# Patient Record
Sex: Male | Born: 1950 | Race: White | Hispanic: No | Marital: Married | Smoking: Former smoker
Health system: Southern US, Community
[De-identification: ages and names within clinical notes are randomized; demographics above are authoritative.]

## PROBLEM LIST (undated history)

## (undated) DIAGNOSIS — Z87898 Personal history of other specified conditions: Secondary | ICD-10-CM

## (undated) DIAGNOSIS — G629 Polyneuropathy, unspecified: Secondary | ICD-10-CM

## (undated) DIAGNOSIS — N183 Chronic kidney disease, stage 3 unspecified: Secondary | ICD-10-CM

## (undated) DIAGNOSIS — I10 Essential (primary) hypertension: Secondary | ICD-10-CM

## (undated) DIAGNOSIS — M1990 Unspecified osteoarthritis, unspecified site: Secondary | ICD-10-CM

## (undated) DIAGNOSIS — D649 Anemia, unspecified: Secondary | ICD-10-CM

## (undated) DIAGNOSIS — M549 Dorsalgia, unspecified: Secondary | ICD-10-CM

## (undated) DIAGNOSIS — I2510 Atherosclerotic heart disease of native coronary artery without angina pectoris: Secondary | ICD-10-CM

## (undated) DIAGNOSIS — Z972 Presence of dental prosthetic device (complete) (partial): Secondary | ICD-10-CM

## (undated) DIAGNOSIS — I739 Peripheral vascular disease, unspecified: Secondary | ICD-10-CM

## (undated) DIAGNOSIS — E785 Hyperlipidemia, unspecified: Secondary | ICD-10-CM

## (undated) DIAGNOSIS — I5189 Other ill-defined heart diseases: Secondary | ICD-10-CM

## (undated) HISTORY — PX: SHOULDER SURGERY: SHX246

## (undated) HISTORY — DX: Chronic kidney disease, stage 3 unspecified: N18.30

## (undated) HISTORY — DX: Personal history of other specified conditions: Z87.898

## (undated) HISTORY — PX: PROSTATE SURGERY: SHX751

## (undated) HISTORY — DX: Hyperlipidemia, unspecified: E78.5

## (undated) HISTORY — PX: VASECTOMY: SHX75

## (undated) HISTORY — DX: Atherosclerotic heart disease of native coronary artery without angina pectoris: I25.10

## (undated) HISTORY — DX: Peripheral vascular disease, unspecified: I73.9

## (undated) HISTORY — PX: BASAL CELL CARCINOMA EXCISION: SHX1214

## (undated) HISTORY — DX: Other ill-defined heart diseases: I51.89

---

## 2010-06-16 ENCOUNTER — Ambulatory Visit: Payer: Self-pay | Admitting: Family Medicine

## 2011-12-22 ENCOUNTER — Ambulatory Visit: Payer: Self-pay | Admitting: Family Medicine

## 2012-01-04 ENCOUNTER — Other Ambulatory Visit: Payer: Self-pay | Admitting: Neurosurgery

## 2012-01-05 ENCOUNTER — Encounter (HOSPITAL_COMMUNITY): Payer: Self-pay | Admitting: Respiratory Therapy

## 2012-01-07 ENCOUNTER — Ambulatory Visit (HOSPITAL_COMMUNITY)
Admission: RE | Admit: 2012-01-07 | Discharge: 2012-01-07 | Disposition: A | Discharge status: Home / Self Care | Payer: BC Managed Care – PPO | Source: Ambulatory Visit | Attending: Neurosurgery | Admitting: Neurosurgery

## 2012-01-07 ENCOUNTER — Other Ambulatory Visit: Payer: Self-pay

## 2012-01-07 ENCOUNTER — Encounter (HOSPITAL_COMMUNITY): Payer: Self-pay

## 2012-01-07 ENCOUNTER — Encounter (HOSPITAL_COMMUNITY)
Admission: RE | Admit: 2012-01-07 | Discharge: 2012-01-07 | Disposition: A | Discharge status: Home / Self Care | Payer: BC Managed Care – PPO | Source: Ambulatory Visit | Attending: Neurosurgery | Admitting: Neurosurgery

## 2012-01-07 DIAGNOSIS — J449 Chronic obstructive pulmonary disease, unspecified: Secondary | ICD-10-CM | POA: Insufficient documentation

## 2012-01-07 DIAGNOSIS — Z01818 Encounter for other preprocedural examination: Secondary | ICD-10-CM | POA: Insufficient documentation

## 2012-01-07 DIAGNOSIS — J4489 Other specified chronic obstructive pulmonary disease: Secondary | ICD-10-CM | POA: Insufficient documentation

## 2012-01-07 DIAGNOSIS — Z01812 Encounter for preprocedural laboratory examination: Secondary | ICD-10-CM | POA: Insufficient documentation

## 2012-01-07 DIAGNOSIS — Z01810 Encounter for preprocedural cardiovascular examination: Secondary | ICD-10-CM | POA: Insufficient documentation

## 2012-01-07 HISTORY — DX: Essential (primary) hypertension: I10

## 2012-01-07 HISTORY — DX: Dorsalgia, unspecified: M54.9

## 2012-01-07 HISTORY — DX: Polyneuropathy, unspecified: G62.9

## 2012-01-07 LAB — CBC
HCT: 40.8 % (ref 39.0–52.0)
Hemoglobin: 14.4 g/dL (ref 13.0–17.0)
MCH: 31.3 pg (ref 26.0–34.0)
MCHC: 35.3 g/dL (ref 30.0–36.0)
MCV: 88.7 fL (ref 78.0–100.0)
Platelets: 178 10*3/uL (ref 150–400)
RBC: 4.6 MIL/uL (ref 4.22–5.81)
RDW: 12.9 % (ref 11.5–15.5)
WBC: 9.1 10*3/uL (ref 4.0–10.5)

## 2012-01-07 LAB — SURGICAL PCR SCREEN
MRSA, PCR: NEGATIVE
Staphylococcus aureus: NEGATIVE

## 2012-01-07 LAB — BASIC METABOLIC PANEL
BUN: 26 mg/dL — ABNORMAL HIGH (ref 6–23)
CO2: 23 mEq/L (ref 19–32)
Calcium: 9.6 mg/dL (ref 8.4–10.5)
Chloride: 100 mEq/L (ref 96–112)
Creatinine, Ser: 0.96 mg/dL (ref 0.50–1.35)
GFR calc Af Amer: >90 mL/min (ref >90)
GFR calc non Af Amer: 88 mL/min — ABNORMAL LOW (ref >90)
Glucose, Bld: 171 mg/dL — ABNORMAL HIGH (ref 70–99)
Potassium: 4.8 mEq/L (ref 3.5–5.1)
Sodium: 134 mEq/L — ABNORMAL LOW (ref 135–145)

## 2012-01-07 NOTE — Pre-Procedure Instructions (Signed)
[place]  01/07/2012   Your procedure is scheduled on:  March 4  Report to [place] at 0900 AM.  Call this number if you have problems the morning of surgery: [phone]   Remember:   Do not eat food:After Midnight.  May have clear liquids: up to 4 Hours before arrival.  Clear liquids include soda, tea, black coffee, apple or grape juice, broth.  Take these medicines the morning of surgery with A SIP OF WATER: none   Do not wear jewelry, make-up or nail polish.  Do not wear lotions, powders, or perfumes. You may wear deodorant.  Do not shave 48 hours prior to surgery.  Do not bring valuables to the hospital.  Contacts, dentures or bridgework may not be worn into surgery.  Leave suitcase in the car. After surgery it may be brought to your room.  For patients admitted to the hospital, checkout time is 11:00 AM the day of discharge.   Patients discharged the day of surgery will not be allowed to drive home.  Name and phone number of your driver: spouse  Special Instructions: CHG Shower Use Special Wash: 1/2 bottle night before surgery and 1/2 bottle morning of surgery.   Please read over the following fact sheets that you were given: MRSA Information and Surgical Site Infection Prevention

## 2012-01-09 MED ORDER — CEFAZOLIN SODIUM 1-5 GM-% IV SOLN: 1.0000 g | INTRAVENOUS | Status: DC

## 2012-01-10 ENCOUNTER — Encounter (HOSPITAL_COMMUNITY): Payer: Self-pay | Admitting: *Deleted

## 2012-01-10 ENCOUNTER — Encounter (HOSPITAL_COMMUNITY)
Admission: RE | Disposition: A | Discharge status: Home / Self Care | Payer: Self-pay | Source: Ambulatory Visit | Attending: Neurosurgery

## 2012-01-10 ENCOUNTER — Ambulatory Visit (HOSPITAL_COMMUNITY)
Admission: RE | Admit: 2012-01-10 | Discharge: 2012-01-11 | Disposition: A | Discharge status: Home / Self Care | Payer: BC Managed Care – PPO | Source: Ambulatory Visit | Attending: Neurosurgery | Admitting: Neurosurgery

## 2012-01-10 ENCOUNTER — Ambulatory Visit (HOSPITAL_COMMUNITY): Payer: BC Managed Care – PPO | Admitting: *Deleted

## 2012-01-10 ENCOUNTER — Ambulatory Visit (HOSPITAL_COMMUNITY): Payer: BC Managed Care – PPO

## 2012-01-10 DIAGNOSIS — Z01818 Encounter for other preprocedural examination: Secondary | ICD-10-CM | POA: Insufficient documentation

## 2012-01-10 DIAGNOSIS — Z79899 Other long term (current) drug therapy: Secondary | ICD-10-CM | POA: Insufficient documentation

## 2012-01-10 DIAGNOSIS — M47817 Spondylosis without myelopathy or radiculopathy, lumbosacral region: Secondary | ICD-10-CM | POA: Insufficient documentation

## 2012-01-10 DIAGNOSIS — E119 Type 2 diabetes mellitus without complications: Secondary | ICD-10-CM | POA: Insufficient documentation

## 2012-01-10 DIAGNOSIS — G5780 Other specified mononeuropathies of unspecified lower limb: Secondary | ICD-10-CM | POA: Insufficient documentation

## 2012-01-10 DIAGNOSIS — M5137 Other intervertebral disc degeneration, lumbosacral region: Secondary | ICD-10-CM | POA: Insufficient documentation

## 2012-01-10 DIAGNOSIS — Z01812 Encounter for preprocedural laboratory examination: Secondary | ICD-10-CM | POA: Insufficient documentation

## 2012-01-10 DIAGNOSIS — I10 Essential (primary) hypertension: Secondary | ICD-10-CM | POA: Insufficient documentation

## 2012-01-10 DIAGNOSIS — F17200 Nicotine dependence, unspecified, uncomplicated: Secondary | ICD-10-CM | POA: Insufficient documentation

## 2012-01-10 DIAGNOSIS — M51379 Other intervertebral disc degeneration, lumbosacral region without mention of lumbar back pain or lower extremity pain: Secondary | ICD-10-CM | POA: Insufficient documentation

## 2012-01-10 DIAGNOSIS — R339 Retention of urine, unspecified: Secondary | ICD-10-CM | POA: Insufficient documentation

## 2012-01-10 DIAGNOSIS — Z01810 Encounter for preprocedural cardiovascular examination: Secondary | ICD-10-CM | POA: Insufficient documentation

## 2012-01-10 HISTORY — PX: LUMBAR LAMINECTOMY/DECOMPRESSION MICRODISCECTOMY: SHX5026

## 2012-01-10 LAB — GLUCOSE, CAPILLARY
Glucose-Capillary: 198 mg/dL — ABNORMAL HIGH (ref 70–99)
Glucose-Capillary: 141 mg/dL — ABNORMAL HIGH (ref 70–99)

## 2012-01-10 SURGERY — LUMBAR LAMINECTOMY/DECOMPRESSION MICRODISCECTOMY 2 LEVELS
Anesthesia: General | Site: Spine Lumbar | Laterality: Bilateral | Wound class: Clean

## 2012-01-10 MED ORDER — SODIUM CHLORIDE 0.9 % IV SOLN
INTRAVENOUS | Status: AC
  Filled 2012-01-10: qty 500

## 2012-01-10 MED ORDER — INSULIN ASPART 100 UNIT/ML ~~LOC~~ SOLN
0.0000 [IU] | Freq: Every day | SUBCUTANEOUS | Status: DC
  Administered 2012-01-10: 2 [IU] via SUBCUTANEOUS

## 2012-01-10 MED ORDER — ONDANSETRON HCL 4 MG/2ML IJ SOLN: 4.0000 mg | Freq: Once | INTRAMUSCULAR | Status: DC | PRN

## 2012-01-10 MED ORDER — LIDOCAINE-EPINEPHRINE 1 %-1:100000 IJ SOLN
INTRAMUSCULAR | Status: DC | PRN
  Administered 2012-01-10: 10 mL

## 2012-01-10 MED ORDER — MENTHOL 3 MG MT LOZG: 1.0000 | LOZENGE | OROMUCOSAL | Status: DC | PRN

## 2012-01-10 MED ORDER — THROMBIN 5000 UNITS EX KIT
PACK | CUTANEOUS | Status: DC | PRN
  Administered 2012-01-10 (×2): 5000 [IU] via TOPICAL

## 2012-01-10 MED ORDER — HYDROCODONE-ACETAMINOPHEN 5-325 MG PO TABS: 1.0000 | ORAL_TABLET | ORAL | Status: DC | PRN

## 2012-01-10 MED ORDER — GLYCOPYRROLATE 0.2 MG/ML IJ SOLN
INTRAMUSCULAR | Status: DC | PRN
  Administered 2012-01-10: .7 mg via INTRAVENOUS

## 2012-01-10 MED ORDER — PHENOL 1.4 % MT LIQD: 1.0000 | OROMUCOSAL | Status: DC | PRN

## 2012-01-10 MED ORDER — HYDROMORPHONE HCL PF 1 MG/ML IJ SOLN
INTRAMUSCULAR | Status: AC
  Filled 2012-01-10: qty 1

## 2012-01-10 MED ORDER — SODIUM CHLORIDE 0.9 % IJ SOLN: 3.0000 mL | INTRAMUSCULAR | Status: DC | PRN

## 2012-01-10 MED ORDER — LINAGLIPTIN 5 MG PO TABS
5.0000 mg | ORAL_TABLET | Freq: Every day | ORAL | Status: DC
  Administered 2012-01-10 – 2012-01-11 (×2): 5 mg via ORAL
  Filled 2012-01-10 (×2): qty 1

## 2012-01-10 MED ORDER — ONDANSETRON HCL 4 MG/2ML IJ SOLN
INTRAMUSCULAR | Status: DC | PRN
  Administered 2012-01-10: 4 mg via INTRAVENOUS

## 2012-01-10 MED ORDER — ALBUTEROL SULFATE (2.5 MG/3ML) 0.083% IN NEBU
INHALATION_SOLUTION | RESPIRATORY_TRACT | Status: DC | PRN
  Administered 2012-01-10: 2.5 mg via RESPIRATORY_TRACT
  Administered 2012-01-10: 1 mg via RESPIRATORY_TRACT

## 2012-01-10 MED ORDER — POTASSIUM CHLORIDE IN NACL 20-0.9 MEQ/L-% IV SOLN
INTRAVENOUS | Status: DC
  Filled 2012-01-10 (×4): qty 1000

## 2012-01-10 MED ORDER — INSULIN ASPART 100 UNIT/ML ~~LOC~~ SOLN
0.0000 [IU] | Freq: Three times a day (TID) | SUBCUTANEOUS | Status: DC
  Administered 2012-01-10: 3 [IU] via SUBCUTANEOUS
  Administered 2012-01-11: 8 [IU] via SUBCUTANEOUS
  Administered 2012-01-11: 3 [IU] via SUBCUTANEOUS
  Filled 2012-01-10: qty 3

## 2012-01-10 MED ORDER — HYDROXYZINE HCL 25 MG PO TABS: 50.0000 mg | ORAL_TABLET | ORAL | Status: DC | PRN

## 2012-01-10 MED ORDER — ALUM & MAG HYDROXIDE-SIMETH 200-200-20 MG/5ML PO SUSP: 30.0000 mL | Freq: Four times a day (QID) | ORAL | Status: DC | PRN

## 2012-01-10 MED ORDER — ROCURONIUM BROMIDE 100 MG/10ML IV SOLN
INTRAVENOUS | Status: DC | PRN
  Administered 2012-01-10: 50 mg via INTRAVENOUS

## 2012-01-10 MED ORDER — ZOLPIDEM TARTRATE 5 MG PO TABS: 10.0000 mg | ORAL_TABLET | Freq: Every evening | ORAL | Status: DC | PRN

## 2012-01-10 MED ORDER — LACTATED RINGERS IV SOLN
INTRAVENOUS | Status: DC | PRN
  Administered 2012-01-10 (×3): via INTRAVENOUS

## 2012-01-10 MED ORDER — MAGNESIUM HYDROXIDE 400 MG/5ML PO SUSP: 30.0000 mL | Freq: Every day | ORAL | Status: DC | PRN

## 2012-01-10 MED ORDER — MIDAZOLAM HCL 5 MG/5ML IJ SOLN
INTRAMUSCULAR | Status: DC | PRN
  Administered 2012-01-10: 2 mg via INTRAVENOUS

## 2012-01-10 MED ORDER — CYCLOBENZAPRINE HCL 10 MG PO TABS
10.0000 mg | ORAL_TABLET | Freq: Three times a day (TID) | ORAL | Status: DC | PRN
  Administered 2012-01-11: 10 mg via ORAL
  Filled 2012-01-10: qty 1

## 2012-01-10 MED ORDER — VECURONIUM BROMIDE 10 MG IV SOLR
INTRAVENOUS | Status: DC | PRN
  Administered 2012-01-10 (×2): 2 mg via INTRAVENOUS
  Administered 2012-01-10: 5 mg via INTRAVENOUS

## 2012-01-10 MED ORDER — BACITRACIN 50000 UNITS IM SOLR
INTRAMUSCULAR | Status: AC
  Filled 2012-01-10: qty 1

## 2012-01-10 MED ORDER — METFORMIN HCL 500 MG PO TABS
1000.0000 mg | ORAL_TABLET | Freq: Two times a day (BID) | ORAL | Status: DC
  Administered 2012-01-10 – 2012-01-11 (×3): 1000 mg via ORAL
  Filled 2012-01-10 (×5): qty 2

## 2012-01-10 MED ORDER — HYDROMORPHONE HCL PF 1 MG/ML IJ SOLN
0.2500 mg | INTRAMUSCULAR | Status: DC | PRN
  Administered 2012-01-10 (×2): 0.5 mg via INTRAVENOUS

## 2012-01-10 MED ORDER — ACETAMINOPHEN 10 MG/ML IV SOLN
INTRAVENOUS | Status: AC
  Administered 2012-01-10: 1000 mg via INTRAVENOUS
  Filled 2012-01-10: qty 100

## 2012-01-10 MED ORDER — ACETAMINOPHEN 650 MG RE SUPP: 650.0000 mg | RECTAL | Status: DC | PRN

## 2012-01-10 MED ORDER — MORPHINE SULFATE 2 MG/ML IJ SOLN: 0.0500 mg/kg | INTRAMUSCULAR | Status: DC | PRN

## 2012-01-10 MED ORDER — CEFAZOLIN SODIUM-DEXTROSE 2-3 GM-% IV SOLR
INTRAVENOUS | Status: AC
  Filled 2012-01-10: qty 50

## 2012-01-10 MED ORDER — OXYCODONE-ACETAMINOPHEN 5-325 MG PO TABS
1.0000 | ORAL_TABLET | ORAL | Status: DC | PRN
  Administered 2012-01-10 – 2012-01-11 (×3): 2 via ORAL
  Filled 2012-01-10 (×3): qty 2

## 2012-01-10 MED ORDER — HYDROXYZINE HCL 50 MG/ML IM SOLN
50.0000 mg | INTRAMUSCULAR | Status: DC | PRN
  Administered 2012-01-10: 50 mg via INTRAMUSCULAR
  Filled 2012-01-10: qty 1

## 2012-01-10 MED ORDER — KETOROLAC TROMETHAMINE 30 MG/ML IJ SOLN
30.0000 mg | Freq: Four times a day (QID) | INTRAMUSCULAR | Status: DC
  Administered 2012-01-10 – 2012-01-11 (×3): 30 mg via INTRAVENOUS
  Filled 2012-01-10 (×6): qty 1

## 2012-01-10 MED ORDER — KETOROLAC TROMETHAMINE 30 MG/ML IJ SOLN
INTRAMUSCULAR | Status: AC
  Administered 2012-01-10: 30 mg
  Filled 2012-01-10: qty 1

## 2012-01-10 MED ORDER — ENALAPRIL MALEATE 20 MG PO TABS
20.0000 mg | ORAL_TABLET | Freq: Two times a day (BID) | ORAL | Status: DC
  Administered 2012-01-11: 20 mg via ORAL
  Filled 2012-01-10 (×4): qty 1

## 2012-01-10 MED ORDER — FENTANYL CITRATE 0.05 MG/ML IJ SOLN
INTRAMUSCULAR | Status: DC | PRN
  Administered 2012-01-10 (×4): 50 ug via INTRAVENOUS
  Administered 2012-01-10: 150 ug via INTRAVENOUS
  Administered 2012-01-10: 50 ug via INTRAVENOUS

## 2012-01-10 MED ORDER — HEMOSTATIC AGENTS (NO CHARGE) OPTIME
TOPICAL | Status: DC | PRN
  Administered 2012-01-10: 1 via TOPICAL

## 2012-01-10 MED ORDER — KETOROLAC TROMETHAMINE 30 MG/ML IJ SOLN
30.0000 mg | Freq: Once | INTRAMUSCULAR | Status: AC
  Administered 2012-01-10: 30 mg via INTRAVENOUS

## 2012-01-10 MED ORDER — PROPOFOL 10 MG/ML IV EMUL
INTRAVENOUS | Status: DC | PRN
  Administered 2012-01-10: 120 mg via INTRAVENOUS

## 2012-01-10 MED ORDER — CEFAZOLIN SODIUM-DEXTROSE 2-3 GM-% IV SOLR
2.0000 g | INTRAVENOUS | Status: AC
  Administered 2012-01-10: 2 g via INTRAVENOUS

## 2012-01-10 MED ORDER — HYDROCHLOROTHIAZIDE 25 MG PO TABS
25.0000 mg | ORAL_TABLET | Freq: Every day | ORAL | Status: DC
  Administered 2012-01-10 – 2012-01-11 (×2): 25 mg via ORAL
  Filled 2012-01-10 (×2): qty 1

## 2012-01-10 MED ORDER — NEOSTIGMINE METHYLSULFATE 1 MG/ML IJ SOLN
INTRAMUSCULAR | Status: DC | PRN
  Administered 2012-01-10: 4 mg via INTRAVENOUS

## 2012-01-10 MED ORDER — SODIUM CHLORIDE 0.9 % IJ SOLN
3.0000 mL | Freq: Two times a day (BID) | INTRAMUSCULAR | Status: DC
  Administered 2012-01-10: 3 mL via INTRAVENOUS

## 2012-01-10 MED ORDER — GLIMEPIRIDE 4 MG PO TABS
4.0000 mg | ORAL_TABLET | Freq: Every day | ORAL | Status: DC
  Administered 2012-01-11: 4 mg via ORAL
  Filled 2012-01-10 (×2): qty 1

## 2012-01-10 MED ORDER — MORPHINE SULFATE 4 MG/ML IJ SOLN: 4.0000 mg | INTRAMUSCULAR | Status: DC | PRN

## 2012-01-10 MED ORDER — ACETAMINOPHEN 325 MG PO TABS: 650.0000 mg | ORAL_TABLET | ORAL | Status: DC | PRN

## 2012-01-10 MED ORDER — MEPERIDINE HCL 25 MG/ML IJ SOLN: 6.2500 mg | INTRAMUSCULAR | Status: DC | PRN

## 2012-01-10 MED ORDER — BUPIVACAINE HCL (PF) 0.5 % IJ SOLN
INTRAMUSCULAR | Status: DC | PRN
  Administered 2012-01-10: 10 mL

## 2012-01-10 MED ORDER — BACITRACIN 50000 UNITS IM SOLR
INTRAMUSCULAR | Status: DC | PRN
  Administered 2012-01-10: 13:00:00

## 2012-01-10 MED ORDER — 0.9 % SODIUM CHLORIDE (POUR BTL) OPTIME
TOPICAL | Status: DC | PRN
  Administered 2012-01-10: 1000 mL

## 2012-01-10 MED ORDER — BISACODYL 10 MG RE SUPP: 10.0000 mg | Freq: Every day | RECTAL | Status: DC | PRN

## 2012-01-10 SURGICAL SUPPLY — 62 items
BAG DECANTER FOR FLEXI CONT (MISCELLANEOUS) ×2 IMPLANT
BENZOIN TINCTURE PRP APPL 2/3 (GAUZE/BANDAGES/DRESSINGS) IMPLANT
BLADE SURG ROTATE 9660 (MISCELLANEOUS) IMPLANT
BRUSH SCRUB EZ PLAIN DRY (MISCELLANEOUS) ×2 IMPLANT
BUR ACORN 6.0 ACORN (BURR) IMPLANT
BUR ACRON 5.0MM COATED (BURR) ×2 IMPLANT
BUR MATCHSTICK NEURO 3.0 LAGG (BURR) ×2 IMPLANT
CANISTER SUCTION 2500CC (MISCELLANEOUS) ×2 IMPLANT
CLOTH BEACON ORANGE TIMEOUT ST (SAFETY) ×2 IMPLANT
CONT SPEC 4OZ CLIKSEAL STRL BL (MISCELLANEOUS) IMPLANT
DERMABOND ADVANCED (GAUZE/BANDAGES/DRESSINGS) ×2
DERMABOND ADVANCED .7 DNX12 (GAUZE/BANDAGES/DRESSINGS) ×2 IMPLANT
DRAPE LAPAROTOMY 100X72X124 (DRAPES) ×2 IMPLANT
DRAPE MICROSCOPE LEICA (MISCELLANEOUS) ×2 IMPLANT
DRAPE POUCH INSTRU U-SHP 10X18 (DRAPES) ×2 IMPLANT
DRSG EMULSION OIL 3X3 NADH (GAUZE/BANDAGES/DRESSINGS) IMPLANT
ELECT REM PT RETURN 9FT ADLT (ELECTROSURGICAL) ×2
ELECTRODE REM PT RTRN 9FT ADLT (ELECTROSURGICAL) ×1 IMPLANT
GAUZE SPONGE 4X4 12PLY STRL LF (GAUZE/BANDAGES/DRESSINGS) ×2 IMPLANT
GAUZE SPONGE 4X4 16PLY XRAY LF (GAUZE/BANDAGES/DRESSINGS) IMPLANT
GLOVE BIO SURGEON STRL SZ8.5 (GLOVE) ×2 IMPLANT
GLOVE BIOGEL PI IND STRL 7.5 (GLOVE) ×2 IMPLANT
GLOVE BIOGEL PI IND STRL 8 (GLOVE) ×1 IMPLANT
GLOVE BIOGEL PI INDICATOR 7.5 (GLOVE) ×2
GLOVE BIOGEL PI INDICATOR 8 (GLOVE) ×1
GLOVE ECLIPSE 7.5 STRL STRAW (GLOVE) ×6 IMPLANT
GLOVE EXAM NITRILE LRG STRL (GLOVE) IMPLANT
GLOVE EXAM NITRILE MD LF STRL (GLOVE) ×2 IMPLANT
GLOVE EXAM NITRILE XL STR (GLOVE) IMPLANT
GLOVE EXAM NITRILE XS STR PU (GLOVE) IMPLANT
GLOVE SS BIOGEL STRL SZ 7 (GLOVE) ×2 IMPLANT
GLOVE SS BIOGEL STRL SZ 8 (GLOVE) ×1 IMPLANT
GLOVE SUPERSENSE BIOGEL SZ 7 (GLOVE) ×2
GLOVE SUPERSENSE BIOGEL SZ 8 (GLOVE) ×1
GOWN BRE IMP SLV AUR LG STRL (GOWN DISPOSABLE) ×4 IMPLANT
GOWN BRE IMP SLV AUR XL STRL (GOWN DISPOSABLE) ×4 IMPLANT
GOWN STRL REIN 2XL LVL4 (GOWN DISPOSABLE) IMPLANT
KIT BASIN OR (CUSTOM PROCEDURE TRAY) ×2 IMPLANT
KIT ROOM TURNOVER OR (KITS) ×2 IMPLANT
NEEDLE HYPO 18GX1.5 BLUNT FILL (NEEDLE) IMPLANT
NEEDLE SPNL 18GX3.5 QUINCKE PK (NEEDLE) ×4 IMPLANT
NEEDLE SPNL 22GX3.5 QUINCKE BK (NEEDLE) ×2 IMPLANT
NS IRRIG 1000ML POUR BTL (IV SOLUTION) ×2 IMPLANT
PACK LAMINECTOMY NEURO (CUSTOM PROCEDURE TRAY) ×2 IMPLANT
PAD ARMBOARD 7.5X6 YLW CONV (MISCELLANEOUS) ×6 IMPLANT
PATTIES SURGICAL .5 X1 (DISPOSABLE) ×2 IMPLANT
RUBBERBAND STERILE (MISCELLANEOUS) ×4 IMPLANT
SPONGE GAUZE 4X4 12PLY (GAUZE/BANDAGES/DRESSINGS) IMPLANT
SPONGE LAP 4X18 X RAY DECT (DISPOSABLE) IMPLANT
SPONGE SURGIFOAM ABS GEL SZ50 (HEMOSTASIS) ×2 IMPLANT
STRIP CLOSURE SKIN 1/2X4 (GAUZE/BANDAGES/DRESSINGS) IMPLANT
SUT PROLENE 6 0 BV (SUTURE) IMPLANT
SUT VIC AB 1 CT1 18XBRD ANBCTR (SUTURE) ×2 IMPLANT
SUT VIC AB 1 CT1 8-18 (SUTURE) ×2
SUT VIC AB 2-0 CP2 18 (SUTURE) ×4 IMPLANT
SUT VIC AB 3-0 SH 8-18 (SUTURE) ×2 IMPLANT
SYR 20CC LL (SYRINGE) ×2 IMPLANT
SYR 5ML LL (SYRINGE) IMPLANT
TAPE HYPAFIX 4 X10 (GAUZE/BANDAGES/DRESSINGS) ×2 IMPLANT
TOWEL OR 17X24 6PK STRL BLUE (TOWEL DISPOSABLE) ×2 IMPLANT
TOWEL OR 17X26 10 PK STRL BLUE (TOWEL DISPOSABLE) ×2 IMPLANT
WATER STERILE IRR 1000ML POUR (IV SOLUTION) ×2 IMPLANT

## 2012-01-10 NOTE — H&P (Signed)
Subjective: Patient is a 61 y.o. male who is admitted for treatment of multilevel multifactorial lumbar stenosis with resultant back pain and bilateral lower extremity pain, left worse than right. He was treated with epidural steroid injections without relief. X-rays show 12 sets of ribs, 6 lumbar type vertebra numbered L1-S1, there is marked disc space narrowing at L2-3, otherwise mild degenerative disc disease and spondylosis. MRI shows degeneration at the L2-3 level but only mild canal stenosis, the most significant changes are seen at the L4-5 and L5-S1 levels where there is multifactorial multilevel lumbar stenosis secondary to degenerative disc disease and spondylosis with facet and ligamentum hypertrophy and circumferential disc bulging. Overall the stenosis is severe at L5-S1 and marked at L4-5.   Past Medical History  Diagnosis Date  . Diabetes mellitus   . Hypertension   . Neuropathy     feet  . Back pain     leg pain    Past Surgical History  Procedure Date  . Shoulder surgery   . Basal cell carcinoma excision     Prescriptions prior to admission  Medication Sig Dispense Refill  . enalapril (VASOTEC) 20 MG tablet Take 20 mg by mouth 2 (two) times daily.      Marland Kitchen glimepiride (AMARYL) 4 MG tablet Take 4 mg by mouth daily before breakfast.      . hydrochlorothiazide (HYDRODIURIL) 25 MG tablet Take 25 mg by mouth daily.      . metFORMIN (GLUCOPHAGE) 1000 MG tablet Take 1,000 mg by mouth 2 (two) times daily with a meal.      . sitaGLIPtin (JANUVIA) 100 MG tablet Take 100 mg by mouth daily.       Allergies  Allergen Reactions  . Codeine Itching    And hyper also    History  Substance Use Topics  . Smoking status: Current Everyday Smoker -- 2.0 packs/day for 45 years  . Smokeless tobacco: Not on file  . Alcohol Use: No    History reviewed. No pertinent family history.   Review of Systems A comprehensive review of systems was negative.  Objective: Vital signs in last 24  hours: Temp:  [97.9 F (36.6 C)] 97.9 F (36.6 C) (03/04 0910) Pulse Rate:  [76] 76  (03/04 0910) Resp:  [18] 18  (03/04 0910) BP: (168)/(71) 168/71 mmHg (03/04 0910) SpO2:  [97 %] 97 % (03/04 0910)  EXAM: Patient is a well-developed well-nourished white male in no acute distress. Lungs are clear to auscultation , the patient has symmetrical respiratory excursion. Heart has a regular rate and rhythm normal S1 and S2 no murmur.   Abdomen is soft nontender nondistended bowel sounds are present. Extremity examination shows no clubbing cyanosis or edema. Musculoskeletal examination shows tenderness to palpation in the mid lumbar region. Flexion is limited at about 60. He is able to extend to about 10. Straight leg raising is negative bilaterally.  Motor examination shows 5 over 5 strength in the lower extremities including the iliopsoas quadriceps dorsiflexor extensor hallicus  longus and plantar flexor bilaterally. Sensation is intact to pinprick in the distal lower extremities. Reflexes are symmetrical bilaterally. No pathologic reflexes are present. Patient has a normal gait and stance.  Data Review:CBC    Component Value Date/Time   WBC 9.1 01/07/2012 1338   RBC 4.60 01/07/2012 1338   HGB 14.4 01/07/2012 1338   HCT 40.8 01/07/2012 1338   PLT 178 01/07/2012 1338   MCV 88.7 01/07/2012 1338   MCH 31.3 01/07/2012 1338  MCHC 35.3 01/07/2012 1338   RDW 12.9 01/07/2012 1338                          BMET    Component Value Date/Time   NA 134* 01/07/2012 1338   K 4.8 01/07/2012 1338   CL 100 01/07/2012 1338   CO2 23 01/07/2012 1338   GLUCOSE 171* 01/07/2012 1338   BUN 26* 01/07/2012 1338   CREATININE 0.96 01/07/2012 1338   CALCIUM 9.6 01/07/2012 1338   GFRNONAA 88* 01/07/2012 1338   GFRAA >90 01/07/2012 1338     Assessment/Plan: Patient is admitted now for a lumbar decompression via laminectomy from L4-S1. I've discussed with the patient the nature of his condition, the nature the surgical procedure, the typical  length of surgery, hospital stay, and overall recuperation. We discussed limitations postoperatively. I discussed risks of surgery including risks of infection, bleeding, possibly need for transfusion, the risk of nerve root dysfunction with pain, weakness, numbness, or paresthesias, or risk of dural tear and CSF leakage and possible need for further surgery, and the risk of anesthetic complications including myocardial infarction, stroke, pneumonia, and death. Understanding all this the patient does wish to proceed with surgery and is admitted for such.    Hosie Spangle, MD 01/10/2012 11:52 AM

## 2012-01-10 NOTE — Progress Notes (Signed)
Filed Vitals:   01/10/12 1551 01/10/12 1600 01/10/12 1625 01/10/12 1656  BP: 142/44  155/60 157/61  Pulse: 64 65 60 62  Temp:  97.9 F (36.6 C) 97.6 F (36.4 C) 98.5 F (36.9 C)  TempSrc:   Oral Oral  Resp: 10 11 18 20   Weight:      SpO2: 96% 96% 96% 97%    Patient resting comfortably in bed. Has been out of bed to go to the bathroom, but has not been able to void. He does feel that he needs to void, and we'll check a bladder scan to see what his bladder volume is. I've encouraged the patient and laid in the halls. Nursing staff has had to reinforce his dressing and we'll probably plan on changing it in the morning.  Plan: We'll plan on progressing as feasible.  Hosie Spangle, MD 01/10/2012, 6:48 PM

## 2012-01-10 NOTE — Transfer of Care (Signed)
Immediate Anesthesia Transfer of Care Note  Patient: Nicholas Nolan  Procedure(s) Performed: Procedure(s) (LRB): LUMBAR LAMINECTOMY/DECOMPRESSION MICRODISCECTOMY 2 LEVELS (Bilateral)  Patient Location: PACU  Anesthesia Type: General  Level of Consciousness: awake and alert   Airway & Oxygen Therapy: Patient Spontanous Breathing and Patient connected to nasal cannula oxygen  Post-op Assessment: Report given to PACU RN  Post vital signs: Reviewed and stable  Complications: No apparent anesthesia complications

## 2012-01-10 NOTE — Anesthesia Preprocedure Evaluation (Signed)
Anesthesia Evaluation  Patient identified by MRN, date of birth, ID band Patient awake    Reviewed: Allergy & Precautions, H&P , NPO status , Patient's Chart, lab work & pertinent test results  Airway       Dental   Pulmonary          Cardiovascular hypertension, Pt. on medications     Neuro/Psych    GI/Hepatic   Endo/Other  Diabetes mellitus-, Well Controlled, Type 2, Oral Hypoglycemic Agents  Renal/GU      Musculoskeletal   Abdominal   Peds  Hematology   Anesthesia Other Findings   Reproductive/Obstetrics                           Anesthesia Physical Anesthesia Plan  ASA: II  Anesthesia Plan: General   Post-op Pain Management:    Induction: Intravenous  Airway Management Planned: Oral ETT  Additional Equipment:   Intra-op Plan:   Post-operative Plan: Extubation in OR  Informed Consent: I have reviewed the patients History and Physical, chart, labs and discussed the procedure including the risks, benefits and alternatives for the proposed anesthesia with the patient or authorized representative who has indicated his/her understanding and acceptance.     Plan Discussed with: CRNA and Surgeon  Anesthesia Plan Comments:         Anesthesia Quick Evaluation

## 2012-01-10 NOTE — Anesthesia Postprocedure Evaluation (Signed)
Anesthesia Post Note  Patient: Nicholas Nolan  Procedure(s) Performed: Procedure(s) (LRB): LUMBAR LAMINECTOMY/DECOMPRESSION MICRODISCECTOMY 2 LEVELS (Bilateral)  Anesthesia type: general  Patient location: PACU  Post pain: Pain level controlled  Post assessment: Patient's Cardiovascular Status Stable  Last Vitals:  Filed Vitals:   01/10/12 1625  BP: 155/60  Pulse: 60  Temp: 36.4 C  Resp: 18    Post vital signs: Reviewed and stable  Level of consciousness: sedated  Complications: No apparent anesthesia complications

## 2012-01-10 NOTE — Op Note (Signed)
01/10/2012  3:03 PM  PATIENT:  Nicholas Nolan  61 y.o. male  PRE-OPERATIVE DIAGNOSIS:  lumbar stenosis lumbar spondylosis lumbar degenerative disc disease lumbar radiculopathy  POST-OPERATIVE DIAGNOSIS:  Lumbar Stenosis,Lumbar Spondylosis,lumbar degenerative disc disease,lumbar radiculopathy  PROCEDURE:  Procedure(s): LUMBAR LAMINECTOMY/DECOMPRESSION MICRODISCECTOMY 2 LEVELS: L4-S1 decompressive lumbar laminectomy with decompression of the exiting L4, L5, and S1 nerve roots bilaterally with microdissection microsurgical technique  SURGEON:  Surgeon(s): Hosie Spangle, MD Ophelia Charter, MD  ASSISTANTS: Ophelia Charter, M.D.  ANESTHESIA:   general  EBL:  Total I/O In: 2000 [I.V.:2000] Out: 50 [Blood:50]  BLOOD ADMINISTERED:none  COUNT: Correct per nursing staff  DICTATION: Patient was brought to the operating room placed under general endotracheal anesthesia. Patient was turned to a prone position the lumbar region was prepped with Betadine-soaked solution and draped in a sterile fashion. The midline was infiltrated with local anesthetic with epinephrine. A localizing x-ray was taken and then a midline incision was made carried down thru the subcutaneous tissue, bipolar cautery and electrocautery were used to maintain hemostasis. Dissection was carried down to the lumbar fascia which was incised bilaterally and the paraspinal muscles were dissected from the spinous process and lamina in a subperiosteal fashion. Another localizing x-ray was taken and the L4, L5, and S1 levels were identified. Laminectomy was begun with double-action rongeurs a high-speed drill and Kerrison punches. The thickened ligamentum flavum was carefully removed. Dissection was carried laterally to decompress the lateral stenosis taking care to leave the facet complexes intact. Once the decompression was completed hemostasis was established with the use of bipolar cautery and Gelfoam with thrombin.  Paraspinal muscles deep fascia and Scarpa's fascia were closed in separate layers with interrupted 1 undyed Vicryl sutures. The subcutaneous and subcuticular were closed with interrupted inverted 2-0 undyed Vicryl sutures. Skin edges were approximated with Dermabond.   PLAN OF CARE: Admit for overnight observation  PATIENT DISPOSITION:  PACU - hemodynamically stable.   Delay start of Pharmacological VTE agent (>24hrs) due to surgical blood loss or risk of bleeding:  yes

## 2012-01-10 NOTE — Preoperative (Signed)
Beta Blockers   Reason not to administer Beta Blockers:Not Applicable 

## 2012-01-11 LAB — GLUCOSE, CAPILLARY
Glucose-Capillary: 166 mg/dL — ABNORMAL HIGH (ref 70–99)
Glucose-Capillary: 211 mg/dL — ABNORMAL HIGH (ref 70–99)
Glucose-Capillary: 288 mg/dL — ABNORMAL HIGH (ref 70–99)

## 2012-01-11 MED ORDER — TAMSULOSIN HCL 0.4 MG PO CAPS
0.8000 mg | ORAL_CAPSULE | ORAL | Status: AC
  Administered 2012-01-11: 0.8 mg via ORAL
  Filled 2012-01-11: qty 2

## 2012-01-11 MED ORDER — TAMSULOSIN HCL 0.4 MG PO CAPS
0.4000 mg | ORAL_CAPSULE | Freq: Every day | ORAL | Status: DC
  Filled 2012-01-11: qty 1

## 2012-01-11 MED ORDER — CYCLOBENZAPRINE HCL 10 MG PO TABS: 5.0000 mg | ORAL_TABLET | Freq: Three times a day (TID) | ORAL | Status: AC | PRN

## 2012-01-11 MED ORDER — OXYCODONE-ACETAMINOPHEN 5-325 MG PO TABS: 1.0000 | ORAL_TABLET | ORAL | Status: AC | PRN

## 2012-01-11 NOTE — Progress Notes (Signed)
Filed Vitals:   01/10/12 1955 01/10/12 2200 01/11/12 0022 01/11/12 0415  BP: 146/67 111/52 105/52 127/52  Pulse: 66 65 71 74  Temp: 98 F (36.7 C)  98.5 F (36.9 C) 98.7 F (37.1 C)  TempSrc:      Resp: 18  16 18   Weight:      SpO2: 95%  98% 92%    Patient fairly comfortable, but with moderate stiffness. He has been up and living in the halls. Good relief of neurogenic claudication. Dressing dry and intact. We'll have nursing staff change dressing daily. Patient has had difficulty with urinary retention. This is the problem previously for him, and he seen a urologist in [place] in the past. He had been treated in the past with Flomax, but more recently he was treated with finasteride.  Plan: We will start him on Flomax but planning only using it for a few days because he does have side effects from it. He is encouraged to ambulate. The nursing staff will continue to monitor his voiding function.  Hosie Spangle, MD 01/11/2012, 7:35 AM

## 2012-01-11 NOTE — Progress Notes (Signed)
Pt. Voiding minimal amount of urine. Pt. Stated he has no pressure or pain in lower abdominal area. PVR of 130 ml noted. Moving all extremities well and vitals stable and documented. Pt. Ambulating,po intake adequate with adequate po pain control. Rn will continue to monitor Patient's progress.

## 2012-01-11 NOTE — Discharge Summary (Signed)
Physician Discharge Summary  Patient ID: Nicholas Nolan MRN: [id number] DOB/AGE: 1951/08/19 61 y.o.  Admit date: 01/10/2012 Discharge date: 01/11/2012  Admission Diagnoses: Lumbar stenosis   Discharge Diagnoses: Lumbar stenosis  Discharged Condition: good  Hospital Course: Patient was admitted underwent a L4-S1 decompressive lumbar laminectomy. Postoperatively he has done well, but had some difficulty with urinary retention. That has improved and resolved and PVRs at this time are within the normal range. He does have an established relationship with a urologist in [place] and we'll follow up with him if needed. He has been up and ambulate actively in the halls, and he is to continue to ambulate actively when he is home. Wound is clean and dry. He's been given instructions along with his wife for activities and wound care following discharge. He is to return for followup with me in 3 weeks.  Discharge Exam: Blood pressure 113/72, pulse 72, temperature 97.9 F (36.6 C), temperature source Oral, resp. rate 20, weight 105.5 kg (232 lb 9.4 oz), SpO2 94.00%.  Disposition: Home   Medication List  As of 01/11/2012  5:43 PM   TAKE these medications         cyclobenzaprine 10 MG tablet   Commonly known as: FLEXERIL   Take 0.5-1 tablets (5-10 mg total) by mouth 3 (three) times daily as needed for muscle spasms.      enalapril 20 MG tablet   Commonly known as: VASOTEC   Take 20 mg by mouth 2 (two) times daily.      glimepiride 4 MG tablet   Commonly known as: AMARYL   Take 4 mg by mouth daily before breakfast.      hydrochlorothiazide 25 MG tablet   Commonly known as: HYDRODIURIL   Take 25 mg by mouth daily.      metFORMIN 1000 MG tablet   Commonly known as: GLUCOPHAGE   Take 1,000 mg by mouth 2 (two) times daily with a meal.      oxyCODONE-acetaminophen 5-325 MG per tablet   Commonly known as: PERCOCET   Take 1-2 tablets by mouth every 4 (four) hours as needed for pain.     sitaGLIPtin 100 MG tablet   Commonly known as: JANUVIA   Take 100 mg by mouth daily.             Signed: Hosie Spangle, MD 01/11/2012, 5:43 PM

## 2012-01-11 NOTE — Progress Notes (Signed)
In and Out catheter inserted without difficulty.  Return of 429ml clear yellow urine noted.  Patient tolerated procedure well -Rn will continue to monitor

## 2012-01-12 ENCOUNTER — Encounter (HOSPITAL_COMMUNITY): Payer: Self-pay | Admitting: Neurosurgery

## 2012-01-12 LAB — GLUCOSE, CAPILLARY
Glucose-Capillary: 162 mg/dL — ABNORMAL HIGH (ref 70–99)
Glucose-Capillary: 266 mg/dL — ABNORMAL HIGH (ref 70–99)

## 2012-07-27 ENCOUNTER — Emergency Department: Payer: Self-pay | Admitting: Emergency Medicine

## 2012-10-03 ENCOUNTER — Ambulatory Visit: Payer: Self-pay | Admitting: Family Medicine

## 2012-11-08 DIAGNOSIS — C3411 Malignant neoplasm of upper lobe, right bronchus or lung: Secondary | ICD-10-CM

## 2012-11-08 HISTORY — DX: Malignant neoplasm of upper lobe, right bronchus or lung: C34.11

## 2012-11-22 HISTORY — PX: LUNG SURGERY: SHX703

## 2014-08-29 ENCOUNTER — Emergency Department: Payer: Self-pay | Admitting: Emergency Medicine

## 2014-12-09 DIAGNOSIS — E119 Type 2 diabetes mellitus without complications: Secondary | ICD-10-CM | POA: Insufficient documentation

## 2014-12-09 DIAGNOSIS — C3490 Malignant neoplasm of unspecified part of unspecified bronchus or lung: Secondary | ICD-10-CM | POA: Insufficient documentation

## 2014-12-09 DIAGNOSIS — E785 Hyperlipidemia, unspecified: Secondary | ICD-10-CM | POA: Insufficient documentation

## 2014-12-09 DIAGNOSIS — E1122 Type 2 diabetes mellitus with diabetic chronic kidney disease: Secondary | ICD-10-CM | POA: Insufficient documentation

## 2014-12-09 DIAGNOSIS — Z85118 Personal history of other malignant neoplasm of bronchus and lung: Secondary | ICD-10-CM | POA: Insufficient documentation

## 2014-12-09 DIAGNOSIS — E1169 Type 2 diabetes mellitus with other specified complication: Secondary | ICD-10-CM | POA: Insufficient documentation

## 2014-12-27 ENCOUNTER — Ambulatory Visit: Payer: Self-pay | Admitting: Obstetrics and Gynecology

## 2015-04-21 ENCOUNTER — Telehealth: Payer: Self-pay

## 2015-04-21 DIAGNOSIS — E785 Hyperlipidemia, unspecified: Secondary | ICD-10-CM | POA: Insufficient documentation

## 2015-04-21 MED ORDER — ATORVASTATIN CALCIUM 10 MG PO TABS: 10.0000 mg | ORAL_TABLET | Freq: Every day | ORAL | Status: DC

## 2015-04-21 NOTE — Telephone Encounter (Signed)
Patient advised of lab results and your advise to start statin medication. Patient is requesting to have medication faxed into [facility] in [place].

## 2015-04-22 ENCOUNTER — Encounter: Payer: Self-pay | Admitting: Family Medicine

## 2015-04-22 NOTE — Telephone Encounter (Signed)
Informed pt that medication was sent into pharmacy, as well to call in 6 weeks for lab slip. Renaldo Fiddler, CMA

## 2015-04-24 ENCOUNTER — Other Ambulatory Visit: Payer: Self-pay | Admitting: Family Medicine

## 2015-04-24 DIAGNOSIS — E1159 Type 2 diabetes mellitus with other circulatory complications: Secondary | ICD-10-CM | POA: Insufficient documentation

## 2015-04-24 DIAGNOSIS — I10 Essential (primary) hypertension: Secondary | ICD-10-CM | POA: Insufficient documentation

## 2015-04-24 DIAGNOSIS — I152 Hypertension secondary to endocrine disorders: Secondary | ICD-10-CM | POA: Insufficient documentation

## 2015-04-24 DIAGNOSIS — E119 Type 2 diabetes mellitus without complications: Secondary | ICD-10-CM | POA: Insufficient documentation

## 2015-06-17 ENCOUNTER — Other Ambulatory Visit: Payer: Self-pay | Admitting: Family Medicine

## 2015-06-17 DIAGNOSIS — E119 Type 2 diabetes mellitus without complications: Secondary | ICD-10-CM

## 2015-06-17 DIAGNOSIS — I10 Essential (primary) hypertension: Secondary | ICD-10-CM

## 2015-06-17 MED ORDER — AMLODIPINE BESYLATE 2.5 MG PO TABS: 2.5000 mg | ORAL_TABLET | Freq: Every day | ORAL | Status: DC

## 2015-06-17 MED ORDER — METFORMIN HCL 1000 MG PO TABS: 1000.0000 mg | ORAL_TABLET | Freq: Two times a day (BID) | ORAL | Status: DC

## 2015-06-17 MED ORDER — SITAGLIPTIN PHOSPHATE 100 MG PO TABS: 100.0000 mg | ORAL_TABLET | Freq: Every day | ORAL | Status: DC

## 2015-06-17 NOTE — Telephone Encounter (Signed)
Pt contacted office for refill request on the following medications: enalapril (VASOTEC) 20 MG tablet,metFORMIN (GLUCOPHAGE) 1000 MG tablet, sitaGLIPtin (JANUVIA) 100 MG tablet , & Amlodipine 2.5 mg to [facility] in [place]. Thanks TNP

## 2015-06-17 NOTE — Telephone Encounter (Signed)
Last ov was on 03/31/2015

## 2015-11-07 ENCOUNTER — Other Ambulatory Visit: Payer: Self-pay | Admitting: Family Medicine

## 2015-11-07 DIAGNOSIS — E119 Type 2 diabetes mellitus without complications: Secondary | ICD-10-CM

## 2015-11-07 NOTE — Telephone Encounter (Signed)
Pt needs OV. LOV was 03/31/2015; A1C at that time was 8.5%. Renaldo Fiddler, CMA

## 2015-12-18 ENCOUNTER — Other Ambulatory Visit: Payer: Self-pay | Admitting: Family Medicine

## 2015-12-18 DIAGNOSIS — I10 Essential (primary) hypertension: Secondary | ICD-10-CM

## 2015-12-18 NOTE — Telephone Encounter (Signed)
Last OV 03/2015  Thanks,   -Vuk Skillern  

## 2016-01-17 ENCOUNTER — Other Ambulatory Visit: Payer: Self-pay | Admitting: Family Medicine

## 2016-01-17 DIAGNOSIS — E119 Type 2 diabetes mellitus without complications: Secondary | ICD-10-CM

## 2016-01-19 DIAGNOSIS — E119 Type 2 diabetes mellitus without complications: Secondary | ICD-10-CM | POA: Insufficient documentation

## 2016-01-19 NOTE — Telephone Encounter (Signed)
03/31/15 last ov

## 2016-03-22 ENCOUNTER — Other Ambulatory Visit: Payer: Self-pay | Admitting: Family Medicine

## 2016-03-22 DIAGNOSIS — I10 Essential (primary) hypertension: Secondary | ICD-10-CM

## 2016-03-22 DIAGNOSIS — E119 Type 2 diabetes mellitus without complications: Secondary | ICD-10-CM

## 2016-04-03 ENCOUNTER — Other Ambulatory Visit: Payer: Self-pay | Admitting: Family Medicine

## 2016-04-03 DIAGNOSIS — E119 Type 2 diabetes mellitus without complications: Secondary | ICD-10-CM

## 2016-04-15 ENCOUNTER — Other Ambulatory Visit: Payer: Self-pay | Admitting: Family Medicine

## 2016-04-26 ENCOUNTER — Other Ambulatory Visit: Payer: Self-pay | Admitting: Family Medicine

## 2016-05-02 ENCOUNTER — Other Ambulatory Visit: Payer: Self-pay | Admitting: Family Medicine

## 2016-05-05 ENCOUNTER — Other Ambulatory Visit: Payer: Self-pay | Admitting: Family Medicine

## 2016-05-07 DIAGNOSIS — N529 Male erectile dysfunction, unspecified: Secondary | ICD-10-CM | POA: Insufficient documentation

## 2016-05-07 DIAGNOSIS — R972 Elevated prostate specific antigen [PSA]: Secondary | ICD-10-CM | POA: Insufficient documentation

## 2016-05-07 DIAGNOSIS — M545 Low back pain, unspecified: Secondary | ICD-10-CM | POA: Insufficient documentation

## 2016-05-07 DIAGNOSIS — E0590 Thyrotoxicosis, unspecified without thyrotoxic crisis or storm: Secondary | ICD-10-CM | POA: Insufficient documentation

## 2016-05-07 DIAGNOSIS — R918 Other nonspecific abnormal finding of lung field: Secondary | ICD-10-CM | POA: Insufficient documentation

## 2016-05-07 DIAGNOSIS — F17200 Nicotine dependence, unspecified, uncomplicated: Secondary | ICD-10-CM | POA: Insufficient documentation

## 2016-05-07 DIAGNOSIS — C4491 Basal cell carcinoma of skin, unspecified: Secondary | ICD-10-CM | POA: Insufficient documentation

## 2016-05-07 DIAGNOSIS — J302 Other seasonal allergic rhinitis: Secondary | ICD-10-CM | POA: Insufficient documentation

## 2016-05-07 DIAGNOSIS — N401 Enlarged prostate with lower urinary tract symptoms: Secondary | ICD-10-CM | POA: Insufficient documentation

## 2016-05-10 ENCOUNTER — Encounter: Payer: Self-pay | Admitting: Physician Assistant

## 2016-05-10 ENCOUNTER — Ambulatory Visit (INDEPENDENT_AMBULATORY_CARE_PROVIDER_SITE_OTHER): Payer: Medicare Other | Admitting: Physician Assistant

## 2016-05-10 VITALS — BP 150/60 | HR 75 | Temp 98.3°F | Resp 16 | Wt 215.0 lb

## 2016-05-10 DIAGNOSIS — E785 Hyperlipidemia, unspecified: Secondary | ICD-10-CM | POA: Diagnosis not present

## 2016-05-10 DIAGNOSIS — F17200 Nicotine dependence, unspecified, uncomplicated: Secondary | ICD-10-CM | POA: Diagnosis not present

## 2016-05-10 DIAGNOSIS — E0590 Thyrotoxicosis, unspecified without thyrotoxic crisis or storm: Secondary | ICD-10-CM

## 2016-05-10 DIAGNOSIS — I10 Essential (primary) hypertension: Secondary | ICD-10-CM

## 2016-05-10 DIAGNOSIS — N401 Enlarged prostate with lower urinary tract symptoms: Secondary | ICD-10-CM | POA: Diagnosis not present

## 2016-05-10 DIAGNOSIS — C4491 Basal cell carcinoma of skin, unspecified: Secondary | ICD-10-CM | POA: Diagnosis not present

## 2016-05-10 DIAGNOSIS — Z1283 Encounter for screening for malignant neoplasm of skin: Secondary | ICD-10-CM

## 2016-05-10 DIAGNOSIS — E119 Type 2 diabetes mellitus without complications: Secondary | ICD-10-CM | POA: Diagnosis not present

## 2016-05-10 MED ORDER — GLIMEPIRIDE 4 MG PO TABS: 4.0000 mg | ORAL_TABLET | Freq: Every day | ORAL | Status: DC

## 2016-05-10 MED ORDER — METFORMIN HCL 1000 MG PO TABS: 1000.0000 mg | ORAL_TABLET | Freq: Two times a day (BID) | ORAL | Status: DC

## 2016-05-10 MED ORDER — HYDROCHLOROTHIAZIDE 25 MG PO TABS: 25.0000 mg | ORAL_TABLET | Freq: Every day | ORAL | Status: DC

## 2016-05-10 NOTE — Patient Instructions (Signed)

## 2016-05-10 NOTE — Progress Notes (Signed)
Patient: Nicholas Nolan Male    DOB: 07/31/1951   65 y.o.   MRN: [id number] Visit Date: 05/10/2016  Today's Provider: Mar Daring, PA-C   No chief complaint on file.  Subjective:    HPI  Diabetes Mellitus Type II, Follow-up:   No results found for: HGBA1C  Last seen for diabetes 12 months ago.  Management since then includes none.Patient was referred to Endocrine. Current symptoms include none and have been stable. Home blood sugar records: fasting range: 95's-120's  Episodes of hypoglycemia? no   Current Insulin Regimen: Trulicity 1.5 mg under the skin every 7 days. Most Recent Eye Exam: not UTD Weight trend: fluctuating a bit Current diet: in general, a "healthy" diet   Current exercise: walking and works in the garden. Patient was seen 04/16/16 at Endocrine-Dr. Solum A1C 6.2. He reports that he goes back to see Dr. Gabriel Carina September.  Pertinent Labs:     Component Value Date/Time   CREATININE 0.96 01/07/2012 1338    Wt Readings from Last 3 Encounters:  05/10/16 215 lb (97.523 kg)  03/31/15 220 lb (99.791 kg)  01/10/12 232 lb 9.4 oz (105.5 kg)   He needs refill on Amaryl 4 MG ------------------------------------------------------------------------    Allergies  Allergen Reactions  . Codeine Itching    And hyper also  . Hydromorphone Itching  . Morphine And Related Itching   Current Meds  Medication Sig  . amLODipine (NORVASC) 2.5 MG tablet Take 1 tablet (2.5 mg total) by mouth daily.  Marland Kitchen atorvastatin (LIPITOR) 10 MG tablet Take 1 tablet (10 mg total) by mouth daily.  . Dulaglutide (TRULICITY) 1.5 LP/3.7TK SOPN Inject 1.5 mg into the skin.  Marland Kitchen enalapril (VASOTEC) 20 MG tablet 1 (ONE) TABLET, ORAL, TWO TIMES DAILY  . glimepiride (AMARYL) 4 MG tablet Take 1 tablet (4 mg total) by mouth daily. Pt needs to schedule an office visit before anymore refills.  . hydrochlorothiazide (HYDRODIURIL) 25 MG tablet Take 1 tablet (25 mg total) by mouth daily.  Pt needs OV before anymore refills  . metFORMIN (GLUCOPHAGE) 1000 MG tablet Take 1 tablet (1,000 mg total) by mouth 2 (two) times daily with a meal. Pt needs OV before anymore refills    Review of Systems  Constitutional: Negative.   Respiratory: Negative.   Cardiovascular: Negative.   Gastrointestinal: Negative.   Genitourinary: Negative.   Musculoskeletal: Negative.   Neurological: Negative.     Social History  Substance Use Topics  . Smoking status: Former Smoker -- 2.00 packs/day for 45 years  . Smokeless tobacco: Not on file  . Alcohol Use: No   Objective:   BP 150/60 mmHg  Pulse 75  Temp(Src) 98.3 F (36.8 C) (Oral)  Resp 16  Wt 215 lb (97.523 kg)  Physical Exam  Constitutional: He appears well-developed and well-nourished. No distress.  HENT:  Head: Normocephalic and atraumatic.  Neck: Normal range of motion. Neck supple. No JVD present. No tracheal deviation present. No thyromegaly present.  Cardiovascular: Normal rate, regular rhythm and normal heart sounds.  Exam reveals no gallop and no friction rub.   No murmur heard. Pulmonary/Chest: Effort normal and breath sounds normal. No respiratory distress. He has no wheezes. He has no rales.  Abdominal: Soft. Bowel sounds are normal. He exhibits no distension. There is no tenderness.  Lymphadenopathy:    He has no cervical adenopathy.  Skin: He is not diaphoretic.  Vitals reviewed.     Assessment & Plan:  1. Controlled type 2 diabetes mellitus without complication, without long-term current use of insulin (HCC) Stable and improving with trulicity. He is followed by Dr. Gabriel Carina. Metformin was refilled as below. Dr. Gabriel Carina prescribes the Trulicity. He sees her back in September 2017. Will check labs as he has not had labs in a long time. Will f/u pending lab results. If labs stable he does not need to be seen for 1 year. He is to call if he has any worsening symptoms, acute issue, questions or concerns. - metFORMIN  (GLUCOPHAGE) 1000 MG tablet; Take 1 tablet (1,000 mg total) by mouth 2 (two) times daily with a meal.  Dispense: 60 tablet; Refill: 6 - glimepiride (AMARYL) 4 MG tablet; Take 1 tablet (4 mg total) by mouth daily.  Dispense: 30 tablet; Refill: 6 - Comprehensive Metabolic Panel (CMET)  2. Essential hypertension Stable. Diagnosis pulled for medication refill. Continue current medical treatment plan. - hydrochlorothiazide (HYDRODIURIL) 25 MG tablet; Take 1 tablet (25 mg total) by mouth daily.  Dispense: 30 tablet; Refill: 6 - CBC with Differential - Comprehensive Metabolic Panel (CMET)  3. Compulsive tobacco user syndrome Still smoking and using chewing tobacco. No wish to quit.  4. HLD (hyperlipidemia) Will check labs as below and f/u pending results. Patient discontinued atorvastatin on his own. - Lipid Profile  5. Benign prostatic hypertrophy with lower urinary tract symptoms (LUTS) H/O this. Not symptomatic. Will recheck labs and f/u pending results. - PSA  6. Hyperthyroidism H/O this. Will check labs and f/u pending results. - TSH  7. Basal cell carcinoma of skin H/O this and has a few spots he is concerned about that he would like to have evaluated. Referral placed as below.  - Ambulatory referral to Dermatology  8. Skin exam for malignant neoplasm See above.  - Ambulatory referral to Dermatology       Mar Daring, PA-C  [facility]

## 2016-05-14 ENCOUNTER — Telehealth: Payer: Self-pay | Admitting: Physician Assistant

## 2016-05-14 DIAGNOSIS — R972 Elevated prostate specific antigen [PSA]: Secondary | ICD-10-CM

## 2016-05-14 DIAGNOSIS — N401 Enlarged prostate with lower urinary tract symptoms: Secondary | ICD-10-CM

## 2016-05-14 LAB — LIPID PANEL
CHOLESTEROL TOTAL: 204 mg/dL — AB (ref 100–199)
Chol/HDL Ratio: 6.6 ratio units — ABNORMAL HIGH (ref 0.0–5.0)
HDL: 31 mg/dL — AB (ref >39)
LDL Calculated: 97 mg/dL (ref 0–99)
Triglycerides: 382 mg/dL — ABNORMAL HIGH (ref 0–149)
VLDL CHOLESTEROL CAL: 76 mg/dL — AB (ref 5–40)

## 2016-05-14 LAB — CBC WITH DIFFERENTIAL/PLATELET
BASOS ABS: 0.1 10*3/uL (ref 0.0–0.2)
Basos: 1 %
EOS (ABSOLUTE): 0.2 10*3/uL (ref 0.0–0.4)
Eos: 4 %
HEMOGLOBIN: 13 g/dL (ref 12.6–17.7)
Hematocrit: 38.5 % (ref 37.5–51.0)
Immature Grans (Abs): 0 10*3/uL (ref 0.0–0.1)
Immature Granulocytes: 0 %
LYMPHS ABS: 2.2 10*3/uL (ref 0.7–3.1)
Lymphs: 44 %
MCH: 29.9 pg (ref 26.6–33.0)
MCHC: 33.8 g/dL (ref 31.5–35.7)
MCV: 89 fL (ref 79–97)
MONOCYTES: 7 %
Monocytes Absolute: 0.3 10*3/uL (ref 0.1–0.9)
Neutrophils Absolute: 2.2 10*3/uL (ref 1.4–7.0)
Neutrophils: 44 %
PLATELETS: 212 10*3/uL (ref 150–379)
RBC: 4.35 x10E6/uL (ref 4.14–5.80)
RDW: 14.1 % (ref 12.3–15.4)
WBC: 4.9 10*3/uL (ref 3.4–10.8)

## 2016-05-14 LAB — COMPREHENSIVE METABOLIC PANEL
ALK PHOS: 93 IU/L (ref 39–117)
ALT: 6 IU/L (ref 0–44)
AST: 12 IU/L (ref 0–40)
Albumin/Globulin Ratio: 1.5 (ref 1.2–2.2)
Albumin: 4 g/dL (ref 3.6–4.8)
BUN/Creatinine Ratio: 19 (ref 10–24)
BUN: 24 mg/dL (ref 8–27)
Bilirubin Total: 0.3 mg/dL (ref 0.0–1.2)
CHLORIDE: 100 mmol/L (ref 96–106)
CO2: 21 mmol/L (ref 18–29)
CREATININE: 1.24 mg/dL (ref 0.76–1.27)
Calcium: 9.2 mg/dL (ref 8.6–10.2)
GFR calc Af Amer: 70 mL/min/{1.73_m2} (ref >59)
GFR calc non Af Amer: 61 mL/min/{1.73_m2} (ref >59)
GLUCOSE: 166 mg/dL — AB (ref 65–99)
Globulin, Total: 2.6 g/dL (ref 1.5–4.5)
Potassium: 4.6 mmol/L (ref 3.5–5.2)
Sodium: 138 mmol/L (ref 134–144)
Total Protein: 6.6 g/dL (ref 6.0–8.5)

## 2016-05-14 LAB — PSA: PROSTATE SPECIFIC AG, SERUM: 8.3 ng/mL — AB (ref 0.0–4.0)

## 2016-05-14 LAB — TSH: TSH: 0.678 u[IU]/mL (ref 0.450–4.500)

## 2016-05-14 NOTE — Telephone Encounter (Signed)
Called patient to give lab results and left VM for him to call me back to discuss.

## 2016-05-17 NOTE — Telephone Encounter (Signed)
Results given to patient over the phone. Will refer back to Dr. Bernardo Heater for slightly elevated PSA and BPH. Patient agrees.

## 2016-06-29 ENCOUNTER — Other Ambulatory Visit: Payer: Self-pay | Admitting: Family Medicine

## 2016-06-29 DIAGNOSIS — E119 Type 2 diabetes mellitus without complications: Secondary | ICD-10-CM

## 2016-06-29 DIAGNOSIS — I10 Essential (primary) hypertension: Secondary | ICD-10-CM

## 2016-06-30 NOTE — Telephone Encounter (Signed)
LOV with you 05/10/2016. Renaldo Fiddler, CMA

## 2016-11-15 ENCOUNTER — Ambulatory Visit (INDEPENDENT_AMBULATORY_CARE_PROVIDER_SITE_OTHER): Payer: Medicare Other | Admitting: Physician Assistant

## 2016-11-15 ENCOUNTER — Encounter: Payer: Self-pay | Admitting: Physician Assistant

## 2016-11-15 VITALS — BP 142/78 | HR 64 | Temp 97.6°F | Resp 16 | Wt 229.0 lb

## 2016-11-15 DIAGNOSIS — R202 Paresthesia of skin: Secondary | ICD-10-CM

## 2016-11-15 DIAGNOSIS — M25511 Pain in right shoulder: Secondary | ICD-10-CM | POA: Diagnosis not present

## 2016-11-15 DIAGNOSIS — M62838 Other muscle spasm: Secondary | ICD-10-CM

## 2016-11-15 DIAGNOSIS — M719 Bursopathy, unspecified: Secondary | ICD-10-CM | POA: Diagnosis not present

## 2016-11-15 DIAGNOSIS — R200 Anesthesia of skin: Secondary | ICD-10-CM

## 2016-11-15 MED ORDER — METHYLPREDNISOLONE 4 MG PO TBPK: ORAL_TABLET | ORAL | 0 refills | Status: DC

## 2016-11-15 MED ORDER — CYCLOBENZAPRINE HCL 5 MG PO TABS: 5.0000 mg | ORAL_TABLET | Freq: Every day | ORAL | 0 refills | Status: DC

## 2016-11-15 NOTE — Patient Instructions (Signed)
Bursitis Introduction Bursitis is inflammation and irritation of a bursa, which is one of the small, fluid-filled sacs that cushion and protect the moving parts of your body. These sacs are located between bones and muscles, muscle attachments, or skin areas next to bones. A bursa protects these structures from the wear and tear that results from frequent movement. An inflamed bursa causes pain and swelling. Fluid may build up inside the sac. Bursitis is most common near joints, especially the knees, elbows, hips, and shoulders. What are the causes? Bursitis can be caused by:  Injury from:  A direct blow, like falling on your knee or elbow.  Overuse of a joint (repetitive stress).  Infection. This can happen if bacteria gets into a bursa through a cut or scrape near a joint.  Diseases that cause joint inflammation, such as gout and rheumatoid arthritis. What increases the risk? You may be at risk for bursitis if you:  Have a job or hobby that involves a lot of repetitive stress on your joints.  Have a condition that weakens your body's defense system (immune system), such as diabetes, cancer, or HIV.  Lift and reach overhead often.  Kneel or lean on hard surfaces often.  Run or walk often. What are the signs or symptoms? The most common signs and symptoms of bursitis are:  Pain that gets worse when you move the affected body part or put weight on it.  Inflammation.  Stiffness. Other signs and symptoms may include:  Redness.  Tenderness.  Warmth.  Pain that continues after rest.  Fever and chills. This may occur in bursitis caused by infection. How is this diagnosed? Bursitis may be diagnosed by:  Medical history and physical exam.  MRI.  A procedure to drain fluid from the bursa with a needle (aspiration). The fluid may be checked for signs of infection or gout.  Blood tests to rule out other causes of inflammation. How is this treated? Bursitis can usually  be treated at home with rest, ice, compression, and elevation (RICE). For mild bursitis, RICE treatment may be all you need. Other treatments may include:  Nonsteroidal anti-inflammatory drugs (NSAIDs) to treat pain and inflammation.  Corticosteroids to fight inflammation. You may have these drugs injected into and around the area of bursitis.  Aspiration of bursitis fluid to relieve pain and improve movement.  Antibiotic medicine to treat an infected bursa.  A splint, brace, or walking aid.  Physical therapy if you continue to have pain or limited movement.  Surgery to remove a damaged or infected bursa. This may be needed if you have a very bad case of bursitis or if other treatments have not worked. Follow these instructions at home:  Take medicines only as directed by your health care provider.  If you were prescribed an antibiotic medicine, finish it all even if you start to feel better.  Rest the affected area as directed by your health care provider.  Keep the area elevated.  Avoid activities that make pain worse.  Apply ice to the injured area:  Place ice in a plastic bag.  Place a towel between your skin and the bag.  Leave the ice on for 20 minutes, 2-3 times a day.  Use splints, braces, pads, or walking aids as directed by your health care provider.  Keep all follow-up visits as directed by your health care provider. This is important. How is this prevented?  Wear knee pads if you kneel often.  Wear sturdy running or walking  shoes that fit you well.  Take regular breaks from repetitive activity.  Warm up by stretching before doing any strenuous activity.  Maintain a healthy weight or lose weight as recommended by your health care provider. Ask your health care provider if you need help.  Exercise regularly. Start any new physical activity gradually. Contact a health care provider if:  Your bursitis is not responding to treatment or home care.  You  have a fever.  You have chills. This information is not intended to replace advice given to you by your health care provider. Make sure you discuss any questions you have with your health care provider. Document Released: 10/22/2000 Document Revised: 04/01/2016 Document Reviewed: 01/14/2014  2017 Elsevier

## 2016-11-15 NOTE — Progress Notes (Signed)
Patient: Nicholas Nolan Male    DOB: [birth year]   66 y.o.   MRN: [id number] Visit Date: 11/15/2016  Today's Provider: Mar Daring, PA-C   Chief Complaint  Patient presents with  . Shoulder Pain   Subjective:    HPI Patient here today c/o right shoulder and right elbow pain since Thursday. Patient reports pain is worsening and Tylenol or Ibuprofen are not helping. Patient reports he has had surgery and has a pin in his right shoulder from an injury when he was a teenager. Patient reports that he also has a history of bursitis in his right elbow.  There was no mechanism of injury to the shoulder per the patient he just went to coming on Thursday, slept really hard Thursday night and then woke up Friday morning with being unable to move the right arm well without pain and a constant throbbing in the right arm and right elbow. Patient is right handed.    Allergies  Allergen Reactions  . Codeine Itching    And hyper also  . Hydromorphone Itching  . Morphine And Related Itching     Current Outpatient Prescriptions:  .  amLODipine (NORVASC) 2.5 MG tablet, Take 1 tablet (2.5 mg total) by mouth daily., Disp: 90 tablet, Rfl: 3 .  atorvastatin (LIPITOR) 10 MG tablet, Take 1 tablet (10 mg total) by mouth daily., Disp: 90 tablet, Rfl: 3 .  Dulaglutide (TRULICITY) 1.5 OE/3.2ZY SOPN, Inject 1.5 mg into the skin., Disp: , Rfl:  .  enalapril (VASOTEC) 20 MG tablet, TAKE ONE TABLET BY MOUTH TWICE A DAY, Disp: 60 tablet, Rfl: 5 .  glimepiride (AMARYL) 4 MG tablet, Take 1 tablet (4 mg total) by mouth daily., Disp: 30 tablet, Rfl: 6 .  hydrochlorothiazide (HYDRODIURIL) 25 MG tablet, Take 1 tablet (25 mg total) by mouth daily., Disp: 30 tablet, Rfl: 6 .  metFORMIN (GLUCOPHAGE) 1000 MG tablet, TAKE ONE TABLET BY MOUTH TWICE A DAY WTH A MEAL, Disp: 60 tablet, Rfl: 5  Review of Systems  Constitutional: Negative for activity change and fatigue.  Respiratory: Negative.   Cardiovascular:  Negative.   Musculoskeletal: Positive for arthralgias.  Neurological: Positive for weakness. Negative for numbness.    Social History  Substance Use Topics  . Smoking status: Former Smoker    Packs/day: 2.00    Years: 45.00  . Smokeless tobacco: Current User    Types: Snuff  . Alcohol use No   Objective:   BP (!) 142/78 (BP Location: Left Arm, Patient Position: Sitting, Cuff Size: Large)   Pulse 64   Temp 97.6 F (36.4 C) (Oral)   Resp 16   Wt 229 lb (103.9 kg)   BMI 33.82 kg/m   Physical Exam  Constitutional: He appears well-developed and well-nourished. No distress.  HENT:  Head: Normocephalic and atraumatic.  Cardiovascular: Normal rate, regular rhythm and normal heart sounds.  Exam reveals no gallop and no friction rub.   No murmur heard. Pulmonary/Chest: Effort normal and breath sounds normal. No respiratory distress. He has no wheezes. He has no rales.  Musculoskeletal:       Right shoulder: He exhibits decreased range of motion, tenderness, bony tenderness, spasm and decreased strength.       Left shoulder: Normal.       Right elbow: He exhibits decreased range of motion. He exhibits no swelling and no deformity. Tenderness found. Lateral epicondyle tenderness noted.       Right wrist: Normal.  Right hand: Normal. Normal sensation noted.  Patient was noted to have spasm in right upper trapezius. Testing was limited secondary to pain. Does report numbness/tingling into the hand  Skin: He is not diaphoretic.  Vitals reviewed.      Assessment & Plan:     1. Bursitis of multiple sites He does have slight swelling and tenderness to the [facility] joint on the right side as well as the right olecranon bursa. I will treat with a Medrol Dosepak as below. I will get imaging of the right shoulder and cervical spine secondary to be radiating numbness and the severity of the acute pain he is having in the right shoulder. I will follow-up with him pending these x-ray results. I  will also give him cyclobenzaprine for the muscle spasm noted in the right upper trapezius. I do question if this is secondary to any cervical spine process versus trying to protect the shoulder from any injury to the right rotator cuff. I will see him back in approximately 2 weeks to see how he responds to the Medrol Dosepak if necessary. If x-ray is abnormal may consider orthopedic referral. - methylPREDNISolone (MEDROL) 4 MG TBPK tablet; 6 day taper; take as directed on package instructions  Dispense: 21 tablet; Refill: 0  2. Muscle spasm See above medical treatment plan. - cyclobenzaprine (FLEXERIL) 5 MG tablet; Take 1 tablet (5 mg total) by mouth at bedtime.  Dispense: 30 tablet; Refill: 0  3. Acute pain of right shoulder See above medical treatment plan. - DG Shoulder Right; Future  4. Numbness and tingling in right hand See above medical treatment plan. - DG Cervical Spine Complete; Future       Mar Daring, PA-C  [facility]

## 2016-11-16 ENCOUNTER — Ambulatory Visit
Admission: RE | Admit: 2016-11-16 | Discharge: 2016-11-16 | Disposition: A | Discharge status: Home / Self Care | Payer: Medicare Other | Source: Ambulatory Visit | Attending: Physician Assistant | Admitting: Physician Assistant

## 2016-11-16 ENCOUNTER — Telehealth: Payer: Self-pay

## 2016-11-16 DIAGNOSIS — M25511 Pain in right shoulder: Secondary | ICD-10-CM

## 2016-11-16 DIAGNOSIS — R200 Anesthesia of skin: Secondary | ICD-10-CM | POA: Insufficient documentation

## 2016-11-16 DIAGNOSIS — R202 Paresthesia of skin: Secondary | ICD-10-CM

## 2016-11-16 DIAGNOSIS — M47892 Other spondylosis, cervical region: Secondary | ICD-10-CM | POA: Insufficient documentation

## 2016-11-16 DIAGNOSIS — M19011 Primary osteoarthritis, right shoulder: Secondary | ICD-10-CM | POA: Insufficient documentation

## 2016-11-16 DIAGNOSIS — M12811 Other specific arthropathies, not elsewhere classified, right shoulder: Secondary | ICD-10-CM

## 2016-11-16 NOTE — Telephone Encounter (Signed)
Referral placed to Dr. Mack Guise

## 2016-11-16 NOTE — Telephone Encounter (Signed)
Pt advised. He agrees to referral. Who would you recommend? Renaldo Fiddler, CMA

## 2016-11-16 NOTE — Telephone Encounter (Signed)
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 Recommend orthopedic evaluation.

## 2016-11-26 ENCOUNTER — Ambulatory Visit (INDEPENDENT_AMBULATORY_CARE_PROVIDER_SITE_OTHER): Payer: Medicare Other | Admitting: Physician Assistant

## 2016-11-26 ENCOUNTER — Encounter: Payer: Self-pay | Admitting: Physician Assistant

## 2016-11-26 VITALS — BP 160/60 | HR 93 | Temp 98.9°F | Resp 16 | Wt 213.8 lb

## 2016-11-26 DIAGNOSIS — R059 Cough, unspecified: Secondary | ICD-10-CM

## 2016-11-26 DIAGNOSIS — R05 Cough: Secondary | ICD-10-CM | POA: Diagnosis not present

## 2016-11-26 DIAGNOSIS — J069 Acute upper respiratory infection, unspecified: Secondary | ICD-10-CM

## 2016-11-26 MED ORDER — BENZONATATE 100 MG PO CAPS: 100.0000 mg | ORAL_CAPSULE | Freq: Two times a day (BID) | ORAL | 0 refills | Status: DC | PRN

## 2016-11-26 MED ORDER — PROMETHAZINE-DM 6.25-15 MG/5ML PO SYRP
5.0000 mL | ORAL_SOLUTION | Freq: Four times a day (QID) | ORAL | 0 refills | Status: DC | PRN
Start: 2016-11-26 — End: 2016-11-29

## 2016-11-26 MED ORDER — AMOXICILLIN-POT CLAVULANATE 875-125 MG PO TABS: 1.0000 | ORAL_TABLET | Freq: Two times a day (BID) | ORAL | 0 refills | Status: DC

## 2016-11-26 MED ORDER — PREDNISONE 10 MG PO TABS: ORAL_TABLET | ORAL | 0 refills | Status: DC

## 2016-11-26 NOTE — Patient Instructions (Signed)
Upper Respiratory Infection, Adult Most upper respiratory infections (URIs) are a viral infection of the air passages leading to the lungs. A URI affects the nose, throat, and upper air passages. The most common type of URI is nasopharyngitis and is typically referred to as "the common cold." URIs run their course and usually go away on their own. Most of the time, a URI does not require medical attention, but sometimes a bacterial infection in the upper airways can follow a viral infection. This is called a secondary infection. Sinus and middle ear infections are common types of secondary upper respiratory infections. Bacterial pneumonia can also complicate a URI. A URI can worsen asthma and chronic obstructive pulmonary disease (COPD). Sometimes, these complications can require emergency medical care and may be life threatening. What are the causes? Almost all URIs are caused by viruses. A virus is a type of germ and can spread from one person to another. What increases the risk? You may be at risk for a URI if:  You smoke.  You have chronic heart or lung disease.  You have a weakened defense (immune) system.  You are very young or very old.  You have nasal allergies or asthma.  You work in crowded or poorly ventilated areas.  You work in health care facilities or schools.  What are the signs or symptoms? Symptoms typically develop 2-3 days after you come in contact with a cold virus. Most viral URIs last 7-10 days. However, viral URIs from the influenza virus (flu virus) can last 14-18 days and are typically more severe. Symptoms may include:  Runny or stuffy (congested) nose.  Sneezing.  Cough.  Sore throat.  Headache.  Fatigue.  Fever.  Loss of appetite.  Pain in your forehead, behind your eyes, and over your cheekbones (sinus pain).  Muscle aches.  How is this diagnosed? Your health care provider may diagnose a URI by:  Physical exam.  Tests to check that your  symptoms are not due to another condition such as: ? Strep throat. ? Sinusitis. ? Pneumonia. ? Asthma.  How is this treated? A URI goes away on its own with time. It cannot be cured with medicines, but medicines may be prescribed or recommended to relieve symptoms. Medicines may help:  Reduce your fever.  Reduce your cough.  Relieve nasal congestion.  Follow these instructions at home:  Take medicines only as directed by your health care provider.  Gargle warm saltwater or take cough drops to comfort your throat as directed by your health care provider.  Use a warm mist humidifier or inhale steam from a shower to increase air moisture. This may make it easier to breathe.  Drink enough fluid to keep your urine clear or pale yellow.  Eat soups and other clear broths and maintain good nutrition.  Rest as needed.  Return to work when your temperature has returned to normal or as your health care provider advises. You may need to stay home longer to avoid infecting others. You can also use a face mask and careful hand washing to prevent spread of the virus.  Increase the usage of your inhaler if you have asthma.  Do not use any tobacco products, including cigarettes, chewing tobacco, or electronic cigarettes. If you need help quitting, ask your health care provider. How is this prevented? The best way to protect yourself from getting a cold is to practice good hygiene.  Avoid oral or hand contact with people with cold symptoms.  Wash your   hands often if contact occurs.  There is no clear evidence that vitamin C, vitamin E, echinacea, or exercise reduces the chance of developing a cold. However, it is always recommended to get plenty of rest, exercise, and practice good nutrition. Contact a health care provider if:  You are getting worse rather than better.  Your symptoms are not controlled by medicine.  You have chills.  You have worsening shortness of breath.  You have  brown or red mucus.  You have yellow or brown nasal discharge.  You have pain in your face, especially when you bend forward.  You have a fever.  You have swollen neck glands.  You have pain while swallowing.  You have white areas in the back of your throat. Get help right away if:  You have severe or persistent: ? Headache. ? Ear pain. ? Sinus pain. ? Chest pain.  You have chronic lung disease and any of the following: ? Wheezing. ? Prolonged cough. ? Coughing up blood. ? A change in your usual mucus.  You have a stiff neck.  You have changes in your: ? Vision. ? Hearing. ? Thinking. ? Mood. This information is not intended to replace advice given to you by your health care provider. Make sure you discuss any questions you have with your health care provider. Document Released: 04/20/2001 Document Revised: 06/27/2016 Document Reviewed: 01/30/2014 Elsevier Interactive Patient Education  2017 [employer]vier Inc.  

## 2016-11-26 NOTE — Progress Notes (Signed)
Patient: Nicholas Nolan Male    DOB: Mar 05, 1951   66 y.o.   MRN: [id number] Visit Date: 11/26/2016  Today's Provider: Mar Daring, PA-C   Chief Complaint  Patient presents with  . URI   Subjective:    URI   This is a new problem. The current episode started in the past 7 days (per patient cough and fever started on Saturday). The problem has been gradually worsening. Associated symptoms include congestion, coughing, diarrhea, headaches, rhinorrhea, sneezing, vomiting and wheezing. Pertinent negatives include no abdominal pain, chest pain, ear pain, nausea or sore throat. He has tried acetaminophen and decongestant for the symptoms. The treatment provided no relief.      Allergies  Allergen Reactions  . Codeine Itching    And hyper also  . Hydromorphone Itching  . Morphine And Related Itching     Current Outpatient Prescriptions:  .  amLODipine (NORVASC) 2.5 MG tablet, Take 1 tablet (2.5 mg total) by mouth daily., Disp: 90 tablet, Rfl: 3 .  atorvastatin (LIPITOR) 10 MG tablet, Take 1 tablet (10 mg total) by mouth daily., Disp: 90 tablet, Rfl: 3 .  Dulaglutide (TRULICITY) 1.5 HY/0.7PX SOPN, Inject 1.5 mg into the skin., Disp: , Rfl:  .  enalapril (VASOTEC) 20 MG tablet, TAKE ONE TABLET BY MOUTH TWICE A DAY, Disp: 60 tablet, Rfl: 5 .  glimepiride (AMARYL) 4 MG tablet, Take 1 tablet (4 mg total) by mouth daily., Disp: 30 tablet, Rfl: 6 .  hydrochlorothiazide (HYDRODIURIL) 25 MG tablet, Take 1 tablet (25 mg total) by mouth daily., Disp: 30 tablet, Rfl: 6 .  metFORMIN (GLUCOPHAGE) 1000 MG tablet, TAKE ONE TABLET BY MOUTH TWICE A DAY WTH A MEAL, Disp: 60 tablet, Rfl: 5 .  methylPREDNISolone (MEDROL) 4 MG TBPK tablet, 6 day taper; take as directed on package instructions, Disp: 21 tablet, Rfl: 0 .  cyclobenzaprine (FLEXERIL) 5 MG tablet, Take 1 tablet (5 mg total) by mouth at bedtime. (Patient not taking: Reported on 11/26/2016), Disp: 30 tablet, Rfl: 0  Review of  Systems  Constitutional: Positive for fatigue and fever (unmeasured; hot then cold).  HENT: Positive for congestion, postnasal drip, rhinorrhea and sneezing. Negative for ear pain, sinus pressure and sore throat.   Respiratory: Positive for cough, chest tightness, shortness of breath and wheezing.   Cardiovascular: Negative for chest pain, palpitations and leg swelling.  Gastrointestinal: Positive for diarrhea and vomiting. Negative for abdominal pain and nausea.  Musculoskeletal: Positive for arthralgias.  Neurological: Positive for headaches.    Social History  Substance Use Topics  . Smoking status: Former Smoker    Packs/day: 2.00    Years: 45.00  . Smokeless tobacco: Current User    Types: Snuff  . Alcohol use No   Objective:   BP (!) 160/60 (BP Location: Left Arm, Patient Position: Sitting, Cuff Size: Normal)   Pulse 93   Temp 98.9 F (37.2 C) (Oral)   Resp 16   Wt 213 lb 12.8 oz (97 kg)   SpO2 96% Comment: 96-95%  BMI 31.57 kg/m   Physical Exam  Constitutional: He appears well-developed and well-nourished. No distress.  HENT:  Head: Normocephalic and atraumatic.  Right Ear: Hearing, tympanic membrane, external ear and ear canal normal. Tympanic membrane is not erythematous and not bulging. No middle ear effusion.  Left Ear: Hearing, tympanic membrane, external ear and ear canal normal. Tympanic membrane is not erythematous and not bulging.  No middle ear effusion.  Nose: Mucosal  edema and rhinorrhea present. Right sinus exhibits no maxillary sinus tenderness and no frontal sinus tenderness. Left sinus exhibits no maxillary sinus tenderness and no frontal sinus tenderness.  Mouth/Throat: Uvula is midline and mucous membranes are normal. Posterior oropharyngeal erythema present. No oropharyngeal exudate or posterior oropharyngeal edema.  Eyes: Conjunctivae and EOM are normal. Pupils are equal, round, and reactive to light. Right eye exhibits no discharge. Left eye exhibits  no discharge.  Neck: Normal range of motion. Neck supple. No tracheal deviation present. No Brudzinski's sign and no Kernig's sign noted. No thyromegaly present.  Cardiovascular: Normal rate, regular rhythm and normal heart sounds.  Exam reveals no gallop and no friction rub.   No murmur heard. Pulmonary/Chest: Effort normal. No stridor. No respiratory distress. He has decreased breath sounds. He has no wheezes. He has no rhonchi. He has no rales.  Lymphadenopathy:    He has no cervical adenopathy.  Skin: Skin is warm and dry. He is not diaphoretic.  Vitals reviewed.       Assessment & Plan:     1. Upper respiratory tract infection, unspecified type Worsening symptoms that have not responded to OTC medications. Will give augmentin as below. May use Mucinex DM for congestion. Stay well hydrated and get plenty of rest. Call if no symptom improvement or if symptoms worsen. - amoxicillin-clavulanate (AUGMENTIN) 875-125 MG tablet; Take 1 tablet by mouth 2 (two) times daily.  Dispense: 20 tablet; Refill: 0 - predniSONE (DELTASONE) 10 MG tablet; Take 6 tabs PO on day 1&2, 5 tabs PO on day 3&4, 4 tabs PO on day 5&6, 3 tabs PO on day 7&8, 2 tabs PO on day 9&10, 1 tab PO on day 11&12.  Dispense: 42 tablet; Refill: 0  2. Cough Worsening symptoms that has not responded to OTC medications. Will give Promethazine-DM cough syrup as below for nighttime cough. Drowsiness precautions given to patient. Stay well hydrated. Use Tessalon perles for daytime cough. Prednisone taper given for inflammation and decreased breath sounds. - promethazine-dextromethorphan (PROMETHAZINE-DM) 6.25-15 MG/5ML syrup; Take 5 mLs by mouth 4 (four) times daily as needed for cough.  Dispense: 180 mL; Refill: 0 - benzonatate (TESSALON) 100 MG capsule; Take 1 capsule (100 mg total) by mouth 2 (two) times daily as needed for cough.  Dispense: 20 capsule; Refill: 0 - predniSONE (DELTASONE) 10 MG tablet; Take 6 tabs PO on day 1&2, 5 tabs  PO on day 3&4, 4 tabs PO on day 5&6, 3 tabs PO on day 7&8, 2 tabs PO on day 9&10, 1 tab PO on day 11&12.  Dispense: 42 tablet; Refill: 0       Mar Daring, PA-C  [facility]

## 2016-11-29 ENCOUNTER — Encounter: Payer: Self-pay | Admitting: Physician Assistant

## 2016-11-29 ENCOUNTER — Ambulatory Visit (INDEPENDENT_AMBULATORY_CARE_PROVIDER_SITE_OTHER): Payer: Medicare Other | Admitting: Physician Assistant

## 2016-11-29 VITALS — BP 132/56 | HR 94 | Temp 97.8°F | Resp 16 | Wt 211.0 lb

## 2016-11-29 DIAGNOSIS — J40 Bronchitis, not specified as acute or chronic: Secondary | ICD-10-CM

## 2016-11-29 DIAGNOSIS — R300 Dysuria: Secondary | ICD-10-CM

## 2016-11-29 DIAGNOSIS — R05 Cough: Secondary | ICD-10-CM

## 2016-11-29 DIAGNOSIS — R059 Cough, unspecified: Secondary | ICD-10-CM

## 2016-11-29 LAB — POCT URINALYSIS DIPSTICK
Bilirubin, UA: NEGATIVE
Blood, UA: NEGATIVE
Glucose, UA: NEGATIVE
Ketones, UA: NEGATIVE
Leukocytes, UA: NEGATIVE
Nitrite, UA: NEGATIVE
PH UA: 6
SPEC GRAV UA: 1.02
UROBILINOGEN UA: 0.2

## 2016-11-29 MED ORDER — LEVOFLOXACIN 500 MG PO TABS: 500.0000 mg | ORAL_TABLET | Freq: Every day | ORAL | 0 refills | Status: DC

## 2016-11-29 MED ORDER — HYDROCOD POLST-CPM POLST ER 10-8 MG/5ML PO SUER: 5.0000 mL | Freq: Two times a day (BID) | ORAL | 0 refills | Status: DC | PRN

## 2016-11-29 NOTE — Patient Instructions (Signed)

## 2016-11-29 NOTE — Progress Notes (Signed)
Patient: Nicholas Nolan Male    DOB: 1950-11-19   66 y.o.   MRN: [id number] Visit Date: 11/29/2016  Today's Provider: Mar Daring, PA-C   Chief Complaint  Patient presents with  . Follow-up   Subjective:    HPI  Follow up for right shoulder pain  The patient was last seen for this 2 weeks ago. Changes made at last visit include x-ray and referred to ortho.  He reports good compliance with treatment. He feels that condition is Improved. Patient reports shoulder pain has improved but still having elbow pain. He is not having side effects.   He does report that his appointment was cancelled once by ortho due to weather and once by himself since he has been sick. He is going to call to reschedule once he is better. ------------------------------------------------------------------------------------  Follow up for Cough  The patient was last seen for this 4 days ago. Changes made at last visit include starting Augmentin.  He reports excellent compliance with treatment. He feels that condition is Worse. He is not having side effects.  ------------------------------------------------------------------------------------    Allergies  Allergen Reactions  . Codeine Itching    And hyper also  . Hydromorphone Itching  . Morphine And Related Itching   Patient Active Problem List   Diagnosis Date Noted  . Basal cell carcinoma of skin 05/07/2016  . Benign prostatic hypertrophy with lower urinary tract symptoms (LUTS) 05/07/2016  . Abnormal prostate specific antigen 05/07/2016  . ED (erectile dysfunction) of organic origin 05/07/2016  . Hyperthyroidism 05/07/2016  . LBP (low back pain) 05/07/2016  . Lung mass 05/07/2016  . Allergic rhinitis, seasonal 05/07/2016  . Compulsive tobacco user syndrome 05/07/2016  . Diabetes mellitus without complication ([place]) 84/16/6063  . Diabetes ([place]) 04/24/2015  . Hypertension 04/24/2015  . Hyperlipemia 04/21/2015  .  Controlled type 2 diabetes mellitus without complication, without long-term current use of insulin ([place]) 12/09/2014  . HLD (hyperlipidemia) 12/09/2014  . Cancer of lung ([place]) 12/09/2014      Current Outpatient Prescriptions:  .  amLODipine (NORVASC) 2.5 MG tablet, Take 1 tablet (2.5 mg total) by mouth daily., Disp: 90 tablet, Rfl: 3 .  amoxicillin-clavulanate (AUGMENTIN) 875-125 MG tablet, Take 1 tablet by mouth 2 (two) times daily., Disp: 20 tablet, Rfl: 0 .  atorvastatin (LIPITOR) 10 MG tablet, Take 1 tablet (10 mg total) by mouth daily., Disp: 90 tablet, Rfl: 3 .  benzonatate (TESSALON) 100 MG capsule, Take 1 capsule (100 mg total) by mouth 2 (two) times daily as needed for cough., Disp: 20 capsule, Rfl: 0 .  Dulaglutide (TRULICITY) 1.5 KZ/6.0FU SOPN, Inject 1.5 mg into the skin., Disp: , Rfl:  .  enalapril (VASOTEC) 20 MG tablet, TAKE ONE TABLET BY MOUTH TWICE A DAY, Disp: 60 tablet, Rfl: 5 .  glimepiride (AMARYL) 4 MG tablet, Take 1 tablet (4 mg total) by mouth daily., Disp: 30 tablet, Rfl: 6 .  hydrochlorothiazide (HYDRODIURIL) 25 MG tablet, Take 1 tablet (25 mg total) by mouth daily., Disp: 30 tablet, Rfl: 6 .  metFORMIN (GLUCOPHAGE) 1000 MG tablet, TAKE ONE TABLET BY MOUTH TWICE A DAY WTH A MEAL, Disp: 60 tablet, Rfl: 5 .  predniSONE (DELTASONE) 10 MG tablet, Take 6 tabs PO on day 1&2, 5 tabs PO on day 3&4, 4 tabs PO on day 5&6, 3 tabs PO on day 7&8, 2 tabs PO on day 9&10, 1 tab PO on day 11&12., Disp: 42 tablet, Rfl: 0 .  promethazine-dextromethorphan (  PROMETHAZINE-DM) 6.25-15 MG/5ML syrup, Take 5 mLs by mouth 4 (four) times daily as needed for cough., Disp: 180 mL, Rfl: 0  Review of Systems  Constitutional: Negative.   HENT: Positive for congestion, postnasal drip, rhinorrhea and sinus pressure. Negative for ear pain, sinus pain, sore throat, tinnitus and trouble swallowing.   Respiratory: Positive for cough, chest tightness, shortness of breath and wheezing.   Cardiovascular:  Negative.   Gastrointestinal: Negative for abdominal pain, nausea and vomiting.  Genitourinary: Positive for dysuria.  Neurological: Positive for dizziness. Negative for headaches.    Social History  Substance Use Topics  . Smoking status: Former Smoker    Packs/day: 2.00    Years: 45.00  . Smokeless tobacco: Current User    Types: Snuff  . Alcohol use No   Objective:   BP (!) 132/56 (BP Location: Left Arm, Patient Position: Sitting, Cuff Size: Large)   Pulse 94   Temp 97.8 F (36.6 C) (Oral)   Resp 16   Wt 211 lb (95.7 kg)   SpO2 97%   BMI 31.16 kg/m   Physical Exam  Constitutional: He appears well-developed and well-nourished. No distress.  HENT:  Head: Normocephalic and atraumatic.  Right Ear: Hearing, tympanic membrane, external ear and ear canal normal. Tympanic membrane is not erythematous and not bulging. No middle ear effusion.  Left Ear: Hearing, tympanic membrane, external ear and ear canal normal. Tympanic membrane is not erythematous and not bulging.  No middle ear effusion.  Nose: Mucosal edema and rhinorrhea present. Right sinus exhibits no maxillary sinus tenderness and no frontal sinus tenderness. Left sinus exhibits no maxillary sinus tenderness and no frontal sinus tenderness.  Mouth/Throat: Uvula is midline, oropharynx is clear and moist and mucous membranes are normal. No oropharyngeal exudate, posterior oropharyngeal edema or posterior oropharyngeal erythema.  Eyes: Conjunctivae and EOM are normal. Pupils are equal, round, and reactive to light. Right eye exhibits no discharge. Left eye exhibits no discharge.  Neck: Normal range of motion. Neck supple. No tracheal deviation present. No Brudzinski's sign and no Kernig's sign noted. No thyromegaly present.  Cardiovascular: Normal rate, regular rhythm and normal heart sounds.  Exam reveals no gallop and no friction rub.   No murmur heard. Pulmonary/Chest: Effort normal. No stridor. No respiratory distress. He  has no decreased breath sounds. He has wheezes in the right upper field, the right middle field and the right lower field. He has no rhonchi. He has rales in the right upper field, the right middle field and the right lower field.  Lymphadenopathy:    He has no cervical adenopathy.  Skin: Skin is warm and dry. He is not diaphoretic.  Vitals reviewed.      Assessment & Plan:     1. Bronchitis Worsening symptoms despite being on augmentin for 3 days. We will stop augmentin and switch to levaquin. Advised patient of new lung sounds and offered CXR. Patient wishes to wait and try treatment 1st and if no improvement by Weds will get CXR then. Continue prednisone taper that was given on Friday.  He is to call if symptoms worsen.  - levofloxacin (LEVAQUIN) 500 MG tablet; Take 1 tablet (500 mg total) by mouth daily.  Dispense: 10 tablet; Refill: 0  2. Cough Worsening symptoms that has not responded to OTC medications. Will give Tussionex cough syrup as below for nighttime cough. Drowsiness precautions given to patient. Stay well hydrated. Use delsym, robitussin OR mucinex for daytime cough. - chlorpheniramine-HYDROcodone (TUSSIONEX PENNKINETIC ER) 10-8 MG/5ML  SUER; Take 5 mLs by mouth every 12 (twelve) hours as needed for cough.  Dispense: 140 mL; Refill: 0  3. Dysuria UA was normal today. Possibly from dehydration and lack of rest. Push fluids. Call if symptoms worsen. - POCT Urinalysis Dipstick       Mar Daring, PA-C  [facility]

## 2017-01-03 ENCOUNTER — Other Ambulatory Visit: Payer: Self-pay | Admitting: Physician Assistant

## 2017-01-03 DIAGNOSIS — E119 Type 2 diabetes mellitus without complications: Secondary | ICD-10-CM

## 2017-01-03 DIAGNOSIS — I10 Essential (primary) hypertension: Secondary | ICD-10-CM

## 2017-03-02 ENCOUNTER — Telehealth: Payer: Self-pay | Admitting: Physician Assistant

## 2017-03-02 DIAGNOSIS — E7800 Pure hypercholesterolemia, unspecified: Secondary | ICD-10-CM

## 2017-03-02 MED ORDER — ATORVASTATIN CALCIUM 10 MG PO TABS: 10.0000 mg | ORAL_TABLET | Freq: Every day | ORAL | 3 refills | Status: DC

## 2017-03-02 NOTE — Telephone Encounter (Signed)
Refill request for atorvastatin '10mg'$  received from [facility]. Refill sent in.

## 2017-03-23 LAB — HEMOGLOBIN A1C: HEMOGLOBIN A1C: 7.4

## 2017-06-11 ENCOUNTER — Telehealth: Payer: Self-pay | Admitting: Physician Assistant

## 2017-06-11 DIAGNOSIS — I10 Essential (primary) hypertension: Secondary | ICD-10-CM

## 2017-06-11 NOTE — Telephone Encounter (Signed)
[facility]ed a refill request on the following medications:  enalapril (VASOTEC) 20 MG tablet   90 day supply.  [facility]

## 2017-06-13 MED ORDER — ENALAPRIL MALEATE 20 MG PO TABS: 20.0000 mg | ORAL_TABLET | Freq: Two times a day (BID) | ORAL | 1 refills | Status: DC

## 2017-08-26 ENCOUNTER — Other Ambulatory Visit: Payer: Self-pay | Admitting: Physician Assistant

## 2017-08-26 DIAGNOSIS — I10 Essential (primary) hypertension: Secondary | ICD-10-CM

## 2017-08-26 DIAGNOSIS — E119 Type 2 diabetes mellitus without complications: Secondary | ICD-10-CM

## 2017-08-26 NOTE — Telephone Encounter (Signed)
Last ov 11/29/16 Last filled 01/03/17. Patient does not have appointment scheduled for FU Please review. Thank you. sd

## 2017-09-07 ENCOUNTER — Other Ambulatory Visit: Payer: Self-pay | Admitting: Physician Assistant

## 2017-09-07 DIAGNOSIS — E119 Type 2 diabetes mellitus without complications: Secondary | ICD-10-CM

## 2018-03-14 ENCOUNTER — Other Ambulatory Visit: Payer: Self-pay | Admitting: Physician Assistant

## 2018-03-14 DIAGNOSIS — E119 Type 2 diabetes mellitus without complications: Secondary | ICD-10-CM

## 2018-03-15 ENCOUNTER — Other Ambulatory Visit: Payer: Self-pay | Admitting: Physician Assistant

## 2018-03-15 DIAGNOSIS — E119 Type 2 diabetes mellitus without complications: Secondary | ICD-10-CM

## 2018-04-02 ENCOUNTER — Other Ambulatory Visit: Payer: Self-pay | Admitting: Physician Assistant

## 2018-04-02 DIAGNOSIS — I10 Essential (primary) hypertension: Secondary | ICD-10-CM

## 2018-04-04 ENCOUNTER — Encounter: Payer: Self-pay | Admitting: Family Medicine

## 2018-04-04 ENCOUNTER — Ambulatory Visit: Payer: Medicare Other | Admitting: Family Medicine

## 2018-04-04 VITALS — BP 140/60 | HR 78 | Temp 97.9°F | Resp 16 | Wt 214.0 lb

## 2018-04-04 DIAGNOSIS — S90861A Insect bite (nonvenomous), right foot, initial encounter: Secondary | ICD-10-CM | POA: Diagnosis not present

## 2018-04-04 DIAGNOSIS — W57XXXA Bitten or stung by nonvenomous insect and other nonvenomous arthropods, initial encounter: Secondary | ICD-10-CM | POA: Diagnosis not present

## 2018-04-04 MED ORDER — DOXYCYCLINE HYCLATE 100 MG PO TABS: 100.0000 mg | ORAL_TABLET | Freq: Two times a day (BID) | ORAL | 0 refills | Status: DC

## 2018-04-04 NOTE — Patient Instructions (Signed)
Let us know if new symptoms or not improving. Cleanse daily with soap and water.

## 2018-04-04 NOTE — Progress Notes (Signed)
  Subjective:     Patient ID: Nicholas Nolan, male   DOB: 1951-10-11, 67 y.o.   MRN: [id number] Chief Complaint  Patient presents with  . Rash    tick bite on top of right foot   HPI States he removed a tick (of unknown duration on his foot) 1.5 weeks ago. Since then has developed multiple areas of rash at the site of the bite and adjacent areas of his foot. States rashes had central  white area at onset. Also reports mild aches and chlls.  Review of Systems     Objective:   Physical Exam  Constitutional: He appears well-developed and well-nourished. No distress.  Skin:  Dorsum of right foot with several discrete annular areas of erythema of equal size. Two appear mildly traumatized (?sock abrasion).       Assessment:    1. Insect bite of right foot, initial encounter: ? erythema migrans: start doxycycline x two weeks.    Plan:    Further f/u prn. Not improving or new sx.

## 2018-04-10 IMAGING — CR DG SHOULDER 2+V*R*
1 series · 3 of 3 positions shown · non-contrast
Comparison: None.

CLINICAL DATA: Right shoulder pain with limited range of motion. No
acute trauma

EXAM:
RIGHT SHOULDER - 2+ VIEW

[Series 1: dg shoulder right · 0.14mm/px · 3 of 3 slices shown]
[im 1/3]
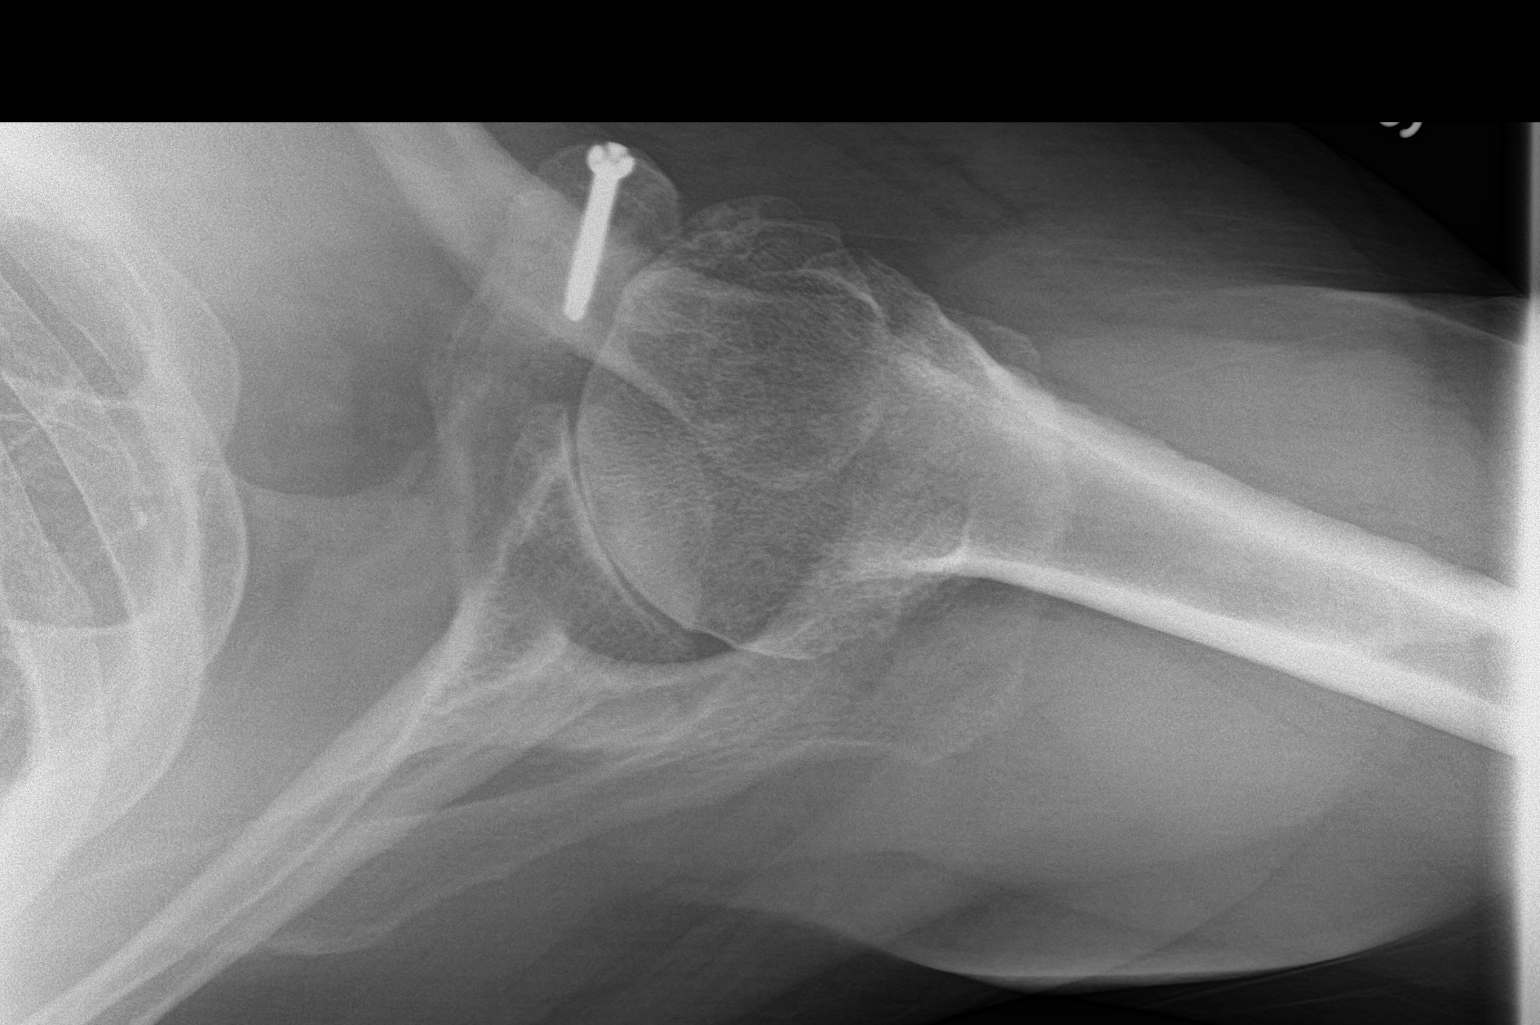
[im 2/3]
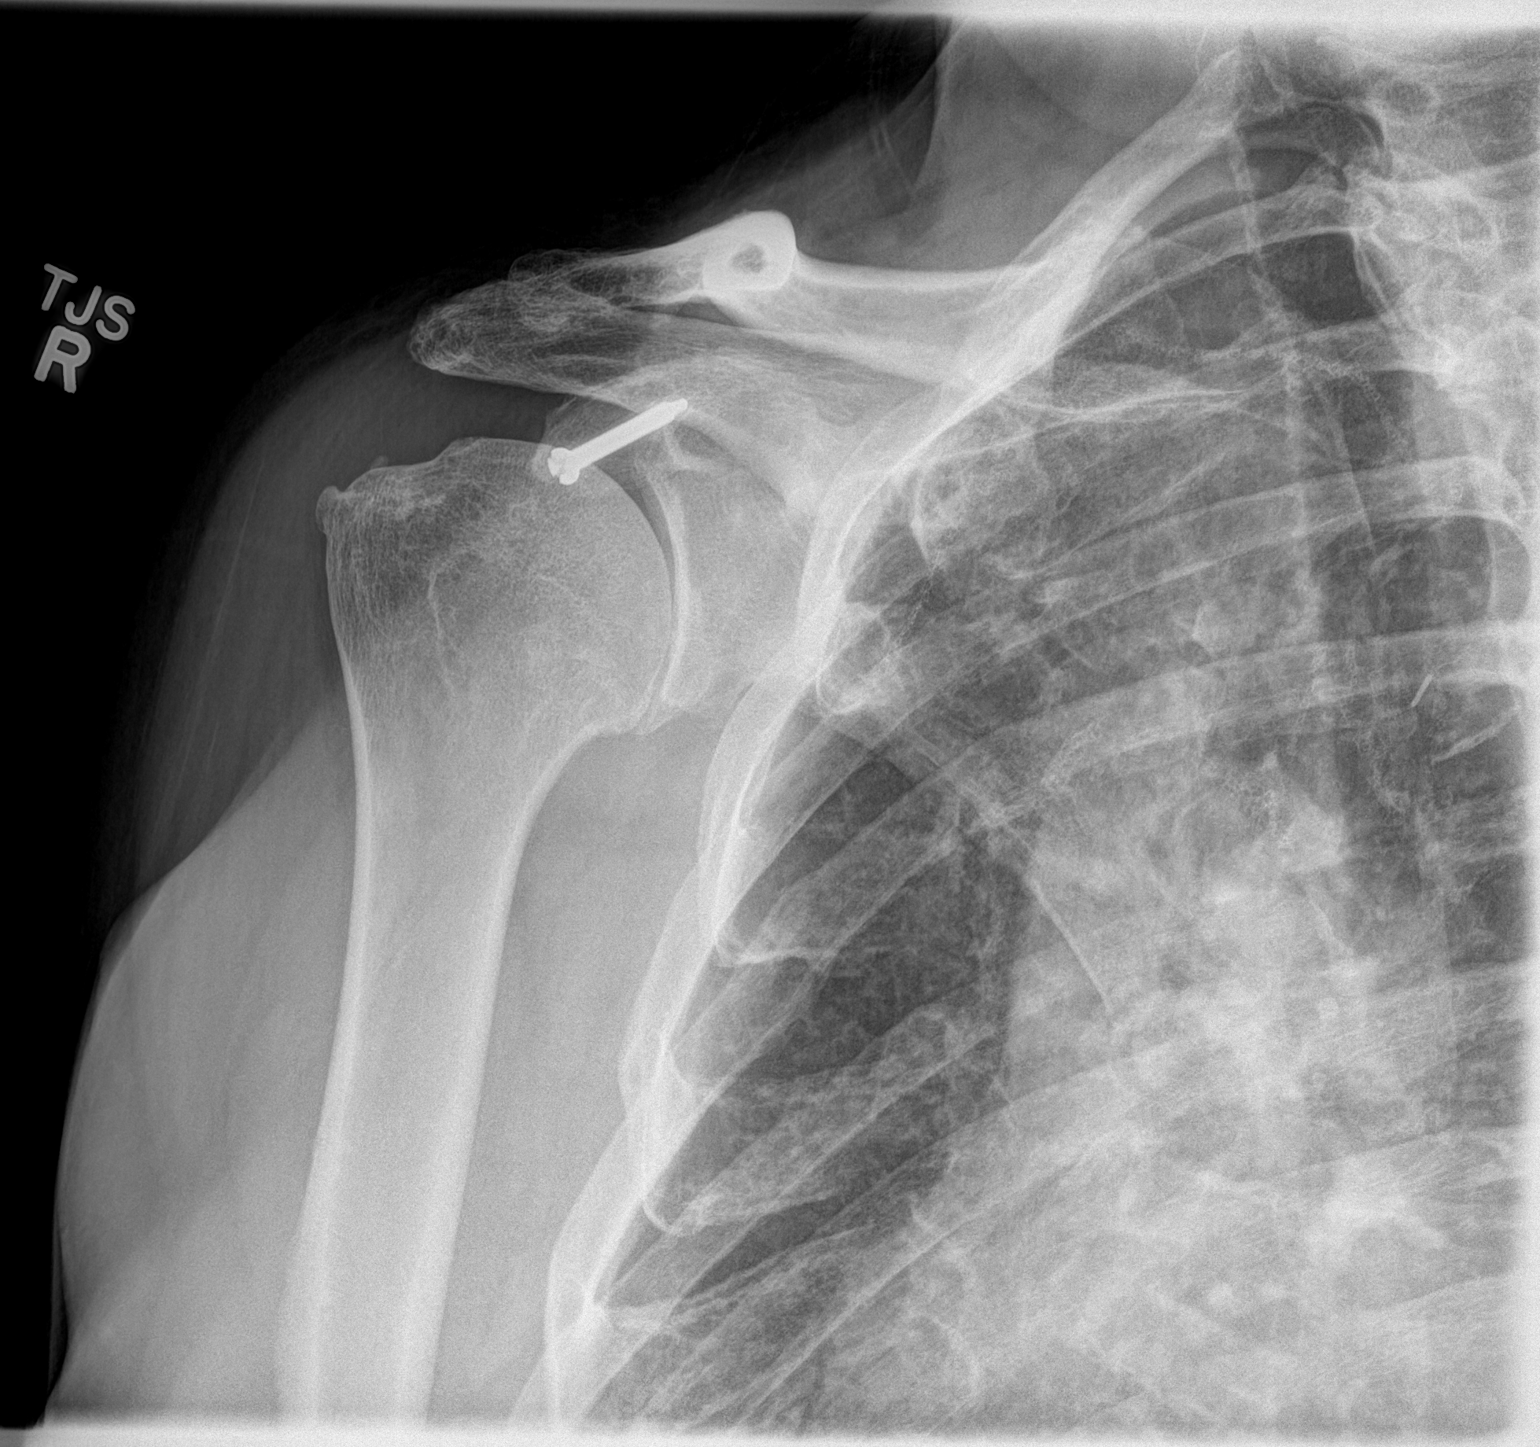
[im 3/3]
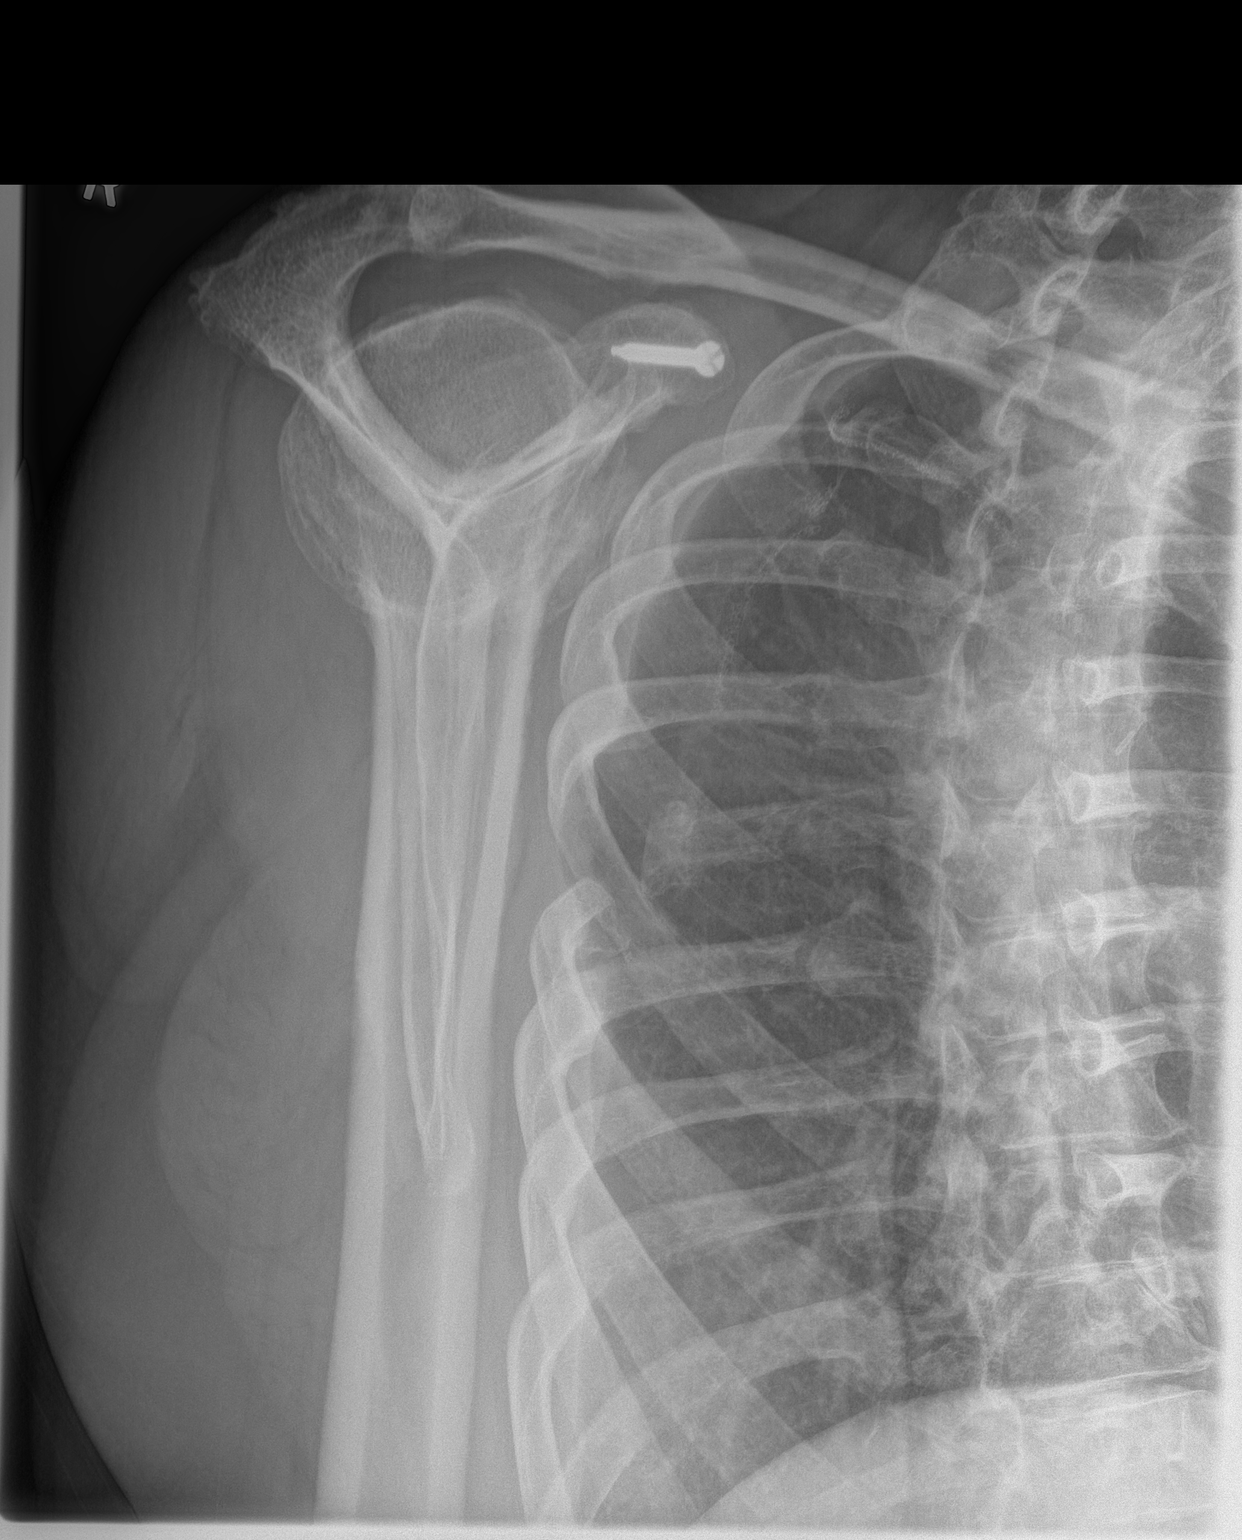

[3 of 3 positions shown; findings below may reference images not displayed]

FINDINGS: There is moderate degenerative joint disease the right shoulder with
loss of joint space and sclerosis with spurring. A single screw
overlies the superior labrum. There is irregularity at the insertion
of the rotator cuff which may indicate chronic rotator cuff disease.
The right AC joint is normally aligned. Partial resection of the
posterolateral right sixth rib is noted.
IMPRESSION: Moderate degenerative joint disease of the right shoulder for age.
No acute abnormality.

## 2018-04-10 IMAGING — CR DG CERVICAL SPINE COMPLETE 4+V
1 series · 7 of 7 positions shown · non-contrast
Comparison: 07/27/2012

CLINICAL DATA: Numbness and tingling right hand

EXAM:
CERVICAL SPINE - COMPLETE 4+ VIEW

[Series 1: dg cervical spine complete · 0.14mm/px · 7 of 7 slices shown]
[im 1/7]
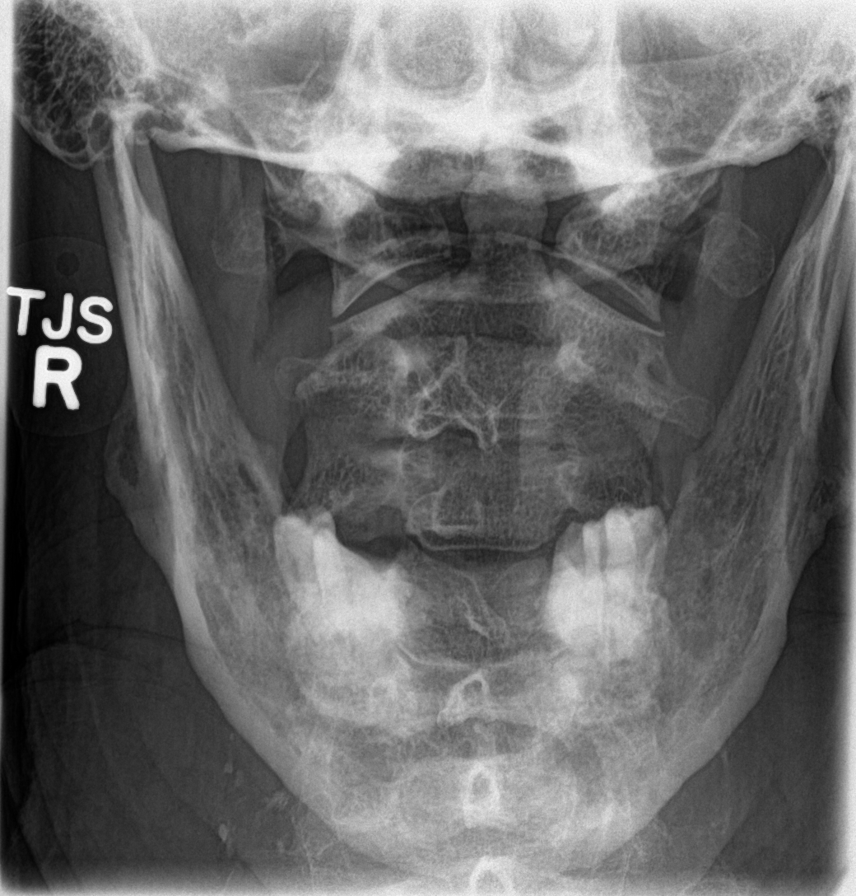
[im 2/7]
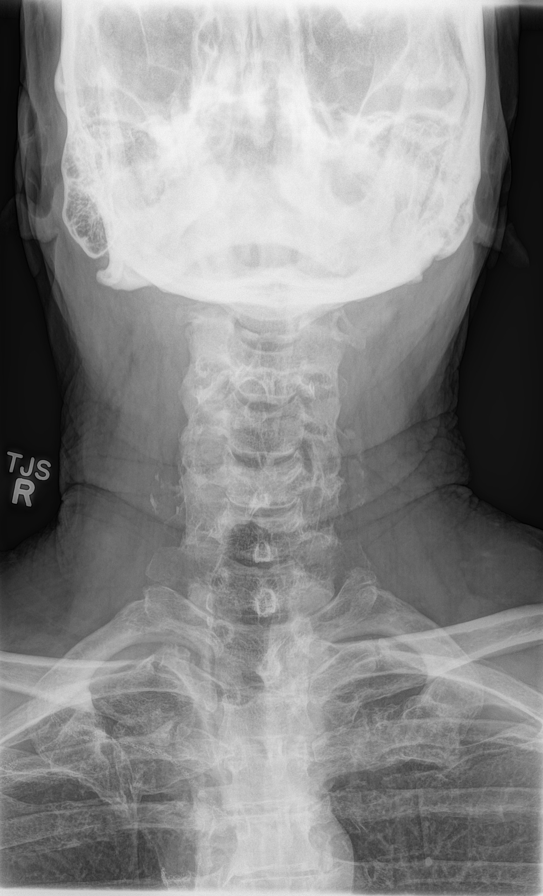
[im 3/7]
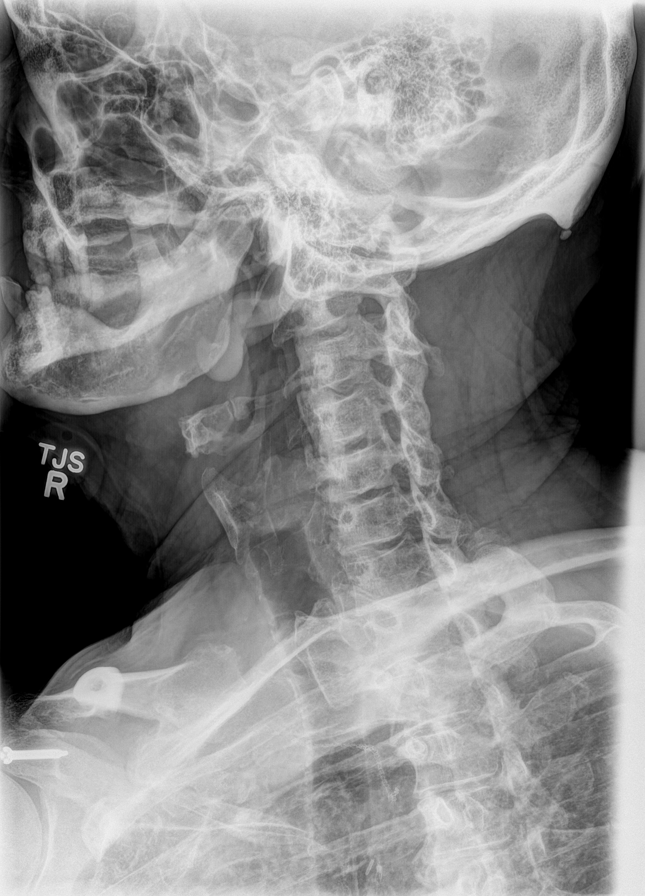
[im 4/7]
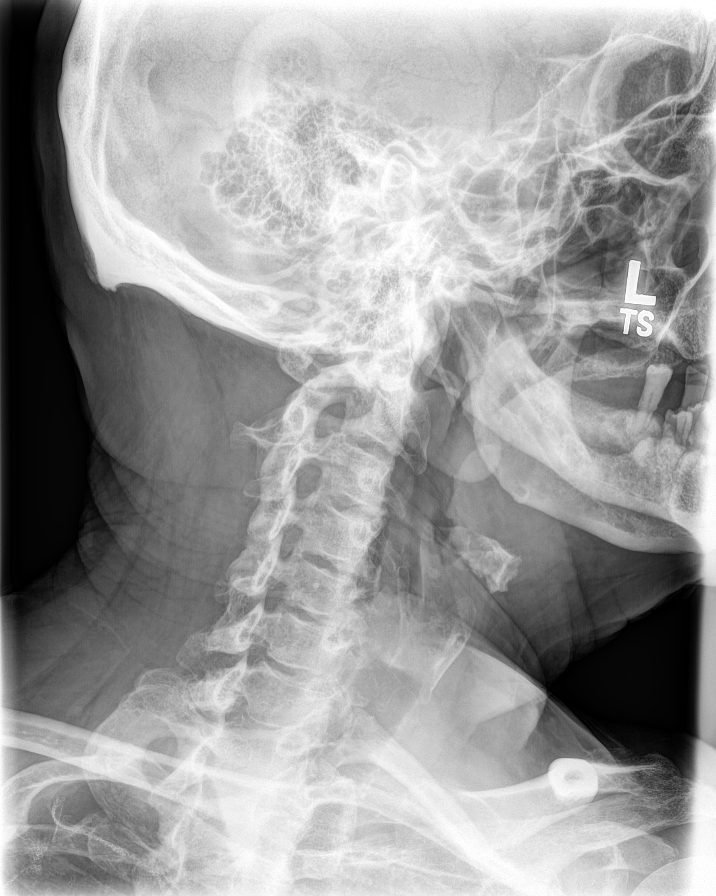
[im 5/7]
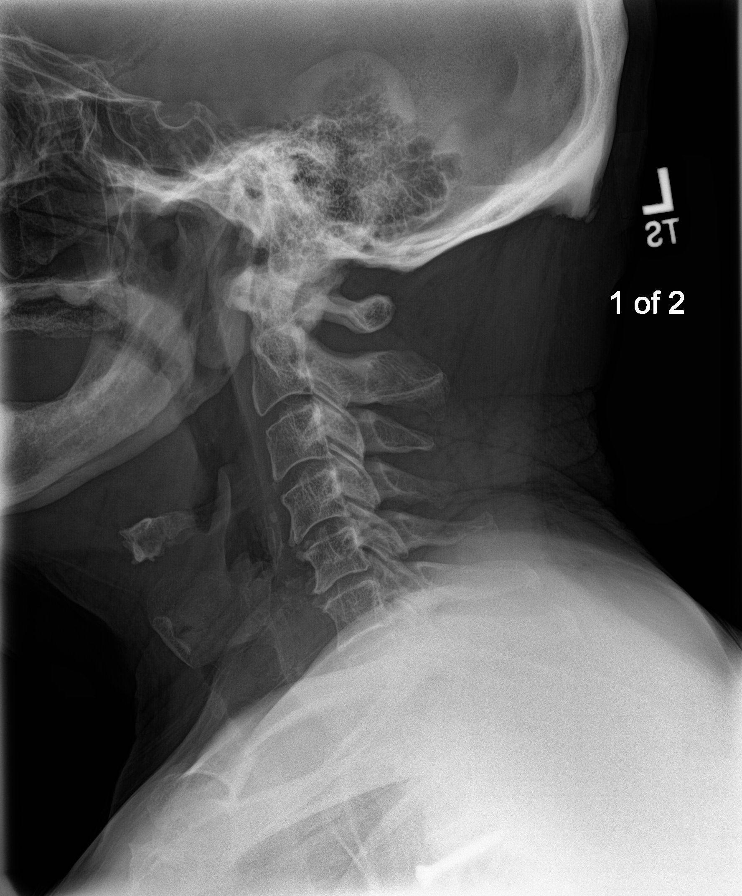
[im 6/7]
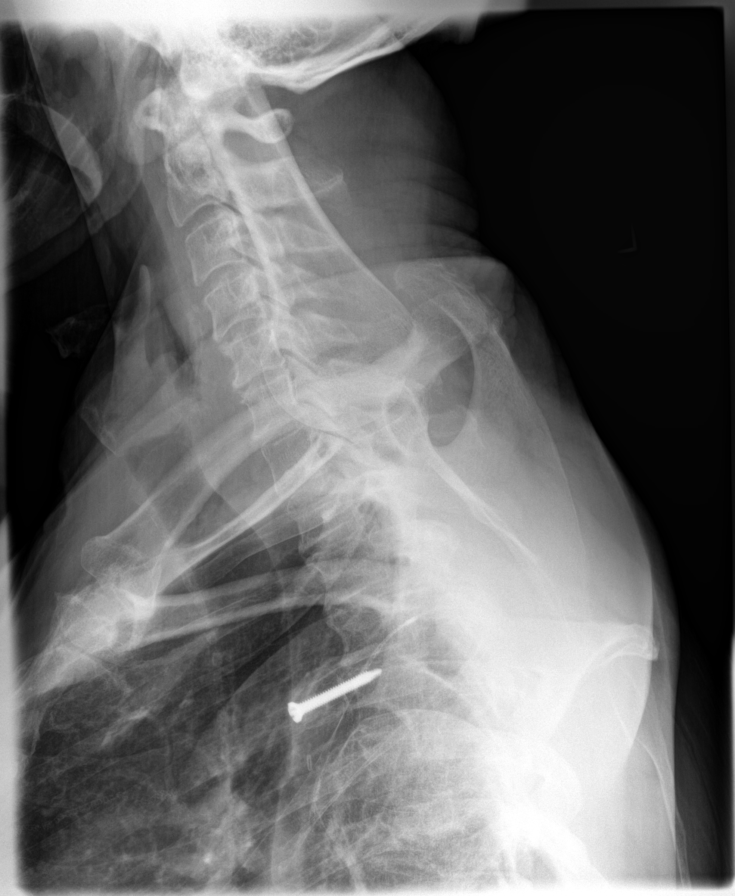
[im 7/7]
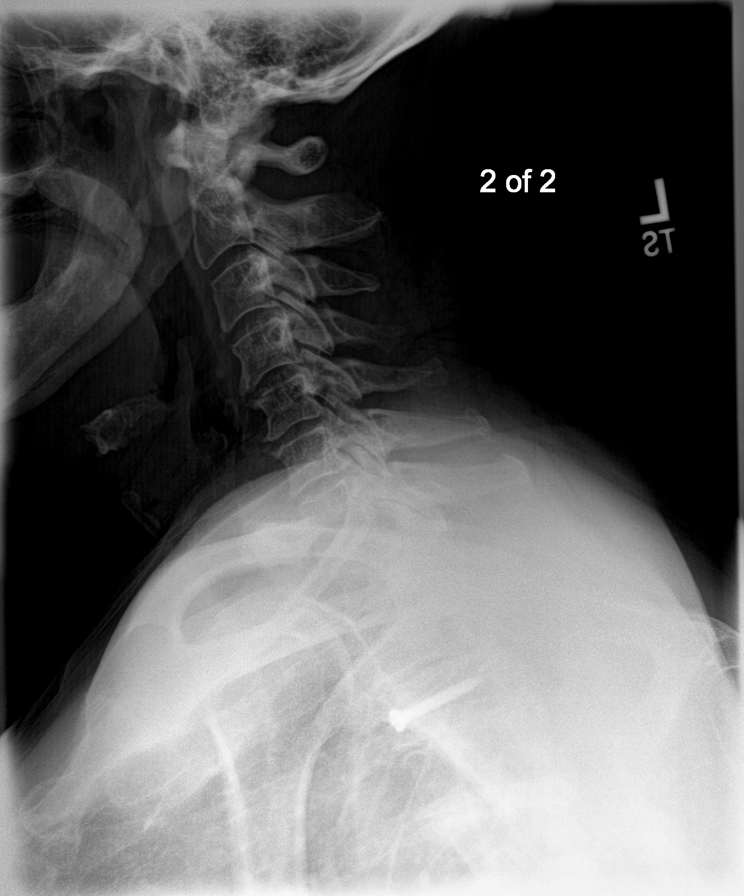

[7 of 7 positions shown; findings below may reference images not displayed]

FINDINGS: 7 views of the cervical spine submitted. No acute fracture or
subluxation. C1-C2 relationship is unremarkable. There is mild disc
space flattening with anterior spurring at C4-C5 and C5-C6 level. No
prevertebral soft tissue swelling. Cervical airway is patent.
IMPRESSION: No acute fracture or subluxation. Mild degenerative changes at C4-C5
and C5-C6 level.

## 2018-05-18 LAB — FECAL OCCULT BLOOD, GUAIAC: Fecal Occult Blood: NEGATIVE

## 2018-06-01 ENCOUNTER — Encounter: Payer: Self-pay | Admitting: Physician Assistant

## 2018-06-12 ENCOUNTER — Other Ambulatory Visit: Payer: Self-pay | Admitting: Physician Assistant

## 2018-06-12 DIAGNOSIS — I10 Essential (primary) hypertension: Secondary | ICD-10-CM

## 2018-07-21 ENCOUNTER — Telehealth: Payer: Self-pay | Admitting: Physician Assistant

## 2018-07-21 NOTE — Telephone Encounter (Signed)
I left a message asking the pt to call me at [phone] to schedule AWV w/ [facility] McKenzie. Last AWV VDM (DD)

## 2018-08-09 NOTE — Telephone Encounter (Signed)
I spoke with the patient to schedule AWV-I w/ McKenzie.  He stated that now is not a good time because his dad is in hospice.  He agreed to a call back in early December. VDM (DD)

## 2018-10-13 ENCOUNTER — Other Ambulatory Visit: Payer: Self-pay | Admitting: Physician Assistant

## 2018-10-13 DIAGNOSIS — I10 Essential (primary) hypertension: Secondary | ICD-10-CM

## 2018-11-19 ENCOUNTER — Other Ambulatory Visit: Payer: Self-pay | Admitting: Physician Assistant

## 2018-11-19 DIAGNOSIS — E119 Type 2 diabetes mellitus without complications: Secondary | ICD-10-CM

## 2018-12-04 ENCOUNTER — Ambulatory Visit (INDEPENDENT_AMBULATORY_CARE_PROVIDER_SITE_OTHER): Payer: Medicare Other

## 2018-12-04 VITALS — BP 166/58 | HR 70 | Temp 98.2°F | Ht 69.0 in | Wt 213.2 lb

## 2018-12-04 DIAGNOSIS — Z0000 Encounter for general adult medical examination without abnormal findings: Secondary | ICD-10-CM

## 2018-12-04 NOTE — Patient Instructions (Signed)
[name] , Thank you for taking time to come for your Medicare Wellness Visit. I appreciate your ongoing commitment to your health goals. Please review the following plan we discussed and let me know if I can assist you in the future.   Screening recommendations/referrals: Colonoscopy: Declined colonoscopy referral. FOBT up to date, due 05/2019. Recommended yearly ophthalmology/optometry visit for glaucoma screening and checkup Recommended yearly dental visit for hygiene and checkup  Vaccinations: Influenza vaccine: Pt declines today.  Pneumococcal vaccine: Pt declines today.  Tdap vaccine: Up to date, due 07/2027 Shingles vaccine: Pt declines today.     Advanced directives: Advance directive discussed with you today. Even though you declined this today please call our office should you change your mind and we can give you the proper paperwork for you to fill out.  Conditions/risks identified: Obesity- recommend to continue current diet plan of cutting out all bad carbohydrates and sugar in diet to help aid in weight loss.   Next appointment: 12/11/18 @ 10:00 AM with Nicholas Nolan.   Preventive Care 68 Years and Older, Male Preventive care refers to lifestyle choices and visits with your health care provider that can promote health and wellness. What does preventive care include?  A yearly physical exam. This is also called an annual well check.  Dental exams once or twice a year.  Routine eye exams. Ask your health care provider how often you should have your eyes checked.  Personal lifestyle choices, including:  Daily care of your teeth and gums.  Regular physical activity.  Eating a healthy diet.  Avoiding tobacco and drug use.  Limiting alcohol use.  Practicing safe sex.  Taking low doses of aspirin every day.  Taking vitamin and mineral supplements as recommended by your health care provider. What happens during an annual well check? The services and  screenings done by your health care provider during your annual well check will depend on your age, overall health, lifestyle risk factors, and family history of disease. Counseling  Your health care provider may ask you questions about your:  Alcohol use.  Tobacco use.  Drug use.  Emotional well-being.  Home and relationship well-being.  Sexual activity.  Eating habits.  History of falls.  Memory and ability to understand (cognition).  Work and work Statistician. Screening  You may have the following tests or measurements:  Height, weight, and BMI.  Blood pressure.  Lipid and cholesterol levels. These may be checked every 5 years, or more frequently if you are over 29 years old.  Skin check.  Lung cancer screening. You may have this screening every year starting at age 47 if you have a 30-pack-year history of smoking and currently smoke or have quit within the past 15 years.  Fecal occult blood test (FOBT) of the stool. You may have this test every year starting at age 22.  Flexible sigmoidoscopy or colonoscopy. You may have a sigmoidoscopy every 5 years or a colonoscopy every 10 years starting at age 81.  Prostate cancer screening. Recommendations will vary depending on your family history and other risks.  Hepatitis C blood test.  Hepatitis B blood test.  Sexually transmitted disease (STD) testing.  Diabetes screening. This is done by checking your blood sugar (glucose) after you have not eaten for a while (fasting). You may have this done every 1-3 years.  Abdominal aortic aneurysm (AAA) screening. You may need this if you are a current or former smoker.  Osteoporosis. You may be screened starting at  age 8 if you are at high risk. Talk with your health care provider about your test results, treatment options, and if necessary, the need for more tests. Vaccines  Your health care provider may recommend certain vaccines, such as:  Influenza vaccine. This is  recommended every year.  Tetanus, diphtheria, and acellular pertussis (Tdap, Td) vaccine. You may need a Td booster every 10 years.  Zoster vaccine. You may need this after age 30.  Pneumococcal 13-valent conjugate (PCV13) vaccine. One dose is recommended after age 58.  Pneumococcal polysaccharide (PPSV23) vaccine. One dose is recommended after age 85. Talk to your health care provider about which screenings and vaccines you need and how often you need them. This information is not intended to replace advice given to you by your health care provider. Make sure you discuss any questions you have with your health care provider. Document Released: 11/21/2015 Document Revised: 07/14/2016 Document Reviewed: 08/26/2015 Elsevier Interactive Patient Education  2017 [employer] Prevention in the Home Falls can cause injuries. They can happen to people of all ages. There are many things you can do to make your home safe and to help prevent falls. What can I do on the outside of my home?  Regularly fix the edges of walkways and driveways and fix any cracks.  Remove anything that might make you trip as you walk through a door, such as a raised step or threshold.  Trim any bushes or trees on the path to your home.  Use bright outdoor lighting.  Clear any walking paths of anything that might make someone trip, such as rocks or tools.  Regularly check to see if handrails are loose or broken. Make sure that both sides of any steps have handrails.  Any raised decks and porches should have guardrails on the edges.  Have any leaves, snow, or ice cleared regularly.  Use sand or salt on walking paths during winter.  Clean up any spills in your garage right away. This includes oil or grease spills. What can I do in the bathroom?  Use night lights.  Install grab bars by the toilet and in the tub and shower. Do not use towel bars as grab bars.  Use non-skid mats or decals in the tub or  shower.  If you need to sit down in the shower, use a plastic, non-slip stool.  Keep the floor dry. Clean up any water that spills on the floor as soon as it happens.  Remove soap buildup in the tub or shower regularly.  Attach bath mats securely with double-sided non-slip rug tape.  Do not have throw rugs and other things on the floor that can make you trip. What can I do in the bedroom?  Use night lights.  Make sure that you have a light by your bed that is easy to reach.  Do not use any sheets or blankets that are too big for your bed. They should not hang down onto the floor.  Have a firm chair that has side arms. You can use this for support while you get dressed.  Do not have throw rugs and other things on the floor that can make you trip. What can I do in the kitchen?  Clean up any spills right away.  Avoid walking on wet floors.  Keep items that you use a lot in easy-to-reach places.  If you need to reach something above you, use a strong step stool that has a grab bar.  Keep electrical cords out of the way.  Do not use floor polish or wax that makes floors slippery. If you must use wax, use non-skid floor wax.  Do not have throw rugs and other things on the floor that can make you trip. What can I do with my stairs?  Do not leave any items on the stairs.  Make sure that there are handrails on both sides of the stairs and use them. Fix handrails that are broken or loose. Make sure that handrails are as long as the stairways.  Check any carpeting to make sure that it is firmly attached to the stairs. Fix any carpet that is loose or worn.  Avoid having throw rugs at the top or bottom of the stairs. If you do have throw rugs, attach them to the floor with carpet tape.  Make sure that you have a light switch at the top of the stairs and the bottom of the stairs. If you do not have them, ask someone to add them for you. What else can I do to help prevent  falls?  Wear shoes that:  Do not have high heels.  Have rubber bottoms.  Are comfortable and fit you well.  Are closed at the toe. Do not wear sandals.  If you use a stepladder:  Make sure that it is fully opened. Do not climb a closed stepladder.  Make sure that both sides of the stepladder are locked into place.  Ask someone to hold it for you, if possible.  Clearly mark and make sure that you can see:  Any grab bars or handrails.  First and last steps.  Where the edge of each step is.  Use tools that help you move around (mobility aids) if they are needed. These include:  Canes.  Walkers.  Scooters.  Crutches.  Turn on the lights when you go into a dark area. Replace any light bulbs as soon as they burn out.  Set up your furniture so you have a clear path. Avoid moving your furniture around.  If any of your floors are uneven, fix them.  If there are any pets around you, be aware of where they are.  Review your medicines with your doctor. Some medicines can make you feel dizzy. This can increase your chance of falling. Ask your doctor what other things that you can do to help prevent falls. This information is not intended to replace advice given to you by your health care provider. Make sure you discuss any questions you have with your health care provider. Document Released: 08/21/2009 Document Revised: 04/01/2016 Document Reviewed: 11/29/2014 Elsevier Interactive Patient Education  2017 [employer].

## 2018-12-04 NOTE — Progress Notes (Signed)
Subjective:   Nicholas Nolan is a 68 y.o. male who presents for an Initial Medicare Annual Wellness Visit.  Review of Systems  N/A  Cardiac Risk Factors include: advanced age (>28men, >73 women);diabetes mellitus;dyslipidemia;hypertension;male gender;obesity (BMI >30kg/m2)    Objective:    Today's Vitals   12/04/18 1041 12/04/18 1108  BP: (!) 188/64 (!) 166/58  Pulse: 70   Temp: 98.2 F (36.8 C)   TempSrc: Oral   Weight: 213 lb 3.2 oz (96.7 kg)   Height: 5\' 9"  (1.753 m)   PainSc: 0-No pain    Body mass index is 31.48 kg/m.  Advanced Directives 12/04/2018 01/07/2012  Does Patient Have a Medical Advance Directive? No Patient does not have advance directive;Patient would not like information  Would patient like information on creating a medical advance directive? No - Patient declined -  Pre-existing out of facility DNR order (yellow form or pink MOST form) - No    Current Medications (verified) Outpatient Encounter Medications as of 12/04/2018  Medication Sig  . amLODipine (NORVASC) 2.5 MG tablet Take 1 tablet (2.5 mg total) by mouth daily.  . Dulaglutide (TRULICITY) 1.5 GY/6.9SW SOPN Inject 1.5 mg into the skin once a week.   . enalapril (VASOTEC) 20 MG tablet TAKE 1 TABLET BY MOUTH TWICE A DAY  . glimepiride (AMARYL) 4 MG tablet TAKE 1 TABLET BY MOUTH EVERY DAY  . hydrochlorothiazide (HYDRODIURIL) 25 MG tablet TAKE 1 TABLET BY MOUTH EVERY DAY  . metFORMIN (GLUCOPHAGE) 1000 MG tablet TAKE ONE TABLET BY MOUTH TWICE A DAY WITH A MEAL  . simvastatin (ZOCOR) 20 MG tablet TAKE 1 TABLET BY MOUTH EVERY DAY IN THE EVENING  . atorvastatin (LIPITOR) 10 MG tablet Take 1 tablet (10 mg total) by mouth daily. (Patient not taking: Reported on 12/04/2018)  . doxycycline (VIBRA-TABS) 100 MG tablet Take 1 tablet (100 mg total) by mouth 2 (two) times daily. (Patient not taking: Reported on 12/04/2018)   No facility-administered encounter medications on file as of 12/04/2018.     Allergies  (verified) Codeine; Hydromorphone; and Morphine and related   History: Past Medical History:  Diagnosis Date  . Back pain    leg pain  . Diabetes mellitus    type 2  . Hyperlipidemia   . Hypertension   . Neuropathy    feet   Past Surgical History:  Procedure Laterality Date  . BASAL CELL CARCINOMA EXCISION    . LUMBAR LAMINECTOMY/DECOMPRESSION MICRODISCECTOMY  01/10/2012   Procedure: LUMBAR LAMINECTOMY/DECOMPRESSION MICRODISCECTOMY 2 LEVELS;  Surgeon: Hosie Spangle, MD;  Location: [facility];  Service: Neurosurgery;  Laterality: Bilateral;  Lumbar four-Sacral One laminectomies  . LUNG SURGERY Right 11/22/12   right upper lobe  . PROSTATE SURGERY     biopsy-due to elelvated PSA  . SHOULDER SURGERY    . VASECTOMY     Family History  Problem Relation Age of Onset  . Cancer Father        prostate  . Heart disease Father        MI  . Stroke Father   . Cancer Brother        liver   Social History   Socioeconomic History  . Marital status: Married    Spouse name: Not on file  . Number of children: 4  . Years of education: Not on file  . Highest education level: Associate degree: occupational, Hotel manager, or vocational program  Occupational History  . Not on file  Social Needs  .  Financial resource strain: Not hard at all  . Food insecurity:    Worry: Never true    Inability: Never true  . Transportation needs:    Medical: No    Non-medical: Not on file  Tobacco Use  . Smoking status: Former Smoker    Packs/day: 2.00    Years: 45.00    Pack years: 90.00    Types: Cigarettes  . Smokeless tobacco: Current User    Types: Snuff  . Tobacco comment: Quit in 2015  Substance and Sexual Activity  . Alcohol use: No  . Drug use: No  . Sexual activity: Not on file  Lifestyle  . Physical activity:    Days per week: 0 days    Minutes per session: 0 min  . Stress: Not at all  Relationships  . Social connections:    Talks on phone: Patient refused    Gets  together: Patient refused    Attends religious service: Patient refused    Active member of club or organization: Patient refused    Attends meetings of clubs or organizations: Patient refused    Relationship status: Patient refused  Other Topics Concern  . Not on file  Social History Narrative  . Not on file   Tobacco Counseling Ready to quit: Not Answered Counseling given: Not Answered Comment: Quit in 2015   Clinical Intake:  Pre-visit preparation completed: Yes  Pain : No/denies pain Pain Score: 0-No pain    Diabetes:  Is the patient diabetic? Yes, type 2 If diabetic, was a CBG obtained today?  No  Did the patient bring in their glucometer from home?  No  How often do you monitor your CBG's? 3-4 xs weekly.   Financial Strains and Diabetes Management:  Are you having any financial strains with the device, your supplies or your medication? No .  Does the patient want to be seen by Chronic Care Management for management of their diabetes?  No  Would the patient like to be referred to a Nutritionist or for Diabetic Management?  No   Diabetic Exams:  Diabetic Eye Exam: Completed with Dr Ellin Mayhew in 05/2018. Requested records to be sent to clinic.   Diabetic Foot Exam: Completed 11/24/18 with Dr Gabriel Carina.    Nutritional Status: BMI > 30  Obese Nutritional Risks: None   How often do you need to have someone help you when you read instructions, pamphlets, or other written materials from your doctor or pharmacy?: 1 - Never  Interpreter Needed?: No  Information entered by :: [facility], LPN  Activities of Daily Living In your present state of health, do you have any difficulty performing the following activities: 12/04/2018  Hearing? Y  Comment Has hearing loss. Does not wear hearing aids.   Vision? N  Comment Wears readers as needed.   Difficulty concentrating or making decisions? N  Walking or climbing stairs? N  Dressing or bathing? N  Doing errands, shopping? N    Preparing Food and eating ? N  Using the Toilet? N  In the past six months, have you accidently leaked urine? N  Do you have problems with loss of bowel control? N  Managing your Medications? N  Managing your Finances? N  Housekeeping or managing your Housekeeping? N  Some recent data might be hidden     Immunizations and Health Maintenance Immunization History  Administered Date(s) Administered  . Td 06/07/2007  . Tdap 06/07/2007, 07/17/2017   Health Maintenance Due  Topic Date Due  .  Hepatitis C Screening  1951/04/23  . OPHTHALMOLOGY EXAM  04/05/1961  . COLONOSCOPY  04/05/2001  . PNA vac Low Risk Adult (1 of 2 - PCV13) 04/05/2016    Patient Care Team: Mar Daring, PA-C as PCP - General (Family Medicine) Anell Barr, OD (Optometry) Solum, Betsey Holiday, MD as Physician Assistant (Endocrinology)  Indicate any recent Medical Services you may have received from other than [facility] providers in the past year (date may be approximate).    Assessment:   This is a routine wellness examination for Raj.  Hearing/Vision screen Vision Screening Comments: Pt sees Dr Ellin Mayhew for vision checks yearly.   Dietary issues and exercise activities discussed: Current Exercise Habits: The patient does not participate in regular exercise at present, Exercise limited by: None identified  Goals    . DIET - REDUCE SUGAR INTAKE     Recommend to continue current diet plan of cutting out all bad carbohydrates and sugar in diet to help aid in weight loss.       Depression Screen PHQ 2/9 Scores 12/04/2018 12/04/2018 11/15/2016  PHQ - 2 Score 0 0 0  PHQ- 9 Score 0 - -    Fall Risk Fall Risk  12/04/2018 11/15/2016  Falls in the past year? 0 No    FALL RISK PREVENTION PERTAINING TO THE HOME:  Any stairs in or around the home WITH handrails? Yes  Home free of loose throw rugs in walkways, pet beds, electrical cords, etc? Yes  Adequate lighting in your home to reduce risk of falls? Yes    ASSISTIVE DEVICES UTILIZED TO PREVENT FALLS:  Life alert? No  Use of a cane, walker or w/c? No  Grab bars in the bathroom? No  Shower chair or bench in shower? No  Elevated toilet seat or a handicapped toilet? Yes    TIMED UP AND GO:  Was the test performed? No .     Cognitive Function:     6CIT Screen 12/04/2018  What Year? 0 points  What month? 0 points  What time? 0 points  Count back from 20 0 points  Months in reverse 0 points  Repeat phrase 0 points  Total Score 0    Screening Tests Health Maintenance  Topic Date Due  . Hepatitis C Screening  20-Nov-1950  . OPHTHALMOLOGY EXAM  04/05/1961  . COLONOSCOPY  04/05/2001  . PNA vac Low Risk Adult (1 of 2 - PCV13) 04/05/2016  . INFLUENZA VACCINE  02/06/2019 (Originally 06/08/2018)  . COLON CANCER SCREENING ANNUAL FOBT  05/19/2019  . HEMOGLOBIN A1C  05/25/2019  . FOOT EXAM  11/25/2019  . TETANUS/TDAP  07/18/2027    Qualifies for Shingles Vaccine? Yes . Due for Shingrix. Education has been provided regarding the importance of this vaccine. Pt has been advised to call insurance company to determine out of pocket expense. Advised may also receive vaccine at local pharmacy or Health Dept. Verbalized acceptance and understanding.  Tdap: Up to date  Flu Vaccine: Due for Flu vaccine. Does the patient want to receive this vaccine today?  No . Education has been provided regarding the importance of this vaccine but still declined. Advised may receive this vaccine at local pharmacy or Health Dept. Aware to provide a copy of the vaccination record if obtained from local pharmacy or Health Dept. Verbalized acceptance and understanding.  Pneumococcal Vaccine: Due for Pneumococcal vaccine. Does the patient want to receive this vaccine today?  No . Education has been provided regarding the importance of  this vaccine but still declined. Advised may receive this vaccine at local pharmacy or Health Dept. Aware to provide a copy of the  vaccination record if obtained from local pharmacy or Health Dept. Verbalized acceptance and understanding.   Cancer Screenings:  Colorectal Screening: Declined colonoscopy referral. FOBT completed 05/2018, due 05/2019.  Lung Cancer Screening: (Low Dose CT Chest recommended if Age 28-80 years, 30 pack-year currently smoking OR have quit w/in 15years.) does qualify, however declines order today.    Additional Screening:  Hepatitis C Screening: does qualify however would like this added to blood work orders at [facility] on 12/11/18.  Vision Screening: Recommended annual ophthalmology exams for early detection of glaucoma and other disorders of the eye.  Dental Screening: Recommended annual dental exams for proper oral hygiene  Community Resource Referral:  CRR required this visit?  No        Plan:  I have personally reviewed and addressed the Medicare Annual Wellness questionnaire and have noted the following in the patient's chart:  A. Medical and social history B. Use of alcohol, tobacco or illicit drugs  C. Current medications and supplements D. Functional ability and status E.  Nutritional status F.  Physical activity G. Advance directives H. List of other physicians I.  Hospitalizations, surgeries, and ER visits in previous 12 months J.  [place] such as hearing and vision if needed, cognitive and depression L. Referrals and appointments - none  In addition, I have reviewed and discussed with patient certain preventive protocols, quality metrics, and best practice recommendations. A written personalized care plan for preventive services as well as general preventive health recommendations were provided to patient.  See attached scanned questionnaire for additional information.   Signed,  Fabio Neighbors, LPN Nurse Health Advisor   Nurse Recommendations: Pt declined the influenza and pneumonia vaccines, colonoscopy referral, lung CT scan and Hep C lab  order today. Pt would like the Hep C lab added to his yearly blood work orders at [facility] on 12/11/18. Requested eye exam notes from [place] in 05/2018.

## 2018-12-11 ENCOUNTER — Encounter: Payer: Medicare Other | Admitting: Physician Assistant

## 2019-04-12 ENCOUNTER — Other Ambulatory Visit: Payer: Self-pay

## 2019-04-12 ENCOUNTER — Ambulatory Visit: Payer: Medicare Other | Admitting: Physician Assistant

## 2019-04-12 ENCOUNTER — Encounter: Payer: Self-pay | Admitting: Physician Assistant

## 2019-04-12 VITALS — BP 148/64 | HR 67 | Temp 98.0°F | Resp 16 | Wt 211.0 lb

## 2019-04-12 DIAGNOSIS — I10 Essential (primary) hypertension: Secondary | ICD-10-CM

## 2019-04-12 MED ORDER — AMLODIPINE BESYLATE 5 MG PO TABS: 5.0000 mg | ORAL_TABLET | Freq: Every day | ORAL | 3 refills | Status: DC

## 2019-04-12 NOTE — Patient Instructions (Signed)
DASH Eating Plan DASH stands for "Dietary Approaches to Stop Hypertension." The DASH eating plan is a healthy eating plan that has been shown to reduce high blood pressure (hypertension). It may also reduce your risk for type 2 diabetes, heart disease, and stroke. The DASH eating plan may also help with weight loss. What are tips for following this plan?  General guidelines  Avoid eating more than 2,300 mg (milligrams) of salt (sodium) a day. If you have hypertension, you may need to reduce your sodium intake to 1,500 mg a day.  Limit alcohol intake to no more than 1 drink a day for nonpregnant women and 2 drinks a day for men. One drink equals 12 oz of beer, 5 oz of wine, or 1 oz of hard liquor.  Work with your health care provider to maintain a healthy body weight or to lose weight. Ask what an ideal weight is for you.  Get at least 30 minutes of exercise that causes your heart to beat faster (aerobic exercise) most days of the week. Activities may include walking, swimming, or biking.  Work with your health care provider or diet and nutrition specialist (dietitian) to adjust your eating plan to your individual calorie needs. Reading food labels   Check food labels for the amount of sodium per serving. Choose foods with less than 5 percent of the Daily Value of sodium. Generally, foods with less than 300 mg of sodium per serving fit into this eating plan.  To find whole grains, look for the word "whole" as the first word in the ingredient list. Shopping  Buy products labeled as "low-sodium" or "no salt added."  Buy fresh foods. Avoid canned foods and premade or frozen meals. Cooking  Avoid adding salt when cooking. Use salt-free seasonings or herbs instead of table salt or sea salt. Check with your health care provider or pharmacist before using salt substitutes.  Do not fry foods. Cook foods using healthy methods such as baking, boiling, grilling, and broiling instead.  Cook with  heart-healthy oils, such as olive, canola, soybean, or sunflower oil. Meal planning  Eat a balanced diet that includes: ? 5 or more servings of fruits and vegetables each day. At each meal, try to fill half of your plate with fruits and vegetables. ? Up to 6-8 servings of whole grains each day. ? Less than 6 oz of lean meat, poultry, or fish each day. A 3-oz serving of meat is about the same size as a deck of cards. One egg equals 1 oz. ? 2 servings of low-fat dairy each day. ? A serving of nuts, seeds, or beans 5 times each week. ? Heart-healthy fats. Healthy fats called Omega-3 fatty acids are found in foods such as flaxseeds and coldwater fish, like sardines, salmon, and mackerel.  Limit how much you eat of the following: ? Canned or prepackaged foods. ? Food that is high in trans fat, such as fried foods. ? Food that is high in saturated fat, such as fatty meat. ? Sweets, desserts, sugary drinks, and other foods with added sugar. ? Full-fat dairy products.  Do not salt foods before eating.  Try to eat at least 2 vegetarian meals each week.  Eat more home-cooked food and less restaurant, buffet, and fast food.  When eating at a restaurant, ask that your food be prepared with less salt or no salt, if possible. What foods are recommended? The items listed may not be a complete list. Talk with your dietitian about  what dietary choices are best for you. Grains Whole-grain or whole-wheat bread. Whole-grain or whole-wheat pasta. Brown rice. [place]. Bulgur. Whole-grain and low-sodium cereals. Pita bread. Low-fat, low-sodium crackers. Whole-wheat flour tortillas. Vegetables Fresh or frozen vegetables (raw, steamed, roasted, or grilled). Low-sodium or reduced-sodium tomato and vegetable juice. Low-sodium or reduced-sodium tomato sauce and tomato paste. Low-sodium or reduced-sodium canned vegetables. Fruits All fresh, dried, or frozen fruit. Canned fruit in natural juice (without  added sugar). Meat and other protein foods Skinless chicken or [place]. Ground chicken or [place]. Pork with fat trimmed off. Fish and seafood. Egg whites. Dried beans, peas, or lentils. Unsalted nuts, nut butters, and seeds. Unsalted canned beans. Lean cuts of beef with fat trimmed off. Low-sodium, lean deli meat. Dairy Low-fat (1%) or fat-free (skim) milk. Fat-free, low-fat, or reduced-fat cheeses. Nonfat, low-sodium ricotta or cottage cheese. Low-fat or nonfat yogurt. Low-fat, low-sodium cheese. Fats and oils Soft margarine without trans fats. Vegetable oil. Low-fat, reduced-fat, or light mayonnaise and salad dressings (reduced-sodium). Canola, safflower, olive, soybean, and sunflower oils. Avocado. Seasoning and other foods Herbs. Spices. Seasoning mixes without salt. Unsalted popcorn and pretzels. Fat-free sweets. What foods are not recommended? The items listed may not be a complete list. Talk with your dietitian about what dietary choices are best for you. Grains Baked goods made with fat, such as croissants, muffins, or some breads. Dry pasta or rice meal packs. Vegetables Creamed or fried vegetables. Vegetables in a cheese sauce. Regular canned vegetables (not low-sodium or reduced-sodium). Regular canned tomato sauce and paste (not low-sodium or reduced-sodium). Regular tomato and vegetable juice (not low-sodium or reduced-sodium). Angie Fava. Olives. Fruits Canned fruit in a light or heavy syrup. Fried fruit. Fruit in cream or butter sauce. Meat and other protein foods Fatty cuts of meat. Ribs. Fried meat. Berniece Salines. Sausage. Bologna and other processed lunch meats. Salami. Fatback. Hotdogs. Bratwurst. Salted nuts and seeds. Canned beans with added salt. Canned or smoked fish. Whole eggs or egg yolks. Chicken or [place] with skin. Dairy Whole or 2% milk, cream, and half-and-half. Whole or full-fat cream cheese. Whole-fat or sweetened yogurt. Full-fat cheese. Nondairy creamers. Whipped toppings.  Processed cheese and cheese spreads. Fats and oils Butter. Stick margarine. Lard. Shortening. Ghee. Bacon fat. Tropical oils, such as coconut, palm kernel, or palm oil. Seasoning and other foods Salted popcorn and pretzels. Onion salt, garlic salt, seasoned salt, table salt, and sea salt. Worcestershire sauce. Tartar sauce. Barbecue sauce. Teriyaki sauce. Soy sauce, including reduced-sodium. Steak sauce. Canned and packaged gravies. Fish sauce. Oyster sauce. Cocktail sauce. Horseradish that you find on the shelf. Ketchup. Mustard. Meat flavorings and tenderizers. Bouillon cubes. Hot sauce and Tabasco sauce. Premade or packaged marinades. Premade or packaged taco seasonings. Relishes. Regular salad dressings. Where to find more information:  National Heart, Lung, and [place]: https://wilson-eaton.com/  American Heart Association: www.heart.org Summary  The DASH eating plan is a healthy eating plan that has been shown to reduce high blood pressure (hypertension). It may also reduce your risk for type 2 diabetes, heart disease, and stroke.  With the DASH eating plan, you should limit salt (sodium) intake to 2,300 mg a day. If you have hypertension, you may need to reduce your sodium intake to 1,500 mg a day.  When on the DASH eating plan, aim to eat more fresh fruits and vegetables, whole grains, lean proteins, low-fat dairy, and heart-healthy fats.  Work with your health care provider or diet and nutrition specialist (dietitian) to adjust your eating plan to your  individual calorie needs. This information is not intended to replace advice given to you by your health care provider. Make sure you discuss any questions you have with your health care provider. Document Released: 10/14/2011 Document Revised: 10/18/2016 Document Reviewed: 10/18/2016 Elsevier Interactive Patient Education  2019 Elsevier Inc.   Amlodipine tablets What is this medicine? AMLODIPINE (am LOE di peen) is a  calcium-channel blocker. It affects the amount of calcium found in your heart and muscle cells. This relaxes your blood vessels, which can reduce the amount of work the heart has to do. This medicine is used to lower high blood pressure. It is also used to prevent chest pain. This medicine may be used for other purposes; ask your health care provider or pharmacist if you have questions. COMMON BRAND NAME(S): Norvasc What should I tell my health care provider before I take this medicine? They need to know if you have any of these conditions: -heart disease -liver disease -an unusual or allergic reaction to amlodipine, other medicines, foods, dyes, or preservatives -pregnant or trying to get pregnant -breast-feeding How should I use this medicine? Take this medicine by mouth with a glass of water. Follow the directions on the prescription label. You can take it with or without food. If it upsets your stomach, take it with food. Take your medicine at regular intervals. Do not take it more often than directed. Do not stop taking except on your doctor's advice. Talk to your pediatrician regarding the use of this medicine in children. While this drug may be prescribed for children as young as 6 years for selected conditions, precautions do apply. Patients over 29 years of age may have a stronger reaction and need a smaller dose. Overdosage: If you think you have taken too much of this medicine contact a poison control center or emergency room at once. NOTE: This medicine is only for you. Do not share this medicine with others. What if I miss a dose? If you miss a dose, take it as soon as you can. If it is almost time for your next dose, take only that dose. Do not take double or extra doses. What may interact with this medicine? Do not take this medicine with any of the following medications: -tranylcypromine This medicine may also interact with the following medications: -clarithromycin  -cyclosporine -diltiazem -itraconazole -simvastatin -tacrolimus This list may not describe all possible interactions. Give your health care provider a list of all the medicines, herbs, non-prescription drugs, or dietary supplements you use. Also tell them if you smoke, drink alcohol, or use illegal drugs. Some items may interact with your medicine. What should I watch for while using this medicine? Visit your healthcare professional for regular checks on your progress. Check your blood pressure as directed. Ask your healthcare professional what your blood pressure should be and when you should contact him or her. Do not treat yourself for coughs, colds, or pain while you are using this medicine without asking your healthcare professional for advice. Some medicines may increase your blood pressure. You may get dizzy. Do not drive, use machinery, or do anything that needs mental alertness until you know how this medicine affects you. Do not stand or sit up quickly, especially if you are an older patient. This reduces the risk of dizzy or fainting spells. Avoid alcoholic drinks; they can make you dizzier. What side effects may I notice from receiving this medicine? Side effects that you should report to your doctor or health care professional as  soon as possible: -allergic reactions like skin rash, itching or hives; swelling of the face, lips, or tongue -fast, irregular heartbeat -signs and symptoms of low blood pressure like dizziness; feeling faint or lightheaded, falls; unusually weak or tired -swelling of ankles, feet, hands Side effects that usually do not require medical attention (report these to your doctor or health care professional if they continue or are bothersome): -dry mouth -facial flushing -headache -stomach pain -tiredness This list may not describe all possible side effects. Call your doctor for medical advice about side effects. You may report side effects to FDA at  1-800-FDA-1088. Where should I keep my medicine? Keep out of the reach of children. Store at room temperature between 59 and 86 degrees F (15 and 30 degrees C). Throw away any unused medicine after the expiration date. NOTE: This sheet is a summary. It may not cover all possible information. If you have questions about this medicine, talk to your doctor, pharmacist, or health care provider.  2019 Elsevier/Gold Standard (2018-05-19 15:07:10)

## 2019-04-12 NOTE — Progress Notes (Signed)
Patient: Nicholas Nolan Male    DOB: 11/25/1950   68 y.o.   MRN: [id number] Visit Date: 04/12/2019  Today's Provider: Mar Daring, PA-C   Chief Complaint  Patient presents with  . Hypertension   Subjective:    I,Joseline E. Rosas,RMA am acting as a Education administrator for [employer], PA-C.  HPI  Patient here today with c/o elevated blood pressure. Reports that his blood pressure has been running 140's-190's/60's in the past few weeks. Reports that has been having a little bit of dizziness off and on. Diet: unhealthy, not adherent to low salt. Reports no swelling on his legs, no visual disturbances.  Allergies  Allergen Reactions  . Codeine Itching    And hyper also  . Hydromorphone Itching  . Morphine And Related Itching     Current Outpatient Medications:  .  amLODipine (NORVASC) 2.5 MG tablet, Take 1 tablet (2.5 mg total) by mouth daily., Disp: 90 tablet, Rfl: 3 .  Dulaglutide (TRULICITY) 1.5 IR/6.7EL SOPN, Inject 1.5 mg into the skin once a week. , Disp: , Rfl:  .  enalapril (VASOTEC) 20 MG tablet, TAKE 1 TABLET BY MOUTH TWICE A DAY, Disp: 180 tablet, Rfl: 1 .  glimepiride (AMARYL) 4 MG tablet, TAKE 1 TABLET BY MOUTH EVERY DAY, Disp: 90 tablet, Rfl: 1 .  hydrochlorothiazide (HYDRODIURIL) 25 MG tablet, TAKE 1 TABLET BY MOUTH EVERY DAY, Disp: 90 tablet, Rfl: 1 .  metFORMIN (GLUCOPHAGE) 1000 MG tablet, TAKE ONE TABLET BY MOUTH TWICE A DAY WITH A MEAL, Disp: 180 tablet, Rfl: 1 .  simvastatin (ZOCOR) 20 MG tablet, TAKE 1 TABLET BY MOUTH EVERY DAY IN THE EVENING, Disp: , Rfl:  .  atorvastatin (LIPITOR) 10 MG tablet, Take 1 tablet (10 mg total) by mouth daily. (Patient not taking: Reported on 04/12/2019), Disp: 90 tablet, Rfl: 3  Review of Systems  Constitutional: Negative.   Eyes: Negative for visual disturbance.  Respiratory: Negative.   Cardiovascular: Negative.   Gastrointestinal: Negative.   Neurological: Positive for light-headedness. Negative for dizziness and  headaches.  Psychiatric/Behavioral: Negative.     Social History   Tobacco Use  . Smoking status: Former Smoker    Packs/day: 2.00    Years: 45.00    Pack years: 90.00    Types: Cigarettes  . Smokeless tobacco: Current User    Types: Snuff  . Tobacco comment: Quit in 2015  Substance Use Topics  . Alcohol use: No      Objective:   BP (!) 148/64 (BP Location: Left Arm, Patient Position: Sitting, Cuff Size: Large)   Pulse 67   Temp 98 F (36.7 C) (Oral)   Resp 16   Wt 211 lb (95.7 kg)   BMI 31.16 kg/m  Vitals:   04/12/19 1502  BP: (!) 148/64  Pulse: 67  Resp: 16  Temp: 98 F (36.7 C)  TempSrc: Oral  Weight: 211 lb (95.7 kg)     Physical Exam Vitals signs reviewed.  Constitutional:      General: He is not in acute distress.    Appearance: Normal appearance. He is well-developed. He is obese. He is not ill-appearing or diaphoretic.  HENT:     Head: Normocephalic and atraumatic.  Neck:     Musculoskeletal: Normal range of motion and neck supple.     Thyroid: No thyromegaly.     Vascular: No carotid bruit or JVD.     Trachea: No tracheal deviation.  Cardiovascular:  Rate and Rhythm: Normal rate and regular rhythm.     Pulses: Normal pulses.     Heart sounds: Normal heart sounds. No murmur. No friction rub. No gallop.   Pulmonary:     Effort: Pulmonary effort is normal. No respiratory distress.     Breath sounds: Normal breath sounds. No wheezing or rales.  Lymphadenopathy:     Cervical: No cervical adenopathy.  Skin:    Capillary Refill: Capillary refill takes less than 2 seconds.  Neurological:     General: No focal deficit present.     Mental Status: He is alert and oriented to person, place, and time. Mental status is at baseline.         Assessment & Plan    1. Essential hypertension Increase amlodipine from 2.5 mg to 5mg . Continue Enalapril 20mg  BID. Call if not improving.      Mar Daring, PA-C  [facility]

## 2019-04-19 ENCOUNTER — Telehealth: Payer: Self-pay

## 2019-04-19 DIAGNOSIS — I10 Essential (primary) hypertension: Secondary | ICD-10-CM

## 2019-04-19 MED ORDER — VERAPAMIL HCL ER 120 MG PO TBCR: 120.0000 mg | EXTENDED_RELEASE_TABLET | Freq: Every day | ORAL | 1 refills | Status: DC

## 2019-04-19 NOTE — Telephone Encounter (Signed)
Changed to Verapamil

## 2019-04-19 NOTE — Telephone Encounter (Signed)
Please advise 

## 2019-04-19 NOTE — Telephone Encounter (Signed)
Patient states that he is still having elevated blood pressure readings of 198/70 since the medication change. He feels as if he needs to change to a different medication.

## 2019-04-20 NOTE — Telephone Encounter (Signed)
Patient advised.

## 2019-04-24 ENCOUNTER — Telehealth: Payer: Self-pay | Admitting: Physician Assistant

## 2019-04-24 NOTE — Telephone Encounter (Signed)
Patient was advised. Patient states his bp was 210/95 this morning when he checked it.

## 2019-04-24 NOTE — Telephone Encounter (Signed)
Pt was in about 2 weeks ago for elevated bp.  He said he is not feeling any better.  He has been taking the new bp rx but cant tell that it is helping.  He is still having the elevated bp and headaches  CB#  [phone]  Unknown Foley

## 2019-04-24 NOTE — Telephone Encounter (Signed)
Take 2 of the new BP medication (verapamil)

## 2019-04-26 ENCOUNTER — Telehealth: Payer: Self-pay

## 2019-04-26 DIAGNOSIS — I10 Essential (primary) hypertension: Secondary | ICD-10-CM

## 2019-04-26 MED ORDER — METOPROLOL SUCCINATE ER 25 MG PO TB24: 25.0000 mg | ORAL_TABLET | Freq: Every day | ORAL | 3 refills | Status: DC

## 2019-04-26 NOTE — Telephone Encounter (Signed)
Patient called stating that his blood pressure is still elevated even after the last change in medications. This morning his blood pressure was as high as 198/70. He says when his blood pressure is this high he sometimes delvolps mild chest pain/discomfort and dull headache. Patient says he took an extra dose of Verapamil last night and it still didn't help. Patient wants to know if you wants to change any of his medications?

## 2019-04-26 NOTE — Telephone Encounter (Signed)
Continue verapamil 2 tabs at bedtime, continue enalapril 20mg  BID, metoprolol XR 25mg  added. Metoprolol was sent to [facility]. He can take metoprolol at night as well.

## 2019-04-26 NOTE — Telephone Encounter (Signed)
Patient had called earlier at 12 pm and spoke with one of the access nurse and the c/o was chest pain,pressure, heaviness, tightness. Per patient this is usually his normal when his blood pressure gets high  He reported to them a BP of 195/70. Stated he had a headache and the chest pain. They advised the patient to call EMS Now.  According to the call: Patient reported he was heading to the ED, not going to call EMS.   Called patient at 2:00 when I received reports and reports that he had spoken with cma Roshena that he needs another medication.  Patient was advised that if he is having this symptoms that he needs to go to the ED but reports that he usually gets this symptoms with his blood pressure getting high and is normal for him. Still advised patient to go ED., and that I will let provider know.

## 2019-04-27 NOTE — Telephone Encounter (Signed)
LMTCB

## 2019-04-30 NOTE — Telephone Encounter (Signed)
Patient is returning your call. KW

## 2019-04-30 NOTE — Telephone Encounter (Signed)
Patient advised as below.  

## 2019-05-07 ENCOUNTER — Telehealth: Payer: Self-pay

## 2019-05-07 ENCOUNTER — Other Ambulatory Visit: Payer: BC Managed Care – PPO

## 2019-05-07 DIAGNOSIS — Z20822 Contact with and (suspected) exposure to covid-19: Secondary | ICD-10-CM

## 2019-05-07 DIAGNOSIS — I10 Essential (primary) hypertension: Secondary | ICD-10-CM

## 2019-05-07 MED ORDER — CLONIDINE HCL 0.1 MG PO TABS: 0.1000 mg | ORAL_TABLET | Freq: Three times a day (TID) | ORAL | 3 refills | Status: DC

## 2019-05-07 NOTE — Telephone Encounter (Signed)
Patient was advised of message below. Patient stated bp this morning was 177/95. Yesterday his bp was 209/65. Patient stated his systolic has consistently been above 200. Patient has been taking verapamil 120 mg bid.

## 2019-05-07 NOTE — Telephone Encounter (Signed)
I will send for covid testing first for exposure. Once we know if he is covid positive or not we may need to get a renal US for the resistant HTN.   Needs covid testing due to positive exposure. No symptoms. p bu

## 2019-05-07 NOTE — Telephone Encounter (Signed)
Patient reports that blood pressure is still elevated around 200. Patient would like a call back to discuss medications, Verapamil, enalapril and metoprolol.   Patient also wants to be tested for COVID 19 due to positive exposure.

## 2019-05-07 NOTE — Telephone Encounter (Signed)
No answer

## 2019-05-07 NOTE — Telephone Encounter (Signed)
LMTCB

## 2019-05-07 NOTE — Addendum Note (Signed)
Addended by: Denyce Robert on: 05/07/2019 10:52 AM   Modules accepted: Orders

## 2019-05-07 NOTE — Telephone Encounter (Signed)
Is he not taking the other 2 meds also? The enalapril and metoprolol?  If he is taking those also I will send in clonidine for now to lower his BP while we await covid testing.  He needs to have someone else pick up this medication from his pharmacy while he quarantines until covid results are back. If he is not taking the other medications, he needs to take those before starting clonidine.

## 2019-05-07 NOTE — Telephone Encounter (Signed)
Returning your call   Contact number [phone]  Thanks,   -Mickel Baas

## 2019-05-07 NOTE — Addendum Note (Signed)
Addended by: Mar Daring on: 05/07/2019 11:09 AM   Modules accepted: Orders

## 2019-05-07 NOTE — Telephone Encounter (Signed)
Scheduled patient for COVID 19 test today at 3pm at [facility].  Testing protocol reviewed.

## 2019-05-07 NOTE — Telephone Encounter (Signed)
Please advise 

## 2019-05-08 MED ORDER — VERAPAMIL HCL ER 240 MG PO TBCR: 240.0000 mg | EXTENDED_RELEASE_TABLET | Freq: Every day | ORAL | 1 refills | Status: DC

## 2019-05-08 NOTE — Telephone Encounter (Signed)
Patient advised.

## 2019-05-08 NOTE — Telephone Encounter (Signed)
Patient reports that they picked up the Clonidine medicine yesterday, reports that he run out of the Verapamil day before yesterday and that he is taking the Metoprolol,Verapamil and enalapril.  Patient want a refill for the Verapamil reports that he requested this yesterday from the pharmacy but instead of the Verapamil they gave more of the enalapril.

## 2019-05-08 NOTE — Addendum Note (Signed)
Addended by: Mar Daring on: 05/08/2019 11:15 AM   Modules accepted: Orders

## 2019-05-08 NOTE — Telephone Encounter (Signed)
Sent in Verapamil 240mg . Now he only needs to take one of these instead of 2 like he had been doing

## 2019-05-08 NOTE — Addendum Note (Signed)
Addended by: Doristine Devoid on: 05/08/2019 09:32 AM   Modules accepted: Orders

## 2019-05-09 ENCOUNTER — Telehealth: Payer: Self-pay

## 2019-05-09 DIAGNOSIS — I10 Essential (primary) hypertension: Secondary | ICD-10-CM

## 2019-05-09 MED ORDER — VERAPAMIL HCL ER 240 MG PO TBCR: 240.0000 mg | EXTENDED_RELEASE_TABLET | Freq: Every day | ORAL | 1 refills | Status: DC

## 2019-05-09 NOTE — Telephone Encounter (Signed)
Sent in again

## 2019-05-09 NOTE — Telephone Encounter (Signed)
Patient got his Verapamil refilled yesterday.  He said his grandchildren are at the house with him and now he can not find his medicine.  He would like to have another Rx sent in.   [facility]  CB # [phone]

## 2019-05-10 LAB — NOVEL CORONAVIRUS, NAA: SARS-CoV-2, NAA: NOT DETECTED

## 2019-05-12 ENCOUNTER — Other Ambulatory Visit: Payer: Self-pay | Admitting: Physician Assistant

## 2019-05-12 DIAGNOSIS — I10 Essential (primary) hypertension: Secondary | ICD-10-CM

## 2019-05-14 ENCOUNTER — Telehealth: Payer: Self-pay

## 2019-05-14 NOTE — Telephone Encounter (Signed)
Patient was calling regarding the order for the Renal US for the resistant HTN. Reports that he has not heard about the COVID testing results.

## 2019-05-15 NOTE — Telephone Encounter (Signed)
See result message.

## 2019-05-18 ENCOUNTER — Other Ambulatory Visit: Payer: Self-pay | Admitting: Physician Assistant

## 2019-05-18 DIAGNOSIS — I10 Essential (primary) hypertension: Secondary | ICD-10-CM

## 2019-05-25 ENCOUNTER — Telehealth: Payer: Self-pay

## 2019-05-25 DIAGNOSIS — I10 Essential (primary) hypertension: Secondary | ICD-10-CM

## 2019-05-25 NOTE — Telephone Encounter (Signed)
Done

## 2019-05-25 NOTE — Telephone Encounter (Signed)
Patient called stating that Nicholas Nolan suppose to have schedule him for a renal ultrasound. Please advise.

## 2019-05-31 ENCOUNTER — Telehealth: Payer: Self-pay | Admitting: Physician Assistant

## 2019-05-31 ENCOUNTER — Ambulatory Visit: Admission: RE | Admit: 2019-05-31 | Payer: Medicare Other | Source: Ambulatory Visit

## 2019-05-31 DIAGNOSIS — I10 Essential (primary) hypertension: Secondary | ICD-10-CM

## 2019-05-31 NOTE — Telephone Encounter (Signed)
That is correct. That is what I thought I had ordered. I apologize. New order placed.

## 2019-05-31 NOTE — Telephone Encounter (Signed)
Anderson Malta with [facility] [place] [facility] called is scheduled for a renal US for Hypertension  This should be a Renal Artery [place] if the diagnosis is HTN.  Please Clarify.    His appt is 2:00pm   [phone]  Teri+

## 2019-06-01 ENCOUNTER — Other Ambulatory Visit: Payer: Self-pay

## 2019-06-01 ENCOUNTER — Encounter (INDEPENDENT_AMBULATORY_CARE_PROVIDER_SITE_OTHER): Payer: Self-pay

## 2019-06-01 ENCOUNTER — Ambulatory Visit
Admission: RE | Admit: 2019-06-01 | Discharge: 2019-06-01 | Disposition: A | Discharge status: Home / Self Care | Payer: Medicare Other | Source: Ambulatory Visit | Attending: Physician Assistant | Admitting: Physician Assistant

## 2019-06-01 ENCOUNTER — Other Ambulatory Visit: Payer: Self-pay | Admitting: Physician Assistant

## 2019-06-01 DIAGNOSIS — R93421 Abnormal radiologic findings on diagnostic imaging of right kidney: Secondary | ICD-10-CM | POA: Insufficient documentation

## 2019-06-01 DIAGNOSIS — I10 Essential (primary) hypertension: Secondary | ICD-10-CM | POA: Insufficient documentation

## 2019-06-01 DIAGNOSIS — I701 Atherosclerosis of renal artery: Secondary | ICD-10-CM | POA: Insufficient documentation

## 2019-06-01 DIAGNOSIS — Z87891 Personal history of nicotine dependence: Secondary | ICD-10-CM | POA: Diagnosis not present

## 2019-06-01 DIAGNOSIS — Z79899 Other long term (current) drug therapy: Secondary | ICD-10-CM | POA: Insufficient documentation

## 2019-06-01 NOTE — Progress Notes (Signed)
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

## 2019-06-04 ENCOUNTER — Telehealth: Payer: Self-pay

## 2019-06-04 NOTE — Telephone Encounter (Signed)
LVMTRC 

## 2019-06-04 NOTE — Telephone Encounter (Signed)
-----   Message from Mar Daring, [place] sent at 06/01/2019 12:39 PM EDT ----- Artery that feeds the right kidney appears to have a blockage that may require intervention. I will refer you to vascular surgery for consideration of treatment. Once this is fixed your BP will probably decrease and we can stop some of the medications over time.

## 2019-06-05 NOTE — Telephone Encounter (Signed)
Patient advised as directed below. 

## 2019-06-05 NOTE — Telephone Encounter (Signed)
Pt returned call. Please advise. Thanks TNP °

## 2019-06-19 ENCOUNTER — Ambulatory Visit (INDEPENDENT_AMBULATORY_CARE_PROVIDER_SITE_OTHER): Payer: Medicare Other | Admitting: Vascular Surgery

## 2019-06-19 ENCOUNTER — Telehealth (INDEPENDENT_AMBULATORY_CARE_PROVIDER_SITE_OTHER): Payer: Self-pay

## 2019-06-19 ENCOUNTER — Encounter (INDEPENDENT_AMBULATORY_CARE_PROVIDER_SITE_OTHER): Payer: Self-pay

## 2019-06-19 ENCOUNTER — Encounter (INDEPENDENT_AMBULATORY_CARE_PROVIDER_SITE_OTHER): Payer: Self-pay | Admitting: Vascular Surgery

## 2019-06-19 ENCOUNTER — Other Ambulatory Visit: Payer: Self-pay

## 2019-06-19 VITALS — BP 128/58 | HR 54 | Resp 16 | Ht 70.0 in | Wt 212.6 lb

## 2019-06-19 DIAGNOSIS — E785 Hyperlipidemia, unspecified: Secondary | ICD-10-CM | POA: Diagnosis not present

## 2019-06-19 DIAGNOSIS — I701 Atherosclerosis of renal artery: Secondary | ICD-10-CM | POA: Diagnosis not present

## 2019-06-19 DIAGNOSIS — I10 Essential (primary) hypertension: Secondary | ICD-10-CM

## 2019-06-19 DIAGNOSIS — E119 Type 2 diabetes mellitus without complications: Secondary | ICD-10-CM | POA: Diagnosis not present

## 2019-06-19 NOTE — Assessment & Plan Note (Signed)
His primary care physician astutely ordered a renal artery duplex which suggested hemodynamically significant right renal artery stenosis He is on 5 medications to get his blood pressure under control which would clearly be an indication for intervention of his right renal artery stenosis.  I discussed the duplex is not perfect and if we did not find significant stenosis at the time of the procedure, no stent would be placed.  I also discussed that if significant left renal artery stenosis was found as well, I generally treat these one at a time to avoid unsafe blood pressure drops.  He and his wife voiced their understanding and all their questions were answered.  He will be scheduled for renal angiogram with possible intervention in the near future at his convenience

## 2019-06-19 NOTE — Telephone Encounter (Signed)
Patient was seen today and scheduled for a procedure with Dr. Lucky Cowboy on 06/25/2019 with a 10:30 am arrival time to the [facility]. Patient will do Covid testing on 06/21/2019 between 12:30-2:30 pm at the [facility]. Pre-procedure instructions were discussed and given to the patient.

## 2019-06-19 NOTE — Assessment & Plan Note (Signed)
Given the clinical scenario renal artery stenosis is likely a major driving factor keeping his blood pressure up.  Therefore, intervention is certainly recommended.

## 2019-06-19 NOTE — Assessment & Plan Note (Signed)
blood glucose control important in reducing the progression of atherosclerotic disease. Also, involved in wound healing. On appropriate medications.  

## 2019-06-19 NOTE — Assessment & Plan Note (Signed)
lipid control important in reducing the progression of atherosclerotic disease. Continue statin therapy  

## 2019-06-19 NOTE — Progress Notes (Signed)
Patient ID: Nicholas Nolan, male   DOB: 04-09-1951, 68 y.o.   MRN: [id number]  Chief Complaint  Patient presents with  . New Patient (Initial Visit)    ref [facility] for renal artery stenosis    HPI Nicholas Nolan is a 68 y.o. male.  I am asked to see the patient by Joette Catching, PA-C for evaluation of renal artery stenosis.  Patient has longstanding high blood pressure which has become more difficult to control over time despite increasing medical regimen.  His kidney function is okay as far as he knows.  His blood pressure today is 128/58, but he says that is much better than it normally is.  His primary care physician astutely ordered a renal artery duplex which suggested hemodynamically significant right renal artery stenosis so he is referred for further evaluation and treatment.  He is in his usual state of health today and does not have specific complaints   Past Medical History:  Diagnosis Date  . Back pain    leg pain  . Diabetes mellitus    type 2  . Hyperlipidemia   . Hypertension   . Neuropathy    feet    Past Surgical History:  Procedure Laterality Date  . BASAL CELL CARCINOMA EXCISION    . LUMBAR LAMINECTOMY/DECOMPRESSION MICRODISCECTOMY  01/10/2012   Procedure: LUMBAR LAMINECTOMY/DECOMPRESSION MICRODISCECTOMY 2 LEVELS;  Surgeon: Hosie Spangle, MD;  Location: [facility];  Service: Neurosurgery;  Laterality: Bilateral;  Lumbar four-Sacral One laminectomies  . LUNG SURGERY Right 11/22/12   right upper lobe  . PROSTATE SURGERY     biopsy-due to elelvated PSA  . SHOULDER SURGERY    . VASECTOMY      Family History Family History  Problem Relation Age of Onset  . Cancer Father        prostate  . Heart disease Father        MI  . Stroke Father   . Cancer Brother        liver  no bleeding or clotting disorders  Social History Social History   Tobacco Use  . Smoking status: Former Smoker    Packs/day: 2.00    Years: 45.00    Pack years: 90.00   Types: Cigarettes  . Smokeless tobacco: Current User    Types: Snuff  . Tobacco comment: Quit in 2015  Substance Use Topics  . Alcohol use: No  . Drug use: No     Allergies  Allergen Reactions  . Codeine Itching    And hyper also  . Hydromorphone Itching  . Morphine And Related Itching    Current Outpatient Medications  Medication Sig Dispense Refill  . cloNIDine (CATAPRES) 0.1 MG tablet Take 1 tablet (0.1 mg total) by mouth 3 (three) times daily. 90 tablet 3  . Dulaglutide (TRULICITY) 1.5 OM/7.6HM SOPN Inject 1.5 mg into the skin once a week.     . enalapril (VASOTEC) 20 MG tablet TAKE 1 TABLET BY MOUTH TWICE A DAY 180 tablet 1  . glimepiride (AMARYL) 4 MG tablet TAKE 1 TABLET BY MOUTH EVERY DAY 90 tablet 1  . hydrochlorothiazide (HYDRODIURIL) 25 MG tablet TAKE 1 TABLET BY MOUTH EVERY DAY 90 tablet 1  . metFORMIN (GLUCOPHAGE) 1000 MG tablet TAKE ONE TABLET BY MOUTH TWICE A DAY WITH A MEAL 180 tablet 1  . metoprolol succinate (TOPROL-XL) 25 MG 24 hr tablet Take 1 tablet (25 mg total) by mouth daily. 90 tablet 3  . simvastatin (ZOCOR)  20 MG tablet TAKE 1 TABLET BY MOUTH EVERY DAY IN THE EVENING    . verapamil (CALAN-SR) 240 MG CR tablet Take 1 tablet (240 mg total) by mouth at bedtime. 90 tablet 1  . atorvastatin (LIPITOR) 10 MG tablet Take 1 tablet (10 mg total) by mouth daily. (Patient not taking: Reported on 04/12/2019) 90 tablet 3   No current facility-administered medications for this visit.       REVIEW OF SYSTEMS (Negative unless checked)  Constitutional: [] Weight loss  [] Fever  [] Chills Cardiac: [] Chest pain   [] Chest pressure   [] Palpitations   [] Shortness of breath when laying flat   [] Shortness of breath at rest   [] Shortness of breath with exertion. Vascular:  [x] Pain in legs with walking   [] Pain in legs at rest   [] Pain in legs when laying flat   [] Claudication   [] Pain in feet when walking  [] Pain in feet at rest  [] Pain in feet when laying flat   [] History of DVT    [] Phlebitis   [] Swelling in legs   [] Varicose veins   [] Non-healing ulcers Pulmonary:   [] Uses home oxygen   [] Productive cough   [] Hemoptysis   [] Wheeze  [] COPD   [] Asthma Neurologic:  [] Dizziness  [] Blackouts   [] Seizures   [] History of stroke   [] History of TIA  [] Aphasia   [] Temporary blindness   [] Dysphagia   [] Weakness or numbness in arms   [] Weakness or numbness in legs Musculoskeletal:  [x] Arthritis   [] Joint swelling   [x] Joint pain   [x] Low back pain Hematologic:  [] Easy bruising  [] Easy bleeding   [] Hypercoagulable state   [] Anemic  [] Hepatitis Gastrointestinal:  [] Blood in stool   [] Vomiting blood  [] Gastroesophageal reflux/heartburn   [] Abdominal pain Genitourinary:  [] Chronic kidney disease   [] Difficult urination  [] Frequent urination  [] Burning with urination   [] Hematuria Skin:  [] Rashes   [] Ulcers   [] Wounds Psychological:  [] History of anxiety   []  History of major depression.    Physical Exam BP (!) 128/58 (BP Location: Right Arm)   Pulse (!) 54   Resp 16   Ht 5\' 10"  (1.778 m)   Wt 212 lb 9.6 oz (96.4 kg)   BMI 30.50 kg/m  Gen:  WD/WN, NAD Head: [place]/AT, No temporalis wasting. Ear/Nose/Throat: Hearing grossly intact, nares w/o erythema or drainage, oropharynx w/o Erythema/Exudate Eyes: Conjunctiva clear, sclera non-icteric  Neck: trachea midline.  No JVD.  Pulmonary:  Good air movement, respirations not labored, no use of accessory muscles  Cardiac: RRR, no JVD Vascular:  Vessel Right Left  Radial Palpable Palpable                                   Gastrointestinal:. No masses, surgical incisions, or scars. Musculoskeletal: M/S 5/5 throughout.  Extremities without ischemic changes.  No deformity or atrophy.  No edema. Neurologic: Sensation grossly intact in extremities.  Symmetrical.  Speech is fluent. Motor exam as listed above. Psychiatric: Judgment intact, Mood & affect appropriate for pt's clinical situation. Dermatologic: No rashes or ulcers noted.   No cellulitis or open wounds.    Radiology US Renal Artery Duplex Complete  Result Date: 06/01/2019 CLINICAL DATA:  68 year old male with resistant hypertension. Evaluate for renal artery stenosis. EXAM: RENAL/URINARY TRACT ULTRASOUND RENAL DUPLEX DOPPLER ULTRASOUND COMPARISON:  None. FINDINGS: Right Kidney: Length: 11.9 cm. Echogenicity within normal limits. No mass or hydronephrosis visualized. Left Kidney: Length: 11.8 cm. Echogenicity  within normal limits. No mass or hydronephrosis visualized. Bladder:  Within normal limits RENAL DUPLEX ULTRASOUND Right Renal Artery Velocities: Origin:  139 cm/sec Mid:  228 cm/sec Hilum:  129 cm/sec Interlobar:  66 cm/sec Arcuate:  38 cm/sec Left Renal Artery Velocities: Origin:  163 cm/sec Mid:  123 cm/sec Hilum:  152 cm/sec Interlobar:  77 cm/sec Arcuate:  53 cm/sec Aortic Velocity:  90 cm/sec Right Renal-Aortic Ratios: Origin: 1.5 Mid:  2.5 Hilum: 1.4 Interlobar: 0.7 Arcuate: 0.4 Left Renal-Aortic Ratios: Origin: 1.8 Mid: 1.4 Hilum: 1.7 Interlobar: 0.9 Arcuate: 0.6 Renal veins: The bilateral renal veins are patent at the hila. IMPRESSION: 1. Elevated peak systolic velocity in the mid segment of the right renal artery concerning for a hemodynamically significant renal artery stenosis. Consider referral to Interventional Radiology or other endovascular specialist for further evaluation. 2. No evidence of significant left renal artery stenosis. 3. Sonographically unremarkable appearance of the kidneys. Signed, Criselda Peaches, MD, [facility]iology [phone] Electronically Signed   By: Jacqulynn Cadet M.D.   On: 06/01/2019 12:25    Labs Recent Results (from the past 2160 hour(s))  Novel Coronavirus, NAA ([facility])     Status: None   Collection Time: 05/07/19 11:05 AM  Result Value Ref Range   SARS-CoV-2, NAA Not Detected Not Detected    Comment: Testing was performed using the cobas(R) SARS-CoV-2  test. This test was developed and its performance characteristics determined by [employer]. This test has not been FDA cleared or approved. This test has been authorized by FDA under an Emergency Use Authorization (EUA). This test is only authorized for the duration of time the declaration that circumstances exist justifying the authorization of the emergency use of in vitro diagnostic tests for detection of SARS-CoV-2 virus and/or diagnosis of COVID-19 infection under section 564(b)(1) of the Act, 21 U.S.C. 211HER-7(E)(0), unless the authorization is terminated or revoked sooner. When diagnostic testing is negative, the possibility of a false negative result should be considered in the context of a patient's recent exposures and the presence of clinical signs and symptoms consistent with COVID-19. An individual without symptoms of COVID-19 and who is not shedding SARS-CoV-2 virus would expect to have a negati ve (not detected) result in this assay.     Assessment/Plan:  Resistant hypertension Given the clinical scenario renal artery stenosis is likely a major driving factor keeping his blood pressure up.  Therefore, intervention is certainly recommended.  Controlled type 2 diabetes mellitus without complication, without long-term current use of insulin (HCC) blood glucose control important in reducing the progression of atherosclerotic disease. Also, involved in wound healing. On appropriate medications.   Hyperlipemia lipid control important in reducing the progression of atherosclerotic disease. Continue statin therapy   Right renal artery stenosis [facility]) His primary care physician astutely ordered a renal artery duplex which suggested hemodynamically significant right renal artery stenosis He is on 5 medications to get his blood pressure under control which would clearly be an indication for intervention of his right renal artery stenosis.  I discussed the duplex is  not perfect and if we did not find significant stenosis at the time of the procedure, no stent would be placed.  I also discussed that if significant left renal artery stenosis was found as well, I generally treat these one at a time to avoid unsafe blood pressure drops.  He and his wife voiced their understanding and all their questions were answered.  He will be scheduled for renal  angiogram with possible intervention in the near future at his [place] [place] 06/19/2019, 2:51 PM   This note was created with Dragon medical transcription system.  Any errors from dictation are unintentional.

## 2019-06-19 NOTE — Patient Instructions (Signed)
Renal Artery Stenosis Renal artery stenosis (RAS) is a narrowing of the artery that carries blood to the kidneys. It can affect one or both kidneys. The kidneys filter waste and extra fluid from the blood. Waste and fluid are then removed when a person passes urine. The kidneys also make an important chemical messenger (hormone) called renin. Renin helps regulate blood pressure. The first sign of RAS may be high blood pressure. Other symptoms can develop over time. What are the causes? A common cause of this condition is plaque buildup in your arteries (atherosclerosis). The plaques that cause this are made up of:  Fat.  Cholesterol.  Calcium.  Other substances. As these substances build up in your renal artery, the blood supply to your kidneys slows. The lack of blood and oxygen causes the signs and symptoms of RAS. A much less common cause of RAS is a disease called fibromuscular dysplasia. This disease causes abnormal cell growth that narrows the renal artery. It is not related to atherosclerosis. It occurs mostly in women who are 1-93 years old. It may be passed down through families.  What increases the risk? You are more likely to develop this condition if you:  Are a man who is at least 68 years old.  Are a woman who is at least 68 years old.  Have high blood pressure.  Have high cholesterol.  Are a smoker.  Abuse alcohol.  Have diabetes or prediabetes.  Are overweight or obese.  Have a family history of early heart disease. What are the signs or symptoms? RAS usually develops slowly. You may not have any signs or symptoms at first. Early signs may include:  Development of high blood pressure.  A sudden increase in existing high blood pressure.  No longer responding to medicine that used to control your blood pressure. Later signs and symptoms are due to kidney damage. They may include:  Feeling tired (fatigue).  Shortness of breath.  Swollen legs and feet.   Dry skin.  Headaches.  Muscle cramps.  Loss of appetite.  Nausea or vomiting. How is this diagnosed? This condition may be diagnosed based on:  Your symptoms and medical history. Your health care provider may suspect RAS based on changes in your blood pressure and your risk factors.  A physical exam. During the exam, your health care provider will use a stethoscope to listen for a whooshing sound (bruit) that can occur where the renal artery is blocking blood flow.  Various tests. These may include: ? Blood and urine tests to check your kidney function. ? Imaging tests of your kidneys, such as:  A test that uses sound waves to create an image of your kidneys and the blood flow to your kidneys (ultrasound).  A test in which dye is injected into one of your blood vessels so images can be taken as the dye flows through your renal arteries (angiogram). This can be done using X-rays, a CT scan (computed tomography angiogram, CTA), or a type of MRI (magnetic resonance angiogram, MRA). How is this treated? Making lifestyle changes to reduce your risk factors is the first treatment option for early RAS. If the blood flow to one of your kidneys is cut by more than half, you may need medicine to:  Lower your blood pressure. This is the main medical treatment for RAS. You may need more than one type of medicine for this. The types that work best for people with RAS are: ? ACE inhibitors. ? Angiotensin receptor  blockers.  Reduce fluid in the body (diuretics).  Lower your cholesterol (statins). If medicine is not enough to control RAS, you may need surgery. This may involve:  Threading a tube with an inflatable balloon into the renal artery to force it to open (angioplasty).  Removing plaque from inside the artery (endarterectomy). Follow these instructions at home:  Lifestyle  Make any lifestyle changes recommended by your health care provider. This may include: ? Working with a  dietitian to maintain a heart-healthy diet. This type of diet is low in saturated fat, salt, and added sugar. ? Starting an exercise program as directed by your health care provider. ? Maintaining a healthy weight. ? Quitting smoking. ? Not abusing alcohol. General instructions  Take over-the-counter and prescription medicines only as told by your health care provider.  Keep all follow-up visits as told by your health care provider. This is important. Contact a health care provider if:  Your symptoms of RAS are not getting better.  Your symptoms are changing or getting worse. Get help right away if you have:  Very bad pain in your back or abdomen.  Blood in your urine. Summary  Renal artery stenosis (RAS) is a narrowing of the artery that carries blood to the kidneys. It can affect one or both kidneys.  RAS usually develops slowly. You may not have any signs or symptoms at first, but high blood pressure that is difficult to control is a key symptom.  Making lifestyle changes to reduce your risk factors is the first treatment option for early RAS. If the blood flow to one of your kidneys is cut by more than half, you may need medicines to help manage your cholesterol and blood pressure. This information is not intended to replace advice given to you by your health care provider. Make sure you discuss any questions you have with your health care provider. Document Released: 07/21/2005 Document Revised: 11/21/2017 Document Reviewed: 11/21/2017 Elsevier Patient Education  2020 [employer].

## 2019-06-20 ENCOUNTER — Encounter (INDEPENDENT_AMBULATORY_CARE_PROVIDER_SITE_OTHER): Payer: Self-pay

## 2019-06-20 NOTE — Telephone Encounter (Signed)
Patient called back on 06/19/2019 to reschedule his procedure with Dr. Lucky Cowboy. Patient has been rescheduled to 07/02/2019 with a 9:45 am arrival time to the MM. Patient will do his Covid testing on 05/28/2019 between 12:30-2:30 pm. I have attempted to contact the patient to give him his rescheduled time. A message was left for return call. This information will be mailed to the patient.

## 2019-06-20 NOTE — Telephone Encounter (Signed)
Patient called back and all information was given. The pre-procedure instructions will be mailed to the patient.

## 2019-06-21 ENCOUNTER — Other Ambulatory Visit: Payer: Medicare Other

## 2019-06-28 ENCOUNTER — Other Ambulatory Visit: Payer: Self-pay

## 2019-06-28 ENCOUNTER — Other Ambulatory Visit
Admission: RE | Admit: 2019-06-28 | Discharge: 2019-06-28 | Disposition: A | Discharge status: Home / Self Care | Payer: Medicare Other | Source: Ambulatory Visit | Attending: Vascular Surgery | Admitting: Vascular Surgery

## 2019-06-28 DIAGNOSIS — Z20828 Contact with and (suspected) exposure to other viral communicable diseases: Secondary | ICD-10-CM | POA: Insufficient documentation

## 2019-06-28 DIAGNOSIS — Z01812 Encounter for preprocedural laboratory examination: Secondary | ICD-10-CM | POA: Insufficient documentation

## 2019-06-28 LAB — SARS CORONAVIRUS 2 (TAT 6-24 HRS): SARS Coronavirus 2: NEGATIVE

## 2019-06-30 ENCOUNTER — Other Ambulatory Visit: Payer: Self-pay | Admitting: Physician Assistant

## 2019-06-30 DIAGNOSIS — I10 Essential (primary) hypertension: Secondary | ICD-10-CM

## 2019-07-01 ENCOUNTER — Other Ambulatory Visit (INDEPENDENT_AMBULATORY_CARE_PROVIDER_SITE_OTHER): Payer: Self-pay | Admitting: Nurse Practitioner

## 2019-07-02 ENCOUNTER — Encounter
Admission: RE | Disposition: A | Discharge status: Home / Self Care | Payer: Self-pay | Source: Home / Self Care | Attending: Vascular Surgery

## 2019-07-02 ENCOUNTER — Other Ambulatory Visit: Payer: Self-pay

## 2019-07-02 ENCOUNTER — Ambulatory Visit
Admission: RE | Admit: 2019-07-02 | Discharge: 2019-07-02 | Disposition: A | Discharge status: Home / Self Care | Payer: Medicare Other | Attending: Vascular Surgery | Admitting: Vascular Surgery

## 2019-07-02 DIAGNOSIS — Z823 Family history of stroke: Secondary | ICD-10-CM | POA: Insufficient documentation

## 2019-07-02 DIAGNOSIS — Z7984 Long term (current) use of oral hypoglycemic drugs: Secondary | ICD-10-CM | POA: Insufficient documentation

## 2019-07-02 DIAGNOSIS — Z79899 Other long term (current) drug therapy: Secondary | ICD-10-CM | POA: Diagnosis not present

## 2019-07-02 DIAGNOSIS — I150 Renovascular hypertension: Secondary | ICD-10-CM | POA: Diagnosis not present

## 2019-07-02 DIAGNOSIS — I701 Atherosclerosis of renal artery: Secondary | ICD-10-CM

## 2019-07-02 DIAGNOSIS — Z87891 Personal history of nicotine dependence: Secondary | ICD-10-CM | POA: Diagnosis not present

## 2019-07-02 DIAGNOSIS — Z885 Allergy status to narcotic agent status: Secondary | ICD-10-CM | POA: Insufficient documentation

## 2019-07-02 DIAGNOSIS — E785 Hyperlipidemia, unspecified: Secondary | ICD-10-CM | POA: Insufficient documentation

## 2019-07-02 DIAGNOSIS — Z9852 Vasectomy status: Secondary | ICD-10-CM | POA: Diagnosis not present

## 2019-07-02 DIAGNOSIS — Z8249 Family history of ischemic heart disease and other diseases of the circulatory system: Secondary | ICD-10-CM | POA: Diagnosis not present

## 2019-07-02 DIAGNOSIS — E1140 Type 2 diabetes mellitus with diabetic neuropathy, unspecified: Secondary | ICD-10-CM | POA: Insufficient documentation

## 2019-07-02 HISTORY — PX: RENAL ANGIOGRAPHY: CATH118260

## 2019-07-02 LAB — BUN: BUN: 47 mg/dL — ABNORMAL HIGH (ref 8–23)

## 2019-07-02 LAB — CREATININE, SERUM
Creatinine, Ser: 1.75 mg/dL — ABNORMAL HIGH (ref 0.61–1.24)
GFR calc Af Amer: 45 mL/min — ABNORMAL LOW (ref >60)
GFR calc non Af Amer: 39 mL/min — ABNORMAL LOW (ref >60)

## 2019-07-02 LAB — GLUCOSE, CAPILLARY: Glucose-Capillary: 154 mg/dL — ABNORMAL HIGH (ref 70–99)

## 2019-07-02 SURGERY — RENAL ANGIOGRAPHY
Anesthesia: Moderate Sedation | Laterality: Right

## 2019-07-02 MED ORDER — FAMOTIDINE 20 MG PO TABS: 40.0000 mg | ORAL_TABLET | Freq: Once | ORAL | Status: DC | PRN

## 2019-07-02 MED ORDER — ONDANSETRON HCL 4 MG/2ML IJ SOLN: 4.0000 mg | Freq: Four times a day (QID) | INTRAMUSCULAR | Status: DC | PRN

## 2019-07-02 MED ORDER — HEPARIN SODIUM (PORCINE) 1000 UNIT/ML IJ SOLN
INTRAMUSCULAR | Status: DC | PRN
  Administered 2019-07-02: 3000 [IU] via INTRAVENOUS

## 2019-07-02 MED ORDER — HEPARIN SODIUM (PORCINE) 1000 UNIT/ML IJ SOLN
INTRAMUSCULAR | Status: AC
  Filled 2019-07-02: qty 1

## 2019-07-02 MED ORDER — DIPHENHYDRAMINE HCL 50 MG/ML IJ SOLN: 50.0000 mg | Freq: Once | INTRAMUSCULAR | Status: DC | PRN

## 2019-07-02 MED ORDER — FENTANYL CITRATE (PF) 100 MCG/2ML IJ SOLN: 12.5000 ug | Freq: Once | INTRAMUSCULAR | Status: DC | PRN

## 2019-07-02 MED ORDER — MIDAZOLAM HCL 5 MG/5ML IJ SOLN
INTRAMUSCULAR | Status: AC
  Filled 2019-07-02: qty 10

## 2019-07-02 MED ORDER — MIDAZOLAM HCL 2 MG/ML PO SYRP: 8.0000 mg | ORAL_SOLUTION | Freq: Once | ORAL | Status: DC | PRN

## 2019-07-02 MED ORDER — FENTANYL CITRATE (PF) 100 MCG/2ML IJ SOLN
INTRAMUSCULAR | Status: DC | PRN
  Administered 2019-07-02: 50 ug via INTRAVENOUS

## 2019-07-02 MED ORDER — SODIUM CHLORIDE 0.9 % IV SOLN
INTRAVENOUS | Status: DC
  Administered 2019-07-02: 1000 mL via INTRAVENOUS

## 2019-07-02 MED ORDER — FENTANYL CITRATE (PF) 100 MCG/2ML IJ SOLN
INTRAMUSCULAR | Status: AC
  Filled 2019-07-02: qty 4

## 2019-07-02 MED ORDER — MIDAZOLAM HCL 2 MG/2ML IJ SOLN
INTRAMUSCULAR | Status: DC | PRN
  Administered 2019-07-02: 2 mg via INTRAVENOUS

## 2019-07-02 MED ORDER — METHYLPREDNISOLONE SODIUM SUCC 125 MG IJ SOLR: 125.0000 mg | Freq: Once | INTRAMUSCULAR | Status: DC | PRN

## 2019-07-02 MED ORDER — CEFAZOLIN SODIUM-DEXTROSE 2-4 GM/100ML-% IV SOLN
2.0000 g | Freq: Once | INTRAVENOUS | Status: AC
  Administered 2019-07-02: 2 g via INTRAVENOUS

## 2019-07-02 MED ORDER — CEFAZOLIN SODIUM-DEXTROSE 2-4 GM/100ML-% IV SOLN
INTRAVENOUS | Status: AC
  Administered 2019-07-02: 2 g via INTRAVENOUS
  Filled 2019-07-02: qty 100

## 2019-07-02 SURGICAL SUPPLY — 10 items
CATH BEACON 5 .035 65 C2 TIP (CATHETERS) ×2 IMPLANT
CATH PIG 70CM (CATHETERS) ×2 IMPLANT
COVER PROBE U/S 5X48 (MISCELLANEOUS) ×2 IMPLANT
DEVICE STARCLOSE SE CLOSURE (Vascular Products) ×2 IMPLANT
GLIDEWIRE STIFF .35X180X3 HYDR (WIRE) ×2 IMPLANT
PACK ANGIOGRAPHY (CUSTOM PROCEDURE TRAY) ×2 IMPLANT
SHEATH BRITE TIP 5FRX11 (SHEATH) ×2 IMPLANT
SYR MEDRAD MARK 7 150ML (SYRINGE) ×2 IMPLANT
TUBING CONTRAST HIGH PRESS 72 (TUBING) ×2 IMPLANT
WIRE J 3MM .035X145CM (WIRE) ×2 IMPLANT

## 2019-07-02 NOTE — Op Note (Signed)
[facility] Percutaneous Study/Intervention Procedural Note    Surgeon(s): [employer]  Assistants: None  Pre-operative Diagnosis: [place] suggesting right renal artery stenosis, severe hypertension  Post-operative diagnosis: Same but only mild renal artery stenosis identified on angiogram  Procedure(s) Performed: 1. Ultrasound guidance for vascular access right femoral artery 2. Catheter placement into bilateral renal arteries from right femoral approach 3. Aortogram and selective bilateral renal angiogram 4. StarClose closure device right femoral artery  Contrast: 25 cc  EBL: 5 cc   Fluoro Time: 1.0 minutes  Moderate conscious sedation: Approximately 15 minutes with 2 mg of Versed and 50 mcg of Fentanyl  Indications: The patient is a 68 year old male with worsening severe hypertension despite multiple. The patient has suboptimal blood pressure control despite multiple antihypertensives and a noninvasive study suggesting hemodynamically significant right renal artery stenosis. Given the clinical scenario and the noninvasive findings, angiogram is indicated for further evaluation of her renal artery and potential treatment. Risks and benefits are discussed and informed consent is obtained.  Procedure: The patient was identified and appropriate procedural time out was performed. The patient was then placed supine on the table and prepped and draped in the usual sterile fashion.Moderate conscious sedation was administered with a face to face encounter with the patient throughout the procedure with my supervision of the RN administering medicines and monitoring the patients vital signs and mental status throughout from the start of the procedure until the patient was taken to the recovery room  Ultrasound was used to evaluate the right common femoral artery. It was patent . A digital ultrasound image  was acquired. A Seldinger needle was used to access the right common femoral artery under direct ultrasound guidance and a permanent image was performed. A 0.035 J wire was advanced without resistance and a 5Fr sheath was placed. Pigtail catheter was placed into the aorta at the L1 level and an AP aortogram was performed. This demonstrated what appeared to be a reasonably normal aorta and iliac arteries with mild stenosis.  Both renal arteries had brisk flow and the do not appear to be any obvious disease on the initial aortogram.  I used a C2 catheter to cannulate the left renal artery and selective imaging was performed. This demonstrated a nearly normal left renal artery with only about a 10 to 15% area of narrowing from mild calcification.  I then remove the C2 catheter from the left renal artery and selective right renal artery and selective imaging was performed including multiple angles.  This demonstrated a nearly normal right renal artery with only about 10% stenosis from mild calcification.  These lesions were obviously not flow-limiting and no intervention would be of benefit.  The C2 catheter was removed.. Oblique arteriogram was performed of the right femoral artery and StarClose closure device was deployed in the usual fashion with excellent hemostatic result. The patient was taken to the recovery room in stable condition having tolerated the procedure well.  Findings:  Aortogram/Renal Arteries:Relatively normal aorta and iliac arteries without significant stenosis.  Neither renal artery had more than about a 15% stenosis on selective imaging.   Condition:  Stable  Complications: None   Leotis Pain 07/02/2019 12:02 PM  This note was created with Dragon Medical transcription system. Any errors in dictation are purely unintentional.

## 2019-07-02 NOTE — Discharge Instructions (Signed)

## 2019-07-02 NOTE — H&P (Signed)
 [facility] History & Physical Update  The patient was interviewed and re-examined.  The patient's previous History and Physical has been reviewed and is unchanged.  There is no change in the plan of care. We plan to proceed with the scheduled procedure.  Leotis Pain, MD  07/02/2019, 9:40 AM

## 2019-07-03 ENCOUNTER — Telehealth: Payer: Self-pay

## 2019-07-03 NOTE — Telephone Encounter (Signed)
Recommend f/u with PCP today or tomorrow.  Could adjust some medicaitons.  If develops any vision changes or chest pain, recommend ER.

## 2019-07-03 NOTE — Telephone Encounter (Signed)
Patient advised. Appointment scheduled with PCP on 07/04/2019.

## 2019-07-03 NOTE — Telephone Encounter (Signed)
Patient called and stated that he went to have a procedure done for a blockage in his artery that leads to his right kidney but the doctor did not find any blockage. Patient wants to know what he needs to do now because his BP is still high and because they did not find a blockage he is a little concerned. Patient is aware that Tawanna Sat is out of the office today and message will be send to another provider. Please advise.

## 2019-07-04 ENCOUNTER — Ambulatory Visit (INDEPENDENT_AMBULATORY_CARE_PROVIDER_SITE_OTHER): Payer: Medicare Other | Admitting: Physician Assistant

## 2019-07-04 ENCOUNTER — Encounter: Payer: Self-pay | Admitting: Physician Assistant

## 2019-07-04 ENCOUNTER — Other Ambulatory Visit: Payer: Self-pay

## 2019-07-04 VITALS — BP 146/78 | HR 45 | Temp 97.8°F | Resp 16 | Wt 213.0 lb

## 2019-07-04 DIAGNOSIS — R0609 Other forms of dyspnea: Secondary | ICD-10-CM

## 2019-07-04 DIAGNOSIS — I10 Essential (primary) hypertension: Secondary | ICD-10-CM | POA: Diagnosis not present

## 2019-07-04 LAB — EKG 12-LEAD

## 2019-07-04 MED ORDER — OLMESARTAN MEDOXOMIL 40 MG PO TABS: 40.0000 mg | ORAL_TABLET | Freq: Every day | ORAL | 1 refills | Status: DC

## 2019-07-04 NOTE — Progress Notes (Signed)
Patient: Nicholas Nolan Male    DOB: 1951-09-17   68 y.o.   MRN: [id number] Visit Date: 07/04/2019  Today's Provider: Mar Daring, PA-C   Chief Complaint  Patient presents with  . Hypertension   Subjective:     HPI  Elevated blood pressure for the last week. It was 180/75 yesterday. Patient is experiencing chest pressure when blood pressure is elevated. He has had resistant HTN and has been placed on 4 different medications, underwent renal US. Was thought to have a right renal artery stenosis but after arteriogram that was found to be inaccurate.   Allergies  Allergen Reactions  . Codeine Itching    And hyper also  . Hydromorphone Itching  . Morphine And Related Itching     Current Outpatient Medications:  .  cloNIDine (CATAPRES) 0.1 MG tablet, Take 1 tablet (0.1 mg total) by mouth 3 (three) times daily., Disp: 90 tablet, Rfl: 3 .  Dulaglutide (TRULICITY) 1.5 MV/6.7MC SOPN, Inject 1.5 mg into the skin once a week. , Disp: , Rfl:  .  enalapril (VASOTEC) 20 MG tablet, TAKE 1 TABLETS BY MOUTH TWICE A DAY, Disp: 180 tablet, Rfl: 1 .  glimepiride (AMARYL) 4 MG tablet, TAKE 1 TABLET BY MOUTH EVERY DAY, Disp: 90 tablet, Rfl: 1 .  hydrochlorothiazide (HYDRODIURIL) 25 MG tablet, TAKE 1 TABLET BY MOUTH EVERY DAY, Disp: 90 tablet, Rfl: 1 .  metFORMIN (GLUCOPHAGE) 1000 MG tablet, TAKE ONE TABLET BY MOUTH TWICE A DAY WITH A MEAL, Disp: 180 tablet, Rfl: 1 .  metoprolol succinate (TOPROL-XL) 25 MG 24 hr tablet, Take 1 tablet (25 mg total) by mouth daily., Disp: 90 tablet, Rfl: 3 .  simvastatin (ZOCOR) 20 MG tablet, TAKE 1 TABLET BY MOUTH EVERY DAY IN THE EVENING, Disp: , Rfl:  .  verapamil (CALAN-SR) 240 MG CR tablet, Take 1 tablet (240 mg total) by mouth at bedtime., Disp: 90 tablet, Rfl: 1 .  atorvastatin (LIPITOR) 10 MG tablet, Take 1 tablet (10 mg total) by mouth daily. (Patient not taking: Reported on 04/12/2019), Disp: 90 tablet, Rfl: 3  Review of Systems   Constitutional: Negative.   Eyes: Negative for visual disturbance.  Respiratory: Negative.   Cardiovascular: Positive for chest pain (when BP up). Negative for palpitations and leg swelling.  Gastrointestinal: Negative.   Neurological: Positive for headaches (when BP up). Negative for dizziness.    Social History   Tobacco Use  . Smoking status: Former Smoker    Packs/day: 2.00    Years: 45.00    Pack years: 90.00    Types: Cigarettes  . Smokeless tobacco: Current User    Types: Snuff, Chew  . Tobacco comment: Quit in 2015  Substance Use Topics  . Alcohol use: No      Objective:   BP (!) 146/78 (BP Location: Left Arm, Patient Position: Sitting, Cuff Size: Large)   Pulse (!) 45   Temp 97.8 F (36.6 C) (Temporal)   Resp 16   Wt 213 lb (96.6 kg)   SpO2 98%   BMI 30.56 kg/m  Vitals:   07/04/19 1105  BP: (!) 146/78  Pulse: (!) 45  Resp: 16  Temp: 97.8 F (36.6 C)  TempSrc: Temporal  SpO2: 98%  Weight: 213 lb (96.6 kg)     Physical Exam Vitals signs reviewed.  Constitutional:      General: He is not in acute distress.    Appearance: Normal appearance. He is well-developed. He is obese.  He is not ill-appearing or diaphoretic.  HENT:     Head: Normocephalic and atraumatic.  Neck:     Musculoskeletal: Normal range of motion and neck supple.     Vascular: No JVD.  Cardiovascular:     Rate and Rhythm: Normal rate and regular rhythm.     Pulses: Normal pulses.     Heart sounds: Normal heart sounds. No murmur. No friction rub. No gallop.   Pulmonary:     Effort: Pulmonary effort is normal. No respiratory distress.     Breath sounds: Normal breath sounds. No wheezing or rales.  Musculoskeletal:     Right lower leg: No edema.     Left lower leg: No edema.  Neurological:     Mental Status: He is alert.      No results found for any visits on 07/04/19.     Assessment & Plan    1. Resistant hypertension Will change Enalapril to Olmesartan as below. EKG  shows sinus bradycardia rate of 47. Referral placed to cardiology as below for assistance with lowering BP. Call if worsening before cardiology appt or if not tolerating olmesartan.  - olmesartan (BENICAR) 40 MG tablet; Take 1 tablet (40 mg total) by mouth daily.  Dispense: 90 tablet; Refill: 1 - EKG 12-Lead - Ambulatory referral to Cardiology  2. DOE (dyspnea on exertion) See above medical treatment plan. - EKG 12-Lead - Ambulatory referral to Cardiology     Mar Daring, PA-C  [facility]

## 2019-07-12 ENCOUNTER — Encounter: Payer: Self-pay | Admitting: Physician Assistant

## 2019-07-24 ENCOUNTER — Ambulatory Visit (INDEPENDENT_AMBULATORY_CARE_PROVIDER_SITE_OTHER): Payer: Medicare Other | Admitting: Cardiology

## 2019-07-24 ENCOUNTER — Encounter: Payer: Self-pay | Admitting: Cardiology

## 2019-07-24 ENCOUNTER — Other Ambulatory Visit: Payer: Self-pay

## 2019-07-24 VITALS — BP 150/66 | HR 64 | Temp 98.1°F | Ht 70.0 in | Wt 212.5 lb

## 2019-07-24 DIAGNOSIS — I10 Essential (primary) hypertension: Secondary | ICD-10-CM | POA: Diagnosis not present

## 2019-07-24 DIAGNOSIS — R0609 Other forms of dyspnea: Secondary | ICD-10-CM

## 2019-07-24 MED ORDER — AMLODIPINE BESYLATE 10 MG PO TABS: 10.0000 mg | ORAL_TABLET | Freq: Every day | ORAL | 1 refills | Status: DC

## 2019-07-24 NOTE — Progress Notes (Signed)
Cardiology Office Note:    Date:  07/24/2019   ID:  Nicholas Nolan, DOB 10/14/1951, MRN [id number]  PCP:  Mar Daring, PA-C  Cardiologist:  No primary care provider on file.  Electrophysiologist:  None   Referring MD: Florian Buff*   Chief Complaint  Patient presents with  . New Patient (Initial Visit)    resistent hypertension and DOE    History of Present Illness:    Nicholas Nolan is a 68 y.o. male with a hx of hypertension, diabetes, hyperlipidemia, former smoker who presents due to difficulty in controlling hypertension.  He states having hypertension for years, has tried many drug regimens but blood pressures remain elevated.  States sometimes having chest pains when his blood pressure is severely elevated, but improved when his blood pressure is improved.  He notes sometimes feeling dizzy, fatigued, sitting starting new blood pressure regimen a few weeks ago. Renal ultrasound and renal angiogram were performed to evaluate for possible secondary causes but that was normal as per the patient.  He also notes having dyspnea on exertion which has gotten worse over the past couple of months.  He states feeling tired when working on his car, or walking around his home.  He denies lower extremity edema, orthopnea, hematuria.  He lives at home with his wife who is a Equities trader, and checks his blood pressure frequently.  Past Medical History:  Diagnosis Date  . Back pain    leg pain  . Diabetes mellitus    type 2  . Hyperlipidemia   . Hypertension   . Neuropathy    feet    Past Surgical History:  Procedure Laterality Date  . BASAL CELL CARCINOMA EXCISION    . LUMBAR LAMINECTOMY/DECOMPRESSION MICRODISCECTOMY  01/10/2012   Procedure: LUMBAR LAMINECTOMY/DECOMPRESSION MICRODISCECTOMY 2 LEVELS;  Surgeon: Hosie Spangle, MD;  Location: [facility];  Service: Neurosurgery;  Laterality: Bilateral;  Lumbar four-Sacral One laminectomies  . LUNG SURGERY Right  11/22/12   right upper lobe  . PROSTATE SURGERY     biopsy-due to elelvated PSA  . RENAL ANGIOGRAPHY Right 07/02/2019   Procedure: RENAL ANGIOGRAPHY;  Surgeon: Algernon Huxley, MD;  Location: [facility];  Service: Cardiovascular;  Laterality: Right;  . SHOULDER SURGERY    . VASECTOMY      Current Medications: Current Meds  Medication Sig  . cloNIDine (CATAPRES) 0.1 MG tablet Take 1 tablet (0.1 mg total) by mouth 3 (three) times daily.  . Dulaglutide (TRULICITY) 1.5 EU/2.3NT SOPN Inject 1.5 mg into the skin once a week.   Marland Kitchen glimepiride (AMARYL) 4 MG tablet TAKE 1 TABLET BY MOUTH EVERY DAY  . hydrochlorothiazide (HYDRODIURIL) 25 MG tablet TAKE 1 TABLET BY MOUTH EVERY DAY  . metFORMIN (GLUCOPHAGE) 1000 MG tablet TAKE ONE TABLET BY MOUTH TWICE A DAY WITH A MEAL  . olmesartan (BENICAR) 40 MG tablet Take 1 tablet (40 mg total) by mouth daily.  . simvastatin (ZOCOR) 20 MG tablet TAKE 1 TABLET BY MOUTH EVERY DAY IN THE EVENING  . verapamil (CALAN-SR) 240 MG CR tablet Take 1 tablet (240 mg total) by mouth at bedtime.  . [DISCONTINUED] AMLODIPINE BESYLATE PO Take 5 mg by mouth daily.  . [DISCONTINUED] metoprolol succinate (TOPROL-XL) 25 MG 24 hr tablet Take 1 tablet (25 mg total) by mouth daily.     Allergies:   Codeine, Hydromorphone, and Morphine and related   Social History   Socioeconomic History  . Marital status: Married  Spouse name: Not on file  . Number of children: 4  . Years of education: Not on file  . Highest education level: Associate degree: occupational, Hotel manager, or vocational program  Occupational History  . Not on file  Social Needs  . Financial resource strain: Not hard at all  . Food insecurity    Worry: Never true    Inability: Never true  . Transportation needs    Medical: No    Non-medical: Not on file  Tobacco Use  . Smoking status: Former Smoker    Packs/day: 2.00    Years: 45.00    Pack years: 90.00    Types: Cigarettes  . Smokeless tobacco:  Current User    Types: Snuff, Chew  . Tobacco comment: Quit in 2015  Substance and Sexual Activity  . Alcohol use: No  . Drug use: No  . Sexual activity: Not on file  Lifestyle  . Physical activity    Days per week: 0 days    Minutes per session: 0 min  . Stress: Not at all  Relationships  . Social Herbalist on phone: Patient refused    Gets together: Patient refused    Attends religious service: Patient refused    Active member of club or organization: Patient refused    Attends meetings of clubs or organizations: Patient refused    Relationship status: Patient refused  Other Topics Concern  . Not on file  Social History Narrative  . Not on file     Family History: The patient's family history includes Cancer in his brother and father; Heart disease in his father; Stroke in his father.  ROS:   Please see the history of present illness.     All other systems reviewed and are negative.  EKGs/Labs/Other Studies Reviewed:    The following studies were reviewed today:   EKG:  EKG is  ordered today.  The ekg ordered today demonstrates normal sinus rhythm, occasional PVC.  Recent Labs: 07/02/2019: BUN 47; Creatinine, Ser 1.75  Recent Lipid Panel    Component Value Date/Time   CHOL 204 (H) 05/13/2016 0827   TRIG 382 (H) 05/13/2016 0827   HDL 31 (L) 05/13/2016 0827   CHOLHDL 6.6 (H) 05/13/2016 0827   LDLCALC 97 05/13/2016 0827    Physical Exam:    VS:  BP (!) 150/66 (BP Location: Left Arm, Patient Position: Sitting, Cuff Size: Normal)   Pulse 64   Temp 98.1 F (36.7 C)   Ht 5\' 10"  (1.778 m)   Wt 212 lb 8 oz (96.4 kg)   SpO2 94%   BMI 30.49 kg/m     Wt Readings from Last 3 Encounters:  07/24/19 212 lb 8 oz (96.4 kg)  07/04/19 213 lb (96.6 kg)  07/02/19 212 lb (96.2 kg)     GEN:  Well nourished, well developed in no acute distress HEENT: Normal NECK: No JVD; No carotid bruits LYMPHATICS: No lymphadenopathy CARDIAC: RRR, no murmurs, rubs,  gallops RESPIRATORY:  Clear to auscultation without rales, wheezing or rhonchi  ABDOMEN: Soft, non-tender, non-distended MUSCULOSKELETAL:  No edema; No deformity  SKIN: Warm and dry NEUROLOGIC:  Alert and oriented x 3 PSYCHIATRIC:  Normal affect   ASSESSMENT:   Patient has difficult to control hypertension.  Beta-blocker might be contributing to his fatigue and wobbly nature.  1. Hypertension, unspecified type   2. DOE (dyspnea on exertion)    PLAN:    In order of problems listed above:  1.  Increase amlodipine to 10 mg daily, stop Toprol-XL.  Continue HCTZ 25 mg daily, continue Benicar 40 mg daily, continue verapamil 240 daily, continue clonidine 0.1 mg 3 times daily as prescribed.  Patient encouraged to check blood pressure daily. 2. We will obtain echocardiogram to evaluate for possible hypertensive heart disease. 3. Follow-up after echo.   Medication Adjustments/Labs and Tests Ordered: Current medicines are reviewed at length with the patient today.  Concerns regarding medicines are outlined above.  Orders Placed This Encounter  Procedures  . EKG 12-Lead  . ECHOCARDIOGRAM COMPLETE   Meds ordered this encounter  Medications  . amLODipine (NORVASC) 10 MG tablet    Sig: Take 1 tablet (10 mg total) by mouth daily.    Dispense:  90 tablet    Refill:  1    Patient Instructions  Medication Instructions:  Your physician has recommended you make the following change in your medication:  1- STOP Metoprolol succinate. 2- INCREASE Amlodipine to 10 mg by mouth once a day.  If you need a refill on your cardiac medications before your next appointment, please call your pharmacy.   Lab work: - None ordered.  If you have labs (blood work) drawn today and your tests are completely normal, you will receive your results only by: Marland Kitchen MyChart Message (if you have MyChart) OR . A paper copy in the mail If you have any lab test that is abnormal or we need to change your treatment, we  will call you to review the results.  Testing/Procedures: Your physician has requested that you have an echocardiogram. Echocardiography is a painless test that uses sound waves to create images of your heart. It provides your doctor with information about the size and shape of your heart and how well your heart's chambers and valves are working. This procedure takes approximately one hour. There are no restrictions for this procedure. You may get an IV, if needed, to receive an ultrasound enhancing agent through to better visualize your heart.     Follow-Up: At [facility], you and your health needs are our priority.  As part of our continuing mission to provide you with exceptional heart care, we have created designated Provider Care Teams.  These Care Teams include your primary Cardiologist (physician) and Advanced Practice Providers (APPs -  Physician Assistants and Nurse Practitioners) who all work together to provide you with the care you need, when you need it. You will need a follow up appointment in 4 weeks.  Please call our office 2 months in advance to schedule this appointment.  You may see Dr Garen Lah or one of the following Advanced Practice Providers on your designated Care Team:   Murray Hodgkins, NP Christell Faith, PA-C . Marrianne Mood, PA-C    Echocardiogram An echocardiogram is a procedure that uses painless sound waves (ultrasound) to produce an image of the heart. Images from an echocardiogram can provide important information about:  Signs of coronary artery disease (CAD).  Aneurysm detection. An aneurysm is a weak or damaged part of an artery wall that bulges out from the normal force of blood pumping through the body.  Heart size and shape. Changes in the size or shape of the heart can be associated with certain conditions, including heart failure, aneurysm, and CAD.  Heart muscle function.  Heart valve function.  Signs of a past heart attack.  Fluid  buildup around the heart.  Thickening of the heart muscle.  A tumor or infectious growth around the heart valves.  Tell a health care provider about:  Any allergies you have.  All medicines you are taking, including vitamins, herbs, eye drops, creams, and over-the-counter medicines.  Any blood disorders you have.  Any surgeries you have had.  Any medical conditions you have.  Whether you are pregnant or may be pregnant. What are the risks? Generally, this is a safe procedure. However, problems may occur, including:  Allergic reaction to dye (contrast) that may be used during the procedure. What happens before the procedure? No specific preparation is needed. You may eat and drink normally. What happens during the procedure?   An IV tube may be inserted into one of your veins.  You may receive contrast through this tube. A contrast is an injection that improves the quality of the pictures from your heart.  A gel will be applied to your chest.  A wand-like tool (transducer) will be moved over your chest. The gel will help to transmit the sound waves from the transducer.  The sound waves will harmlessly bounce off of your heart to allow the heart images to be captured in real-time motion. The images will be recorded on a computer. The procedure may vary among health care providers and hospitals. What happens after the procedure?  You may return to your normal, everyday life, including diet, activities, and medicines, unless your health care provider tells you not to do that. Summary  An echocardiogram is a procedure that uses painless sound waves (ultrasound) to produce an image of the heart.  Images from an echocardiogram can provide important information about the size and shape of your heart, heart muscle function, heart valve function, and fluid buildup around your heart.  You do not need to do anything to prepare before this procedure. You may eat and drink normally.   After the echocardiogram is completed, you may return to your normal, everyday life, unless your health care provider tells you not to do that. This information is not intended to replace advice given to you by your health care provider. Make sure you discuss any questions you have with your health care provider. Document Released: 10/22/2000 Document Revised: 02/15/2019 Document Reviewed: 11/27/2016 Elsevier Patient Education  2020 [place], Kate Sable, MD  07/24/2019 5:20 PM    [place]

## 2019-07-24 NOTE — Patient Instructions (Signed)
Medication Instructions:  Your physician has recommended you make the following change in your medication:  1- STOP Metoprolol succinate. 2- INCREASE Amlodipine to 10 mg by mouth once a day.  If you need a refill on your cardiac medications before your next appointment, please call your pharmacy.   Lab work: - None ordered.  If you have labs (blood work) drawn today and your tests are completely normal, you will receive your results only by: Marland Kitchen MyChart Message (if you have MyChart) OR . A paper copy in the mail If you have any lab test that is abnormal or we need to change your treatment, we will call you to review the results.  Testing/Procedures: Your physician has requested that you have an echocardiogram. Echocardiography is a painless test that uses sound waves to create images of your heart. It provides your doctor with information about the size and shape of your heart and how well your heart's chambers and valves are working. This procedure takes approximately one hour. There are no restrictions for this procedure. You may get an IV, if needed, to receive an ultrasound enhancing agent through to better visualize your heart.     Follow-Up: At [facility], you and your health needs are our priority.  As part of our continuing mission to provide you with exceptional heart care, we have created designated Provider Care Teams.  These Care Teams include your primary Cardiologist (physician) and Advanced Practice Providers (APPs -  Physician Assistants and Nurse Practitioners) who all work together to provide you with the care you need, when you need it. You will need a follow up appointment in 4 weeks.  Please call our office 2 months in advance to schedule this appointment.  You may see Dr Garen Lah or one of the following Advanced Practice Providers on your designated Care Team:   Murray Hodgkins, NP Christell Faith, PA-C . Marrianne Mood, PA-C    Echocardiogram An echocardiogram  is a procedure that uses painless sound waves (ultrasound) to produce an image of the heart. Images from an echocardiogram can provide important information about:  Signs of coronary artery disease (CAD).  Aneurysm detection. An aneurysm is a weak or damaged part of an artery wall that bulges out from the normal force of blood pumping through the body.  Heart size and shape. Changes in the size or shape of the heart can be associated with certain conditions, including heart failure, aneurysm, and CAD.  Heart muscle function.  Heart valve function.  Signs of a past heart attack.  Fluid buildup around the heart.  Thickening of the heart muscle.  A tumor or infectious growth around the heart valves. Tell a health care provider about:  Any allergies you have.  All medicines you are taking, including vitamins, herbs, eye drops, creams, and over-the-counter medicines.  Any blood disorders you have.  Any surgeries you have had.  Any medical conditions you have.  Whether you are pregnant or may be pregnant. What are the risks? Generally, this is a safe procedure. However, problems may occur, including:  Allergic reaction to dye (contrast) that may be used during the procedure. What happens before the procedure? No specific preparation is needed. You may eat and drink normally. What happens during the procedure?   An IV tube may be inserted into one of your veins.  You may receive contrast through this tube. A contrast is an injection that improves the quality of the pictures from your heart.  A gel will be  applied to your chest.  A wand-like tool (transducer) will be moved over your chest. The gel will help to transmit the sound waves from the transducer.  The sound waves will harmlessly bounce off of your heart to allow the heart images to be captured in real-time motion. The images will be recorded on a computer. The procedure may vary among health care providers and  hospitals. What happens after the procedure?  You may return to your normal, everyday life, including diet, activities, and medicines, unless your health care provider tells you not to do that. Summary  An echocardiogram is a procedure that uses painless sound waves (ultrasound) to produce an image of the heart.  Images from an echocardiogram can provide important information about the size and shape of your heart, heart muscle function, heart valve function, and fluid buildup around your heart.  You do not need to do anything to prepare before this procedure. You may eat and drink normally.  After the echocardiogram is completed, you may return to your normal, everyday life, unless your health care provider tells you not to do that. This information is not intended to replace advice given to you by your health care provider. Make sure you discuss any questions you have with your health care provider. Document Released: 10/22/2000 Document Revised: 02/15/2019 Document Reviewed: 11/27/2016 Elsevier Patient Education  2020 [employer].

## 2019-07-30 ENCOUNTER — Other Ambulatory Visit: Payer: Self-pay | Admitting: Physician Assistant

## 2019-07-30 DIAGNOSIS — I10 Essential (primary) hypertension: Secondary | ICD-10-CM

## 2019-08-04 ENCOUNTER — Other Ambulatory Visit: Payer: Self-pay | Admitting: Physician Assistant

## 2019-08-04 DIAGNOSIS — E119 Type 2 diabetes mellitus without complications: Secondary | ICD-10-CM

## 2019-08-06 ENCOUNTER — Ambulatory Visit (INDEPENDENT_AMBULATORY_CARE_PROVIDER_SITE_OTHER): Payer: Medicare Other | Admitting: Nurse Practitioner

## 2019-08-10 LAB — HEMOGLOBIN A1C: Hemoglobin A1C: 6.9

## 2019-08-14 ENCOUNTER — Other Ambulatory Visit: Payer: Self-pay | Admitting: Cardiology

## 2019-08-14 DIAGNOSIS — R0609 Other forms of dyspnea: Secondary | ICD-10-CM

## 2019-08-16 NOTE — Progress Notes (Signed)
Cardiology Office Note    Date:  08/28/2019   ID:  Nicholas Nolan, DOB 11-24-1950, MRN [id number]  PCP:  Nicholas Daring, PA-C  Cardiologist:  Nicholas Sable, MD  Electrophysiologist:  None   Chief Complaint: Follow up  History of Present Illness:   Nicholas Nolan is a 68 y.o. male with history of DM, HLD, difficult to control HTN, lung cancer s/p partial lobectomy in 2016, prior tobacco abuse, neuropathy, and back pain who presents for follow up of HTN.   He was seen by our office in consult on 07/24/2019 and noted a long history of hypertension that has been difficult to control on many drug regimens. He noted occasional chest pain when his BP was severely elevated with improvement in pain when his BP was improved. There was some dizziness and fatigue associated with starting a new BP medication several weeks prior to his appointment with [place] in 07/2019. Renal ultrasound in 05/2019 showed elevated peak systolic velocity in the mid segment of the right renal artery with follow up renal angiogram in 06/2019 showed no more than 15% stenosis on selective imaging of the bilateral renal arteries. Secondly, he reported worsening DOE over the past several months with increased exertional fatigue. BP at his visit with [place] was 150/66. His amlodipine was titrated to 10 mg. Toprol XL was stopped as there was some concern this may have been playing a role in his fatigue. He was continued on HCTZ 25 mg, Benicar 40 mg, verapamil 240 mg, clonidine 0.1 mg tid. Echo showed an EF of 60 to 65%, mild LVH, diastolic dysfunction, normal RV systolic function and cavity size, and mild mitral vegetation.   Patient comes in feeling slightly better following discontinuation of metoprolol.  However, he does continue to note significant fatigue which she attributes to starting antihypertensive medications.  He also continues to note exertional dyspnea and chest pain which have been unchanged over the past several  months.  He also attributes some of his chest pain to elevated BP readings.  He indicates his wife checks his blood pressure 2-3 times per week with readings ranging from 094-709 systolic with diastolic readings in the 62E to 60s.  He has been compliant with medications.  He has never had a sleep study though tells me "I know I have sleep apnea."  He reports his wife has told him he snores quite loudly and has had apneic episodes.  He denies any palpitations, dizziness, presyncope, syncope, lower extremity swelling, abdominal distention, orthopnea, PND, early satiety.  No falls since he was last seen.   Labs: 08/2019 - potassium 4.9, serum creatinine 1.6, A1c 6.9 06/2019 - BUN 47, SCr 1.75 11/2017 - total cholesterol 160, triglyceride 111, HDL 37, LDL 100   Past Medical History:  Diagnosis Date  . Back pain    leg pain  . Diabetes mellitus    type 2  . Hyperlipidemia   . Hypertension   . Neuropathy    feet    Past Surgical History:  Procedure Laterality Date  . BASAL CELL CARCINOMA EXCISION    . LUMBAR LAMINECTOMY/DECOMPRESSION MICRODISCECTOMY  01/10/2012   Procedure: LUMBAR LAMINECTOMY/DECOMPRESSION MICRODISCECTOMY 2 LEVELS;  Surgeon: Nicholas Spangle, MD;  Location: [facility];  Service: Neurosurgery;  Laterality: Bilateral;  Lumbar four-Sacral One laminectomies  . LUNG SURGERY Right 11/22/12   right upper lobe  . PROSTATE SURGERY     biopsy-due to elelvated PSA  . RENAL ANGIOGRAPHY Right 07/02/2019   Procedure:  RENAL ANGIOGRAPHY;  Surgeon: Nicholas Huxley, MD;  Location: [facility];  Service: Cardiovascular;  Laterality: Right;  . SHOULDER SURGERY    . VASECTOMY      Current Medications: Current Meds  Medication Sig  . amLODipine (NORVASC) 10 MG tablet Take 1 tablet (10 mg total) by mouth daily.  . cloNIDine (CATAPRES) 0.1 MG tablet TAKE 1 TABLET (0.1 MG TOTAL) BY MOUTH 3 (THREE) TIMES DAILY.  . Dulaglutide (TRULICITY) 1.5 GU/4.4IH SOPN Inject 1.5 mg into the skin once  a week.   Marland Kitchen glimepiride (AMARYL) 4 MG tablet TAKE 1 TABLET BY MOUTH EVERY DAY  . hydrochlorothiazide (HYDRODIURIL) 25 MG tablet TAKE 1 TABLET BY MOUTH EVERY DAY  . metFORMIN (GLUCOPHAGE) 1000 MG tablet TAKE ONE TABLET BY MOUTH TWICE A DAY WITH A MEAL  . olmesartan (BENICAR) 40 MG tablet Take 1 tablet (40 mg total) by mouth daily.  . simvastatin (ZOCOR) 20 MG tablet TAKE 1 TABLET BY MOUTH EVERY DAY IN THE EVENING  . verapamil (CALAN-SR) 240 MG CR tablet Take 1 tablet (240 mg total) by mouth at bedtime.  . [DISCONTINUED] metFORMIN (GLUCOPHAGE) 1000 MG tablet TAKE ONE TABLET BY MOUTH TWICE A DAY WITH A MEAL    Allergies:   Codeine, Hydromorphone, and Morphine and related   Social History   Socioeconomic History  . Marital status: Married    Spouse name: Not on file  . Number of children: 4  . Years of education: Not on file  . Highest education level: Associate degree: occupational, Hotel manager, or vocational program  Occupational History  . Not on file  Social Needs  . Financial resource strain: Not hard at all  . Food insecurity    Worry: Never true    Inability: Never true  . Transportation needs    Medical: No    Non-medical: Not on file  Tobacco Use  . Smoking status: Former Smoker    Packs/day: 2.00    Years: 45.00    Pack years: 90.00    Types: Cigarettes  . Smokeless tobacco: Current User    Types: Snuff, Chew  . Tobacco comment: Quit in 2015  Substance and Sexual Activity  . Alcohol use: No  . Drug use: No  . Sexual activity: Not on file  Lifestyle  . Physical activity    Days per week: 0 days    Minutes per session: 0 min  . Stress: Not at all  Relationships  . Social Herbalist on phone: Patient refused    Gets together: Patient refused    Attends religious service: Patient refused    Active member of club or organization: Patient refused    Attends meetings of clubs or organizations: Patient refused    Relationship status: Patient refused   Other Topics Concern  . Not on file  Social History Narrative  . Not on file     Family History:  The patient's family history includes Cancer in his brother and father; Heart disease in his father; Stroke in his father.  ROS:   Review of Systems  Constitutional: Positive for malaise/fatigue. Negative for chills, diaphoresis, fever and weight loss.  HENT: Negative for congestion.   Eyes: Negative for discharge and redness.  Respiratory: Positive for shortness of breath. Negative for cough, hemoptysis, sputum production and wheezing.   Cardiovascular: Positive for chest pain. Negative for palpitations, orthopnea, claudication, leg swelling and PND.  Gastrointestinal: Negative for abdominal pain, blood in stool, heartburn, melena, nausea  and vomiting.  Genitourinary: Negative for hematuria.  Musculoskeletal: Negative for falls and myalgias.  Skin: Negative for rash.  Neurological: Positive for weakness. Negative for dizziness, tingling, tremors, sensory change, speech change, focal weakness and loss of consciousness.  Endo/Heme/Allergies: Does not bruise/bleed easily.  Psychiatric/Behavioral: Negative for substance abuse. The patient is not nervous/anxious.   All other systems reviewed and are negative.    EKGs/Labs/Other Studies Reviewed:    Studies reviewed were summarized above. The additional studies were reviewed today:  2D Echo 08/24/2019: 1. Left ventricular ejection fraction, by visual estimation, is 60 to 65%. The left ventricle has normal function. Normal left ventricular size. There is mildly increased left ventricular hypertrophy.  2. Left ventricular diastolic Doppler parameters are consistent with impaired relaxation pattern of LV diastolic filling.  3. Global right ventricle has normal systolic function.The right ventricular size is normal. No increase in right ventricular wall thickness.  4. Left atrial size was normal.  5. TR signal is inadequate for assessing  pulmonary artery systolic pressure.   EKG:  EKG is ordered today.  The EKG ordered today demonstrates NSR, 67 bpm, normal axis, no acute ST-T changes  Recent Labs: 07/02/2019: BUN 47; Creatinine, Ser 1.75  Recent Lipid Panel    Component Value Date/Time   CHOL 204 (H) 05/13/2016 0827   TRIG 382 (H) 05/13/2016 0827   HDL 31 (L) 05/13/2016 0827   CHOLHDL 6.6 (H) 05/13/2016 0827   LDLCALC 97 05/13/2016 0827    PHYSICAL EXAM:    VS:  BP (!) 140/40 (BP Location: Left Arm, Patient Position: Sitting, Cuff Size: Normal)   Pulse 67   Temp 97.7 F (36.5 C)   Ht 5\' 10"  (1.778 m)   Wt 213 lb (96.6 kg)   BMI 30.56 kg/m   BMI: Body mass index is 30.56 kg/m.  Physical Exam  Constitutional: He is oriented to person, place, and time. He appears well-developed and well-nourished.  Recheck BP 152/62  HENT:  Head: Normocephalic and atraumatic.  Eyes: Right eye exhibits no discharge. Left eye exhibits no discharge.  Neck: Normal range of motion. No JVD present.  Cardiovascular: Normal rate, regular rhythm, S1 normal, S2 normal and normal heart sounds. Exam reveals no distant heart sounds, no friction rub, no midsystolic click and no opening snap.  No murmur heard. Pulses:      Posterior tibial pulses are 2+ on the right side and 2+ on the left side.  Pulmonary/Chest: Effort normal and breath sounds normal. No respiratory distress. He has no decreased breath sounds. He has no wheezes. He has no rales. He exhibits no tenderness.  Abdominal: Soft. He exhibits no distension. There is no abdominal tenderness.  Musculoskeletal:        General: No edema.  Neurological: He is alert and oriented to person, place, and time.  Skin: Skin is warm and dry. No cyanosis. Nails show no clubbing.  Psychiatric: He has a normal mood and affect. His speech is normal and behavior is normal. Judgment and thought content normal.  Vitals reviewed.   Wt Readings from Last 3 Encounters:  08/28/19 213 lb (96.6 kg)   07/24/19 212 lb 8 oz (96.4 kg)  07/04/19 213 lb (96.6 kg)     ASSESSMENT & PLAN:   1. Refractory hypertension: BP is improved today.  Documented work-up for secondary hypertension thus far appears to have included renal artery ultrasound and renal artery angiogram which was unrevealing.  Given patient's noted fatigue and refractory hypertension cannot exclude underlying/undiagnosed  sleep apnea.  Recommend sleep study as below.  If this is unrevealing would follow-up with 8 AM cortisol as well as plasma renin aldosterone ratio.  Would need to come off of ARB prior to undergoing PRA ratio.  If this is unrevealing would recommend 24-hour urine and plasma catecholamines/metanephrines.  For now, continue current therapy including amlodipine, clonidine, HCTZ, Benicar, and verapamil.  I suspect clonidine is also playing a role in his underlying fatigue and this should be tapered pending the above sleep study results.  Would also use chlorthalidone in place of HCTZ.  2. Exertional chest pain/shortness of breath: Currently chest pain-free.  Recent echo as outlined above.  Schedule coronary CTA.  If he is found to have significant coronary calcification would recommend addition of aspirin.  3. Hyperlipidemia: Followed by PCP.  Pending the results of his coronary CTA we may need to further escalate his lipid therapy.  Remains on simvastatin for now.  4. Sleep disordered breathing: Patient states, "I am almost certain I have sleep apnea."  No prior sleep study.  Cannot exclude underlying sleep apnea driving his hypertension and fatigue.  Refer for sleep study.  5. Obesity: Weight loss advised.  Disposition: F/u with Dr. Garen Lah or an APP in 4 to 6 weeks.   Medication Adjustments/Labs and Tests Ordered: Current medicines are reviewed at length with the patient today.  Concerns regarding medicines are outlined above. Medication changes, Labs and Tests ordered today are summarized above and listed in the  Patient Instructions accessible in Encounters.   Signed, Christell Faith, PA-C 08/28/2019 1:41 PM     [address], [place] [address] [phone]

## 2019-08-24 ENCOUNTER — Other Ambulatory Visit: Payer: Self-pay

## 2019-08-24 ENCOUNTER — Ambulatory Visit (INDEPENDENT_AMBULATORY_CARE_PROVIDER_SITE_OTHER): Payer: Medicare Other

## 2019-08-24 DIAGNOSIS — R0609 Other forms of dyspnea: Secondary | ICD-10-CM

## 2019-08-24 DIAGNOSIS — R0600 Dyspnea, unspecified: Secondary | ICD-10-CM

## 2019-08-28 ENCOUNTER — Encounter: Payer: Self-pay | Admitting: Physician Assistant

## 2019-08-28 ENCOUNTER — Ambulatory Visit (INDEPENDENT_AMBULATORY_CARE_PROVIDER_SITE_OTHER): Payer: Medicare Other | Admitting: Physician Assistant

## 2019-08-28 ENCOUNTER — Other Ambulatory Visit: Payer: Self-pay

## 2019-08-28 VITALS — BP 140/40 | HR 67 | Temp 97.7°F | Ht 70.0 in | Wt 213.0 lb

## 2019-08-28 DIAGNOSIS — R072 Precordial pain: Secondary | ICD-10-CM

## 2019-08-28 DIAGNOSIS — R0600 Dyspnea, unspecified: Secondary | ICD-10-CM

## 2019-08-28 DIAGNOSIS — R5383 Other fatigue: Secondary | ICD-10-CM

## 2019-08-28 DIAGNOSIS — I208 Other forms of angina pectoris: Secondary | ICD-10-CM

## 2019-08-28 DIAGNOSIS — G4730 Sleep apnea, unspecified: Secondary | ICD-10-CM | POA: Diagnosis not present

## 2019-08-28 DIAGNOSIS — R0609 Other forms of dyspnea: Secondary | ICD-10-CM

## 2019-08-28 DIAGNOSIS — I10 Essential (primary) hypertension: Secondary | ICD-10-CM | POA: Diagnosis not present

## 2019-08-28 DIAGNOSIS — E785 Hyperlipidemia, unspecified: Secondary | ICD-10-CM

## 2019-08-28 NOTE — Patient Instructions (Addendum)
Medication Instructions:  Your physician recommends that you continue on your current medications as directed. Please refer to the Current Medication list given to you today. If you need a refill on your cardiac medications before your next appointment, please call your pharmacy*  Lab Work: None ordered If you have labs (blood work) drawn today and your tests are completely normal, you will receive your results only by: Marland Kitchen MyChart Message (if you have MyChart) OR . A paper copy in the mail If you have any lab test that is abnormal or we need to change your treatment, we will call you to review the results.  Testing/Procedures: 1- Your cardiac CT will be scheduled at one of the below locations:   [facility] [address] [place], [place] [address] ([phone] [place] [address] [place], [place] [address] [phone]  If scheduled at [facility], please arrive at the [facility] main entrance of [facility] 30-45 minutes prior to test start time. Proceed to the [facility] Radiology Department (first floor) to check-in and test prep.  If scheduled at [facility], please arrive 15 mins early for check-in and test prep.  Please follow these instructions carefully (unless otherwise directed):  Hold all erectile dysfunction medications at least 3 days (72 hrs) prior to test.  On the Night Before the Test: . Be sure to Drink plenty of water. . Do not consume any caffeinated/decaffeinated beverages or chocolate 12 hours prior to your test. . Do not take any antihistamines 12 hours prior to your test.  On the Day of the Test: . Drink plenty of water. Do not drink any water within one hour of the test. . Do not eat any food 4 hours prior to the test. . You may take your regular medications prior to the test.  . Take metoprolol (Lopressor) two hours prior to  test. . HOLD Furosemide/Hydrochlorothiazide morning of the test. . FEMALES- please wear underwire-free bra if available        After the Test: . Drink plenty of water. . After receiving IV contrast, you may experience a mild flushed feeling. This is normal. . On occasion, you may experience a mild rash up to 24 hours after the test. This is not dangerous. If this occurs, you can take Benadryl 25 mg and increase your fluid intake. . If you experience trouble breathing, this can be serious. If it is severe call 911 IMMEDIATELY. If it is mild, please call our office. . If you take any of these medications: Glipizide/Metformin, Avandament, Glucavance, please do not take 48 hours after completing test unless otherwise instructed.    Please contact the cardiac imaging nurse navigator should you have any questions/concerns Marchia Bond, RN Navigator Cardiac Imaging [name] [phone] Office     Follow-Up: At [facility], you and your health needs are our priority.  As part of our continuing mission to provide you with exceptional heart care, we have created designated Provider Care Teams.  These Care Teams include your primary Cardiologist (physician) and Advanced Practice Providers (APPs -  Physician Assistants and Nurse Practitioners) who all work together to provide you with the care you need, when you need it.  Your next appointment:   6 weeks  The format for your next appointment:   In Person  Provider:    You may see Kate Sable, MD or Christell Faith, PA-C.   -  Ref to Pulmonology for sleep study

## 2019-08-30 NOTE — Addendum Note (Signed)
Addended by: Verlon Au on: 08/30/2019 04:29 PM   Modules accepted: Orders

## 2019-09-10 ENCOUNTER — Ambulatory Visit (INDEPENDENT_AMBULATORY_CARE_PROVIDER_SITE_OTHER): Payer: Medicare Other | Admitting: Family Medicine

## 2019-09-10 DIAGNOSIS — J3489 Other specified disorders of nose and nasal sinuses: Secondary | ICD-10-CM

## 2019-09-10 DIAGNOSIS — R6883 Chills (without fever): Secondary | ICD-10-CM | POA: Diagnosis not present

## 2019-09-10 NOTE — Progress Notes (Signed)
Patient: Nicholas Nolan Male    DOB: [birth year]   67 y.o.   MRN: [id number] Visit Date: 09/10/2019  Today's Provider: Lavon Paganini, MD   Chief Complaint  Patient presents with  . Chills   Subjective:     HPI  Virtual Visit via Telephone Note  I connected with Nicholas Nolan on 09/10/19 at  3:20 PM EST by telephone and verified that I am speaking with the correct person using two identifiers.   Patient location: home Provider location: [place] involved in the visit: patient, provider   I discussed the limitations, risks, security and privacy concerns of performing an evaluation and management service by telephone and the availability of in person appointments. I also discussed with the patient that there may be a patient responsible charge related to this service. The patient expressed understanding and agreed to proceed.   Reports that temperature is normal now. Chills last night and temperature this AM was 99.75F - got better with heating pad Has some rhinorrhea No cough or SOB Throat was a little sore this AM Feels like glands are swollen This time of year he tends to get allergic rhinitis +Fatigue, but thinks this is secondary to Clonidine  No known sick contacts  Allergies  Allergen Reactions  . Codeine Itching    And hyper also  . Hydromorphone Itching  . Morphine And Related Itching     Current Outpatient Medications:  .  amLODipine (NORVASC) 10 MG tablet, Take 1 tablet (10 mg total) by mouth daily., Disp: 90 tablet, Rfl: 1 .  cloNIDine (CATAPRES) 0.1 MG tablet, TAKE 1 TABLET (0.1 MG TOTAL) BY MOUTH 3 (THREE) TIMES DAILY., Disp: 270 tablet, Rfl: 1 .  Dulaglutide (TRULICITY) 1.5 QM/2.5OI SOPN, Inject 1.5 mg into the skin once a week. , Disp: , Rfl:  .  FARXIGA 10 MG TABS tablet, Take 10 mg by mouth every morning., Disp: , Rfl:  .  glimepiride (AMARYL) 4 MG tablet, TAKE 1 TABLET BY MOUTH EVERY DAY, Disp: 90 tablet, Rfl: 1  .  hydrochlorothiazide (HYDRODIURIL) 25 MG tablet, TAKE 1 TABLET BY MOUTH EVERY DAY, Disp: 90 tablet, Rfl: 1 .  metFORMIN (GLUCOPHAGE) 1000 MG tablet, TAKE ONE TABLET BY MOUTH TWICE A DAY WITH A MEAL, Disp: 180 tablet, Rfl: 1 .  olmesartan (BENICAR) 40 MG tablet, Take 1 tablet (40 mg total) by mouth daily., Disp: 90 tablet, Rfl: 1 .  simvastatin (ZOCOR) 20 MG tablet, TAKE 1 TABLET BY MOUTH EVERY DAY IN THE EVENING, Disp: , Rfl:  .  verapamil (CALAN-SR) 240 MG CR tablet, Take 1 tablet (240 mg total) by mouth at bedtime., Disp: 90 tablet, Rfl: 1  Review of Systems  Constitutional: Positive for chills and fatigue. Negative for activity change, appetite change, diaphoresis, fever and unexpected weight change.  HENT: Positive for sore throat.   Respiratory: Negative.   Musculoskeletal: Positive for myalgias.    Social History   Tobacco Use  . Smoking status: Former Smoker    Packs/day: 2.00    Years: 45.00    Pack years: 90.00    Types: Cigarettes  . Smokeless tobacco: Current User    Types: Snuff, Chew  . Tobacco comment: Quit in 2015  Substance Use Topics  . Alcohol use: No      Objective:   There were no vitals taken for this visit. There were no vitals filed for this visit.There is no height or weight on file to calculate BMI.  Physical Exam Speaks in full sentences, NAD  No results found for any visits on 09/10/19.     Assessment & Plan    Follow Up Instructions:   I discussed the assessment and treatment plan with the patient. The patient was provided an opportunity to ask questions and all were answered. The patient agreed with the plan and demonstrated an understanding of the instructions.   The patient was advised to call back or seek an in-person evaluation if the symptoms worsen or if the condition fails to improve as anticipated.  1. Chills 2. Rhinorrhea -New problem for less than 12 hours and now resolved -He did have a slightly elevated temperature, but  no fever this morning -His symptoms other than the chills, which have resolved, feels similar to his typical seasonal allergies -Advised him to self isolate today monitor his symptoms and then if they are still present or worse tomorrow to go and get a Covid test outpatient -Advised that if he is feeling totally back to himself without any symptoms and that persists into tomorrow, he likely does not need the Covid tested -Discussed symptomatic management and return precautions   The entirety of the information documented in the History of Present Illness, Review of Systems and Physical Exam were personally obtained by me. Portions of this information were initially documented by Ashley Royalty, CMA and reviewed by me for thoroughness and accuracy.    , Dionne Bucy, MD MPH [facility]

## 2019-09-18 ENCOUNTER — Other Ambulatory Visit: Payer: Self-pay | Admitting: Physician Assistant

## 2019-09-18 DIAGNOSIS — I10 Essential (primary) hypertension: Secondary | ICD-10-CM

## 2019-09-26 ENCOUNTER — Encounter: Payer: Self-pay | Admitting: *Deleted

## 2019-09-26 ENCOUNTER — Ambulatory Visit: Payer: Self-pay | Admitting: *Deleted

## 2019-09-26 NOTE — Telephone Encounter (Signed)
Please call and schedule patient to come in. Needs A1c done

## 2019-09-26 NOTE — Telephone Encounter (Signed)
This encounter was created in error - please disregard.

## 2019-09-26 NOTE — Telephone Encounter (Signed)
Theadora Rama, RN with [facility] called with findings from her visit with this patient yesterday.  She stated the patient had + protein and 4+ sugar in his urine. His b/p 170/71 HR 76. Pt denied having pain. Notified [employer]. Routing to the practice.

## 2019-09-26 NOTE — Telephone Encounter (Signed)
Tried calling patient. Left message to call back. OK for [facility] to advise patient as below.

## 2019-09-26 NOTE — Telephone Encounter (Signed)
From [place], [place]

## 2019-09-28 NOTE — Telephone Encounter (Signed)
scheduled

## 2019-09-28 NOTE — Progress Notes (Signed)
Patient: Nicholas Nolan Male    DOB: Mar 16, 1951   68 y.o.   MRN: [id number] Visit Date: 10/01/2019  Today's Provider: Mar Daring, PA-C   Chief Complaint  Patient presents with  . Follow-up   Subjective:    I,Joseline E. Rosas,RMA am acting as a Education administrator for [employer], PA-C.  HPI   Diabetes Mellitus Type II, Follow-up:   Lab Results  Component Value Date   HGBA1C 6.9 08/10/2019   HGBA1C 7.4 03/23/2017    Patient saw Endocrine 10/09 Counseled him that diabetes is well controlled, however due to worsened renal insufficiency and persistent proteinuria, will add a SGLT-2i.  Add Farxiga 10 mg daily.  - Reduce glimepiride to 2 mg daily.  - Continue metformin and Trulicity, as prescribed.  - Patient was advised of the importance of regular monitoring of blood sugars.  - Instructed to monitor blood sugars twice daily. Reminded to bring blood sugar log and/or meter to every visit (Endocrine) - Continue statin.  - Keep scheduled dilated eye exam and have report sent to me for review. Reports that he went back to the eye doctor a month ago.  - Follow up(with Endocrine) in 6 months with labs prior, or sooner if needed.   Most Recent Eye Exam: eye exam was in 04/2018 with Dr. Ellin Mayhew in [place] and followed up a month ago.  Weight trend: stable Current diet: well balanced Current exercise: walking  ------------------------------------------------------------------------   Hypertension, follow-up:  BP Readings from Last 3 Encounters:  10/01/19 (!) 140/50  08/28/19 (!) 140/40  07/24/19 (!) 150/66   He is adherent to low salt diet.   Outside blood pressures are 160's-180's/70's-60's. He is experiencing chest pressure/discomfort and fatigue.  Patient denies chest pain, lower extremity edema and near-syncope.   Cardiovascular risk factors include advanced age (older than 65 for men, 35 for women), diabetes mellitus, dyslipidemia, hypertension, male gender  and smoking/ tobacco exposure.   He is followed by Cardiology and reports that the Cardiologist is also aware that the Clonidine makes him very fatigued. They were wanting to taper dose.   Patient is awaiting a sleep study, reports no available appointments until January.   Also awaiting coronary calcium CT.  ------------------------------------------------------------------------    Lipid/Cholesterol, Follow-up:   Last Lipid Panel:    Component Value Date/Time   CHOL 204 (H) 05/13/2016 0827   TRIG 382 (H) 05/13/2016 0827   HDL 31 (L) 05/13/2016 0827   CHOLHDL 6.6 (H) 05/13/2016 0827   LDLCALC 97 05/13/2016 0827    He reports excellent compliance with treatment. He is not having side effects.   Wt Readings from Last 3 Encounters:  10/01/19 219 lb (99.3 kg)  08/28/19 213 lb (96.6 kg)  07/24/19 212 lb 8 oz (96.4 kg)    ------------------------------------------------------------------------   Allergies  Allergen Reactions  . Codeine Itching    And hyper also  . Hydromorphone Itching  . Morphine And Related Itching     Current Outpatient Medications:  .  amLODipine (NORVASC) 10 MG tablet, Take 1 tablet (10 mg total) by mouth daily., Disp: 90 tablet, Rfl: 1 .  cloNIDine (CATAPRES) 0.1 MG tablet, TAKE 1 TABLET (0.1 MG TOTAL) BY MOUTH 3 (THREE) TIMES DAILY. (Patient taking differently: Take 0.1 mg by mouth 3 (three) times daily. ), Disp: 270 tablet, Rfl: 1 .  Dulaglutide (TRULICITY) 1.5 HQ/7.5FF SOPN, Inject 1.5 mg into the skin once a week. , Disp: , Rfl:  .  FARXIGA 10 MG TABS tablet, Take 10 mg by mouth every morning., Disp: , Rfl:  .  glimepiride (AMARYL) 4 MG tablet, TAKE 1 TABLET BY MOUTH EVERY DAY, Disp: 90 tablet, Rfl: 1 .  hydrochlorothiazide (HYDRODIURIL) 25 MG tablet, TAKE 1 TABLET BY MOUTH EVERY DAY, Disp: 90 tablet, Rfl: 1 .  metFORMIN (GLUCOPHAGE) 1000 MG tablet, TAKE ONE TABLET BY MOUTH TWICE A DAY WITH A MEAL, Disp: 180 tablet, Rfl: 1 .  simvastatin  (ZOCOR) 20 MG tablet, TAKE 1 TABLET BY MOUTH EVERY DAY IN THE EVENING, Disp: , Rfl:  .  verapamil (CALAN-SR) 240 MG CR tablet, Take 1 tablet (240 mg total) by mouth at bedtime., Disp: 90 tablet, Rfl: 1 .  amLODipine (NORVASC) 5 MG tablet, Take 5 mg by mouth daily., Disp: , Rfl:  .  olmesartan (BENICAR) 40 MG tablet, Take 1 tablet (40 mg total) by mouth daily. (Patient not taking: Reported on 10/01/2019), Disp: 90 tablet, Rfl: 1  Review of Systems  Constitutional: Positive for fatigue. Negative for chills and fever.  HENT: Negative.   Respiratory: Negative.   Cardiovascular: Positive for chest pain ("off and on" specially when his blood pressure is high).  Gastrointestinal: Negative.   Endocrine: Negative for polydipsia, polyphagia and polyuria.  Neurological: Negative.     Social History   Tobacco Use  . Smoking status: Former Smoker    Packs/day: 2.00    Years: 45.00    Pack years: 90.00    Types: Cigarettes  . Smokeless tobacco: Current User    Types: Snuff, Chew  . Tobacco comment: Quit in 2015  Substance Use Topics  . Alcohol use: No      Objective:   BP (!) 140/50 (BP Location: Left Arm, Patient Position: Sitting, Cuff Size: Large)   Pulse 65   Temp 97.7 F (36.5 C) (Temporal)   Resp 16   Wt 219 lb (99.3 kg)   SpO2 99%   BMI 31.42 kg/m  Vitals:   10/01/19 1121  BP: (!) 140/50  Pulse: 65  Resp: 16  Temp: 97.7 F (36.5 C)  TempSrc: Temporal  SpO2: 99%  Weight: 219 lb (99.3 kg)  Body mass index is 31.42 kg/m.   Physical Exam Vitals signs reviewed.  Constitutional:      General: He is not in acute distress.    Appearance: Normal appearance. He is well-developed. He is obese. He is not ill-appearing or diaphoretic.  HENT:     Head: Normocephalic and atraumatic.  Neck:     Musculoskeletal: Normal range of motion and neck supple.  Cardiovascular:     Rate and Rhythm: Normal rate and regular rhythm.     Heart sounds: Normal heart sounds. No murmur. No  friction rub. No gallop.   Pulmonary:     Effort: Pulmonary effort is normal. No respiratory distress.     Breath sounds: Normal breath sounds. No wheezing or rales.  Neurological:     Mental Status: He is alert.      Results for orders placed or performed in visit on 10/01/19  Hemoglobin A1c  Result Value Ref Range   Hemoglobin A1C 6.9        Assessment & Plan    1. Resistant hypertension Patient had stated he was not taking Olmesartan, but then was unsure. Advised him to continue olmesartan until the cardiologist was ready to possibly perform the other test (cortisol, aldosterone, 24 hr urine). Will change HCTZ to chorthalidone as below per cardiologist recommendation. Continue Verapamil  ER 240mg . Decrease clonidine to 0.1mg  once daily. Has f/u with cardiology on 10/12/19.  - chlorthalidone (HYGROTON) 25 MG tablet; Take 1 tablet (25 mg total) by mouth daily.  Dispense: 90 tablet; Refill: 0 - olmesartan (BENICAR) 40 MG tablet; Take 1 tablet (40 mg total) by mouth daily.  Dispense: 90 tablet; Refill: 1  2. Type 2 diabetes mellitus without complication, without long-term current use of insulin ([place]) Patient reports most recent A1c done by the [facility] nurse was 5.3 (done last week). Continue Glimiperide 2mg  (as requested by endocrinology), farxiga 10mg , and trulicity 1.5mg  weekly. Decrease metformin 1000mg  to once daily (due to renal function).  - Hemoglobin A1c - glimepiride (AMARYL) 4 MG tablet; Take 0.5 tablets (2 mg total) by mouth daily.  Dispense: 90 tablet; Refill: 1 - metFORMIN (GLUCOPHAGE) 1000 MG tablet; Take 1 tablet (1,000 mg total) by mouth daily with breakfast.  Dispense: 90 tablet; Refill: 1  3. Essential hypertension See above medical treatment plan for #1.  - cloNIDine (CATAPRES) 0.1 MG tablet; Take 1 tablet (0.1 mg total) by mouth daily.  Dispense: 90 tablet; Refill: 1 - verapamil (CALAN-SR) 240 MG CR tablet; Take 1 tablet (240 mg total) by mouth at bedtime.  Dispense: 90  tablet; Refill: 1  4. Daytime somnolence Has sleep study ordered but no available appointments until January. He has a friend that works at [employer] center he is going to contact to see if they have any earlier appointments. If so, we will help facilitate.   5. Snoring See above medical treatment plan.  6. Class 1 obesity due to excess calories with serious comorbidity and body mass index (BMI) of 31.0 to 31.9 in adult Counseled patient on healthy lifestyle modifications including dieting and exercise.     Mar Daring, PA-C  [facility]

## 2019-10-01 ENCOUNTER — Ambulatory Visit: Payer: Medicare Other | Admitting: Physician Assistant

## 2019-10-01 ENCOUNTER — Encounter: Payer: Self-pay | Admitting: Physician Assistant

## 2019-10-01 ENCOUNTER — Other Ambulatory Visit: Payer: Self-pay

## 2019-10-01 VITALS — BP 140/50 | HR 65 | Temp 97.7°F | Resp 16 | Wt 219.0 lb

## 2019-10-01 DIAGNOSIS — E6609 Other obesity due to excess calories: Secondary | ICD-10-CM

## 2019-10-01 DIAGNOSIS — I10 Essential (primary) hypertension: Secondary | ICD-10-CM

## 2019-10-01 DIAGNOSIS — R400 Somnolence: Secondary | ICD-10-CM

## 2019-10-01 DIAGNOSIS — Z6831 Body mass index (BMI) 31.0-31.9, adult: Secondary | ICD-10-CM

## 2019-10-01 DIAGNOSIS — E119 Type 2 diabetes mellitus without complications: Secondary | ICD-10-CM

## 2019-10-01 DIAGNOSIS — R0683 Snoring: Secondary | ICD-10-CM | POA: Diagnosis not present

## 2019-10-01 MED ORDER — VERAPAMIL HCL ER 240 MG PO TBCR: 240.0000 mg | EXTENDED_RELEASE_TABLET | Freq: Every day | ORAL | 1 refills | Status: DC

## 2019-10-01 MED ORDER — OLMESARTAN MEDOXOMIL 40 MG PO TABS: 40.0000 mg | ORAL_TABLET | Freq: Every day | ORAL | 1 refills | Status: DC

## 2019-10-01 MED ORDER — METFORMIN HCL 1000 MG PO TABS: 1000.0000 mg | ORAL_TABLET | Freq: Every day | ORAL | 1 refills | Status: DC

## 2019-10-01 MED ORDER — CLONIDINE HCL 0.1 MG PO TABS: 0.1000 mg | ORAL_TABLET | Freq: Every day | ORAL | 1 refills | Status: DC

## 2019-10-01 MED ORDER — GLIMEPIRIDE 4 MG PO TABS: 2.0000 mg | ORAL_TABLET | Freq: Every day | ORAL | 1 refills | Status: DC

## 2019-10-01 MED ORDER — CHLORTHALIDONE 25 MG PO TABS: 25.0000 mg | ORAL_TABLET | Freq: Every day | ORAL | 0 refills | Status: DC

## 2019-10-01 NOTE — Patient Instructions (Signed)
Stop Hydrochlorothiazide (HCTZ), Start Chlorthalidone 25mg   Make sure to continue Olmesartan 40mg  and Verapamil 240mg   Decrease Clonidine 0.1mg  to once daily  Decrease Metformin 1000mg  to once daily  Call about sleep study

## 2019-10-02 ENCOUNTER — Other Ambulatory Visit: Payer: Self-pay

## 2019-10-02 ENCOUNTER — Observation Stay
Admission: EM | Admit: 2019-10-02 | Discharge: 2019-10-03 | Disposition: A | Discharge status: Home / Self Care | Payer: Medicare Other | Attending: Internal Medicine | Admitting: Internal Medicine

## 2019-10-02 ENCOUNTER — Encounter: Payer: Self-pay | Admitting: Emergency Medicine

## 2019-10-02 ENCOUNTER — Telehealth: Payer: Medicare Other | Admitting: Adult Health

## 2019-10-02 DIAGNOSIS — R339 Retention of urine, unspecified: Secondary | ICD-10-CM | POA: Insufficient documentation

## 2019-10-02 DIAGNOSIS — Z20828 Contact with and (suspected) exposure to other viral communicable diseases: Secondary | ICD-10-CM | POA: Insufficient documentation

## 2019-10-02 DIAGNOSIS — Z87891 Personal history of nicotine dependence: Secondary | ICD-10-CM | POA: Diagnosis not present

## 2019-10-02 DIAGNOSIS — E1122 Type 2 diabetes mellitus with diabetic chronic kidney disease: Secondary | ICD-10-CM | POA: Insufficient documentation

## 2019-10-02 DIAGNOSIS — E875 Hyperkalemia: Secondary | ICD-10-CM | POA: Diagnosis not present

## 2019-10-02 DIAGNOSIS — N401 Enlarged prostate with lower urinary tract symptoms: Secondary | ICD-10-CM | POA: Diagnosis not present

## 2019-10-02 DIAGNOSIS — N179 Acute kidney failure, unspecified: Principal | ICD-10-CM | POA: Diagnosis present

## 2019-10-02 DIAGNOSIS — Z902 Acquired absence of lung [part of]: Secondary | ICD-10-CM | POA: Diagnosis not present

## 2019-10-02 DIAGNOSIS — Z85118 Personal history of other malignant neoplasm of bronchus and lung: Secondary | ICD-10-CM | POA: Diagnosis not present

## 2019-10-02 DIAGNOSIS — E1169 Type 2 diabetes mellitus with other specified complication: Secondary | ICD-10-CM

## 2019-10-02 DIAGNOSIS — D649 Anemia, unspecified: Secondary | ICD-10-CM | POA: Diagnosis present

## 2019-10-02 DIAGNOSIS — E1140 Type 2 diabetes mellitus with diabetic neuropathy, unspecified: Secondary | ICD-10-CM | POA: Insufficient documentation

## 2019-10-02 DIAGNOSIS — E119 Type 2 diabetes mellitus without complications: Secondary | ICD-10-CM

## 2019-10-02 DIAGNOSIS — R338 Other retention of urine: Secondary | ICD-10-CM | POA: Diagnosis present

## 2019-10-02 DIAGNOSIS — N189 Chronic kidney disease, unspecified: Secondary | ICD-10-CM | POA: Diagnosis not present

## 2019-10-02 DIAGNOSIS — Z885 Allergy status to narcotic agent status: Secondary | ICD-10-CM | POA: Diagnosis not present

## 2019-10-02 DIAGNOSIS — I152 Hypertension secondary to endocrine disorders: Secondary | ICD-10-CM | POA: Diagnosis present

## 2019-10-02 DIAGNOSIS — E1159 Type 2 diabetes mellitus with other circulatory complications: Secondary | ICD-10-CM | POA: Diagnosis present

## 2019-10-02 DIAGNOSIS — Z7984 Long term (current) use of oral hypoglycemic drugs: Secondary | ICD-10-CM | POA: Diagnosis not present

## 2019-10-02 DIAGNOSIS — I129 Hypertensive chronic kidney disease with stage 1 through stage 4 chronic kidney disease, or unspecified chronic kidney disease: Secondary | ICD-10-CM | POA: Insufficient documentation

## 2019-10-02 DIAGNOSIS — Z79899 Other long term (current) drug therapy: Secondary | ICD-10-CM | POA: Insufficient documentation

## 2019-10-02 DIAGNOSIS — E785 Hyperlipidemia, unspecified: Secondary | ICD-10-CM | POA: Diagnosis not present

## 2019-10-02 DIAGNOSIS — I10 Essential (primary) hypertension: Secondary | ICD-10-CM | POA: Insufficient documentation

## 2019-10-02 LAB — CBC WITH DIFFERENTIAL/PLATELET
Abs Immature Granulocytes: 0.03 10*3/uL (ref 0.00–0.07)
Basophils Absolute: 0.1 10*3/uL (ref 0.0–0.1)
Basophils Relative: 1 %
Eosinophils Absolute: 0.1 10*3/uL (ref 0.0–0.5)
Eosinophils Relative: 1 %
HCT: 36.1 % — ABNORMAL LOW (ref 39.0–52.0)
Hemoglobin: 11.8 g/dL — ABNORMAL LOW (ref 13.0–17.0)
Immature Granulocytes: 0 %
Lymphocytes Relative: 19 %
Lymphs Abs: 1.7 10*3/uL (ref 0.7–4.0)
MCH: 30.1 pg (ref 26.0–34.0)
MCHC: 32.7 g/dL (ref 30.0–36.0)
MCV: 92.1 fL (ref 80.0–100.0)
Monocytes Absolute: 0.4 10*3/uL (ref 0.1–1.0)
Monocytes Relative: 5 %
Neutro Abs: 6.5 10*3/uL (ref 1.7–7.7)
Neutrophils Relative %: 74 %
Platelets: 259 10*3/uL (ref 150–400)
RBC: 3.92 MIL/uL — ABNORMAL LOW (ref 4.22–5.81)
RDW: 13.2 % (ref 11.5–15.5)
WBC: 8.8 10*3/uL (ref 4.0–10.5)
nRBC: 0 % (ref 0.0–0.2)

## 2019-10-02 LAB — BASIC METABOLIC PANEL
Anion gap: 11 (ref 5–15)
Anion gap: 9 (ref 5–15)
BUN: 50 mg/dL — ABNORMAL HIGH (ref 8–23)
BUN: 51 mg/dL — ABNORMAL HIGH (ref 8–23)
CO2: 22 mmol/L (ref 22–32)
CO2: 22 mmol/L (ref 22–32)
Calcium: 10 mg/dL (ref 8.9–10.3)
Calcium: 9 mg/dL (ref 8.9–10.3)
Chloride: 105 mmol/L (ref 98–111)
Chloride: 108 mmol/L (ref 98–111)
Creatinine, Ser: 1.87 mg/dL — ABNORMAL HIGH (ref 0.61–1.24)
Creatinine, Ser: 1.65 mg/dL — ABNORMAL HIGH (ref 0.61–1.24)
GFR calc Af Amer: 42 mL/min — ABNORMAL LOW (ref >60)
GFR calc Af Amer: 49 mL/min — ABNORMAL LOW (ref >60)
GFR calc non Af Amer: 36 mL/min — ABNORMAL LOW (ref >60)
GFR calc non Af Amer: 42 mL/min — ABNORMAL LOW (ref >60)
Glucose, Bld: 117 mg/dL — ABNORMAL HIGH (ref 70–99)
Glucose, Bld: 133 mg/dL — ABNORMAL HIGH (ref 70–99)
Potassium: 6.3 mmol/L (ref 3.5–5.1)
Potassium: 4.2 mmol/L (ref 3.5–5.1)
Sodium: 138 mmol/L (ref 135–145)
Sodium: 139 mmol/L (ref 135–145)

## 2019-10-02 LAB — URINALYSIS, COMPLETE (UACMP) WITH MICROSCOPIC
Bacteria, UA: NONE SEEN
Bilirubin Urine: NEGATIVE
Glucose, UA: >=500 mg/dL — AB
Hgb urine dipstick: NEGATIVE
Ketones, ur: NEGATIVE mg/dL
Leukocytes,Ua: NEGATIVE
Nitrite: NEGATIVE
Protein, ur: 100 mg/dL — AB
Specific Gravity, Urine: 1.011 (ref 1.005–1.030)
Squamous Epithelial / HPF: NONE SEEN (ref 0–5)
pH: 5 (ref 5.0–8.0)

## 2019-10-02 LAB — IRON AND TIBC
Iron: 64 ug/dL (ref 45–182)
Saturation Ratios: 16 % — ABNORMAL LOW (ref 17.9–39.5)
TIBC: 402 ug/dL (ref 250–450)
UIBC: 338 ug/dL

## 2019-10-02 LAB — FERRITIN: Ferritin: 25 ng/mL (ref 24–336)

## 2019-10-02 LAB — FOLATE: Folate: 8.9 ng/mL (ref >5.9)

## 2019-10-02 MED ORDER — ACETAMINOPHEN 500 MG PO TABS
1000.0000 mg | ORAL_TABLET | Freq: Once | ORAL | Status: AC
  Administered 2019-10-02: 1000 mg via ORAL
  Filled 2019-10-02: qty 2

## 2019-10-02 MED ORDER — DEXTROSE 50 % IV SOLN
1.0000 | Freq: Once | INTRAVENOUS | Status: AC
  Administered 2019-10-02: 50 mL via INTRAVENOUS
  Filled 2019-10-02: qty 50

## 2019-10-02 MED ORDER — SODIUM BICARBONATE 8.4 % IV SOLN
50.0000 meq | Freq: Once | INTRAVENOUS | Status: AC
  Administered 2019-10-02: 50 meq via INTRAVENOUS
  Filled 2019-10-02: qty 50

## 2019-10-02 MED ORDER — ENOXAPARIN SODIUM 40 MG/0.4ML ~~LOC~~ SOLN
40.0000 mg | SUBCUTANEOUS | Status: DC
  Administered 2019-10-03: 40 mg via SUBCUTANEOUS
  Filled 2019-10-02: qty 0.4

## 2019-10-02 MED ORDER — TAMSULOSIN HCL 0.4 MG PO CAPS: 0.4000 mg | ORAL_CAPSULE | Freq: Every day | ORAL | Status: DC

## 2019-10-02 MED ORDER — TAMSULOSIN HCL 0.4 MG PO CAPS: 0.4000 mg | ORAL_CAPSULE | Freq: Every day | ORAL | 0 refills | Status: DC

## 2019-10-02 MED ORDER — INSULIN ASPART 100 UNIT/ML ~~LOC~~ SOLN
5.0000 [IU] | Freq: Once | SUBCUTANEOUS | Status: AC
  Administered 2019-10-02: 5 [IU] via INTRAVENOUS
  Filled 2019-10-02: qty 1

## 2019-10-02 MED ORDER — SODIUM CHLORIDE 0.9 % IV BOLUS
500.0000 mL | Freq: Once | INTRAVENOUS | Status: AC
  Administered 2019-10-02: 500 mL via INTRAVENOUS

## 2019-10-02 MED ORDER — SODIUM ZIRCONIUM CYCLOSILICATE 10 G PO PACK
10.0000 g | PACK | Freq: Every day | ORAL | Status: DC
  Administered 2019-10-02 – 2019-10-03 (×2): 10 g via ORAL
  Filled 2019-10-02 (×2): qty 1

## 2019-10-02 NOTE — ED Notes (Signed)
Pt's daughter Jarrett Soho updated on patient's status with pt's permission

## 2019-10-02 NOTE — H&P (Signed)
History and Physical    Nicholas Nolan IZT:[id number] DOB: April 11, 1951 DOA: 10/02/2019  PCP: Mar Daring, PA-C  Patient coming from: Home  I have personally briefly reviewed patient's old medical records in [place]  Chief Complaint: Urinary retention  HPI: Nicholas Nolan is a 68 y.o. male with medical history significant of lung cancer status post right lobectomy, hypertension, type 2 diabetes, BPH, hypothyroidism, and hyperlipidemia who presents with concerns of urinary retention.  Patient reports that he chronically has trouble with starting urine streams in the morning but would get better as the day progress. However today he was making very little urine, dribbling and had urgency along with lower abdominal pain around his bladder. No dysuria. He has never had a history of urinary retention. He used to follow with urology outpatient but stopped going for some time after his doctor left.  ED Course: He was afebrile and hypertensive up to 250s over 78 on room air. CBC shows no leukocytosis and had new anemia of 11.8 from a prior of 13 from 3 years ago. BMP shows potassium of 6.3, glucose of 117, creatinine of 1.87 (prior of 1.75-3 months ago) and GFR of 36. Negative UA. EKG shows NSR with no significant in T wave compare to prior.   Foley catheter was placed and reportedly had over 1L of urine output. He was given amp of bicarb, Lokelma, amp of D50 and 5units of insulin in the ED.  Former tobacco user. Denies alcohol or illicit drug use.  Review of Systems:  Constitutional: No Weight Change, No Fever ENT/Mouth: No sore throat, No Rhinorrhea Eyes: No Eye Pain, No Vision Changes Cardiovascular: No Chest Pain, no SOB Respiratory: No Cough, No Sputum, No Wheezing, no Dyspnea  Gastrointestinal: No Nausea, No Vomiting, No Diarrhea, No Constipation, + Pain Genitourinary: no Urinary Incontinence, + Urgency, No Flank Pain Musculoskeletal: No Arthralgias, No Myalgias Skin:  No Skin Lesions, No Pruritus, Neuro: no Weakness, No Numbness,  No Loss of Consciousness, No Syncope Psych: No Anxiety/Panic, No Depression, no decrease appetite Heme/Lymph: No Bruising, No Bleeding  Past Medical History:  Diagnosis Date  . Back pain    leg pain  . Diabetes mellitus    type 2  . Hyperlipidemia   . Hypertension   . Neuropathy    feet    Past Surgical History:  Procedure Laterality Date  . BASAL CELL CARCINOMA EXCISION    . LUMBAR LAMINECTOMY/DECOMPRESSION MICRODISCECTOMY  01/10/2012   Procedure: LUMBAR LAMINECTOMY/DECOMPRESSION MICRODISCECTOMY 2 LEVELS;  Surgeon: Hosie Spangle, MD;  Location: [facility];  Service: Neurosurgery;  Laterality: Bilateral;  Lumbar four-Sacral One laminectomies  . LUNG SURGERY Right 11/22/12   right upper lobe  . PROSTATE SURGERY     biopsy-due to elelvated PSA  . RENAL ANGIOGRAPHY Right 07/02/2019   Procedure: RENAL ANGIOGRAPHY;  Surgeon: Algernon Huxley, MD;  Location: [facility];  Service: Cardiovascular;  Laterality: Right;  . SHOULDER SURGERY    . VASECTOMY       reports that he has quit smoking. His smoking use included cigarettes. He has a 90.00 pack-year smoking history. His smokeless tobacco use includes snuff and chew. He reports that he does not drink alcohol or use drugs.  Allergies  Allergen Reactions  . Codeine Itching    And hyper also  . Hydromorphone Itching  . Morphine And Related Itching    Family History  Problem Relation Age of Onset  . Cancer Father  prostate  . Heart disease Father        MI  . Stroke Father   . Cancer Brother        liver     Prior to Admission medications   Medication Sig Start Date End Date Taking? Authorizing Provider  amLODipine (NORVASC) 10 MG tablet Take 1 tablet (10 mg total) by mouth daily. 07/24/19 10/22/19  Kate Sable, MD  chlorthalidone (HYGROTON) 25 MG tablet Take 1 tablet (25 mg total) by mouth daily. 10/01/19   Mar Daring, PA-C   cloNIDine (CATAPRES) 0.1 MG tablet Take 1 tablet (0.1 mg total) by mouth daily. 10/01/19   Mar Daring, PA-C  Dulaglutide (TRULICITY) 1.5 WU/9.8JX SOPN Inject 1.5 mg into the skin once a week.  12/17/15   [provider]  FARXIGA 10 MG TABS tablet Take 10 mg by mouth every morning. 08/31/19   [provider]  glimepiride (AMARYL) 4 MG tablet Take 0.5 tablets (2 mg total) by mouth daily. 10/01/19   Mar Daring, PA-C  metFORMIN (GLUCOPHAGE) 1000 MG tablet Take 1 tablet (1,000 mg total) by mouth daily with breakfast. 10/01/19   Mar Daring, PA-C  olmesartan (BENICAR) 40 MG tablet Take 1 tablet (40 mg total) by mouth daily. 10/01/19   Mar Daring, PA-C  simvastatin (ZOCOR) 20 MG tablet TAKE 1 TABLET BY MOUTH EVERY DAY IN THE EVENING 06/12/18   [provider]  tamsulosin (FLOMAX) 0.4 MG CAPS capsule Take 1 capsule (0.4 mg total) by mouth daily after supper. 10/02/19   Merlyn Lot, MD  verapamil (CALAN-SR) 240 MG CR tablet Take 1 tablet (240 mg total) by mouth at bedtime. 10/01/19   Mar Daring, PA-C  metFORMIN (GLUCOPHAGE) 1000 MG tablet TAKE ONE TABLET BY MOUTH TWICE A DAY WITH A MEAL 03/14/18   Mar Daring, [place]    Physical Exam: Vitals:   10/02/19 1440 10/02/19 1442 10/02/19 1625  BP:  (!) 215/78 (!) 163/60  Pulse:  88 77  Resp:  20 18  Temp:  98.2 F (36.8 C)   TempSrc:  Oral   SpO2:  100% 100%  Weight: 99.3 kg      Constitutional: NAD, calm, comfortable, non-toxic appearing male laying in bed at 30 degree incline Vitals:   10/02/19 1440 10/02/19 1442 10/02/19 1625  BP:  (!) 215/78 (!) 163/60  Pulse:  88 77  Resp:  20 18  Temp:  98.2 F (36.8 C)   TempSrc:  Oral   SpO2:  100% 100%  Weight: 99.3 kg     Eyes: PERRL, lids and conjunctivae normal ENMT: Mucous membranes are moist. Posterior pharynx clear of any exudate or lesions Neck: normal, supple, no masses. Respiratory: clear to auscultation  bilaterally, no wheezing, no crackles. Normal respiratory effort. No accessory muscle use.  Cardiovascular: Regular rate and rhythm, no murmurs / rubs / gallops. No extremity edema. 2+ pedal pulses. No carotid bruits.  Abdomen: Mild tenderness to lower abdomen, no masses palpated. Bowel sounds positive.  GU: Foley catheter in place with 350 cc urine output Musculoskeletal: no clubbing / cyanosis. No joint deformity upper and lower extremities. Good ROM, no contractures. Normal muscle tone.  Skin: no rashes, lesions, ulcers. No induration Neurologic: CN 2-12 grossly intact. Sensation intact. Strength 5/5 in all 4.  Psychiatric: Normal judgment and insight. Alert and oriented x 3. Normal mood.    Labs on Admission: I have personally reviewed following labs and imaging studies  CBC: Recent Labs  Lab 10/02/19 1728  WBC 8.8  NEUTROABS 6.5  HGB 11.8*  HCT 36.1*  MCV 92.1  PLT 324   Basic Metabolic Panel: Recent Labs  Lab 10/02/19 1728  NA 138  K 6.3*  CL 105  CO2 22  GLUCOSE 117*  BUN 50*  CREATININE 1.87*  CALCIUM 10.0   GFR: Estimated Creatinine Clearance: 44.7 mL/min (A) (by C-G formula based on SCr of 1.87 mg/dL (H)). Liver Function Tests: No results for input(s): AST, ALT, ALKPHOS, BILITOT, PROT, ALBUMIN in the last 168 hours. No results for input(s): LIPASE, AMYLASE in the last 168 hours. No results for input(s): AMMONIA in the last 168 hours. Coagulation Profile: No results for input(s): INR, PROTIME in the last 168 hours. Cardiac Enzymes: No results for input(s): CKTOTAL, CKMB, CKMBINDEX, TROPONINI in the last 168 hours. BNP (last 3 results) No results for input(s): PROBNP in the last 8760 hours. HbA1C: No results for input(s): HGBA1C in the last 72 hours. CBG: No results for input(s): GLUCAP in the last 168 hours. Lipid Profile: No results for input(s): CHOL, HDL, LDLCALC, TRIG, CHOLHDL, LDLDIRECT in the last 72 hours. Thyroid Function Tests: No results for  input(s): TSH, T4TOTAL, FREET4, T3FREE, THYROIDAB in the last 72 hours. Anemia Panel: No results for input(s): VITAMINB12, FOLATE, FERRITIN, TIBC, IRON, RETICCTPCT in the last 72 hours. Urine analysis:    Component Value Date/Time   COLORURINE STRAW (A) 10/02/2019 1500   APPEARANCEUR CLEAR (A) 10/02/2019 1500   LABSPEC 1.011 10/02/2019 1500   PHURINE 5.0 10/02/2019 1500   GLUCOSEU >=500 (A) 10/02/2019 1500   HGBUR NEGATIVE 10/02/2019 1500   BILIRUBINUR NEGATIVE 10/02/2019 1500   BILIRUBINUR neg 11/29/2016 1242   KETONESUR NEGATIVE 10/02/2019 1500   PROTEINUR 100 (A) 10/02/2019 1500   UROBILINOGEN 0.2 11/29/2016 1242   NITRITE NEGATIVE 10/02/2019 1500   LEUKOCYTESUR NEGATIVE 10/02/2019 1500    Radiological Exams on Admission: No results found.  EKG: Independently reviewed.   Assessment/Plan  Urinary retention in the setting of BPH - keep urinary catheter in place- had over 1L output - consult urology since he has followed with them outpatient and might be candidate for mechanical reduction of prostate and recommendation on length of time to keep foley in place - continue Flomax  Hyperkalemia - 6.3 on admission - given amp of bicarb, Lokelma, amp of D50 and 5 units of insulin in the ED. - will repeat BMP now   Acute kidney injury -Creatinine of 1.87 likely from urinary retention - follow up BMP before any further renal labs  Anemia - Hemoglobin of 11.8 - last normal was 3 years ago - obtain Folic, Vitamin M01 and FOBT- althought he denies any dark stools  Hypertension -Continue amlodipine, clonidine, verapamil -Hold chlorothiazide, olmesartan due to AKI  Type 2 diabetes- HbA1C of 6.9 in Oct -Hold home diabetic medication - BG 117. Monitor for now before adding sliding scale.  DVT prophylaxis:.Lovenox Code Status: Full Family Communication: Plan discussed with patient at bedside  disposition Plan: Home with observation Consults called: Urology Admission status:  Observation  Nicholas Rauda T Dashiell Franchino DO [facility]   If 7PM-7AM, please contact night-coverage [email or web address] Password [email or web address]  10/02/2019, 7:28 PM

## 2019-10-02 NOTE — ED Provider Notes (Addendum)
[facility] Emergency Department Provider Note    First MD Initiated Contact with Patient 10/02/19 1621     (approximate)  I have reviewed the triage vital signs and the nursing notes.   HISTORY  Chief Complaint Urinary Retention    HPI Nicholas Nolan is a 68 y.o. male the below listed past medical history presents to the ER for evaluation of acute urinary retention.  States the last time he was able to empty his bladder was yesterday evening around 11:00.  States that he was able to get a few dribbles out this morning but started having progressively worsening pressure and discomfort so he presented to the ER.  Was found to have over 1L in his bladder.  Was in moderate to severe pain and discomfort.  No recent fevers.  No new medications.  Does have a reported history of enlarged prostate.  Not on any medications for that.    Past Medical History:  Diagnosis Date  . Back pain    leg pain  . Diabetes mellitus    type 2  . Hyperlipidemia   . Hypertension   . Neuropathy    feet   Family History  Problem Relation Age of Onset  . Cancer Father        prostate  . Heart disease Father        MI  . Stroke Father   . Cancer Brother        liver   Past Surgical History:  Procedure Laterality Date  . BASAL CELL CARCINOMA EXCISION    . LUMBAR LAMINECTOMY/DECOMPRESSION MICRODISCECTOMY  01/10/2012   Procedure: LUMBAR LAMINECTOMY/DECOMPRESSION MICRODISCECTOMY 2 LEVELS;  Surgeon: Hosie Spangle, MD;  Location: [facility];  Service: Neurosurgery;  Laterality: Bilateral;  Lumbar four-Sacral One laminectomies  . LUNG SURGERY Right 11/22/12   right upper lobe  . PROSTATE SURGERY     biopsy-due to elelvated PSA  . RENAL ANGIOGRAPHY Right 07/02/2019   Procedure: RENAL ANGIOGRAPHY;  Surgeon: Algernon Huxley, MD;  Location: [place] [facility];  Service: Cardiovascular;  Laterality: Right;  . SHOULDER SURGERY    . VASECTOMY     Patient Active Problem List   Diagnosis Date Noted  . Basal cell carcinoma of skin 05/07/2016  . Benign prostatic hypertrophy with lower urinary tract symptoms (LUTS) 05/07/2016  . Abnormal prostate specific antigen 05/07/2016  . ED (erectile dysfunction) of organic origin 05/07/2016  . Hyperthyroidism 05/07/2016  . LBP (low back pain) 05/07/2016  . Lung mass 05/07/2016  . Allergic rhinitis, seasonal 05/07/2016  . Compulsive tobacco user syndrome 05/07/2016  . Diabetes mellitus without complication ([place]) 99/37/1696  . Diabetes ([place]) 04/24/2015  . Resistant hypertension 04/24/2015  . Hyperlipemia 04/21/2015  . Controlled type 2 diabetes mellitus without complication, without long-term current use of insulin ([place]) 12/09/2014  . HLD (hyperlipidemia) 12/09/2014  . Cancer of lung ([place]) 12/09/2014      Prior to Admission medications   Medication Sig Start Date End Date Taking? Authorizing Provider  amLODipine (NORVASC) 10 MG tablet Take 1 tablet (10 mg total) by mouth daily. 07/24/19 10/22/19  Kate Sable, MD  chlorthalidone (HYGROTON) 25 MG tablet Take 1 tablet (25 mg total) by mouth daily. 10/01/19   Mar Daring, PA-C  cloNIDine (CATAPRES) 0.1 MG tablet Take 1 tablet (0.1 mg total) by mouth daily. 10/01/19   Mar Daring, PA-C  Dulaglutide (TRULICITY) 1.5 VE/9.3YB SOPN Inject 1.5 mg into the skin once a week.  12/17/15   [provider]  FARXIGA 10 MG TABS tablet Take 10 mg by mouth every morning. 08/31/19   [provider]  glimepiride (AMARYL) 4 MG tablet Take 0.5 tablets (2 mg total) by mouth daily. 10/01/19   Mar Daring, PA-C  metFORMIN (GLUCOPHAGE) 1000 MG tablet Take 1 tablet (1,000 mg total) by mouth daily with breakfast. 10/01/19   Mar Daring, PA-C  olmesartan (BENICAR) 40 MG tablet Take 1 tablet (40 mg total) by mouth daily. 10/01/19   Mar Daring, PA-C  simvastatin (ZOCOR) 20 MG tablet TAKE 1 TABLET BY MOUTH EVERY DAY IN THE EVENING 06/12/18    [provider]  tamsulosin (FLOMAX) 0.4 MG CAPS capsule Take 1 capsule (0.4 mg total) by mouth daily after supper. 10/02/19   Merlyn Lot, MD  verapamil (CALAN-SR) 240 MG CR tablet Take 1 tablet (240 mg total) by mouth at bedtime. 10/01/19   Mar Daring, PA-C  metFORMIN (GLUCOPHAGE) 1000 MG tablet TAKE ONE TABLET BY MOUTH TWICE A DAY WITH A MEAL 03/14/18   Mar Daring, PA-C    Allergies Codeine, Hydromorphone, and Morphine and related    Social History Social History   Tobacco Use  . Smoking status: Former Smoker    Packs/day: 2.00    Years: 45.00    Pack years: 90.00    Types: Cigarettes  . Smokeless tobacco: Current User    Types: Snuff, Chew  . Tobacco comment: Quit in 2015  Substance Use Topics  . Alcohol use: No  . Drug use: No    Review of Systems Patient denies headaches, rhinorrhea, blurry vision, numbness, shortness of breath, chest pain, edema, cough, abdominal pain, nausea, vomiting, diarrhea, dysuria, fevers, rashes or hallucinations unless otherwise stated above in HPI. ____________________________________________   PHYSICAL EXAM:  VITAL SIGNS: Vitals:   10/02/19 1442 10/02/19 1625  BP: (!) 215/78 (!) 163/60  Pulse: 88 77  Resp: 20 18  Temp: 98.2 F (36.8 C)   SpO2: 100% 100%    Constitutional: Alert and oriented.  Eyes: Conjunctivae are normal.  Head: Atraumatic. Nose: No congestion/rhinnorhea. Mouth/Throat: Mucous membranes are moist.   Neck: No stridor. Painless ROM.  Cardiovascular: Normal rate, regular rhythm. Grossly normal heart sounds.  Good peripheral circulation. Respiratory: Normal respiratory effort.  No retractions. Lungs CTAB. Gastrointestinal: Soft with suprapubic fullness and ttp. No distention. No abdominal bruits. No CVA tenderness. Genitourinary: normal external genitalia Musculoskeletal: No lower extremity tenderness nor edema.  No joint effusions. Neurologic:  Normal speech and language. No  gross focal neurologic deficits are appreciated. No facial droop Skin:  Skin is warm, dry and intact. No rash noted. Psychiatric: Mood and affect are normal. Speech and behavior are normal.  ____________________________________________   LABS (all labs ordered are listed, but only abnormal results are displayed)  Results for orders placed or performed during the hospital encounter of 10/02/19 (from the past 24 hour(s))  Urinalysis, Complete w Microscopic     Status: Abnormal   Collection Time: 10/02/19  3:00 PM  Result Value Ref Range   Color, Urine STRAW (A) YELLOW   APPearance CLEAR (A) CLEAR   Specific Gravity, Urine 1.011 1.005 - 1.030   pH 5.0 5.0 - 8.0   Glucose, UA >=500 (A) NEGATIVE mg/dL   Hgb urine dipstick NEGATIVE NEGATIVE   Bilirubin Urine NEGATIVE NEGATIVE   Ketones, ur NEGATIVE NEGATIVE mg/dL   Protein, ur 100 (A) NEGATIVE mg/dL   Nitrite NEGATIVE NEGATIVE   Leukocytes,Ua NEGATIVE NEGATIVE  RBC / HPF 0-5 0 - 5 RBC/hpf   WBC, UA 0-5 0 - 5 WBC/hpf   Bacteria, UA NONE SEEN NONE SEEN   Squamous Epithelial / LPF NONE SEEN 0 - 5  CBC with Differential     Status: Abnormal   Collection Time: 10/02/19  5:28 PM  Result Value Ref Range   WBC 8.8 4.0 - 10.5 K/uL   RBC 3.92 (L) 4.22 - 5.81 MIL/uL   Hemoglobin 11.8 (L) 13.0 - 17.0 g/dL   HCT 36.1 (L) 39.0 - 52.0 %   MCV 92.1 80.0 - 100.0 fL   MCH 30.1 26.0 - 34.0 pg   MCHC 32.7 30.0 - 36.0 g/dL   RDW 13.2 11.5 - 15.5 %   Platelets 259 150 - 400 K/uL   nRBC 0.0 0.0 - 0.2 %   Neutrophils Relative % 74 %   Neutro Abs 6.5 1.7 - 7.7 K/uL   Lymphocytes Relative 19 %   Lymphs Abs 1.7 0.7 - 4.0 K/uL   Monocytes Relative 5 %   Monocytes Absolute 0.4 0.1 - 1.0 K/uL   Eosinophils Relative 1 %   Eosinophils Absolute 0.1 0.0 - 0.5 K/uL   Basophils Relative 1 %   Basophils Absolute 0.1 0.0 - 0.1 K/uL   Immature Granulocytes 0 %   Abs Immature Granulocytes 0.03 0.00 - 0.07 K/uL  Basic metabolic panel     Status: Abnormal    Collection Time: 10/02/19  5:28 PM  Result Value Ref Range   Sodium 138 135 - 145 mmol/L   Potassium 6.3 (HH) 3.5 - 5.1 mmol/L   Chloride 105 98 - 111 mmol/L   CO2 22 22 - 32 mmol/L   Glucose, Bld 117 (H) 70 - 99 mg/dL   BUN 50 (H) 8 - 23 mg/dL   Creatinine, Ser 1.87 (H) 0.61 - 1.24 mg/dL   Calcium 10.0 8.9 - 10.3 mg/dL   GFR calc non Af Amer 36 (L) >60 mL/min   GFR calc Af Amer 42 (L) >60 mL/min   Anion gap 11 5 - 15   ____________________________________________ ____________________________________________  RADIOLOGY  EMERGENCY DEPARTMENT ULTRASOUND  Study: Limited Ultrasound of Bladder  INDICATIONS: to assess for urinary retention and/or bladder volume prior to urinary catheter Multiple views of the bladder were obtained in real-time in the transverse and longitudinal planes with a multi-frequency probe.  PERFORMED BY: Myself IMAGES ARCHIVED?: No LIMITATIONS:  none INTERPRETATION: foley bulb in the bladder with no bladder distension.  No free fluid    ED ECG REPORT I, Merlyn Lot, the attending physician, personally viewed and interpreted this ECG.   Date: 10/02/2019  EKG Time: 18:27  Rate: 80  Rhythm: sinus  Axis: normal  Intervals:normal  ST&T Change: no stemi, no peaked t waves    ____________________________________________   PROCEDURES  Procedure(s) performed:  Procedures    Critical Care performed: no ____________________________________________   INITIAL IMPRESSION / ASSESSMENT AND PLAN / ED COURSE  Pertinent labs & imaging results that were available during my care of the patient were reviewed by me and considered in my medical decision making (see chart for details).   DDX: Urinary retention, cystitis, prostatitis, BPH  Nicholas Nolan is a 68 y.o. who presents to the ED with acute urinary retention as described above.  Uncertain as to what the inciting factor was.  No fevers.  No evidence of UTI.  Blood work is reassuring.  Symptoms  significantly improved after placement of Foley catheter.  Abdominal exam  is benign.  Given concern for distention injury will leave Foley catheter in place and discharged with leg bag for outpatient follow-up.  Have discussed with the patient and available family all diagnostics and treatments performed thus far and all questions were answered to the best of my ability. The patient demonstrates understanding and agreement with plan.   Clinical Course as of Oct 01 1910  Tue Oct 02, 2019  1811 Platelets: 259 [PR]  [place] Blood work does show evidence of increased BUN as well as creatinine with evidence of acute hyperkalemia.  In the setting of his presentation concerning for postobstructive uropathy with hyperkalemia will temporize with IV medication placed on cardiac monitor and patient will likely require hospitalization for telemetry monitoring and medical management.   [PR]    Clinical Course User Index [PR] Merlyn Lot, MD    The patient was evaluated in Emergency Department today for the symptoms described in the history of present illness. He/she was evaluated in the context of the global COVID-19 pandemic, which necessitated consideration that the patient might be at risk for infection with the SARS-CoV-2 virus that causes COVID-19. Institutional protocols and algorithms that pertain to the evaluation of patients at risk for COVID-19 are in a state of rapid change based on information released by regulatory bodies including the CDC and federal and state organizations. These policies and algorithms were followed during the patient's care in the ED.  As part of my medical decision making, I reviewed the following data within the [place] notes reviewed and incorporated, Labs reviewed, notes from prior ED visits and  Controlled Substance Database   ____________________________________________   FINAL CLINICAL IMPRESSION(S) / ED DIAGNOSES  Final diagnoses:   Acute urinary retention  Acute hyperkalemia      NEW MEDICATIONS STARTED DURING THIS VISIT:  New Prescriptions   TAMSULOSIN (FLOMAX) 0.4 MG CAPS CAPSULE    Take 1 capsule (0.4 mg total) by mouth daily after supper.     Note:  This document was prepared using Dragon voice recognition software and may include unintentional dictation errors.    Merlyn Lot, MD 10/02/19 Donna Bernard, MD 10/02/19 1911

## 2019-10-02 NOTE — ED Triage Notes (Signed)
Patient c/o urinary retention.  STates unable to void since last night at around 2200.  STates only able to pass small amount of urine, a dribble.  C/O lower abdominal pain.

## 2019-10-03 ENCOUNTER — Other Ambulatory Visit: Payer: Self-pay

## 2019-10-03 DIAGNOSIS — R972 Elevated prostate specific antigen [PSA]: Secondary | ICD-10-CM

## 2019-10-03 DIAGNOSIS — R338 Other retention of urine: Secondary | ICD-10-CM | POA: Diagnosis not present

## 2019-10-03 DIAGNOSIS — N401 Enlarged prostate with lower urinary tract symptoms: Secondary | ICD-10-CM

## 2019-10-03 LAB — BASIC METABOLIC PANEL
Anion gap: 10 (ref 5–15)
BUN: 47 mg/dL — ABNORMAL HIGH (ref 8–23)
CO2: 22 mmol/L (ref 22–32)
Calcium: 8.9 mg/dL (ref 8.9–10.3)
Chloride: 107 mmol/L (ref 98–111)
Creatinine, Ser: 1.69 mg/dL — ABNORMAL HIGH (ref 0.61–1.24)
GFR calc Af Amer: 47 mL/min — ABNORMAL LOW (ref >60)
GFR calc non Af Amer: 41 mL/min — ABNORMAL LOW (ref >60)
Glucose, Bld: 126 mg/dL — ABNORMAL HIGH (ref 70–99)
Potassium: 4.3 mmol/L (ref 3.5–5.1)
Sodium: 139 mmol/L (ref 135–145)

## 2019-10-03 LAB — GLUCOSE, CAPILLARY
Glucose-Capillary: 138 mg/dL — ABNORMAL HIGH (ref 70–99)
Glucose-Capillary: 125 mg/dL — ABNORMAL HIGH (ref 70–99)

## 2019-10-03 LAB — CBC
HCT: 31.7 % — ABNORMAL LOW (ref 39.0–52.0)
Hemoglobin: 10.5 g/dL — ABNORMAL LOW (ref 13.0–17.0)
MCH: 29.7 pg (ref 26.0–34.0)
MCHC: 33.1 g/dL (ref 30.0–36.0)
MCV: 89.8 fL (ref 80.0–100.0)
Platelets: 231 10*3/uL (ref 150–400)
RBC: 3.53 MIL/uL — ABNORMAL LOW (ref 4.22–5.81)
RDW: 13.3 % (ref 11.5–15.5)
WBC: 9.3 10*3/uL (ref 4.0–10.5)
nRBC: 0 % (ref 0.0–0.2)

## 2019-10-03 LAB — HIV ANTIBODY (ROUTINE TESTING W REFLEX): HIV Screen 4th Generation wRfx: NONREACTIVE

## 2019-10-03 LAB — SARS CORONAVIRUS 2 (TAT 6-24 HRS): SARS Coronavirus 2: NEGATIVE

## 2019-10-03 LAB — VITAMIN B12: Vitamin B-12: 346 pg/mL (ref 180–914)

## 2019-10-03 MED ORDER — TAMSULOSIN HCL 0.4 MG PO CAPS: 0.4000 mg | ORAL_CAPSULE | Freq: Every day | ORAL | 0 refills | Status: DC

## 2019-10-03 MED ORDER — VERAPAMIL HCL ER 240 MG PO TBCR
240.0000 mg | EXTENDED_RELEASE_TABLET | Freq: Every day | ORAL | Status: DC
  Filled 2019-10-03 (×2): qty 1

## 2019-10-03 MED ORDER — INSULIN ASPART 100 UNIT/ML ~~LOC~~ SOLN: 0.0000 [IU] | Freq: Three times a day (TID) | SUBCUTANEOUS | Status: DC

## 2019-10-03 MED ORDER — CLONIDINE HCL 0.1 MG PO TABS
0.1000 mg | ORAL_TABLET | Freq: Every day | ORAL | Status: DC
  Administered 2019-10-03: 0.1 mg via ORAL
  Filled 2019-10-03: qty 1

## 2019-10-03 MED ORDER — AMLODIPINE BESYLATE 5 MG PO TABS
10.0000 mg | ORAL_TABLET | Freq: Every day | ORAL | Status: DC
  Administered 2019-10-03: 10 mg via ORAL
  Filled 2019-10-03: qty 2

## 2019-10-03 MED ORDER — CHLORHEXIDINE GLUCONATE CLOTH 2 % EX PADS
6.0000 | MEDICATED_PAD | Freq: Every day | CUTANEOUS | Status: DC
  Administered 2019-10-03: 6 via TOPICAL

## 2019-10-03 MED ORDER — INSULIN ASPART 100 UNIT/ML ~~LOC~~ SOLN: 0.0000 [IU] | Freq: Every day | SUBCUTANEOUS | Status: DC

## 2019-10-03 NOTE — ED Notes (Signed)
Pt ambulated to restroom with steady gait.

## 2019-10-03 NOTE — Plan of Care (Signed)
  Problem: Education: Goal: Knowledge of General Education information will improve Description: Including pain rating scale, medication(s)/side effects and non-pharmacologic comfort measures Outcome: Progressing Note: Patient admitted about 0300. No complaints of pain. Soreness at entry site of foley catheter. Patient is circumcised. Patient profile completed. Patient is currently resting.

## 2019-10-03 NOTE — Care Management Obs Status (Signed)
MEDICARE OBSERVATION STATUS NOTIFICATION   Patient Details  Name: Nicholas Nolan MRN: [id number] Date of Birth: [birth year]   Medicare Observation Status Notification Given:  Yes    Candie Chroman, LCSW 10/03/2019, 3:02 PM

## 2019-10-03 NOTE — Discharge Summary (Signed)
[name]:[id number] DOB: 1951/10/09 DOA: 10/02/2019  PCP: Mar Daring, PA-C  Admit date: 10/02/2019 Discharge date: 10/03/2019  Admitted From: home Disposition:  home  Recommendations for Outpatient Follow-up:  1. Follow up with PCP in 1 -3 days 2. Please obtain BMP/CBC in one week 3. Please follow up on the following pending results:  Home Health:none    Discharge Condition:Stable CODE STATUS:full  Diet recommendation: Diabetic Carb Modified   Brief/Interim Summary: Nicholas Nolan a 68 y.o.malewith medical history significant oflung cancer status post right lobectomy, hypertension, type 2 diabetes, BPH, hypothyroidism, and hyperlipidemia who presents with concerns of urinary retention. Patient reports that he chronically has trouble with starting urine streamsin the morning but would get better as the day progress. However today he was making very little urine,dribbling and had urgency along with lower abdominal pain around his bladder. No dysuria. He has never had a history of urinary retention. He used to follow with urology outpatient but stopped going for some time after his doctor left. He was afebrile and hypertensive in the Ed. Foley was placed and reportedly had over 1L of U.O. Also found with hyperkalemia and was given bicarb, Lokelma, amp of D50 and insulin .  His hyperkalemia resolved with a K of 4.3 today.  Urology was consulted they saw the patient and they felt patient was stable to be discharged on tamsulosin and Foley.  He will follow-up with urology next week for Foley to be discontinued.  Patient's wife is also a Marine scientist and she will be taking care of this.  His creatinine appears to be at baseline.  He will need to follow-up with his primary care for a BMP.  His chlorthalidone and ARB was held due to acute on chronic renal insufficiency and hyperkalemia.  He will need to follow-up with PCP about blood pressure medication changes.   Discharge  Diagnoses:  Principal Problem:   Acute urinary retention Active Problems:   Diabetes ([place])   Resistant hypertension   Acute hyperkalemia   Anemia   AKI (acute kidney injury) [facility])    Discharge Instructions  Discharge Instructions    Call MD for:  temperature >100.4   Complete by: As directed    Diet - low sodium heart healthy   Complete by: As directed    Discharge instructions   Complete by: As directed    Follow up with urology as scheduled and foley will be discontinued then. F/u with pcp in 1-3 days for blood pressure medications, needs bmp   Increase activity slowly   Complete by: As directed      Allergies as of 10/03/2019      Reactions   Codeine Itching   And hyper also   Hydromorphone Itching   Morphine And Related Itching      Medication List    STOP taking these medications   chlorthalidone 25 MG tablet Commonly known as: HYGROTON   olmesartan 40 MG tablet Commonly known as: BENICAR     TAKE these medications   amLODipine 10 MG tablet Commonly known as: NORVASC Take 1 tablet (10 mg total) by mouth daily.   cloNIDine 0.1 MG tablet Commonly known as: CATAPRES Take 1 tablet (0.1 mg total) by mouth daily.   Farxiga 10 MG Tabs tablet Generic drug: dapagliflozin propanediol Take 10 mg by mouth every morning.   glimepiride 4 MG tablet Commonly known as: AMARYL Take 0.5 tablets (2 mg total) by mouth daily.   metFORMIN 1000 MG tablet Commonly  known as: GLUCOPHAGE Take 1 tablet (1,000 mg total) by mouth daily with breakfast.   simvastatin 20 MG tablet Commonly known as: ZOCOR TAKE 1 TABLET BY MOUTH EVERY DAY IN THE EVENING   tamsulosin 0.4 MG Caps capsule Commonly known as: FLOMAX Take 1 capsule (0.4 mg total) by mouth daily after supper.   Trulicity 1.5 UQ/3.3HL Sopn Generic drug: Dulaglutide Inject 1.5 mg into the skin once a week.   verapamil 240 MG CR tablet Commonly known as: CALAN-SR Take 1 tablet (240 mg total) by mouth at  bedtime.      Follow-up Information    Mar Daring, PA-C.   Specialty: Family Medicine Contact information: [place] [address] [phone]        Abbie Sons, MD.   Specialty: Urology Contact information: [address] [place] [address] [phone]          Allergies  Allergen Reactions  . Codeine Itching    And hyper also  . Hydromorphone Itching  . Morphine And Related Itching    Consultations:  Urology   Procedures/Studies:  No results found.    Subjective: Please see progress note from today  Discharge Exam: Vitals:   10/03/19 0741 10/03/19 1552  BP: (!) 159/63 (!) 156/61  Pulse: 79 77  Resp: 19 19  Temp: 98.7 F (37.1 C) 98.2 F (36.8 C)  SpO2: 97% 99%   Vitals:   10/03/19 0304 10/03/19 0741 10/03/19 0746 10/03/19 1552  BP: (!) 161/52 (!) 159/63  (!) 156/61  Pulse: 76 79  77  Resp: 14 19  19   Temp: 98.9 F (37.2 C) 98.7 F (37.1 C)  98.2 F (36.8 C)  TempSrc: Oral Oral  Oral  SpO2: 99% 97%  99%  Weight:   93.8 kg   Height:   5\' 10"  (1.778 m)     General: Pt is alert, awake, not in acute distress Cardiovascular: RRR, S1/S2 +, no rubs, no gallops Respiratory: CTA bilaterally, no wheezing, no rhonchi Abdominal: Soft, NT, ND, bowel sounds + GU: foley placed with yellow urine Extremities: no edema, no cyanosis    The results of significant diagnostics from this hospitalization (including imaging, microbiology, ancillary and laboratory) are listed below for reference.     Microbiology: Recent Results (from the past 240 hour(s))  SARS CORONAVIRUS 2 (TAT 6-24 HRS) Nasopharyngeal Nasopharyngeal Swab     Status: None   Collection Time: 10/02/19  7:30 PM   Specimen: Nasopharyngeal Swab  Result Value Ref Range Status   SARS Coronavirus 2 NEGATIVE NEGATIVE Final    Comment: (NOTE) SARS-CoV-2 target nucleic acids are NOT DETECTED. The SARS-CoV-2 RNA is generally  detectable in upper and lower respiratory specimens during the acute phase of infection. Negative results do not preclude SARS-CoV-2 infection, do not rule out co-infections with other pathogens, and should not be used as the sole basis for treatment or other patient management decisions. Negative results must be combined with clinical observations, patient history, and epidemiological information. The expected result is Negative. Fact Sheet for Patients: SugarRoll.be Fact Sheet for Healthcare Providers: https://www.woods-mathews.com/ This test is not yet approved or cleared by the [place] FDA and  has been authorized for detection and/or diagnosis of SARS-CoV-2 by FDA under an Emergency Use Authorization (EUA). This EUA will remain  in effect (meaning this test can be used) for the duration of the COVID-19 declaration under Section 56 4(b)(1) of the Act, 21 U.S.C. section 360bbb-3(b)(1),  unless the authorization is terminated or revoked sooner. Performed at [facility], [place] [address], [place], [place] [address]      Labs: BNP (last 3 results) No results for input(s): BNP in the last 8760 hours. Basic Metabolic Panel: Recent Labs  Lab 10/02/19 1728 10/02/19 1955 10/03/19 0444  NA 138 139 139  K 6.3* 4.2 4.3  CL 105 108 107  CO2 22 22 22   GLUCOSE 117* 133* 126*  BUN 50* 51* 47*  CREATININE 1.87* 1.65* 1.69*  CALCIUM 10.0 9.0 8.9   Liver Function Tests: No results for input(s): AST, ALT, ALKPHOS, BILITOT, PROT, ALBUMIN in the last 168 hours. No results for input(s): LIPASE, AMYLASE in the last 168 hours. No results for input(s): AMMONIA in the last 168 hours. CBC: Recent Labs  Lab 10/02/19 1728 10/03/19 0444  WBC 8.8 9.3  NEUTROABS 6.5  --   HGB 11.8* 10.5*  HCT 36.1* 31.7*  MCV 92.1 89.8  PLT 259 231   Cardiac Enzymes: No results for input(s): CKTOTAL, CKMB, CKMBINDEX, TROPONINI in the last 168  hours. BNP: Invalid input(s): POCBNP CBG: Recent Labs  Lab 10/03/19 0305  GLUCAP 138*   D-Dimer No results for input(s): DDIMER in the last 72 hours. Hgb A1c No results for input(s): HGBA1C in the last 72 hours. Lipid Profile No results for input(s): CHOL, HDL, LDLCALC, TRIG, CHOLHDL, LDLDIRECT in the last 72 hours. Thyroid function studies No results for input(s): TSH, T4TOTAL, T3FREE, THYROIDAB in the last 72 hours.  Invalid input(s): FREET3 Anemia work up Recent Labs    10/02/19 1955  VITAMINB12 346  FOLATE 8.9  FERRITIN 25  TIBC 402  IRON 64   Urinalysis    Component Value Date/Time   COLORURINE STRAW (A) 10/02/2019 1500   APPEARANCEUR CLEAR (A) 10/02/2019 1500   LABSPEC 1.011 10/02/2019 1500   PHURINE 5.0 10/02/2019 1500   GLUCOSEU >=500 (A) 10/02/2019 1500   HGBUR NEGATIVE 10/02/2019 1500   BILIRUBINUR NEGATIVE 10/02/2019 1500   BILIRUBINUR neg 11/29/2016 1242   KETONESUR NEGATIVE 10/02/2019 1500   PROTEINUR 100 (A) 10/02/2019 1500   UROBILINOGEN 0.2 11/29/2016 1242   NITRITE NEGATIVE 10/02/2019 1500   LEUKOCYTESUR NEGATIVE 10/02/2019 1500   Sepsis Labs Invalid input(s): PROCALCITONIN,  WBC,  LACTICIDVEN Microbiology Recent Results (from the past 240 hour(s))  SARS CORONAVIRUS 2 (TAT 6-24 HRS) Nasopharyngeal Nasopharyngeal Swab     Status: None   Collection Time: 10/02/19  7:30 PM   Specimen: Nasopharyngeal Swab  Result Value Ref Range Status   SARS Coronavirus 2 NEGATIVE NEGATIVE Final    Comment: (NOTE) SARS-CoV-2 target nucleic acids are NOT DETECTED. The SARS-CoV-2 RNA is generally detectable in upper and lower respiratory specimens during the acute phase of infection. Negative results do not preclude SARS-CoV-2 infection, do not rule out co-infections with other pathogens, and should not be used as the sole basis for treatment or other patient management decisions. Negative results must be combined with clinical observations, patient history,  and epidemiological information. The expected result is Negative. Fact Sheet for Patients: SugarRoll.be Fact Sheet for Healthcare Providers: https://www.woods-mathews.com/ This test is not yet approved or cleared by the [place] FDA and  has been authorized for detection and/or diagnosis of SARS-CoV-2 by FDA under an Emergency Use Authorization (EUA). This EUA will remain  in effect (meaning this test can be used) for the duration of the COVID-19 declaration under Section 56 4(b)(1) of the Act, 21 U.S.C. section 360bbb-3(b)(1), unless the authorization is  terminated or revoked sooner. Performed at [facility], [place] [address], [place], [place] [address]      Time coordinating discharge: Over 30 minutes  SIGNED:   Nolberto Hanlon, MD  [facility] 10/03/2019, 4:46 PM Pager   If 7PM-7AM, please contact night-coverage [email or web address] Password [email or web address]

## 2019-10-03 NOTE — Consult Note (Signed)
Consultation: Urinary retention, BPH, elevated PSA  History of Present Illness: Nicholas Nolan is a 69 year old male who had difficulty voiding over the past few days.  A Foley catheter was placed and drained about a liter of urine.  His serum creatinine was 1.87 and usually runs between 1.6 and 1.7.  His urinalysis was negative.  An ultrasound was done July 2020 which showed no hydronephrosis but possible right renal artery stenosis. He was on tamsulosin and finasteride at one point he recalls but has been off of both of them.   He saw Dr. Bernardo Heater at [facility].  He has a history of BPH with about an 80 g prostate noted on CT scan in 2016, 104 g on prostate MRI at [facility] on 06/18/2016, and 82 g on TRUS prostate on 08/20/2016.   He has a history of PSA elevation and a PI-RADS 3 lesion in the transition zone on prostate MRI on 06/18/2016. He underwent prostate FUSION biopsy on 08/20/2016 which was benign. Pt also recalls another negative biopsy for two prior biopsies.   Past Medical History:  Diagnosis Date  . Back pain    leg pain  . Diabetes mellitus    type 2  . Hyperlipidemia   . Hypertension   . Neuropathy    feet   Past Surgical History:  Procedure Laterality Date  . BASAL CELL CARCINOMA EXCISION    . LUMBAR LAMINECTOMY/DECOMPRESSION MICRODISCECTOMY  01/10/2012   Procedure: LUMBAR LAMINECTOMY/DECOMPRESSION MICRODISCECTOMY 2 LEVELS;  Surgeon: Hosie Spangle, MD;  Location: [facility];  Service: Neurosurgery;  Laterality: Bilateral;  Lumbar four-Sacral One laminectomies  . LUNG SURGERY Right 11/22/12   right upper lobe  . PROSTATE SURGERY     biopsy-due to elelvated PSA  . RENAL ANGIOGRAPHY Right 07/02/2019   Procedure: RENAL ANGIOGRAPHY;  Surgeon: Algernon Huxley, MD;  Location: [facility];  Service: Cardiovascular;  Laterality: Right;  . SHOULDER SURGERY    . VASECTOMY      Home Medications:  Medications Prior to Admission  Medication Sig Dispense Refill Last Dose  . amLODipine  (NORVASC) 10 MG tablet Take 1 tablet (10 mg total) by mouth daily. 90 tablet 1 10/02/2019 at 0830  . cloNIDine (CATAPRES) 0.1 MG tablet Take 1 tablet (0.1 mg total) by mouth daily. 90 tablet 1 10/02/2019 at 0830  . Dulaglutide (TRULICITY) 1.5 IR/5.1OA SOPN Inject 1.5 mg into the skin once a week.    Past Week at Unknown time  . FARXIGA 10 MG TABS tablet Take 10 mg by mouth every morning.   10/02/2019 at 0830  . glimepiride (AMARYL) 4 MG tablet Take 0.5 tablets (2 mg total) by mouth daily. 90 tablet 1 10/02/2019 at 0830  . metFORMIN (GLUCOPHAGE) 1000 MG tablet Take 1 tablet (1,000 mg total) by mouth daily with breakfast. 90 tablet 1 10/02/2019 at 0830  . olmesartan (BENICAR) 40 MG tablet Take 1 tablet (40 mg total) by mouth daily. 90 tablet 1 10/02/2019 at 0830  . verapamil (CALAN-SR) 240 MG CR tablet Take 1 tablet (240 mg total) by mouth at bedtime. 90 tablet 1 10/01/2019 at 2030  . chlorthalidone (HYGROTON) 25 MG tablet Take 1 tablet (25 mg total) by mouth daily. 90 tablet 0   . simvastatin (ZOCOR) 20 MG tablet TAKE 1 TABLET BY MOUTH EVERY DAY IN THE EVENING   Not Taking at Unknown time   Allergies:  Allergies  Allergen Reactions  . Codeine Itching    And hyper also  . Hydromorphone  Itching  . Morphine And Related Itching    Family History  Problem Relation Age of Onset  . Cancer Father        prostate  . Heart disease Father        MI  . Stroke Father   . Cancer Brother        liver   Social History:  reports that he has quit smoking. His smoking use included cigarettes. He has a 90.00 pack-year smoking history. His smokeless tobacco use includes snuff and chew. He reports that he does not drink alcohol or use drugs.  ROS: A complete review of systems was performed.  All systems are negative except for pertinent findings as noted. Review of Systems  All other systems reviewed and are negative.    Physical Exam:  Vital signs in last 24 hours: Temp:  [98.7 F (37.1 C)-98.9  F (37.2 C)] 98.7 F (37.1 C) (11/25 0741) Pulse Rate:  [75-79] 79 (11/25 0741) Resp:  [14-19] 19 (11/25 0741) BP: (159-163)/(52-63) 159/63 (11/25 0741) SpO2:  [97 %-100 %] 97 % (11/25 0741) Weight:  [93.8 kg] 93.8 kg (11/25 0746) General:  Alert and oriented, No acute distress HEENT: Normocephalic, atraumatic Cardiovascular: Regular rate and rhythm Lungs: Regular rate and effort Abdomen: Soft, nontender, nondistended, no abdominal masses Back: No CVA tenderness Extremities: No edema Neurologic: Grossly intact GU: foley in place, urine clear   Laboratory Data:  Results for orders placed or performed during the hospital encounter of 10/02/19 (from the past 24 hour(s))  CBC with Differential     Status: Abnormal   Collection Time: 10/02/19  5:28 PM  Result Value Ref Range   WBC 8.8 4.0 - 10.5 K/uL   RBC 3.92 (L) 4.22 - 5.81 MIL/uL   Hemoglobin 11.8 (L) 13.0 - 17.0 g/dL   HCT 36.1 (L) 39.0 - 52.0 %   MCV 92.1 80.0 - 100.0 fL   MCH 30.1 26.0 - 34.0 pg   MCHC 32.7 30.0 - 36.0 g/dL   RDW 13.2 11.5 - 15.5 %   Platelets 259 150 - 400 K/uL   nRBC 0.0 0.0 - 0.2 %   Neutrophils Relative % 74 %   Neutro Abs 6.5 1.7 - 7.7 K/uL   Lymphocytes Relative 19 %   Lymphs Abs 1.7 0.7 - 4.0 K/uL   Monocytes Relative 5 %   Monocytes Absolute 0.4 0.1 - 1.0 K/uL   Eosinophils Relative 1 %   Eosinophils Absolute 0.1 0.0 - 0.5 K/uL   Basophils Relative 1 %   Basophils Absolute 0.1 0.0 - 0.1 K/uL   Immature Granulocytes 0 %   Abs Immature Granulocytes 0.03 0.00 - 0.07 K/uL  Basic metabolic panel     Status: Abnormal   Collection Time: 10/02/19  5:28 PM  Result Value Ref Range   Sodium 138 135 - 145 mmol/L   Potassium 6.3 (HH) 3.5 - 5.1 mmol/L   Chloride 105 98 - 111 mmol/L   CO2 22 22 - 32 mmol/L   Glucose, Bld 117 (H) 70 - 99 mg/dL   BUN 50 (H) 8 - 23 mg/dL   Creatinine, Ser 1.87 (H) 0.61 - 1.24 mg/dL   Calcium 10.0 8.9 - 10.3 mg/dL   GFR calc non Af Amer 36 (L) >60 mL/min   GFR calc Af  Amer 42 (L) >60 mL/min   Anion gap 11 5 - 15  SARS CORONAVIRUS 2 (TAT 6-24 HRS) Nasopharyngeal Nasopharyngeal Swab     Status: None  Collection Time: 10/02/19  7:30 PM   Specimen: Nasopharyngeal Swab  Result Value Ref Range   SARS Coronavirus 2 NEGATIVE NEGATIVE  Basic metabolic panel     Status: Abnormal   Collection Time: 10/02/19  7:55 PM  Result Value Ref Range   Sodium 139 135 - 145 mmol/L   Potassium 4.2 3.5 - 5.1 mmol/L   Chloride 108 98 - 111 mmol/L   CO2 22 22 - 32 mmol/L   Glucose, Bld 133 (H) 70 - 99 mg/dL   BUN 51 (H) 8 - 23 mg/dL   Creatinine, Ser 1.65 (H) 0.61 - 1.24 mg/dL   Calcium 9.0 8.9 - 10.3 mg/dL   GFR calc non Af Amer 42 (L) >60 mL/min   GFR calc Af Amer 49 (L) >60 mL/min   Anion gap 9 5 - 15  Iron and TIBC     Status: Abnormal   Collection Time: 10/02/19  7:55 PM  Result Value Ref Range   Iron 64 45 - 182 ug/dL   TIBC 402 250 - 450 ug/dL   Saturation Ratios 16 (L) 17.9 - 39.5 %   UIBC 338 ug/dL  Ferritin     Status: None   Collection Time: 10/02/19  7:55 PM  Result Value Ref Range   Ferritin 25 24 - 336 ng/mL  Vitamin B12     Status: None   Collection Time: 10/02/19  7:55 PM  Result Value Ref Range   Vitamin B-12 346 180 - 914 pg/mL  Folate     Status: None   Collection Time: 10/02/19  7:55 PM  Result Value Ref Range   Folate 8.9 >5.9 ng/mL  Glucose, capillary     Status: Abnormal   Collection Time: 10/03/19  3:05 AM  Result Value Ref Range   Glucose-Capillary 138 (H) 70 - 99 mg/dL  Basic metabolic panel     Status: Abnormal   Collection Time: 10/03/19  4:44 AM  Result Value Ref Range   Sodium 139 135 - 145 mmol/L   Potassium 4.3 3.5 - 5.1 mmol/L   Chloride 107 98 - 111 mmol/L   CO2 22 22 - 32 mmol/L   Glucose, Bld 126 (H) 70 - 99 mg/dL   BUN 47 (H) 8 - 23 mg/dL   Creatinine, Ser 1.69 (H) 0.61 - 1.24 mg/dL   Calcium 8.9 8.9 - 10.3 mg/dL   GFR calc non Af Amer 41 (L) >60 mL/min   GFR calc Af Amer 47 (L) >60 mL/min   Anion gap 10 5 - 15   CBC     Status: Abnormal   Collection Time: 10/03/19  4:44 AM  Result Value Ref Range   WBC 9.3 4.0 - 10.5 K/uL   RBC 3.53 (L) 4.22 - 5.81 MIL/uL   Hemoglobin 10.5 (L) 13.0 - 17.0 g/dL   HCT 31.7 (L) 39.0 - 52.0 %   MCV 89.8 80.0 - 100.0 fL   MCH 29.7 26.0 - 34.0 pg   MCHC 33.1 30.0 - 36.0 g/dL   RDW 13.3 11.5 - 15.5 %   Platelets 231 150 - 400 K/uL   nRBC 0.0 0.0 - 0.2 %  HIV Antibody (routine testing w rflx)     Status: None   Collection Time: 10/03/19  4:44 AM  Result Value Ref Range   HIV Screen 4th Generation wRfx NON REACTIVE NON REACTIVE   Recent Results (from the past 240 hour(s))  SARS CORONAVIRUS 2 (TAT 6-24 HRS) Nasopharyngeal Nasopharyngeal Swab  Status: None   Collection Time: 10/02/19  7:30 PM   Specimen: Nasopharyngeal Swab  Result Value Ref Range Status   SARS Coronavirus 2 NEGATIVE NEGATIVE Final    Comment: (NOTE) SARS-CoV-2 target nucleic acids are NOT DETECTED. The SARS-CoV-2 RNA is generally detectable in upper and lower respiratory specimens during the acute phase of infection. Negative results do not preclude SARS-CoV-2 infection, do not rule out co-infections with other pathogens, and should not be used as the sole basis for treatment or other patient management decisions. Negative results must be combined with clinical observations, patient history, and epidemiological information. The expected result is Negative. Fact Sheet for Patients: SugarRoll.be Fact Sheet for Healthcare Providers: https://www.woods-mathews.com/ This test is not yet approved or cleared by the [place] FDA and  has been authorized for detection and/or diagnosis of SARS-CoV-2 by FDA under an Emergency Use Authorization (EUA). This EUA will remain  in effect (meaning this test can be used) for the duration of the COVID-19 declaration under Section 56 4(b)(1) of the Act, 21 U.S.C. section 360bbb-3(b)(1), unless the authorization  is terminated or revoked sooner. Performed at [facility], [place] [address], [place], [place] [address]    Creatinine: Recent Labs    10/02/19 1728 10/02/19 1955 10/03/19 0444  CREATININE 1.87* 1.65* 1.69*    Impression/Assessment/plan:  1) urinary retention - continue foley - follow-up next week for void trial - f/u note sent to office . Wife a nurse and might d/c foley one morning next week - that's OK.   2) BPH - he needs to be on tamsulosin. Discussed with patient.   3) PSA elevation - will need f/u PSA down the road. Would not repeat now with recent foley and retention  - may be falsely elevated.   Festus Aloe 10/03/2019, 3:23 PM

## 2019-10-03 NOTE — Discharge Instructions (Signed)
Indwelling Urinary Catheter Care, Adult °An indwelling urinary catheter is a thin tube that is put into your bladder. The tube helps to drain pee (urine) out of your body. The tube goes in through your urethra. Your urethra is where pee comes out of your body. Your pee will come out through the catheter, then it will go into a bag (drainage bag). °Take good care of your catheter so it will work well. °How to wear your catheter and bag °Supplies needed °· Sticky tape (adhesive tape) or a leg strap. °· Alcohol wipe or soap and water (if you use tape). °· A clean towel (if you use tape). °· Large overnight bag. °· Smaller bag (leg bag). °Wearing your catheter °Attach your catheter to your leg with tape or a leg strap. °· Make sure the catheter is not pulled tight. °· If a leg strap gets wet, take it off and put on a dry strap. °· If you use tape to hold the bag on your leg: °1. Use an alcohol wipe or soap and water to wash your skin where the tape made it sticky before. °2. Use a clean towel to pat-dry that skin. °3. Use new tape to make the bag stay on your leg. °Wearing your bags °You should have been given a large overnight bag. °· You may wear the overnight bag in the day or night. °· Always have the overnight bag lower than your bladder.  Do not let the bag touch the floor. °· Before you go to sleep, put a clean plastic bag in a wastebasket. Then hang the overnight bag inside the wastebasket. °You should also have a smaller leg bag that fits under your clothes. °· Always wear the leg bag below your knee. °· Do not wear your leg bag at night. °How to care for your skin and catheter °Supplies needed °· A clean washcloth. °· Water and mild soap. °· A clean towel. °Caring for your skin and catheter ° °  ° °· Clean the skin around your catheter every day: °? Wash your hands with soap and water. °? Wet a clean washcloth in warm water and mild soap. °? Clean the skin around your urethra. °? If you are male: °? Gently  spread the folds of skin around your vagina (labia). °? With the washcloth in your other hand, wipe the inner side of your labia on each side. Wipe from front to back. °? If you are male: °? Pull back any skin that covers the end of your penis (foreskin). °? With the washcloth in your other hand, wipe your penis in small circles. Start wiping at the tip of your penis, then move away from the catheter. °? Move the foreskin back in place, if needed. °? With your free hand, hold the catheter close to where it goes into your body. °? Keep holding the catheter during cleaning so it does not get pulled out. °? With the washcloth in your other hand, clean the catheter. °? Only wipe downward on the catheter. °? Do not wipe upward toward your body. Doing this may push germs into your urethra and cause infection. °? Use a clean towel to pat-dry the catheter and the skin around it. Make sure to wipe off all soap. °? Wash your hands with soap and water. °· Shower every day. Do not take baths. °· Do not use cream, ointment, or lotion on the area where the catheter goes into your body, unless your doctor tells you   to. °· Do not use powders, sprays, or lotions on your genital area. °· Check your skin around the catheter every day for signs of infection. Check for: °? Redness, swelling, or pain. °? Fluid or blood. °? Warmth. °? Pus or a bad smell. °How to empty the bag °Supplies needed °· Rubbing alcohol. °· Gauze pad or cotton ball. °· Tape or a leg strap. °Emptying the bag °Pour the pee out of your bag when it is ?-½ full, or at least 2-3 times a day. Do this for your overnight bag and your leg bag. °1. Wash your hands with soap and water. °2. Separate (detach) the bag from your leg. °3. Hold the bag over the toilet or a clean pail. Keep the bag lower than your hips and bladder. This is so the pee (urine) does not go back into the tube. °4. Open the pour spout. It is at the bottom of the bag. °5. Empty the pee into the toilet or  pail. Do not let the pour spout touch any surface. °6. Put rubbing alcohol on a gauze pad or cotton ball. °7. Use the gauze pad or cotton ball to clean the pour spout. °8. Close the pour spout. °9. Attach the bag to your leg with tape or a leg strap. °10. Wash your hands with soap and water. °Follow instructions for cleaning the drainage bag: °· From the product maker. °· As told by your doctor. °How to change the bag °Supplies needed °· Alcohol wipes. °· A clean bag. °· Tape or a leg strap. °Changing the bag °Replace your bag when it starts to leak, smell bad, or look dirty. °1. Wash your hands with soap and water. °2. Separate the dirty bag from your leg. °3. Pinch the catheter with your fingers so that pee does not spill out. °4. Separate the catheter tube from the bag tube where these tubes connect (at the connection valve). Do not let the tubes touch any surface. °5. Clean the end of the catheter tube with an alcohol wipe. Use a different alcohol wipe to clean the end of the bag tube. °6. Connect the catheter tube to the tube of the clean bag. °7. Attach the clean bag to your leg with tape or a leg strap. Do not make the bag tight on your leg. °8. Wash your hands with soap and water. °General rules ° °· Never pull on your catheter. Never try to take it out. Doing that can hurt you. °· Always wash your hands before and after you touch your catheter or bag. Use a mild, fragrance-free soap. If you do not have soap and water, use hand sanitizer. °· Always make sure there are no twists or bends (kinks) in the catheter tube. °· Always make sure there are no leaks in the catheter or bag. °· Drink enough fluid to keep your pee pale yellow. °· Do not take baths, swim, or use a hot tub. °· If you are male, wipe from front to back after you poop (have a bowel movement). °Contact a doctor if: °· Your pee is cloudy. °· Your pee smells worse than usual. °· Your catheter gets clogged. °· Your catheter leaks. °· Your bladder  feels full. °Get help right away if: °· You have redness, swelling, or pain where the catheter goes into your body. °· You have fluid, blood, pus, or a bad smell coming from the area where the catheter goes into your body. °· Your skin feels warm where   the catheter goes into your body. °· You have a fever. °· You have pain in your: °? Belly (abdomen). °? Legs. °? Lower back. °? Bladder. °· You see blood in the catheter. °· Your pee is pink or red. °· You feel sick to your stomach (nauseous). °· You throw up (vomit). °· You have chills. °· Your pee is not draining into the bag. °· Your catheter gets pulled out. °Summary °· An indwelling urinary catheter is a thin tube that is placed into the bladder to help drain pee (urine) out of the body. °· The catheter is placed into the part of the body that drains pee from the bladder (urethra). °· Taking good care of your catheter will keep it working properly and help prevent problems. °· Always wash your hands before and after touching your catheter or bag. °· Never pull on your catheter or try to take it out. °This information is not intended to replace advice given to you by your health care provider. Make sure you discuss any questions you have with your health care provider. °Document Released: 02/19/2013 Document Revised: 02/16/2019 Document Reviewed: 06/10/2017 °Elsevier Patient Education © 2020 [employer] ° °

## 2019-10-03 NOTE — ED Notes (Signed)
Report given to [facility], RN

## 2019-10-03 NOTE — Progress Notes (Signed)
PROGRESS NOTE    Nicholas Nolan  GXQ:[id number] DOB: 16-Jun-1951 DOA: 10/02/2019 PCP: Mar Daring, PA-C    Brief Narrative:  Nicholas Nolan is a 68 y.o. male with medical history significant of lung cancer status post right lobectomy, hypertension, type 2 diabetes, BPH, hypothyroidism, and hyperlipidemia who presents with concerns of urinary retention.  Patient reports that he chronically has trouble with starting urine streams in the morning but would get better as the day progress. However today he was making very little urine, dribbling and had urgency along with lower abdominal pain around his bladder. No dysuria. He has never had a history of urinary retention. He used to follow with urology outpatient but stopped going for some time after his doctor left.  He was afebrile and hypertensive in the Ed. Foley was placed and reportedly had over 1L of U.O. Also found with hyperkalemia and was given bicarb, Lokelma, amp of D50 and insulin .    Consultants:   Urology    Procedures: foley placed  Antimicrobials:   none    Subjective: Foley in place . Pt still with mild pain at suprapubic area. Denies fever, chills, or any other sx.   Objective: Vitals:   10/03/19 0109 10/03/19 0304 10/03/19 0741 10/03/19 0746  BP: (!) 161/61 (!) 161/52 (!) 159/63   Pulse: 75 76 79   Resp: 18 14 19    Temp:  98.9 F (37.2 C) 98.7 F (37.1 C)   TempSrc:  Oral Oral   SpO2: 98% 99% 97%   Weight:    93.8 kg  Height:    5\' 10"  (1.778 m)    Intake/Output Summary (Last 24 hours) at 10/03/2019 1503 Last data filed at 10/03/2019 1229 Gross per 24 hour  Intake -  Output 3350 ml  Net -3350 ml   Filed Weights   10/02/19 1440 10/03/19 0746  Weight: 99.3 kg 93.8 kg    Examination:  General exam: Appears calm and comfortable , NAD Respiratory system: Clear to auscultation. Respiratory effort normal.  Wheeze rales rhonchi's cardiovascular system: S1 & S2 heard, RRR. No JVD, murmurs,  rubs, gallops or clicks.  Gastrointestinal system: Abdomen is nondistended, soft and nontender positive bowel sounds .  Minimal tenderness to suprapubic area GU: foley in place with yellow urine central nervous system: Alert and oriented. No focal neurological deficits. Extremities: No edema Skin: Warm and dry Psychiatry: Judgement and insight appear normal. Mood & affect appropriate.     Data Reviewed: I have personally reviewed following labs and imaging studies  CBC: Recent Labs  Lab 10/02/19 1728 10/03/19 0444  WBC 8.8 9.3  NEUTROABS 6.5  --   HGB 11.8* 10.5*  HCT 36.1* 31.7*  MCV 92.1 89.8  PLT 259 144   Basic Metabolic Panel: Recent Labs  Lab 10/02/19 1728 10/02/19 1955 10/03/19 0444  NA 138 139 139  K 6.3* 4.2 4.3  CL 105 108 107  CO2 22 22 22   GLUCOSE 117* 133* 126*  BUN 50* 51* 47*  CREATININE 1.87* 1.65* 1.69*  CALCIUM 10.0 9.0 8.9   GFR: Estimated Creatinine Clearance: 48.1 mL/min (A) (by C-G formula based on SCr of 1.69 mg/dL (H)). Liver Function Tests: No results for input(s): AST, ALT, ALKPHOS, BILITOT, PROT, ALBUMIN in the last 168 hours. No results for input(s): LIPASE, AMYLASE in the last 168 hours. No results for input(s): AMMONIA in the last 168 hours. Coagulation Profile: No results for input(s): INR, PROTIME in the last 168 hours. Cardiac Enzymes:  No results for input(s): CKTOTAL, CKMB, CKMBINDEX, TROPONINI in the last 168 hours. BNP (last 3 results) No results for input(s): PROBNP in the last 8760 hours. HbA1C: No results for input(s): HGBA1C in the last 72 hours. CBG: Recent Labs  Lab 10/03/19 0305  GLUCAP 138*   Lipid Profile: No results for input(s): CHOL, HDL, LDLCALC, TRIG, CHOLHDL, LDLDIRECT in the last 72 hours. Thyroid Function Tests: No results for input(s): TSH, T4TOTAL, FREET4, T3FREE, THYROIDAB in the last 72 hours. Anemia Panel: Recent Labs    10/02/19 1955  VITAMINB12 346  FOLATE 8.9  FERRITIN 25  TIBC 402   IRON 64   Sepsis Labs: No results for input(s): PROCALCITON, LATICACIDVEN in the last 168 hours.  Recent Results (from the past 240 hour(s))  SARS CORONAVIRUS 2 (TAT 6-24 HRS) Nasopharyngeal Nasopharyngeal Swab     Status: None   Collection Time: 10/02/19  7:30 PM   Specimen: Nasopharyngeal Swab  Result Value Ref Range Status   SARS Coronavirus 2 NEGATIVE NEGATIVE Final    Comment: (NOTE) SARS-CoV-2 target nucleic acids are NOT DETECTED. The SARS-CoV-2 RNA is generally detectable in upper and lower respiratory specimens during the acute phase of infection. Negative results do not preclude SARS-CoV-2 infection, do not rule out co-infections with other pathogens, and should not be used as the sole basis for treatment or other patient management decisions. Negative results must be combined with clinical observations, patient history, and epidemiological information. The expected result is Negative. Fact Sheet for Patients: SugarRoll.be Fact Sheet for Healthcare Providers: https://www.woods-mathews.com/ This test is not yet approved or cleared by the [place] FDA and  has been authorized for detection and/or diagnosis of SARS-CoV-2 by FDA under an Emergency Use Authorization (EUA). This EUA will remain  in effect (meaning this test can be used) for the duration of the COVID-19 declaration under Section 56 4(b)(1) of the Act, 21 U.S.C. section 360bbb-3(b)(1), unless the authorization is terminated or revoked sooner. Performed at [facility], [place] [address], [place], [place] [address]          Radiology Studies: No results found.      Scheduled Meds: . amLODipine  10 mg Oral Daily  . Chlorhexidine Gluconate Cloth  6 each Topical Daily  . cloNIDine  0.1 mg Oral Daily  . enoxaparin (LOVENOX) injection  40 mg Subcutaneous Q24H  . verapamil  240 mg Oral QHS   Continuous Infusions:  Assessment & Plan:   Principal  Problem:   Acute urinary retention Active Problems:   Diabetes ([place])   Resistant hypertension   Acute hyperkalemia   Anemia   AKI (acute kidney injury) ([place])   1.Urinary retention in the setting of BPH - continue with foley -Continue on Flomax.   Urology consulted- Pending, will /fu  2.Hyperkalemia- likely 2/2 AKI and urinary retention - 6.3 on admission - given amp of bicarb, Lokelma, amp of D50 and 5 units of insulin in the ED. - stable now. 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 Will f/u. -creatinine improving slowly with foley placement. Will f/u labs  4.Anemia - Hemoglobin of 11.8 - last normal was 3 years ago - obtain Folic, Vitamin W10 and FOBT- -continue to monitor  5.Hypertension -Continue amlodipine, clonidine, verapamil -Held chlorothiazide, olmesartan due to AKI  6.Type 2 diabetes- HbA1C of 6.9 in Oct -Hold home diabetic medication - start FS ck, riss.  DVT prophylaxis:.Lovenox Code Status: Full Family Communication: Plan discussed with patient at bedside  disposition Plan: currently under observation. Will likely be here tonight, will f/u with urology, d/c when cleared by urology.      LOS: 0 days   Time spent: 45 minutes with more than 50% on [place], MD [facility] Pager [phone]  If 7PM-7AM, please contact night-coverage [email or web address] Password [facility] 10/03/2019, 3:03 PM

## 2019-10-03 NOTE — Plan of Care (Signed)
  Problem: Education: Goal: Knowledge of General Education information will improve Description: Including pain rating scale, medication(s)/side effects and non-pharmacologic comfort measures Outcome: Progressing   Problem: Health Behavior/Discharge Planning: Goal: Ability to manage health-related needs will improve Outcome: Progressing   Problem: Clinical Measurements: Goal: Ability to maintain clinical measurements within normal limits will improve Outcome: Progressing Goal: Will remain free from infection Outcome: Progressing Note: Remains afebrile Goal: Diagnostic test results will improve Outcome: Progressing Note: BUN 47/1.69 this am, Potassium 4.3 Goal: Respiratory complications will improve Outcome: Progressing Goal: Cardiovascular complication will be avoided Outcome: Progressing   Problem: Elimination: Goal: Will not experience complications related to bowel motility Outcome: Progressing   Problem: Elimination: Goal: Will not experience complications related to urinary retention Outcome: Not Progressing Note: Still has indwelling foley

## 2019-10-03 NOTE — Progress Notes (Signed)
Went over discharge instructions with the patient including medications and follow-up appointment. Discontinue PIV and telemetry monitor.

## 2019-10-08 ENCOUNTER — Telehealth: Payer: Self-pay | Admitting: Cardiology

## 2019-10-08 ENCOUNTER — Telehealth: Payer: Self-pay | Admitting: Urology

## 2019-10-08 NOTE — Telephone Encounter (Signed)
-----   Message from Festus Aloe, MD sent at 10/03/2019  4:34 PM EST ----- Patient needs f/u next week for NV void trial or PVR (pt's wife may d/c foley, was a nurse) and to see Dr. Bernardo Heater or me about a week after that (he saw Dr. Bernardo Heater at [facility]).  Thanks!

## 2019-10-08 NOTE — Telephone Encounter (Signed)
Spoke with patient's wife, ok per DPR. Wife works at [employer] and would like the sleep study done there. The number she provided was for the sleep center. Advised her that patient needs the consult with pulmonology and then they order and manage the sleep equipment and orders. She verbalized understanding and I gave her pulmonology's number to call to schedule the referral appointment.  Patient also has appointment this Friday though his coronary CT has not been done yet. Initially, when it was attempted to be scheduled patient was being ruled out for COVID. Covid was negative. Patient ready to schedule. Message sent to [facility] and precert.

## 2019-10-08 NOTE — Telephone Encounter (Signed)
Patient spouse calling  States that patient wants pulmonary referral for [facility] - offered fax number to fax referral to  [phone] Please call with any questions or concerns

## 2019-10-08 NOTE — Telephone Encounter (Signed)
Patient's wife has already taken out his catheter and he is urinating just fine, per the patient. He wants to know if he should continue to take the Flomax? I have sent a message to Dr. Junious Silk and if so he would like it called into the [facility] in [place].   Follow up app has been made with [facility] patient was not able to come in until the end of the month.

## 2019-10-09 ENCOUNTER — Telehealth: Payer: Self-pay | Admitting: Cardiology

## 2019-10-09 ENCOUNTER — Telehealth: Payer: Self-pay | Admitting: Family Medicine

## 2019-10-09 DIAGNOSIS — I701 Atherosclerosis of renal artery: Secondary | ICD-10-CM

## 2019-10-09 MED ORDER — TAMSULOSIN HCL 0.4 MG PO CAPS: 0.4000 mg | ORAL_CAPSULE | Freq: Every day | ORAL | 1 refills | Status: DC

## 2019-10-09 NOTE — Telephone Encounter (Addendum)
Spoke with the patient and his wife. Advised the patient's wife that instructions for Metoprolol to be taken prior to the Coronary CT automatically prints out in the pre-test instructions.  The patients baseline HR is normally in the low 60's bpm. Patient also takes Verapamil 240mg  qd. Advised the patient's wife that I did not think Metoprolol is needed prior to the CT. If the patient's HR is elevated upon arrival for the CT they will administer Metoprolol at that time.  Patient was also inquiring whether lab work was needed prior to the scheduled CTA. Patients Coronary CTA is scheduled for 10/11/19. Patient did have a Bmet drawn on 10/03/19. The patients creatinine was 1.69 which seemed stable compared to his prior. Advised the patient's wife that I will fwd the message to the ordering provider Christell Faith, PA to see if he would like to repeat a bmet  possibly after the CTA (contrast) to insure creatinine stability.  I did instruct the patient to hydrate before and after the CTA  Also provided the telephone for the CT nurse navigator Marchia Bond, RN for any further pre-test questions.

## 2019-10-09 NOTE — Telephone Encounter (Signed)
Patient is having coronary CT scan and his wife is asking if her really needs to be taking Metoprolol. Please call to advise.

## 2019-10-09 NOTE — Telephone Encounter (Signed)
Renal function is stable when compared to prior studies.  Please ensure patient is adequately hydrated leading up to coronary CTA as well as continued hydration following CTA.  Okay to check BMET 1 week after coronary CTA.

## 2019-10-09 NOTE — Telephone Encounter (Signed)
Call to patient to discuss POC from Christell Faith, PA in regards to CT scan.   Pt verbalized understanding and will go [place] the medical mall 1 week following CT scan to ensure kidney function is stable.   Advised pt to call for any further questions or concerns.

## 2019-10-09 NOTE — Telephone Encounter (Signed)
-----   Message from Festus Aloe, MD sent at 10/09/2019  1:33 PM EST ----- Please send in a tamsulosin Rx to his pharmacy - thanks!  ----- Message ----- From: Benard Halsted Sent: 10/08/2019   3:14 PM EST To: Festus Aloe, MD  His wife took his catheter out already and he said he was urinating just fine. He did want to know if he should continue taking the Flomax? If so he needed a refill called into his pharmacy. I will update that in his chart to make sure it is correct.  ----- Message ----- From: Festus Aloe, MD Sent: 10/03/2019   4:34 PM EST To: Benard Halsted  Patient needs f/u next week for NV void trial or PVR (pt's wife may d/c foley, was a nurse) and to see Dr. Bernardo Heater or me about a week after that (he saw Dr. Bernardo Heater at [facility]).  Thanks!

## 2019-10-09 NOTE — Telephone Encounter (Signed)
LMOM informing patient to pick up Flomax from pharmacy he is to continue the medication.

## 2019-10-10 ENCOUNTER — Telehealth (HOSPITAL_COMMUNITY): Payer: Self-pay | Admitting: Emergency Medicine

## 2019-10-10 NOTE — Telephone Encounter (Signed)
Reaching out to patient to offer assistance regarding upcoming cardiac imaging study; pt verbalizes understanding of appt date/time, parking situation and where to check in, pre-test NPO status and medications ordered, and verified current allergies; name and call back number provided for further questions should they arise Nicholas Bond RN Navigator Cardiac Imaging [facility] [phone] office [phone] cell   Encouraged PO fluids pre-/post- CTA for kidneys. Also instructed to hold diabetic medications day of test

## 2019-10-11 ENCOUNTER — Other Ambulatory Visit: Payer: Self-pay

## 2019-10-11 ENCOUNTER — Ambulatory Visit
Admission: RE | Admit: 2019-10-11 | Discharge: 2019-10-11 | Disposition: A | Discharge status: Home / Self Care | Payer: Medicare Other | Source: Ambulatory Visit | Attending: Physician Assistant | Admitting: Physician Assistant

## 2019-10-11 DIAGNOSIS — R072 Precordial pain: Secondary | ICD-10-CM | POA: Diagnosis present

## 2019-10-11 LAB — POCT I-STAT CREATININE: Creatinine, Ser: 1.6 mg/dL — ABNORMAL HIGH (ref 0.61–1.24)

## 2019-10-11 MED ORDER — METOPROLOL TARTRATE 5 MG/5ML IV SOLN
5.0000 mg | INTRAVENOUS | Status: DC | PRN
  Administered 2019-10-11: 5 mg via INTRAVENOUS

## 2019-10-11 MED ORDER — NITROGLYCERIN 0.4 MG SL SUBL
0.8000 mg | SUBLINGUAL_TABLET | Freq: Once | SUBLINGUAL | Status: AC
  Administered 2019-10-11: 11:00:00 0.8 mg via SUBLINGUAL

## 2019-10-11 MED ORDER — IOHEXOL 350 MG/ML SOLN
85.0000 mL | Freq: Once | INTRAVENOUS | Status: AC | PRN
  Administered 2019-10-11: 85 mL via INTRAVENOUS

## 2019-10-11 NOTE — Progress Notes (Signed)
Patient tolerated CT without incident. Gave patient water and peanut butter and crackers after. Ambulatory steady gait to exit.

## 2019-10-12 ENCOUNTER — Ambulatory Visit: Payer: Medicare Other | Admitting: Cardiology

## 2019-10-12 DIAGNOSIS — I2510 Atherosclerotic heart disease of native coronary artery without angina pectoris: Secondary | ICD-10-CM | POA: Diagnosis not present

## 2019-10-15 ENCOUNTER — Telehealth: Payer: Self-pay

## 2019-10-15 MED ORDER — ATORVASTATIN CALCIUM 80 MG PO TABS: 80.0000 mg | ORAL_TABLET | Freq: Every day | ORAL | 3 refills | Status: DC

## 2019-10-15 NOTE — Telephone Encounter (Signed)
-----   Message from Rise Mu, PA-C sent at 10/12/2019  7:25 AM EST ----- Cardiac findings: -Ascending and descending aortic calcifications noted -Heavily calcified plaque noted along the LAD and RCA -Calcium score of 1689, which is 94th percentile -Study submitted for FFR  Recommendations: -Await FFR -Please get patient's appointment moved up with Dr. Garen Lah or myself so we can discuss likely cath -Most recent LDL of 100 from 11/2017 with goal LDL < 70 -Stop simvastatin -Start Lipitor 80 mg daily -He will need follow up fasting lipid panel and liver function in ~ 8 weeks -If LDL remains above goal at that time, start Zetia 10 mg daily -Please see note on non-cardiac findings as well

## 2019-10-15 NOTE — Telephone Encounter (Signed)
Call to patient to discuss CT cardiac findings.   Pt verbalized understanding and will d/c simvastatin.Start Lipitor. Appt moved this Friday with Laurine Blazer.   Still awaiting FFR results.

## 2019-10-16 NOTE — Progress Notes (Signed)
Cardiology Office Note    Date:  10/19/2019   ID:  [name], DOB [birth year], MRN [id number]  PCP:  Mar Daring, PA-C  Cardiologist:  Kate Sable, MD  Electrophysiologist:  None   Chief Complaint: Discuss coronary CTA  History of Present Illness:   Nicholas Nolan is a 68 y.o. male with history of CAD noted on recent coronary CTA, DM, CKD stage III, HLD, difficult to control HTN, lung cancer s/p partial lobectomy in 2016, prior tobacco abuse, neuropathy, and back pain who presents for follow up of coronary CTA.   He was seen by our office in consult on 07/24/2019 and noted a long history of hypertension that has been difficult to control on many drug regimens. He noted occasional chest pain when his BP was severely elevated with improvement in pain when his BP was improved. There was some dizziness and fatigue associated with starting a new BP medication several weeks prior to his appointment with [place] in 07/2019. Renal ultrasound in 05/2019 showed elevated peak systolic velocity in the mid segment of the right renal artery with follow up renal angiogram in 06/2019 showed no more than 15% stenosis on selective imaging of the bilateral renal arteries. Secondly, he reported worsening DOE over the past several months with increased exertional fatigue. BP at his visit with [place] was 150/66. His amlodipine was titrated to 10 mg. Toprol XL was stopped as there was some concern this may have been playing a role in his fatigue. He was continued on HCTZ 25 mg, Benicar 40 mg, verapamil 240 mg, clonidine 0.1 mg tid. Echo showed an EF of 60 to 65%, mild LVH, diastolic dysfunction, normal RV systolic function and cavity size, and mild mitral vegetation.  He was seen in follow up on 08/28/2019 was was feeling a little better following the discontinuation of metoprolol, though did continue to note fatigue along with exertional chest pain and dyspnea. He was scheduled for coronary CTA. However,  before this could be completed, he was admitted to the hospital in late 09/2019 with urinary retention, AKI, and hyperkalemia requiring Foley with planned follow up with Urology.  He underwent coronary CTA on 10/11/2019 which showed a calcium score of 1689, which was 94th percentile for age and sex matched control. There was mild stenosis along the proximal and distal LAD, long segment of severe calcific plaque along the mid LAD, heavily calcified plaque in the mid LCx, mild nonobstructive plaque throughout the RCA, estimated at < 25%. Study was submitted for FFR which showed no significant stenosis of the LAD or RCA. There was no significant stenosis of the proximal LCx. FFR of the distal LCx was significant at 0.65-0.75, though the vessel was small in diameter, measuring < 2 mm in diameter. In this setting, medical therapy was advised.   Patient comes in accompanied by his wife today.  He feels about the same when compared to his last visit.  He continues to note fatigue, exertional shortness of breath, and chest discomfort, particularly when his BP is elevated.  BP continues to run in the 536R to 443X systolic.  He has pulmonology consultation scheduled for later this month to discuss sleep study for sleep disordered breathing and likely sleep apnea.  He is no longer on olmesartan or chlorthalidone secondary to AKI in the setting of acute urinary retention with BPH.  He has follow-up scheduled with urology.  He is tolerating recently added Lipitor without issue.   Labs: 09/2019 - HGB 10.5,  PLT 231, potassium 4.3, BUN 47, SCr 1.69 08/2019 - A1c 6.9 11/2017 - total cholesterol 160, triglycerides 111, HDL 37, LDL 100  Past Medical History:  Diagnosis Date   Back pain    leg pain   Diabetes mellitus    type 2   Hyperlipidemia    Hypertension    Neuropathy    feet   Primary cancer of right upper lobe of lung ([place]) 2014   RUL Lobectomy    Past Surgical History:  Procedure Laterality  Date   BASAL CELL CARCINOMA EXCISION     LUMBAR LAMINECTOMY/DECOMPRESSION MICRODISCECTOMY  01/10/2012   Procedure: LUMBAR LAMINECTOMY/DECOMPRESSION MICRODISCECTOMY 2 LEVELS;  Surgeon: Hosie Spangle, MD;  Location: [facility];  Service: Neurosurgery;  Laterality: Bilateral;  Lumbar four-Sacral One laminectomies   LUNG SURGERY Right 11/22/12   right upper lobe   PROSTATE SURGERY     biopsy-due to elelvated PSA   RENAL ANGIOGRAPHY Right 07/02/2019   Procedure: RENAL ANGIOGRAPHY;  Surgeon: Algernon Huxley, MD;  Location: [facility];  Service: Cardiovascular;  Laterality: Right;   SHOULDER SURGERY     VASECTOMY      Current Medications: Current Meds  Medication Sig   amLODipine (NORVASC) 10 MG tablet Take 1 tablet (10 mg total) by mouth daily.   atorvastatin (LIPITOR) 80 MG tablet Take 1 tablet (80 mg total) by mouth daily.   cloNIDine (CATAPRES) 0.1 MG tablet Take 1 tablet (0.1 mg total) by mouth daily.   Dulaglutide (TRULICITY) 1.5 ZY/6.0YT SOPN Inject 1.5 mg into the skin once a week.    FARXIGA 10 MG TABS tablet Take 10 mg by mouth every morning.   glimepiride (AMARYL) 4 MG tablet Take 0.5 tablets (2 mg total) by mouth daily.   metFORMIN (GLUCOPHAGE) 1000 MG tablet Take 1 tablet (1,000 mg total) by mouth daily with breakfast.   tamsulosin (FLOMAX) 0.4 MG CAPS capsule Take 1 capsule (0.4 mg total) by mouth daily after supper.   verapamil (CALAN-SR) 240 MG CR tablet Take 1 tablet (240 mg total) by mouth at bedtime.    Allergies:   Codeine, Hydromorphone, and Morphine and related   Social History   Socioeconomic History   Marital status: Married    Spouse name: Not on file   Number of children: 4   Years of education: Not on file   Highest education level: Associate degree: occupational, Hotel manager, or vocational program  Occupational History   Not on file  Tobacco Use   Smoking status: Former Smoker    Packs/day: 2.00    Years: 45.00    Pack  years: 90.00    Types: Cigarettes   Smokeless tobacco: Current User    Types: Snuff, Chew   Tobacco comment: Quit in 2015  Substance and Sexual Activity   Alcohol use: No   Drug use: No   Sexual activity: Not on file  Other Topics Concern   Not on file  Social History Narrative   Not on file   Social Determinants of Health   Financial Resource Strain: Low Risk    Difficulty of Paying Living Expenses: Not hard at all  Food Insecurity: No Food Insecurity   Worried About Charity fundraiser in the Last Year: Never true   [place] in the Last Year: Never true  Transportation Needs: Unknown   Lack of Transportation (Medical): No   Lack of Transportation (Non-Medical): Not on file  Physical Activity: Inactive   Days of  Exercise per Week: 0 days   Minutes of Exercise per Session: 0 min  Stress: No Stress Concern Present   Feeling of Stress : Not at all  Social Connections: Unknown   Frequency of Communication with Friends and Family: Patient refused   Frequency of Social Gatherings with Friends and Family: Patient refused   Attends Religious Services: Patient refused   Marine scientist or Organizations: Patient refused   Attends Archivist Meetings: Patient refused   Marital Status: Patient refused     Family History:  The patient's family history includes Cancer in his brother and father; Heart disease in his father; Stroke in his father.  ROS:   Review of Systems  Constitutional: Positive for malaise/fatigue. Negative for chills, diaphoresis, fever and weight loss.  HENT: Negative for congestion.   Eyes: Negative for discharge and redness.  Respiratory: Positive for shortness of breath. Negative for cough and wheezing.   Cardiovascular: Positive for chest pain. Negative for palpitations, orthopnea, claudication, leg swelling and PND.  Gastrointestinal: Negative for abdominal pain, heartburn, nausea and vomiting.  Musculoskeletal:  Negative for falls and myalgias.  Skin: Negative for rash.  Neurological: Positive for weakness. Negative for dizziness, tingling, tremors, sensory change, speech change, focal weakness and loss of consciousness.  Psychiatric/Behavioral: Negative for substance abuse. The patient is not nervous/anxious.   All other systems reviewed and are negative.    EKGs/Labs/Other Studies Reviewed:    Studies reviewed were summarized above. The additional studies were reviewed today:  2D Echo 08/2019:  1. Left ventricular ejection fraction, by visual estimation, is 60 to 65%. The left ventricle has normal function. Normal left ventricular size. There is mildly increased left ventricular hypertrophy.  2. Left ventricular diastolic Doppler parameters are consistent with impaired relaxation pattern of LV diastolic filling.  3. Global right ventricle has normal systolic function.The right ventricular size is normal. No increase in right ventricular wall thickness.  4. Left atrial size was normal.  5. TR signal is inadequate for assessing pulmonary artery systolic pressure. __________  Coronary CTA 10/2019: IMPRESSION: 1. Coronary calcium score of 1689. This was 27 percentile for age and sex matched control. 2. Normal coronary origin with right dominance. 3. Severe calcific plaques in the mid LAD and RCA. Degree of stenosis cannot be determined due to calcium blooming artifacts.  Possible CAD-RADS 4, Severe stenosis. Cardiac catheterization or CT FFR is recommended. CT FFR will be submitted. Consider symptom-guided anti-ischemic pharmacotherapy as well as risk factor modification per guideline directed care. __________  FFR 10/2019: 1. Left Main:  No significant stenosis. 2. LAD: No significant stenosis. 3. LCX: No significant stenosis in the proximal segment. Significant stenosis in the distal segment with FFR range of 0.65 - 0.75 4. RCA: No significant stenosis.  IMPRESSION: 1. CT FFR  analysis showed significant stenosis in the distal segment of the LCX. The distal segment of the LCX is a small vessel with <55mm diameter. 2.  Guideline directed medical therapy recommended.   EKG:  EKG is ordered today.  The EKG ordered today demonstrates NSR, 60 bpm, normal axis, no acute ST-T changes  Recent Labs: 10/03/2019: BUN 47; Hemoglobin 10.5; Platelets 231; Potassium 4.3; Sodium 139 10/11/2019: Creatinine, Ser 1.60  Recent Lipid Panel    Component Value Date/Time   CHOL 204 (H) 05/13/2016 0827   TRIG 382 (H) 05/13/2016 0827   HDL 31 (L) 05/13/2016 0827   CHOLHDL 6.6 (H) 05/13/2016 0827   LDLCALC 97 05/13/2016 0827  PHYSICAL EXAM:    VS:  BP (!) 150/70 (BP Location: Left Arm, Patient Position: Sitting, Cuff Size: Normal)    Pulse 68    Temp 98.1 F (36.7 C)    Ht 5\' 10"  (1.778 m)    Wt 212 lb 8 oz (96.4 kg)    SpO2 98%    BMI 30.49 kg/m   BMI: Body mass index is 30.49 kg/m.  Physical Exam  Constitutional: He is oriented to person, place, and time. He appears well-developed and well-nourished.  HENT:  Head: Normocephalic and atraumatic.  Eyes: Right eye exhibits no discharge. Left eye exhibits no discharge.  Neck: No JVD present.  Cardiovascular: Normal rate, regular rhythm, S1 normal, S2 normal and normal heart sounds. Exam reveals no distant heart sounds, no friction rub, no midsystolic click and no opening snap.  No murmur heard. Pulses:      Posterior tibial pulses are 2+ on the right side and 2+ on the left side.  Pulmonary/Chest: Effort normal and breath sounds normal. No respiratory distress. He has no decreased breath sounds. He has no wheezes. He has no rales. He exhibits no tenderness.  Abdominal: Soft. He exhibits no distension. There is no abdominal tenderness.  Musculoskeletal:        General: No edema.     Cervical back: Normal range of motion.  Neurological: He is alert and oriented to person, place, and time.  Skin: Skin is warm and dry. No  cyanosis. Nails show no clubbing.  Psychiatric: He has a normal mood and affect. His speech is normal and behavior is normal. Judgment and thought content normal.    Wt Readings from Last 3 Encounters:  10/19/19 212 lb 8 oz (96.4 kg)  10/03/19 206 lb 11.2 oz (93.8 kg)  10/01/19 219 lb (99.3 kg)     ASSESSMENT & PLAN:   1. CAD involving the native coronary arteries with stable angina: Recent coronary CTA demonstrated nonobstructive disease within the LAD and RCA with hemodynamically significant stenosis within the distal LCx.  However, this vessel was small in size measuring less than 2 mm in diameter.  In this setting, optimization of medical therapy has been recommended.  He is doing reasonably well though does continue to note persistent fatigue, exertional shortness of breath, and chest discomfort when his BP is elevated.  Long discussion regarding nonobstructive nature of his underlying coronary disease with recommendation for optimization of medical therapy.  His stenotic lesion along the distal LCx is not amenable to percutaneous coronary intervention and is unlikely to be the culprit for his symptoms.  I continue to think a fair amount of his continued fatigue is in the setting of undiagnosed sleep apnea.  He is scheduled for pulmonology evaluation later this month to discuss sleep study.  Cannot exclude some degree of physical deconditioning as well.  To further optimize medical regimen, Imdur 30 mg daily will be added.  I have also sent in a prescription for sublingual nitroglycerin to take as needed.  He is no longer on beta-blocker secondary to fatigue.  He will continue recently started atorvastatin in place of simvastatin as well as amlodipine.  No plans for further ischemic evaluation at this time.  2. Refractory hypertension: Blood pressure is suboptimally controlled at 150/70.  No longer on olmesartan and chlorthalidone secondary to AKI secondary to urinary tension secondary to BPH.   Recommend continued follow-up with sleep study.  Add Imdur 30 mg daily.  Continue amlodipine 10 mg daily.  Transition clonidine  from 0.1 mg in the morning to 0.1 mg nightly.  Continue verapamil 240 mg daily.  No longer on beta-blocker secondary to fatigue.  3. Hyperlipidemia: LDL of 100 from 11/2017 with goal LDL now less than 70 given underlying CAD noted on coronary CTA.  He was transition from simvastatin to atorvastatin 80 mg daily after the resulting of his CTA and it seems to be tolerating this without issue.  He is fasting today.  Check lipid panel and CMP.  4. Sleep disordered breathing: Patient was referred for sleep study at last visit.  5. CKD stage III: Check CMP following coronary CTA.  Follow-up with PCP/nephrology as directed.  Disposition: F/u with Dr. Garen Lah in 1 month.   Medication Adjustments/Labs and Tests Ordered: Current medicines are reviewed at length with the patient today.  Concerns regarding medicines are outlined above. Medication changes, Labs and Tests ordered today are summarized above and listed in the Patient Instructions accessible in Encounters.   Signed, Christell Faith, PA-C 10/19/2019 9:58 AM     [facility] [address] [place] [address] [place],  [address] [phone]

## 2019-10-19 ENCOUNTER — Ambulatory Visit (INDEPENDENT_AMBULATORY_CARE_PROVIDER_SITE_OTHER): Payer: Medicare Other | Admitting: Physician Assistant

## 2019-10-19 ENCOUNTER — Encounter: Payer: Self-pay | Admitting: Physician Assistant

## 2019-10-19 ENCOUNTER — Other Ambulatory Visit: Payer: Self-pay

## 2019-10-19 VITALS — BP 150/70 | HR 68 | Temp 98.1°F | Ht 70.0 in | Wt 212.5 lb

## 2019-10-19 DIAGNOSIS — R5383 Other fatigue: Secondary | ICD-10-CM

## 2019-10-19 DIAGNOSIS — G4730 Sleep apnea, unspecified: Secondary | ICD-10-CM

## 2019-10-19 DIAGNOSIS — I25118 Atherosclerotic heart disease of native coronary artery with other forms of angina pectoris: Secondary | ICD-10-CM | POA: Diagnosis not present

## 2019-10-19 DIAGNOSIS — I10 Essential (primary) hypertension: Secondary | ICD-10-CM | POA: Diagnosis not present

## 2019-10-19 DIAGNOSIS — R0600 Dyspnea, unspecified: Secondary | ICD-10-CM | POA: Diagnosis not present

## 2019-10-19 DIAGNOSIS — E785 Hyperlipidemia, unspecified: Secondary | ICD-10-CM

## 2019-10-19 DIAGNOSIS — N183 Chronic kidney disease, stage 3 unspecified: Secondary | ICD-10-CM

## 2019-10-19 DIAGNOSIS — R0609 Other forms of dyspnea: Secondary | ICD-10-CM

## 2019-10-19 MED ORDER — CLONIDINE HCL 0.1 MG PO TABS: 0.1000 mg | ORAL_TABLET | Freq: Every day | ORAL | 1 refills | Status: DC

## 2019-10-19 MED ORDER — NITROGLYCERIN 0.4 MG SL SUBL: 0.4000 mg | SUBLINGUAL_TABLET | SUBLINGUAL | 3 refills | Status: DC | PRN

## 2019-10-19 MED ORDER — ISOSORBIDE MONONITRATE ER 30 MG PO TB24: 30.0000 mg | ORAL_TABLET | Freq: Every day | ORAL | 3 refills | Status: DC

## 2019-10-19 NOTE — Patient Instructions (Signed)
Medication Instructions:  1- START Imdur 1 tablet (30 mg total) once daily 2- Change Clonidine to take at bedtime 3- As needed Take NTG Place 1 tablet (0.4 mg total) under the tongue every 5 (five) minutes as needed for chest pain. Max 3 tablets. If chest pain is not relieved after 3 tablets seek urgent medical attention.   *If you need a refill on your cardiac medications before your next appointment, please call your pharmacy*  Lab Work: Your physician recommends that you have lab work today(Lipids, CMET)  If you have labs (blood work) drawn today and your tests are completely normal, you will receive your results only by: Marland Kitchen MyChart Message (if you have MyChart) OR . A paper copy in the mail If you have any lab test that is abnormal or we need to change your treatment, we will call you to review the results.  Testing/Procedures: None ordered  Follow-Up: At [facility], you and your health needs are our priority.  As part of our continuing mission to provide you with exceptional heart care, we have created designated Provider Care Teams.  These Care Teams include your primary Cardiologist (physician) and Advanced Practice Providers (APPs -  Physician Assistants and Nurse Practitioners) who all work together to provide you with the care you need, when you need it.  Your next appointment:   1 month(s)  The format for your next appointment:   In Person  Provider:    You may see Kate Sable, MD or Christell Faith, PA-C.

## 2019-10-20 LAB — COMPREHENSIVE METABOLIC PANEL
ALT: 10 IU/L (ref 0–44)
AST: 12 IU/L (ref 0–40)
Albumin/Globulin Ratio: 2 (ref 1.2–2.2)
Albumin: 4.3 g/dL (ref 3.8–4.8)
Alkaline Phosphatase: 111 IU/L (ref 39–117)
BUN/Creatinine Ratio: 19 (ref 10–24)
BUN: 30 mg/dL — ABNORMAL HIGH (ref 8–27)
Bilirubin Total: <0.2 mg/dL (ref 0.0–1.2)
CO2: 20 mmol/L (ref 20–29)
Calcium: 9.6 mg/dL (ref 8.6–10.2)
Chloride: 105 mmol/L (ref 96–106)
Creatinine, Ser: 1.59 mg/dL — ABNORMAL HIGH (ref 0.76–1.27)
GFR calc Af Amer: 51 mL/min/{1.73_m2} — ABNORMAL LOW (ref >59)
GFR calc non Af Amer: 44 mL/min/{1.73_m2} — ABNORMAL LOW (ref >59)
Globulin, Total: 2.2 g/dL (ref 1.5–4.5)
Glucose: 141 mg/dL — ABNORMAL HIGH (ref 65–99)
Potassium: 5.3 mmol/L — ABNORMAL HIGH (ref 3.5–5.2)
Sodium: 140 mmol/L (ref 134–144)
Total Protein: 6.5 g/dL (ref 6.0–8.5)

## 2019-10-20 LAB — LIPID PANEL
Chol/HDL Ratio: 5.1 ratio — ABNORMAL HIGH (ref 0.0–5.0)
Cholesterol, Total: 221 mg/dL — ABNORMAL HIGH (ref 100–199)
HDL: 43 mg/dL (ref >39)
LDL Chol Calc (NIH): 150 mg/dL — ABNORMAL HIGH (ref 0–99)
Triglycerides: 153 mg/dL — ABNORMAL HIGH (ref 0–149)
VLDL Cholesterol Cal: 28 mg/dL (ref 5–40)

## 2019-10-22 ENCOUNTER — Telehealth: Payer: Self-pay

## 2019-10-22 DIAGNOSIS — I25118 Atherosclerotic heart disease of native coronary artery with other forms of angina pectoris: Secondary | ICD-10-CM

## 2019-10-22 NOTE — Telephone Encounter (Signed)
-----   Message from Rise Mu, PA-C sent at 10/21/2019  7:55 AM EST ----- -Cholesterol is elevated at 221. Triglycerides are a little high at 153. LDL is elevated at 150.  -Fasting glucose is elevated with known diabetes.  -Renal function remains elevated, though is stable and at his approximate baseline.  -Potassium is mildly elevated. Please ensure he is not adding seasonings or salt substitutes that are high in potassium.   -Liver function is normal.  -Continue with recently started Lipitor.  -Decrease sugary foods and drinks.  -Recheck lipid panel in 3 months, if LDL remains above 70 at that time, add Zetia 10 mg daily.

## 2019-10-22 NOTE — Telephone Encounter (Signed)
Call to patient to discuss lab results and POC.   Pt verbalized understanding and will [facility] 3 months for fasting labs.   Orders placed.   Advised pt to call for any further questions or concerns.

## 2019-11-06 ENCOUNTER — Ambulatory Visit: Payer: Medicare Other | Admitting: Urology

## 2019-11-06 ENCOUNTER — Other Ambulatory Visit: Payer: Self-pay

## 2019-11-06 ENCOUNTER — Ambulatory Visit: Payer: Medicare Other | Admitting: Cardiology

## 2019-11-06 ENCOUNTER — Encounter: Payer: Self-pay | Admitting: Urology

## 2019-11-06 VITALS — BP 157/72 | HR 70 | Ht 70.0 in | Wt 212.7 lb

## 2019-11-06 DIAGNOSIS — N401 Enlarged prostate with lower urinary tract symptoms: Secondary | ICD-10-CM | POA: Diagnosis not present

## 2019-11-06 DIAGNOSIS — Z87898 Personal history of other specified conditions: Secondary | ICD-10-CM | POA: Diagnosis not present

## 2019-11-06 DIAGNOSIS — R972 Elevated prostate specific antigen [PSA]: Secondary | ICD-10-CM

## 2019-11-06 LAB — BLADDER SCAN AMB NON-IMAGING

## 2019-11-06 NOTE — Progress Notes (Signed)
11/06/2019 12:15 PM   Nicholas Nolan [birth year] [id number]  Referring provider: Mar Daring, PA-C [place],  [place] [address]  Chief Complaint  Patient presents with  . Hospitalization Follow-up    retention     Urologic history: 1.  BPH with urinary retention -Episode urinary retention 11/25; inpatient consult Dr. Junious Silk  2.  Elevated PSA -Prostate biopsy 2010 PSA 5.4; volume 64 cc; benign -PSA 8.3 05/2016; MRI 103 cc volume, PI-RADS 3  -MR fusion biopsy 08/2016, 82 cc volume, ROI and 12 core template benign  HPI: 68 y.o. male admitted to [facility] late November 2020 with urinary retention, hyperkalemia.  He was started on tamsulosin.  Urine volume 1 L with catheter placement.  He was seen in the office and catheter was removed in early December and he was voiding without problems.  He states he has mild hesitancy in the morning.  He has no bothersome voiding symptoms.  He remains on tamsulosin.  He has not had a PSA since his fusion biopsy in 2017.   PMH: Past Medical History:  Diagnosis Date  . Back pain    leg pain  . Diabetes mellitus    type 2  . History of urinary retention   . Hyperlipidemia   . Hypertension   . Neuropathy    feet  . Primary cancer of right upper lobe of lung ([place]) 2014   RUL Lobectomy    Surgical History: Past Surgical History:  Procedure Laterality Date  . BASAL CELL CARCINOMA EXCISION    . LUMBAR LAMINECTOMY/DECOMPRESSION MICRODISCECTOMY  01/10/2012   Procedure: LUMBAR LAMINECTOMY/DECOMPRESSION MICRODISCECTOMY 2 LEVELS;  Surgeon: Hosie Spangle, MD;  Location: [facility];  Service: Neurosurgery;  Laterality: Bilateral;  Lumbar four-Sacral One laminectomies  . LUNG SURGERY Right 11/22/12   right upper lobe  . PROSTATE SURGERY     biopsy-due to elelvated PSA  . RENAL ANGIOGRAPHY Right 07/02/2019   Procedure: RENAL ANGIOGRAPHY;  Surgeon: Algernon Huxley, MD;  Location: [facility];  Service:  Cardiovascular;  Laterality: Right;  . SHOULDER SURGERY    . VASECTOMY      Home Medications:  Allergies as of 11/06/2019      Reactions   Codeine Itching   And hyper also   Hydromorphone Itching   Morphine And Related Itching      Medication List       Accurate as of November 06, 2019 12:15 PM. If you have any questions, ask your nurse or doctor.        amLODipine 10 MG tablet Commonly known as: NORVASC Take 1 tablet (10 mg total) by mouth daily.   atorvastatin 80 MG tablet Commonly known as: LIPITOR Take 1 tablet (80 mg total) by mouth daily.   cloNIDine 0.1 MG tablet Commonly known as: CATAPRES Take 1 tablet (0.1 mg total) by mouth at bedtime.   Farxiga 10 MG Tabs tablet Generic drug: dapagliflozin propanediol Take 10 mg by mouth every morning.   glimepiride 4 MG tablet Commonly known as: AMARYL Take 0.5 tablets (2 mg total) by mouth daily.   hydrochlorothiazide 25 MG tablet Commonly known as: HYDRODIURIL Take 25 mg by mouth daily.   isosorbide mononitrate 30 MG 24 hr tablet Commonly known as: IMDUR Take 1 tablet (30 mg total) by mouth daily.   metFORMIN 1000 MG tablet Commonly known as: GLUCOPHAGE Take 1 tablet (1,000 mg total) by mouth daily with breakfast.   nitroGLYCERIN 0.4 MG SL  tablet Commonly known as: NITROSTAT Place 1 tablet (0.4 mg total) under the tongue every 5 (five) minutes as needed for chest pain.   tamsulosin 0.4 MG Caps capsule Commonly known as: FLOMAX Take 1 capsule (0.4 mg total) by mouth daily after supper.   Trulicity 1.5 OJ/5.0KX Sopn Generic drug: Dulaglutide Inject 1.5 mg into the skin once a week.   verapamil 240 MG CR tablet Commonly known as: CALAN-SR Take 1 tablet (240 mg total) by mouth at bedtime.       Allergies:  Allergies  Allergen Reactions  . Codeine Itching    And hyper also  . Hydromorphone Itching  . Morphine And Related Itching    Family History: Family History  Problem Relation Age of  Onset  . Cancer Father        prostate  . Heart disease Father        MI  . Stroke Father   . Cancer Brother        liver    Social History:  reports that he has quit smoking. His smoking use included cigarettes. He has a 90.00 pack-year smoking history. His smokeless tobacco use includes snuff and chew. He reports that he does not drink alcohol or use drugs.  ROS: UROLOGY Frequent Urination?: Yes Hard to postpone urination?: No Burning/pain with urination?: No Get up at night to urinate?: Yes Leakage of urine?: No Urine stream starts and stops?: No Trouble starting stream?: No Do you have to strain to urinate?: No Blood in urine?: No Urinary tract infection?: No Sexually transmitted disease?: No Injury to kidneys or bladder?: No Painful intercourse?: No Weak stream?: No Erection problems?: No Penile pain?: No  Gastrointestinal Nausea?: No Vomiting?: No Indigestion/heartburn?: No Diarrhea?: No Constipation?: Yes  Constitutional Fever: No Night sweats?: No Weight loss?: No Fatigue?: Yes  Skin Skin rash/lesions?: No Itching?: No  Eyes Blurred vision?: No Double vision?: No  Ears/Nose/Throat Sore throat?: No Sinus problems?: No  Hematologic/Lymphatic Swollen glands?: No Easy bruising?: Yes  Cardiovascular Leg swelling?: No Chest pain?: No  Respiratory Cough?: No Shortness of breath?: No  Endocrine Excessive thirst?: No  Musculoskeletal Back pain?: Yes Joint pain?: Yes  Neurological Headaches?: No Dizziness?: No  Psychologic Depression?: No Anxiety?: No  Physical Exam: BP (!) 157/72 (BP Location: Left Arm, Patient Position: Sitting, Cuff Size: Normal)   Pulse 70   Ht 5\' 10"  (1.778 m)   Wt 212 lb 11.2 oz (96.5 kg)   BMI 30.52 kg/m   Constitutional:  Alert and oriented, No acute distress. HEENT: [place] AT, moist mucus membranes.  Trachea midline, no masses. Cardiovascular: No clubbing, cyanosis, or edema. Respiratory: Normal  respiratory effort, no increased work of breathing. GU: Prostate 60+ cc, smooth without nodules Skin: No rashes, bruises or suspicious lesions. Neurologic: Grossly intact, no focal deficits, moving all 4 extremities. Psychiatric: Normal mood and affect.    Assessment & Plan:    - BPH with lower urinary tract symptoms Urinary retention has resolved.  PVR by bladder scan today was 56 mL.  He is currently satisfied with his voiding pattern on tamsulosin and will continue.  2. Elevated PSA Benign DRE.  Last PSA was in 2017.  PSA was ordered today and if stable would recommend annual follow-up.   Abbie Sons, [place] [address] [place], [place] [address] [phone]

## 2019-11-07 ENCOUNTER — Institutional Professional Consult (permissible substitution): Payer: Medicare Other | Admitting: Pulmonary Disease

## 2019-11-07 LAB — PSA: Prostate Specific Ag, Serum: 16.8 ng/mL — ABNORMAL HIGH (ref 0.0–4.0)

## 2019-11-08 ENCOUNTER — Telehealth: Payer: Self-pay | Admitting: Urology

## 2019-11-08 DIAGNOSIS — R972 Elevated prostate specific antigen [PSA]: Secondary | ICD-10-CM

## 2019-11-08 NOTE — Telephone Encounter (Signed)
PSA has increased to 16.8.  Since he had a recent episode of urinary retention and a catheter would recommend a urinalysis and a repeat PSA in about 6 weeks.

## 2019-11-08 NOTE — Telephone Encounter (Signed)
Pt states he received a notification of a lab result but can't remember his PW to see his PSA results. Explained to the pt that someone would call him with his results after Dr. Bernardo Heater has had a chance to review them.  Please advise. Thanks

## 2019-11-12 NOTE — Telephone Encounter (Signed)
Called pt no answer. Left detailed message for pt informing him of the information below. Advised pt to call back to schedule lab appt. Orders placed.

## 2019-11-19 ENCOUNTER — Ambulatory Visit: Payer: Medicare Other | Admitting: Physician Assistant

## 2019-11-29 ENCOUNTER — Ambulatory Visit (INDEPENDENT_AMBULATORY_CARE_PROVIDER_SITE_OTHER): Payer: Medicare PPO | Admitting: Internal Medicine

## 2019-11-29 ENCOUNTER — Encounter: Payer: Self-pay | Admitting: Internal Medicine

## 2019-11-29 ENCOUNTER — Other Ambulatory Visit: Payer: Self-pay

## 2019-11-29 VITALS — BP 140/60 | HR 80 | Temp 98.2°F | Ht 70.0 in | Wt 211.4 lb

## 2019-11-29 DIAGNOSIS — G4719 Other hypersomnia: Secondary | ICD-10-CM

## 2019-11-29 NOTE — Progress Notes (Signed)
Name: Nicholas Nolan MRN: [id number] DOB: 11-03-1951     CONSULTATION DATE: 11/29/2019 REFERRING MD : Idolina Primer  CHIEF COMPLAINT: excessive daytime sleepiness   HISTORY OF PRESENT ILLNESS: 69 y.o. male with history of CAD noted on recent coronary CTA, DM, CKD stage III, HLD, difficult to control HTN, lung cancer s/p partial lobectomy in 2016, prior tobacco abuse, neuropathy, Was sent to [place] for assessment for excessive daytime sleepiness and snoring   Patient has  a long history of hypertension that has been difficult to control on many drug regimens. Renal ultrasound in 05/2019 showed elevated peak systolic velocity in the mid segment of the right renal artery with follow up renal angiogram in 06/2019 showed no more than 15% stenosis on selective imaging of the bilateral renal arteries. on HCTZ 25 mg, Benicar 40 mg, verapamil 240 mg, clonidine 0.1 mg tid. Echo showedan EF of 60 to 65%, mild LVH, diastolic dysfunction, normal RV systolic function and cavity size, and mild mitral vegetation.    He continues to note fatigue particularly when his BP is elevated.  BP continues to run in the 202B to 343H systolic.     Patient is seen today for problems and issues with sleep related to excessive daytime sleepiness Patient  has been having sleep problems for many years Patient has been having excessive daytime sleepiness for a long time Patient has been having extreme fatigue and tiredness, lack of energy +  very Loud snoring every night + struggling breathe at night and gasps for air   Discussed sleep data and reviewed with patient.  Encouraged proper weight management.  Discussed driving precautions and its relationship with hypersomnolence.  Discussed operating dangerous equipment and its relationship with hypersomnolence.  Discussed sleep hygiene, and benefits of a fixed sleep waked time.  The importance of getting eight or more hours of sleep discussed with patient.  Discussed  limiting the use of the computer and television before bedtime.  Decrease naps during the day, so night time sleep will become enhanced.  Limit caffeine, and sleep deprivation.  HTN, stroke, and heart failure are potential risk factors.    EPWORTH SLEEP SCORE 8   PAST MEDICAL HISTORY :   has a past medical history of Back pain, Diabetes mellitus, History of urinary retention, Hyperlipidemia, Hypertension, Neuropathy, and Primary cancer of right upper lobe of lung ([place]) (2014).  has a past surgical history that includes Shoulder surgery; Excision basal cell carcinoma; Lumbar laminectomy/decompression microdiscectomy (01/10/2012); Lung surgery (Right, 11/22/12); Vasectomy; Prostate surgery; and RENAL ANGIOGRAPHY (Right, 07/02/2019). Prior to Admission medications   Medication Sig Start Date End Date Taking? Authorizing Provider  amLODipine (NORVASC) 10 MG tablet Take 1 tablet (10 mg total) by mouth daily. 07/24/19 10/22/19  Kate Sable, MD  atorvastatin (LIPITOR) 80 MG tablet Take 1 tablet (80 mg total) by mouth daily. 10/15/19 01/13/20  Rise Mu, PA-C  cloNIDine (CATAPRES) 0.1 MG tablet Take 1 tablet (0.1 mg total) by mouth at bedtime. 10/19/19   Dunn, Areta Haber, PA-C  Dulaglutide (TRULICITY) 1.5 WY/6.1UO SOPN Inject 1.5 mg into the skin once a week.  12/17/15   [provider]  FARXIGA 10 MG TABS tablet Take 10 mg by mouth every morning. 08/31/19   [provider]  glimepiride (AMARYL) 4 MG tablet Take 0.5 tablets (2 mg total) by mouth daily. 10/01/19   Mar Daring, PA-C  hydrochlorothiazide (HYDRODIURIL) 25 MG tablet Take 25 mg by mouth daily. 10/28/19   [provider]  isosorbide mononitrate (IMDUR) 30 MG 24 hr tablet Take 1 tablet (30 mg total) by mouth daily. 10/19/19 01/17/20  Rise Mu, PA-C  metFORMIN (GLUCOPHAGE) 1000 MG tablet Take 1 tablet (1,000 mg total) by mouth daily with breakfast. 10/01/19   Mar Daring, PA-C  nitroGLYCERIN  (NITROSTAT) 0.4 MG SL tablet Place 1 tablet (0.4 mg total) under the tongue every 5 (five) minutes as needed for chest pain. 10/19/19 01/17/20  Rise Mu, PA-C  tamsulosin (FLOMAX) 0.4 MG CAPS capsule Take 1 capsule (0.4 mg total) by mouth daily after supper. 10/09/19   Festus Aloe, MD  verapamil (CALAN-SR) 240 MG CR tablet Take 1 tablet (240 mg total) by mouth at bedtime. 10/01/19   Mar Daring, PA-C  metFORMIN (GLUCOPHAGE) 1000 MG tablet TAKE ONE TABLET BY MOUTH TWICE A DAY WITH A MEAL 03/14/18   Mar Daring, PA-C   Allergies  Allergen Reactions  . Codeine Itching    And hyper also  . Hydromorphone Itching  . Morphine And Related Itching    FAMILY HISTORY:  family history includes Cancer in his brother and father; Heart disease in his father; Stroke in his father. SOCIAL HISTORY:  reports that he has quit smoking. His smoking use included cigarettes. He has a 90.00 pack-year smoking history. His smokeless tobacco use includes snuff and chew. He reports that he does not drink alcohol or use drugs.    Review of Systems:  Gen:  Denies  fever, sweats, chills weigh loss  HEENT: Denies blurred vision, double vision, ear pain, eye pain, hearing loss, nose bleeds, sore throat Cardiac:  No dizziness, chest pain or heaviness, chest tightness,edema, No JVD Resp:   No cough, -sputum production, -shortness of breath,-wheezing, -hemoptysis,  Gi: Denies swallowing difficulty, stomach pain, nausea or vomiting, diarrhea, constipation, bowel incontinence Gu:  Denies bladder incontinence, burning urine Ext:   Denies Joint pain, stiffness or swelling Skin: Denies  skin rash, easy bruising or bleeding or hives Endoc:  Denies polyuria, polydipsia , polyphagia or weight change Psych:   Denies depression, insomnia or hallucinations  Other:  All other systems negative   BP 140/60 (BP Location: Left Arm, Patient Position: Sitting, Cuff Size: Normal)   Pulse 80   Temp 98.2 F (36.8  C) (Oral)   Ht 5\' 10"  (1.778 m)   Wt 211 lb 6.4 oz (95.9 kg)   SpO2 97% Comment: on RA  BMI 30.33 kg/m     Physical Examination:   GENERAL:NAD, no fevers, chills, no weakness no fatigue HEAD: Normocephalic, atraumatic.  EYES: PERLA, EOMI No scleral icterus.  NECK: Supple.  PULMONARY: CTA B/L no wheezing, rhonchi, crackles CARDIOVASCULAR: S1 and S2. Regular rate and rhythm. No murmurs GASTROINTESTINAL: Soft, nontender, nondistended. Positive bowel sounds.  MUSCULOSKELETAL: No swelling, clubbing, or edema.  NEUROLOGIC: No gross focal neurological deficits. 5/5 strength all extremities SKIN: No ulceration, lesions, rashes, or cyanosis.  PSYCHIATRIC: Insight, judgment intact. -depression -anxiety ALL OTHER ROS ARE NEGATIVE   MEDICATIONS: I have reviewed all medications and confirmed regimen as documented     ASSESSMENT AND PLAN SYNOPSIS 69 yo WM with significant CAD with HTN with signs and symptoms of excessive daytime sleepiness and fatigue with snoring to suggest Obstructive sleep apnea  Patient will need Sleep Study for definitive diagnosis  Obesity -recommend significant weight loss -recommend changing diet  Deconditioned state -Recommend increased daily activity and exercise  HTN/DM Underlying medical conditions can be affected by untreated OSA Patient will need therapy for OSA  therapy of he is diagnosed  COVID-19 EDUCATION: The signs and symptoms of COVID-19 were discussed with the patient and how to seek care for testing.  The importance of social distancing was discussed today. Hand Washing Techniques and avoid touching face was advised.     MEDICATION ADJUSTMENTS/LABS AND TESTS ORDERED: Sleep study ordered   CURRENT MEDICATIONS REVIEWED AT LENGTH WITH PATIENT TODAY   Patient satisfied with Plan of action and management. All questions answered  Follow up in 3 months   Iysha Mishkin Patricia Pesa, M.D.  [facility]  Medical  Director [place] Director [facility] Cardio-Pulmonary Department

## 2019-11-29 NOTE — Patient Instructions (Signed)
PATIENT WILL NEED SLEEP STUDY FOR ASSESSMENT

## 2019-12-05 NOTE — Progress Notes (Signed)
Subjective:   Nicholas Nolan is a 69 y.o. male who presents for Medicare Annual/Subsequent preventive examination.    This visit is being conducted through telemedicine due to the COVID-19 pandemic. This patient has given me verbal consent via doximity to conduct this visit, patient states they are participating from their home address. Some vital signs may be absent or patient reported.    Patient identification: identified by name, DOB, and current address  Review of Systems:  N/A  Cardiac Risk Factors include: advanced age (>64men, >37 women);diabetes mellitus;dyslipidemia;hypertension;male gender;Other (see comment), Risk factor comments: Snuff use     Objective:    Vitals: There were no vitals taken for this visit.  There is no height or weight on file to calculate BMI. Unable to obtain vitals due to visit being conducted via telephonically.   Advanced Directives 12/06/2019 10/03/2019 10/02/2019 07/02/2019 12/04/2018 01/07/2012  Does Patient Have a Medical Advance Directive? No No No No No Patient does not have advance directive;Patient would not like information  Would patient like information on creating a medical advance directive? No - Patient declined No - Patient declined No - Patient declined - No - Patient declined -  Pre-existing out of facility DNR order (yellow form or pink MOST form) - - - - - No    Tobacco Social History   Tobacco Use  Smoking Status Former Smoker  . Packs/day: 2.00  . Years: 45.00  . Pack years: 90.00  . Types: Cigarettes  . Quit date: 11/28/2012  . Years since quitting: 7.0  Smokeless Tobacco Current User  . Types: Snuff  Tobacco Comment   Quit in 2014     Ready to quit: Not Answered Counseling given: Not Answered Comment: Quit in 2014   Clinical Intake:  Pre-visit preparation completed: Yes  Pain : No/denies pain Pain Score: 0-No pain     Nutritional Risks: None Diabetes: Yes  How often do you need to have someone help  you when you read instructions, pamphlets, or other written materials from your doctor or pharmacy?: 1 - Never   Diabetes:  Is the patient diabetic?  Yes  If diabetic, was a CBG obtained today?  No  Did the patient bring in their glucometer from home?  No  How often do you monitor your CBG's? Not very often, currently does not have a meter. Pt to check with endocrinologist about getting a new meter.   Financial Strains and Diabetes Management:  Are you having any financial strains with the device, your supplies or your medication? No .  Does the patient want to be seen by Chronic Care Management for management of their diabetes?  No  Would the patient like to be referred to a Nutritionist or for Diabetic Management?  No   Diabetic Exams:  Diabetic Eye Exam: Completed fall 2020 per pt. Requested records to be sent to clinic.  Diabetic Foot Exam: Completed 08/17/19. Repeat yearly.  Interpreter Needed?: No  Information entered by :: [facility], LPN  Past Medical History:  Diagnosis Date  . Back pain    leg pain  . Diabetes mellitus    type 2  . History of urinary retention   . Hyperlipidemia   . Hypertension   . Neuropathy    feet  . Primary cancer of right upper lobe of lung ([place]) 2014   RUL Lobectomy   Past Surgical History:  Procedure Laterality Date  . BASAL CELL CARCINOMA EXCISION    . LUMBAR LAMINECTOMY/DECOMPRESSION MICRODISCECTOMY  01/10/2012   Procedure: LUMBAR LAMINECTOMY/DECOMPRESSION MICRODISCECTOMY 2 LEVELS;  Surgeon: Hosie Spangle, MD;  Location: [facility];  Service: Neurosurgery;  Laterality: Bilateral;  Lumbar four-Sacral One laminectomies  . LUNG SURGERY Right 11/22/12   right upper lobe  . PROSTATE SURGERY     biopsy-due to elelvated PSA  . RENAL ANGIOGRAPHY Right 07/02/2019   Procedure: RENAL ANGIOGRAPHY;  Surgeon: Algernon Huxley, MD;  Location: [facility];  Service: Cardiovascular;  Laterality: Right;  . SHOULDER SURGERY    . VASECTOMY      Family History  Problem Relation Age of Onset  . Cancer Father        prostate  . Heart disease Father        MI  . Stroke Father   . Cancer Brother        liver   Social History   Socioeconomic History  . Marital status: Married    Spouse name: Not on file  . Number of children: 4  . Years of education: Not on file  . Highest education level: Associate degree: occupational, Hotel manager, or vocational program  Occupational History  . Occupation: retired  Tobacco Use  . Smoking status: Former Smoker    Packs/day: 2.00    Years: 45.00    Pack years: 90.00    Types: Cigarettes    Quit date: 11/28/2012    Years since quitting: 7.0  . Smokeless tobacco: Current User    Types: Snuff  . Tobacco comment: Quit in 2014  Substance and Sexual Activity  . Alcohol use: No  . Drug use: No  . Sexual activity: Yes    Birth control/protection: None  Other Topics Concern  . Not on file  Social History Narrative  . Not on file   Social Determinants of Health   Financial Resource Strain: Low Risk   . Difficulty of Paying Living Expenses: Not hard at all  Food Insecurity: No Food Insecurity  . Worried About Charity fundraiser in the Last Year: Never true  . Ran Out of Food in the Last Year: Never true  Transportation Needs: No Transportation Needs  . Lack of Transportation (Medical): No  . Lack of Transportation (Non-Medical): No  Physical Activity: Inactive  . Days of Exercise per Week: 0 days  . Minutes of Exercise per Session: 0 min  Stress: No Stress Concern Present  . Feeling of Stress : Not at all  Social Connections: Slightly Isolated  . Frequency of Communication with Friends and Family: More than three times a week  . Frequency of Social Gatherings with Friends and Family: More than three times a week  . Attends Religious Services: More than 4 times per year  . Active Member of Clubs or Organizations: No  . Attends Archivist Meetings: Never  . Marital  Status: Married    Outpatient Encounter Medications as of 12/06/2019  Medication Sig  . amLODipine (NORVASC) 10 MG tablet Take 1 tablet (10 mg total) by mouth daily.  Marland Kitchen atorvastatin (LIPITOR) 80 MG tablet Take 1 tablet (80 mg total) by mouth daily.  . cloNIDine (CATAPRES) 0.1 MG tablet Take 1 tablet (0.1 mg total) by mouth at bedtime.  . Dulaglutide (TRULICITY) 1.5 XV/4.0GQ SOPN Inject 1.5 mg into the skin once a week.   Marland Kitchen FARXIGA 10 MG TABS tablet Take 10 mg by mouth every morning.  Marland Kitchen glimepiride (AMARYL) 4 MG tablet Take 0.5 tablets (2 mg total) by mouth daily.  Marland Kitchen  glucose blood (ONETOUCH VERIO) test strip USE ONCE DAILY. USE AS INSTRUCTED.  Marland Kitchen metFORMIN (GLUCOPHAGE) 1000 MG tablet Take 1 tablet (1,000 mg total) by mouth daily with breakfast.  . nitroGLYCERIN (NITROSTAT) 0.4 MG SL tablet Place 1 tablet (0.4 mg total) under the tongue every 5 (five) minutes as needed for chest pain.  . tamsulosin (FLOMAX) 0.4 MG CAPS capsule Take 1 capsule (0.4 mg total) by mouth daily after supper.  . verapamil (CALAN-SR) 240 MG CR tablet Take 1 tablet (240 mg total) by mouth at bedtime.  . [DISCONTINUED] metFORMIN (GLUCOPHAGE) 1000 MG tablet TAKE ONE TABLET BY MOUTH TWICE A DAY WITH A MEAL   No facility-administered encounter medications on file as of 12/06/2019.    Activities of Daily Living In your present state of health, do you have any difficulty performing the following activities: 12/06/2019 10/03/2019  Hearing? N -  Vision? N -  Difficulty concentrating or making decisions? N -  Walking or climbing stairs? N -  Dressing or bathing? N -  Doing errands, shopping? N N  Preparing Food and eating ? N -  Using the Toilet? N -  In the past six months, have you accidently leaked urine? N -  Do you have problems with loss of bowel control? N -  Managing your Medications? N -  Managing your Finances? N -  Housekeeping or managing your Housekeeping? N -  Some recent data might be hidden    Patient  Care Team: Mar Daring, PA-C as PCP - General (Family Medicine) Kate Sable, MD as PCP - Cardiology (Cardiology) Anell Barr, OD (Optometry) Solum, Betsey Holiday, MD as Physician Assistant (Endocrinology) Abbie Sons, MD (Urology)   Assessment:   This is a routine wellness examination for [place].  Exercise Activities and Dietary recommendations Current Exercise Habits: The patient does not participate in regular exercise at present, Exercise limited by: None identified  Goals    . DIET - REDUCE SUGAR INTAKE     Recommend to continue current diet plan of cutting out all bad carbohydrates and sugar in diet to help aid in weight loss.     . Exercise 3x per week (30 min per time)     Recommend to exercise for 3 days a week for at least 30 minutes at a time.        Fall Risk Fall Risk  12/06/2019 12/04/2018 11/15/2016  Falls in the past year? 0 0 No  Number falls in past yr: 0 - -  Injury with Fall? 0 - -   FALL RISK PREVENTION PERTAINING TO THE HOME:  Any stairs in or around the home? Yes  If so, are there any without handrails? No   Home free of loose throw rugs in walkways, pet beds, electrical cords, etc? Yes  Adequate lighting in your home to reduce risk of falls? Yes   ASSISTIVE DEVICES UTILIZED TO PREVENT FALLS:  Life alert? No  Use of a cane, walker or w/c? No  Grab bars in the bathroom? No  Shower chair or bench in shower? No  Elevated toilet seat or a handicapped toilet? Yes    TIMED UP AND GO:  Was the test performed? No .    Depression Screen PHQ 2/9 Scores 12/06/2019 12/06/2019 12/04/2018 12/04/2018  PHQ - 2 Score 0 0 0 0  PHQ- 9 Score - - 0 -    Cognitive Function     6CIT Screen 12/06/2019 12/04/2018  What Year? 0 points 0  points  What month? 0 points 0 points  What time? 0 points 0 points  Count back from 20 0 points 0 points  Months in reverse 0 points 0 points  Repeat phrase 0 points 0 points  Total Score 0 0    Immunization  History  Administered Date(s) Administered  . Td 06/07/2007  . Tdap 06/07/2007, 07/17/2017    Qualifies for Shingles Vaccine? Yes . Due for Shingrix. Pt has been advised to call insurance company to determine out of pocket expense. Advised may also receive vaccine at local pharmacy or Health Dept. Verbalized acceptance and understanding.  Tdap: Up to date  Flu Vaccine: Due for Flu vaccine. Does the patient want to receive this vaccine today?  No . Advised may receive this vaccine at local pharmacy or Health Dept. Aware to provide a copy of the vaccination record if obtained from local pharmacy or Health Dept. Verbalized acceptance and understanding.  Pneumococcal Vaccine: Due for Pneumococcal vaccine. Does the patient want to receive this vaccine today?  No . Advised may receive this vaccine at local pharmacy or Health Dept. Aware to provide a copy of the vaccination record if obtained from local pharmacy or Health Dept. Verbalized acceptance and understanding.   Screening Tests Health Maintenance  Topic Date Due  . Hepatitis C Screening  [birth year]  . OPHTHALMOLOGY EXAM  04/05/1961  . COLONOSCOPY  04/05/2001  . INFLUENZA VACCINE  02/06/2020 (Originally 06/09/2019)  . PNA vac Low Risk Adult (1 of 2 - PCV13) 12/05/2020 (Originally 04/05/2016)  . HEMOGLOBIN A1C  02/08/2020  . FOOT EXAM  08/16/2020  . TETANUS/TDAP  07/18/2027   Cancer Screenings:  Colorectal Screening: FOBT completed 05/18/18. Repeat every year. Referral to GI placed today. Pt aware the office will call re: appt.  Lung Cancer Screening: (Low Dose CT Chest recommended if Age 66-80 years, 30 pack-year currently smoking OR have quit w/in 15years.) does qualify however declines today.  Additional Screening:  Hepatitis C Screening: does qualify and would like this added to next order for blood work.   Dental Screening: Recommended annual dental exams for proper oral hygiene  Community Resource Referral:  CRR required this  visit?  No        Plan:  I have personally reviewed and addressed the Medicare Annual Wellness questionnaire and have noted the following in the patient's chart:  A. Medical and social history B. Use of alcohol, tobacco or illicit drugs  C. Current medications and supplements D. Functional ability and status E.  Nutritional status F.  Physical activity G. Advance directives H. List of other physicians I.  Hospitalizations, surgeries, and ER visits in previous 12 months J.  [place] such as hearing and vision if needed, cognitive and depression L. Referrals and appointments   In addition, I have reviewed and discussed with patient certain preventive protocols, quality metrics, and best practice recommendations. A written personalized care plan for preventive services as well as general preventive health recommendations were provided to patient.   Glendora Score, [place]  [id number] Nurse Health Advisor  Nurse Notes: Pt would like the Hep C lab order add to next blood work orders. Colonoscopy referral sent today. Requested eye exam notes from 2020 to be faxed to clinic.

## 2019-12-06 ENCOUNTER — Other Ambulatory Visit: Payer: Self-pay

## 2019-12-06 ENCOUNTER — Telehealth: Payer: Self-pay

## 2019-12-06 ENCOUNTER — Ambulatory Visit (INDEPENDENT_AMBULATORY_CARE_PROVIDER_SITE_OTHER): Payer: Medicare PPO

## 2019-12-06 DIAGNOSIS — Z1211 Encounter for screening for malignant neoplasm of colon: Secondary | ICD-10-CM

## 2019-12-06 DIAGNOSIS — Z0000 Encounter for general adult medical examination without abnormal findings: Secondary | ICD-10-CM | POA: Diagnosis not present

## 2019-12-06 MED ORDER — NA SULFATE-K SULFATE-MG SULF 17.5-3.13-1.6 GM/177ML PO SOLN: 354.0000 mL | Freq: Once | ORAL | 0 refills | Status: AC

## 2019-12-06 NOTE — Patient Instructions (Signed)
Nicholas Nolan , Thank you for taking time to come for your Medicare Wellness Visit. I appreciate your ongoing commitment to your health goals. Please review the following plan we discussed and let me know if I can assist you in the future.   Screening recommendations/referrals: Colonoscopy: Currently due. Referral to GI placed today. Pt aware the office will call re: appt. Recommended yearly ophthalmology/optometry visit for glaucoma screening and checkup Recommended yearly dental visit for hygiene and checkup  Vaccinations: Influenza vaccine: Pt declines today.  Pneumococcal vaccine: Pt declines today.  Tdap vaccine: Up to date, due 07/2027 Shingles vaccine: Pt declines today.     Advanced directives: Advance directive discussed with you today. Even though you declined this today please call our office should you change your mind and we can give you the proper paperwork for you to fill out.  Conditions/risks identified: Recommend to exercise for 3 days a week for at least 30 minutes at a time.   Next appointment: 12/11/20 @ 11:00 AM for an AWV. Declined scheduling a follow up with PCP at this time.  Preventive Care 69 Years and Older, Male Preventive care refers to lifestyle choices and visits with your health care provider that can promote health and wellness. What does preventive care include?  A yearly physical exam. This is also called an annual well check.  Dental exams once or twice a year.  Routine eye exams. Ask your health care provider how often you should have your eyes checked.  Personal lifestyle choices, including:  Daily care of your teeth and gums.  Regular physical activity.  Eating a healthy diet.  Avoiding tobacco and drug use.  Limiting alcohol use.  Practicing safe sex.  Taking low doses of aspirin every day.  Taking vitamin and mineral supplements as recommended by your health care provider. What happens during an annual well check? The services and  screenings done by your health care provider during your annual well check will depend on your age, overall health, lifestyle risk factors, and family history of disease. Counseling  Your health care provider may ask you questions about your:  Alcohol use.  Tobacco use.  Drug use.  Emotional well-being.  Home and relationship well-being.  Sexual activity.  Eating habits.  History of falls.  Memory and ability to understand (cognition).  Work and work Statistician. Screening  You may have the following tests or measurements:  Height, weight, and BMI.  Blood pressure.  Lipid and cholesterol levels. These may be checked every 5 years, or more frequently if you are over 82 years old.  Skin check.  Lung cancer screening. You may have this screening every year starting at age 45 if you have a 30-pack-year history of smoking and currently smoke or have quit within the past 15 years.  Fecal occult blood test (FOBT) of the stool. You may have this test every year starting at age 72.  Flexible sigmoidoscopy or colonoscopy. You may have a sigmoidoscopy every 5 years or a colonoscopy every 10 years starting at age 55.  Prostate cancer screening. Recommendations will vary depending on your family history and other risks.  Hepatitis C blood test.  Hepatitis B blood test.  Sexually transmitted disease (STD) testing.  Diabetes screening. This is done by checking your blood sugar (glucose) after you have not eaten for a while (fasting). You may have this done every 1-3 years.  Abdominal aortic aneurysm (AAA) screening. You may need this if you are a current or former smoker.  Osteoporosis. You may be screened starting at age 31 if you are at high risk. Talk with your health care provider about your test results, treatment options, and if necessary, the need for more tests. Vaccines  Your health care provider may recommend certain vaccines, such as:  Influenza vaccine. This is  recommended every year.  Tetanus, diphtheria, and acellular pertussis (Tdap, Td) vaccine. You may need a Td booster every 10 years.  Zoster vaccine. You may need this after age 56.  Pneumococcal 13-valent conjugate (PCV13) vaccine. One dose is recommended after age 2.  Pneumococcal polysaccharide (PPSV23) vaccine. One dose is recommended after age 37. Talk to your health care provider about which screenings and vaccines you need and how often you need them. This information is not intended to replace advice given to you by your health care provider. Make sure you discuss any questions you have with your health care provider. Document Released: 11/21/2015 Document Revised: 07/14/2016 Document Reviewed: 08/26/2015 Elsevier Interactive Patient Education  2017 [place] Prevention in the Home Falls can cause injuries. They can happen to people of all ages. There are many things you can do to make your home safe and to help prevent falls. What can I do on the outside of my home?  Regularly fix the edges of walkways and driveways and fix any cracks.  Remove anything that might make you trip as you walk through a door, such as a raised step or threshold.  Trim any bushes or trees on the path to your home.  Use bright outdoor lighting.  Clear any walking paths of anything that might make someone trip, such as rocks or tools.  Regularly check to see if handrails are loose or broken. Make sure that both sides of any steps have handrails.  Any raised decks and porches should have guardrails on the edges.  Have any leaves, snow, or ice cleared regularly.  Use sand or salt on walking paths during winter.  Clean up any spills in your garage right away. This includes oil or grease spills. What can I do in the bathroom?  Use night lights.  Install grab bars by the toilet and in the tub and shower. Do not use towel bars as grab bars.  Use non-skid mats or decals in the tub or  shower.  If you need to sit down in the shower, use a plastic, non-slip stool.  Keep the floor dry. Clean up any water that spills on the floor as soon as it happens.  Remove soap buildup in the tub or shower regularly.  Attach bath mats securely with double-sided non-slip rug tape.  Do not have throw rugs and other things on the floor that can make you trip. What can I do in the bedroom?  Use night lights.  Make sure that you have a light by your bed that is easy to reach.  Do not use any sheets or blankets that are too big for your bed. They should not hang down onto the floor.  Have a firm chair that has side arms. You can use this for support while you get dressed.  Do not have throw rugs and other things on the floor that can make you trip. What can I do in the kitchen?  Clean up any spills right away.  Avoid walking on wet floors.  Keep items that you use a lot in easy-to-reach places.  If you need to reach something above you, use a strong step  stool that has a grab bar.  Keep electrical cords out of the way.  Do not use floor polish or wax that makes floors slippery. If you must use wax, use non-skid floor wax.  Do not have throw rugs and other things on the floor that can make you trip. What can I do with my stairs?  Do not leave any items on the stairs.  Make sure that there are handrails on both sides of the stairs and use them. Fix handrails that are broken or loose. Make sure that handrails are as long as the stairways.  Check any carpeting to make sure that it is firmly attached to the stairs. Fix any carpet that is loose or worn.  Avoid having throw rugs at the top or bottom of the stairs. If you do have throw rugs, attach them to the floor with carpet tape.  Make sure that you have a light switch at the top of the stairs and the bottom of the stairs. If you do not have them, ask someone to add them for you. What else can I do to help prevent  falls?  Wear shoes that:  Do not have high heels.  Have rubber bottoms.  Are comfortable and fit you well.  Are closed at the toe. Do not wear sandals.  If you use a stepladder:  Make sure that it is fully opened. Do not climb a closed stepladder.  Make sure that both sides of the stepladder are locked into place.  Ask someone to hold it for you, if possible.  Clearly mark and make sure that you can see:  Any grab bars or handrails.  First and last steps.  Where the edge of each step is.  Use tools that help you move around (mobility aids) if they are needed. These include:  Canes.  Walkers.  Scooters.  Crutches.  Turn on the lights when you go into a dark area. Replace any light bulbs as soon as they burn out.  Set up your furniture so you have a clear path. Avoid moving your furniture around.  If any of your floors are uneven, fix them.  If there are any pets around you, be aware of where they are.  Review your medicines with your doctor. Some medicines can make you feel dizzy. This can increase your chance of falling. Ask your doctor what other things that you can do to help prevent falls. This information is not intended to replace advice given to you by your health care provider. Make sure you discuss any questions you have with your health care provider. Document Released: 08/21/2009 Document Revised: 04/01/2016 Document Reviewed: 11/29/2014 Elsevier Interactive Patient Education  2017 [employer].

## 2019-12-06 NOTE — Telephone Encounter (Signed)
Gastroenterology Pre-Procedure Review    PATIENT REVIEW QUESTIONS: The patient responded to the following health history questions as indicated:    1. Are you having any GI issues? Yes Constipations  2. Do you have a personal history of Polyps? no 3. Do you have a family history of Colon Cancer or Polyps? Farther colon polyps 4. Diabetes Mellitus? Yes  5. Joint replacements in the past 12 months?no 6. Major health problems in the past 3 months?no 7. Any artificial heart valves, MVP, or defibrillator?no    MEDICATIONS & ALLERGIES:    Patient reports the following regarding taking any anticoagulation/antiplatelet therapy:   Plavix, Coumadin, Eliquis, Xarelto, Lovenox, Pradaxa, Brilinta, or Effient? no Aspirin? no  Patient confirms/reports the following medications:  Current Outpatient Medications  Medication Sig Dispense Refill  . amLODipine (NORVASC) 10 MG tablet Take 1 tablet (10 mg total) by mouth daily. 90 tablet 1  . atorvastatin (LIPITOR) 80 MG tablet Take 1 tablet (80 mg total) by mouth daily. 90 tablet 3  . cloNIDine (CATAPRES) 0.1 MG tablet Take 1 tablet (0.1 mg total) by mouth at bedtime. 90 tablet 1  . Dulaglutide (TRULICITY) 1.5 WN/0.2VO SOPN Inject 1.5 mg into the skin once a week.     Marland Kitchen FARXIGA 10 MG TABS tablet Take 10 mg by mouth every morning.    Marland Kitchen glimepiride (AMARYL) 4 MG tablet Take 0.5 tablets (2 mg total) by mouth daily. 90 tablet 1  . glucose blood (ONETOUCH VERIO) test strip USE ONCE DAILY. USE AS INSTRUCTED.    Marland Kitchen metFORMIN (GLUCOPHAGE) 1000 MG tablet Take 1 tablet (1,000 mg total) by mouth daily with breakfast. 90 tablet 1  . nitroGLYCERIN (NITROSTAT) 0.4 MG SL tablet Place 1 tablet (0.4 mg total) under the tongue every 5 (five) minutes as needed for chest pain. 90 tablet 3  . tamsulosin (FLOMAX) 0.4 MG CAPS capsule Take 1 capsule (0.4 mg total) by mouth daily after supper. 30 capsule 1  . verapamil (CALAN-SR) 240 MG CR tablet Take 1 tablet (240 mg total) by  mouth at bedtime. 90 tablet 1   No current facility-administered medications for this visit.    Patient confirms/reports the following allergies:  Allergies  Allergen Reactions  . Codeine Itching    And hyper also  . Hydromorphone Itching  . Morphine And Related Itching    No orders of the defined types were placed in this encounter.   AUTHORIZATION INFORMATION Primary Insurance: 1D#: Group #:  Secondary Insurance: 1D#: Group #:  SCHEDULE INFORMATION: Date: 12/25/2019 Time: Location: [place]

## 2019-12-14 ENCOUNTER — Telehealth: Payer: Self-pay | Admitting: Internal Medicine

## 2019-12-14 DIAGNOSIS — G4733 Obstructive sleep apnea (adult) (pediatric): Secondary | ICD-10-CM

## 2019-12-14 NOTE — Telephone Encounter (Signed)
Kim with [facility] called and stated that pt did not meet criteria for an in lab study and was wanting to know if patient was able to have a HST instead.  [employer] Ref # [id number].  Please advise.  Rhonda J Cobb

## 2019-12-17 NOTE — Telephone Encounter (Signed)
HST has been ordered. Will route to [facility] to make aware.

## 2019-12-17 NOTE — Telephone Encounter (Signed)
LMOVM for pt to return my call to advise that in lab study has been canceled and HST has been ordered per insurance criteria. Rhonda J Cobb

## 2019-12-17 NOTE — Telephone Encounter (Signed)
Please proceed with home sleep study

## 2019-12-17 NOTE — Telephone Encounter (Signed)
Spoke with Misty at [facility] and withdrew In lab study request.  Per Dr. Mortimer Fries, please place order for HST.    Please advise.   Thanks, Catha Gosselin

## 2019-12-18 ENCOUNTER — Telehealth: Payer: Self-pay

## 2019-12-18 NOTE — Telephone Encounter (Signed)
Returned patients call.  Advised that he should have his COVID test on Friday 12/21/19 at [place] located on [facility] between 10:30am and 12:30pm.  Thanks Julian, [place]

## 2019-12-19 ENCOUNTER — Other Ambulatory Visit: Payer: Self-pay

## 2019-12-19 ENCOUNTER — Encounter: Payer: Self-pay | Admitting: Gastroenterology

## 2019-12-19 NOTE — Telephone Encounter (Signed)
Spoke with patient on 12/18/2019 and advised patient that Insurance denied in lab study, so we will proceed with a HST.  Advised patient that this study has been approved by the insurance and order has been sent to the [facility] to contact him to schedule.  Pt voiced understanding and nothing else needed at this time. Rhonda J Cobb

## 2019-12-21 ENCOUNTER — Other Ambulatory Visit: Payer: Self-pay

## 2019-12-21 ENCOUNTER — Other Ambulatory Visit
Admission: RE | Admit: 2019-12-21 | Discharge: 2019-12-21 | Disposition: A | Discharge status: Home / Self Care | Payer: Medicare PPO | Source: Ambulatory Visit | Attending: Gastroenterology | Admitting: Gastroenterology

## 2019-12-21 DIAGNOSIS — Z01812 Encounter for preprocedural laboratory examination: Secondary | ICD-10-CM | POA: Insufficient documentation

## 2019-12-21 DIAGNOSIS — Z20822 Contact with and (suspected) exposure to covid-19: Secondary | ICD-10-CM | POA: Insufficient documentation

## 2019-12-21 NOTE — Discharge Instructions (Signed)
General Anesthesia, Adult, Care After This sheet gives you information about how to care for yourself after your procedure. Your health care provider may also give you more specific instructions. If you have problems or questions, contact your health care provider. What can I expect after the procedure? After the procedure, the following side effects are common:  Pain or discomfort at the IV site.  Nausea.  Vomiting.  Sore throat.  Trouble concentrating.  Feeling cold or chills.  Weak or tired.  Sleepiness and fatigue.  Soreness and body aches. These side effects can affect parts of the body that were not involved in surgery. Follow these instructions at home:  For at least 24 hours after the procedure:  Have a responsible adult stay with you. It is important to have someone help care for you until you are awake and alert.  Rest as needed.  Do not: ? Participate in activities in which you could fall or become injured. ? Drive. ? Use heavy machinery. ? Drink alcohol. ? Take sleeping pills or medicines that cause drowsiness. ? Make important decisions or sign legal documents. ? Take care of children on your own. Eating and drinking  Follow any instructions from your health care provider about eating or drinking restrictions.  When you feel hungry, start by eating small amounts of foods that are soft and easy to digest (bland), such as toast. Gradually return to your regular diet.  Drink enough fluid to keep your urine pale yellow.  If you vomit, rehydrate by drinking water, juice, or clear broth. General instructions  If you have sleep apnea, surgery and certain medicines can increase your risk for breathing problems. Follow instructions from your health care provider about wearing your sleep device: ? Anytime you are sleeping, including during daytime naps. ? While taking prescription pain medicines, sleeping medicines, or medicines that make you drowsy.  Return to  your normal activities as told by your health care provider. Ask your health care provider what activities are safe for you.  Take over-the-counter and prescription medicines only as told by your health care provider.  If you smoke, do not smoke without supervision.  Keep all follow-up visits as told by your health care provider. This is important. Contact a health care provider if:  You have nausea or vomiting that does not get better with medicine.  You cannot eat or drink without vomiting.  You have pain that does not get better with medicine.  You are unable to pass urine.  You develop a skin rash.  You have a fever.  You have redness around your IV site that gets worse. Get help right away if:  You have difficulty breathing.  You have chest pain.  You have blood in your urine or stool, or you vomit blood. Summary  After the procedure, it is common to have a sore throat or nausea. It is also common to feel tired.  Have a responsible adult stay with you for the first 24 hours after general anesthesia. It is important to have someone help care for you until you are awake and alert.  When you feel hungry, start by eating small amounts of foods that are soft and easy to digest (bland), such as toast. Gradually return to your regular diet.  Drink enough fluid to keep your urine pale yellow.  Return to your normal activities as told by your health care provider. Ask your health care provider what activities are safe for you. This information is not   intended to replace advice given to you by your health care provider. Make sure you discuss any questions you have with your health care provider. Document Revised: 10/28/2017 Document Reviewed: 06/10/2017 Elsevier Patient Education  2020 [employer] Inc[employer]  

## 2019-12-22 LAB — SARS CORONAVIRUS 2 (TAT 6-24 HRS): SARS Coronavirus 2: NEGATIVE

## 2019-12-24 ENCOUNTER — Other Ambulatory Visit: Payer: Self-pay

## 2019-12-24 ENCOUNTER — Other Ambulatory Visit: Payer: Medicare PPO

## 2019-12-24 DIAGNOSIS — R972 Elevated prostate specific antigen [PSA]: Secondary | ICD-10-CM

## 2019-12-25 ENCOUNTER — Other Ambulatory Visit: Payer: Self-pay

## 2019-12-25 ENCOUNTER — Ambulatory Visit: Payer: Medicare PPO | Admitting: Anesthesiology

## 2019-12-25 ENCOUNTER — Ambulatory Visit
Admission: RE | Admit: 2019-12-25 | Discharge: 2019-12-25 | Disposition: A | Discharge status: Home / Self Care | Payer: Medicare PPO | Attending: Gastroenterology | Admitting: Gastroenterology

## 2019-12-25 ENCOUNTER — Encounter: Payer: Self-pay | Admitting: Gastroenterology

## 2019-12-25 ENCOUNTER — Encounter
Admission: RE | Disposition: A | Discharge status: Home / Self Care | Payer: Self-pay | Source: Home / Self Care | Attending: Gastroenterology

## 2019-12-25 ENCOUNTER — Telehealth: Payer: Self-pay | Admitting: Urology

## 2019-12-25 DIAGNOSIS — E1140 Type 2 diabetes mellitus with diabetic neuropathy, unspecified: Secondary | ICD-10-CM | POA: Insufficient documentation

## 2019-12-25 DIAGNOSIS — E119 Type 2 diabetes mellitus without complications: Secondary | ICD-10-CM | POA: Insufficient documentation

## 2019-12-25 DIAGNOSIS — Z7984 Long term (current) use of oral hypoglycemic drugs: Secondary | ICD-10-CM | POA: Diagnosis not present

## 2019-12-25 DIAGNOSIS — Z85828 Personal history of other malignant neoplasm of skin: Secondary | ICD-10-CM | POA: Insufficient documentation

## 2019-12-25 DIAGNOSIS — K635 Polyp of colon: Secondary | ICD-10-CM | POA: Diagnosis not present

## 2019-12-25 DIAGNOSIS — D122 Benign neoplasm of ascending colon: Secondary | ICD-10-CM | POA: Insufficient documentation

## 2019-12-25 DIAGNOSIS — E785 Hyperlipidemia, unspecified: Secondary | ICD-10-CM | POA: Diagnosis not present

## 2019-12-25 DIAGNOSIS — Z1211 Encounter for screening for malignant neoplasm of colon: Secondary | ICD-10-CM | POA: Insufficient documentation

## 2019-12-25 DIAGNOSIS — Z79899 Other long term (current) drug therapy: Secondary | ICD-10-CM | POA: Insufficient documentation

## 2019-12-25 DIAGNOSIS — K648 Other hemorrhoids: Secondary | ICD-10-CM | POA: Insufficient documentation

## 2019-12-25 DIAGNOSIS — D128 Benign neoplasm of rectum: Secondary | ICD-10-CM | POA: Insufficient documentation

## 2019-12-25 DIAGNOSIS — Z85118 Personal history of other malignant neoplasm of bronchus and lung: Secondary | ICD-10-CM | POA: Diagnosis not present

## 2019-12-25 DIAGNOSIS — R972 Elevated prostate specific antigen [PSA]: Secondary | ICD-10-CM

## 2019-12-25 DIAGNOSIS — I10 Essential (primary) hypertension: Secondary | ICD-10-CM | POA: Insufficient documentation

## 2019-12-25 DIAGNOSIS — K621 Rectal polyp: Secondary | ICD-10-CM | POA: Diagnosis not present

## 2019-12-25 DIAGNOSIS — D123 Benign neoplasm of transverse colon: Secondary | ICD-10-CM | POA: Diagnosis not present

## 2019-12-25 DIAGNOSIS — Z87891 Personal history of nicotine dependence: Secondary | ICD-10-CM | POA: Diagnosis not present

## 2019-12-25 HISTORY — DX: Presence of dental prosthetic device (complete) (partial): Z97.2

## 2019-12-25 HISTORY — PX: COLONOSCOPY WITH PROPOFOL: SHX5780

## 2019-12-25 HISTORY — DX: Unspecified osteoarthritis, unspecified site: M19.90

## 2019-12-25 HISTORY — PX: POLYPECTOMY: SHX5525

## 2019-12-25 LAB — GLUCOSE, CAPILLARY
Glucose-Capillary: 140 mg/dL — ABNORMAL HIGH (ref 70–99)
Glucose-Capillary: 110 mg/dL — ABNORMAL HIGH (ref 70–99)

## 2019-12-25 LAB — PSA: Prostate Specific Ag, Serum: 16.3 ng/mL — ABNORMAL HIGH (ref 0.0–4.0)

## 2019-12-25 SURGERY — COLONOSCOPY WITH PROPOFOL
Anesthesia: General | Site: Rectum

## 2019-12-25 MED ORDER — LACTATED RINGERS IV SOLN: INTRAVENOUS | Status: DC | PRN

## 2019-12-25 MED ORDER — STERILE WATER FOR IRRIGATION IR SOLN
Status: DC | PRN
  Administered 2019-12-25: 100 mL

## 2019-12-25 MED ORDER — PROPOFOL 10 MG/ML IV BOLUS
INTRAVENOUS | Status: DC | PRN
  Administered 2019-12-25: 30 mg via INTRAVENOUS
  Administered 2019-12-25 (×4): 20 mg via INTRAVENOUS
  Administered 2019-12-25: 30 mg via INTRAVENOUS
  Administered 2019-12-25 (×4): 20 mg via INTRAVENOUS
  Administered 2019-12-25: 30 mg via INTRAVENOUS
  Administered 2019-12-25: 20 mg via INTRAVENOUS
  Administered 2019-12-25: 30 mg via INTRAVENOUS
  Administered 2019-12-25 (×4): 20 mg via INTRAVENOUS
  Administered 2019-12-25: 70 mg via INTRAVENOUS

## 2019-12-25 MED ORDER — LIDOCAINE HCL (CARDIAC) PF 100 MG/5ML IV SOSY
PREFILLED_SYRINGE | INTRAVENOUS | Status: DC | PRN
  Administered 2019-12-25: 50 mg via INTRAVENOUS

## 2019-12-25 SURGICAL SUPPLY — 25 items
CANISTER SUCT 1200ML W/VALVE (MISCELLANEOUS) ×3 IMPLANT
CLIP HMST 235XBRD CATH ROT (MISCELLANEOUS) IMPLANT
CLIP RESOLUTION 360 11X235 (MISCELLANEOUS)
ELECT REM PT RETURN 9FT ADLT (ELECTROSURGICAL) ×3
ELECTRODE REM PT RTRN 9FT ADLT (ELECTROSURGICAL) ×1 IMPLANT
FCP ESCP3.2XJMB 240X2.8X (MISCELLANEOUS) ×1
FORCEPS BIOP RAD 4 LRG CAP 4 (CUTTING FORCEPS) IMPLANT
FORCEPS BIOP RJ4 240 W/NDL (MISCELLANEOUS) ×2
FORCEPS ESCP3.2XJMB 240X2.8X (MISCELLANEOUS) ×1 IMPLANT
GOWN CVR UNV OPN BCK APRN NK (MISCELLANEOUS) ×2 IMPLANT
GOWN ISOL THUMB LOOP REG UNIV (MISCELLANEOUS) ×4
INJECTOR VARIJECT VIN23 (MISCELLANEOUS) IMPLANT
KIT DEFENDO VALVE AND CONN (KITS) IMPLANT
KIT ENDO PROCEDURE OLY (KITS) ×3 IMPLANT
MARKER SPOT ENDO TATTOO 5ML (MISCELLANEOUS) IMPLANT
PROBE APC STR FIRE (PROBE) ×3 IMPLANT
RETRIEVER NET ROTH 2.5X230 LF (MISCELLANEOUS) IMPLANT
SNARE COLD EXACTO (MISCELLANEOUS) ×3 IMPLANT
SNARE SHORT THROW 13M SML OVAL (MISCELLANEOUS) ×3 IMPLANT
SNARE SHORT THROW 30M LRG OVAL (MISCELLANEOUS) IMPLANT
SNARE SNG USE RND 15MM (INSTRUMENTS) IMPLANT
SPOT EX ENDOSCOPIC TATTOO (MISCELLANEOUS)
TRAP ETRAP POLY (MISCELLANEOUS) ×3 IMPLANT
VARIJECT INJECTOR VIN23 (MISCELLANEOUS)
WATER STERILE IRR 250ML POUR (IV SOLUTION) ×3 IMPLANT

## 2019-12-25 NOTE — Anesthesia Preprocedure Evaluation (Signed)
Anesthesia Evaluation  Patient identified by MRN, date of birth, ID band Patient awake    Reviewed: Allergy & Precautions, H&P , NPO status , Patient's Chart, lab work & pertinent test results, reviewed documented beta blocker date and time   Airway Mallampati: II  TM Distance: >3 FB Neck ROM: full    Dental no notable dental hx. (+) Upper Dentures   Pulmonary former smoker (90 pack years),    Pulmonary exam normal breath sounds clear to auscultation       Cardiovascular Exercise Tolerance: Good hypertension,  Rhythm:regular Rate:Normal     Neuro/Psych negative neurological ROS  negative psych ROS   GI/Hepatic negative GI ROS, Neg liver ROS,   Endo/Other  diabetes, Type 2  Renal/GU negative Renal ROS  negative genitourinary   Musculoskeletal   Abdominal   Peds  Hematology negative hematology ROS (+)   Anesthesia Other Findings   Reproductive/Obstetrics negative OB ROS                             Anesthesia Physical Anesthesia Plan  ASA: II  Anesthesia Plan: General   Post-op Pain Management:    Induction:   PONV Risk Score and Plan: 2 and Propofol infusion and TIVA  Airway Management Planned:   Additional Equipment:   Intra-op Plan:   Post-operative Plan:   Informed Consent: I have reviewed the patients History and Physical, chart, labs and discussed the procedure including the risks, benefits and alternatives for the proposed anesthesia with the patient or authorized representative who has indicated his/her understanding and acceptance.     Dental Advisory Given  Plan Discussed with: CRNA  Anesthesia Plan Comments:         Anesthesia Quick Evaluation

## 2019-12-25 NOTE — Transfer of Care (Signed)
Immediate Anesthesia Transfer of Care Note  Patient: Nicholas Nolan  Procedure(s) Performed: COLONOSCOPY WITH PROPOFOL (N/A Rectum) POLYPECTOMY (N/A Rectum)  Patient Location: PACU  Anesthesia Type: General  Level of Consciousness: awake, alert  and patient cooperative  Airway and Oxygen Therapy: Patient Spontanous Breathing and Patient connected to supplemental oxygen  Post-op Assessment: Post-op Vital signs reviewed, Patient's Cardiovascular Status Stable, Respiratory Function Stable, Patent Airway and No signs of Nausea or vomiting  Post-op Vital Signs: Reviewed and stable  Complications: No apparent anesthesia complications

## 2019-12-25 NOTE — H&P (Signed)
Cephas Darby, MD [address]  [place], [place] [address]  Main: [phone]  Fax: [phone] Pager: [phone]  Primary Care Physician:  Mar Daring, PA-C Primary Gastroenterologist:  Dr. Cephas Darby  Pre-Procedure History & Physical: HPI:  Nicholas Nolan is a 69 y.o. male is here for an colonoscopy.   Past Medical History:  Diagnosis Date  . Arthritis    hands  . Back pain    leg pain  . Diabetes mellitus    type 2  . History of urinary retention   . Hyperlipidemia   . Hypertension   . Neuropathy    feet  . Primary cancer of right upper lobe of lung ([place]) 2014   RUL Lobectomy  . Wears dentures    full upper    Past Surgical History:  Procedure Laterality Date  . BASAL CELL CARCINOMA EXCISION    . LUMBAR LAMINECTOMY/DECOMPRESSION MICRODISCECTOMY  01/10/2012   Procedure: LUMBAR LAMINECTOMY/DECOMPRESSION MICRODISCECTOMY 2 LEVELS;  Surgeon: Hosie Spangle, MD;  Location: [facility];  Service: Neurosurgery;  Laterality: Bilateral;  Lumbar four-Sacral One laminectomies  . LUNG SURGERY Right 11/22/12   right upper lobe  . PROSTATE SURGERY     biopsy-due to elelvated PSA  . RENAL ANGIOGRAPHY Right 07/02/2019   Procedure: RENAL ANGIOGRAPHY;  Surgeon: Algernon Huxley, MD;  Location: [facility];  Service: Cardiovascular;  Laterality: Right;  . SHOULDER SURGERY    . VASECTOMY      Prior to Admission medications   Medication Sig Start Date End Date Taking? Authorizing Provider  amLODipine (NORVASC) 10 MG tablet Take 1 tablet (10 mg total) by mouth daily. 07/24/19 12/19/19 Yes Agbor-Etang, Aaron Edelman, MD  atorvastatin (LIPITOR) 80 MG tablet Take 1 tablet (80 mg total) by mouth daily. 10/15/19 01/13/20 Yes Dunn, Areta Haber, PA-C  cloNIDine (CATAPRES) 0.1 MG tablet Take 1 tablet (0.1 mg total) by mouth at bedtime. 10/19/19  Yes Dunn, Areta Haber, PA-C  Dulaglutide (TRULICITY) 1.5 QB/1.6XI SOPN Inject 1.5 mg into the skin once a week.  12/17/15  Yes  [provider]  FARXIGA 10 MG TABS tablet Take 10 mg by mouth every morning. 08/31/19  Yes [provider]  glimepiride (AMARYL) 4 MG tablet Take 0.5 tablets (2 mg total) by mouth daily. 10/01/19  Yes Fenton Malling M, PA-C  metFORMIN (GLUCOPHAGE) 1000 MG tablet Take 1 tablet (1,000 mg total) by mouth daily with breakfast. 10/01/19  Yes Burnette, Clearnce Sorrel, PA-C  nitroGLYCERIN (NITROSTAT) 0.4 MG SL tablet Place 1 tablet (0.4 mg total) under the tongue every 5 (five) minutes as needed for chest pain. 10/19/19 01/17/20 Yes Dunn, Areta Haber, PA-C  tamsulosin (FLOMAX) 0.4 MG CAPS capsule Take 1 capsule (0.4 mg total) by mouth daily after supper. 10/09/19  Yes Festus Aloe, MD  verapamil (CALAN-SR) 240 MG CR tablet Take 1 tablet (240 mg total) by mouth at bedtime. 10/01/19  Yes Fenton Malling M, PA-C  glucose blood (ONETOUCH VERIO) test strip USE ONCE DAILY. USE AS INSTRUCTED. 06/29/18   [provider]  metFORMIN (GLUCOPHAGE) 1000 MG tablet TAKE ONE TABLET BY MOUTH TWICE A DAY WITH A MEAL 03/14/18   Mar Daring, PA-C    Allergies as of 12/06/2019 - Review Complete 12/06/2019  Allergen Reaction Noted  . Codeine Itching 01/10/2012  . Hydromorphone Itching 05/07/2016  . Morphine and related Itching 05/07/2016    Family History  Problem Relation Age of Onset  . Cancer Father  prostate  . Heart disease Father        MI  . Stroke Father   . Cancer Brother        liver    Social History   Socioeconomic History  . Marital status: Married    Spouse name: Not on file  . Number of children: 4  . Years of education: Not on file  . Highest education level: Associate degree: occupational, Hotel manager, or vocational program  Occupational History  . Occupation: retired  Tobacco Use  . Smoking status: Former Smoker    Packs/day: 2.00    Years: 45.00    Pack years: 90.00    Types: Cigarettes    Quit date: 11/28/2012    Years since quitting: 7.0  .  Smokeless tobacco: Current User    Types: Snuff  . Tobacco comment: Quit in 2014  Substance and Sexual Activity  . Alcohol use: No  . Drug use: No  . Sexual activity: Yes    Birth control/protection: None  Other Topics Concern  . Not on file  Social History Narrative  . Not on file   Social Determinants of Health   Financial Resource Strain: Low Risk   . Difficulty of Paying Living Expenses: Not hard at all  Food Insecurity: No Food Insecurity  . Worried About Charity fundraiser in the Last Year: Never true  . Ran Out of Food in the Last Year: Never true  Transportation Needs: No Transportation Needs  . Lack of Transportation (Medical): No  . Lack of Transportation (Non-Medical): No  Physical Activity: Inactive  . Days of Exercise per Week: 0 days  . Minutes of Exercise per Session: 0 min  Stress: No Stress Concern Present  . Feeling of Stress : Not at all  Social Connections: Slightly Isolated  . Frequency of Communication with Friends and Family: More than three times a week  . Frequency of Social Gatherings with Friends and Family: More than three times a week  . Attends Religious Services: More than 4 times per year  . Active Member of Clubs or Organizations: No  . Attends Archivist Meetings: Never  . Marital Status: Married  Human resources officer Violence: Not At Risk  . Fear of Current or Ex-Partner: No  . Emotionally Abused: No  . Physically Abused: No  . Sexually Abused: No    Review of Systems: See HPI, otherwise negative ROS  Physical Exam: BP (!) 157/51   Pulse 63   Temp 98.1 F (36.7 C)   Ht 5\' 10"  (1.778 m)   Wt 94.8 kg   SpO2 100%   BMI 29.99 kg/m  General:   Alert,  pleasant and cooperative in NAD Head:  Normocephalic and atraumatic. Neck:  Supple; no masses or thyromegaly. Lungs:  Clear throughout to auscultation.    Heart:  Regular rate and rhythm. Abdomen:  Soft, nontender and nondistended. Normal bowel sounds, without guarding, and  without rebound.   Neurologic:  Alert and  oriented x4;  grossly normal neurologically.  Impression/Plan: Nicholas Nolan is here for an colonoscopy to be performed for colon cancer screening  Risks, benefits, limitations, and alternatives regarding  colonoscopy have been reviewed with the patient.  Questions have been answered.  All parties agreeable.   Sherri Sear, MD  12/25/2019, 7:44 AM

## 2019-12-25 NOTE — Op Note (Signed)
[facility] Gastroenterology Patient Name: Nicholas Nolan Procedure Date: 12/25/2019 8:24 AM MRN: [id number] Account #: [id number] Date of Birth: 22-May-1951 Admit Type: Outpatient Age: 69 Room: [facility] OR ROOM 01 Gender: Male Note Status: Finalized Procedure:             Colonoscopy Indications:           Screening for colorectal malignant neoplasm, This is                         the patient's first colonoscopy Providers:             Lin Landsman MD, MD Referring MD:          Mar Daring (Referring MD) Medicines:             Monitored Anesthesia Care Complications:         No immediate complications. Estimated blood loss: None. Procedure:             Pre-Anesthesia Assessment:                        - Prior to the procedure, a History and Physical was                         performed, and patient medications and allergies were                         reviewed. The patient is competent. The risks and                         benefits of the procedure and the sedation options and                         risks were discussed with the patient. All questions                         were answered and informed consent was obtained.                         Patient identification and proposed procedure were                         verified by the physician, the nurse, the                         anesthesiologist, the anesthetist and the technician                         in the pre-procedure area in the procedure room in the                         endoscopy suite. Mental Status Examination: alert and                         oriented. Airway Examination: normal oropharyngeal                         airway and neck mobility. Respiratory Examination:  clear to auscultation. CV Examination: normal.                         Prophylactic Antibiotics: The patient does not require                         prophylactic antibiotics. Prior  Anticoagulants: The                         patient has taken no previous anticoagulant or                         antiplatelet agents. ASA Grade Assessment: II - A                         patient with mild systemic disease. After reviewing                         the risks and benefits, the patient was deemed in                         satisfactory condition to undergo the procedure. The                         anesthesia plan was to use monitored anesthesia care                         (MAC). Immediately prior to administration of                         medications, the patient was re-assessed for adequacy                         to receive sedatives. The heart rate, respiratory                         rate, oxygen saturations, blood pressure, adequacy of                         pulmonary ventilation, and response to care were                         monitored throughout the procedure. The physical                         status of the patient was re-assessed after the                         procedure.                        After obtaining informed consent, the colonoscope was                         passed under direct vision. Throughout the procedure,                         the patient's blood pressure, pulse, and oxygen  saturations were monitored continuously. The was                         introduced through the anus and advanced to the the                         cecum, identified by appendiceal orifice and ileocecal                         valve. The colonoscopy was unusually difficult due to                         inadequate bowel prep, significant looping and the                         patient's body habitus. Successful completion of the                         procedure was aided by applying abdominal pressure.                         The patient tolerated the procedure well. The quality                         of the bowel preparation was evaluated  using the BBPS                         [facility] Bowel Preparation Scale) with scores of: Right                         Colon = 2 (minor amount of residual staining, small                         fragments of stool and/or opaque liquid, but mucosa                         seen well), Transverse Colon = 2 (minor amount of                         residual staining, small fragments of stool and/or                         opaque liquid, but mucosa seen well) and Left Colon =                         2 (minor amount of residual staining, small fragments                         of stool and/or opaque liquid, but mucosa seen well).                         The total BBPS score equals 6. Findings:      The perianal and digital rectal examinations were normal. Pertinent       negatives include normal sphincter tone and no palpable rectal lesions.      A 4 mm polyp was found in the ascending colon. The polyp was  sessile.       The polyp was removed with a cold snare. Resection and retrieval were       complete.      A 3 mm polyp was found in the ascending colon. The polyp was sessile.       The polyp was removed with a cold biopsy forceps. Resection and       retrieval were complete.      Two sessile polyps were found in the transverse colon. The polyps were 3       to 5 mm in size. These polyps were removed with a cold snare. Resection       and retrieval were complete.      A 7 mm polyp was found in the descending colon. The polyp was sessile.       The polyp was removed with a hot snare. Resection and retrieval were       complete.      Three sessile polyps were found in the rectum. The polyps were 3 to 4 mm       in size. These polyps were removed with a cold snare. Resection and       retrieval were complete.      Non-bleeding internal hemorrhoids were found during retroflexion. The       hemorrhoids were large. Impression:            - One 4 mm polyp in the ascending colon, removed with                          a cold snare. Resected and retrieved.                        - One 3 mm polyp in the ascending colon, removed with                         a cold biopsy forceps. Resected and retrieved.                        - Two 3 to 5 mm polyps in the transverse colon,                         removed with a cold snare. Resected and retrieved.                        - One 7 mm polyp in the descending colon, removed with                         a hot snare. Resected and retrieved.                        - Three 3 to 4 mm polyps in the rectum, removed with a                         cold snare. Resected and retrieved.                        - Non-bleeding internal hemorrhoids. Recommendation:        - Discharge patient to home (with escort).                        -  Resume previous diet today.                        - Continue present medications.                        - Await pathology results.                        - Repeat colonoscopy in 1 year for surveillance due to                         fair prep. Procedure Code(s):     --- Professional ---                        [phone], Colonoscopy, flexible; with removal of                         tumor(s), polyp(s), or other lesion(s) by snare                         technique                        45380, 79, Colonoscopy, flexible; with biopsy, single                         or multiple Diagnosis Code(s):     --- Professional ---                        Z12.11, Encounter for screening for malignant neoplasm                         of colon                        K63.5, Polyp of colon                        K62.1, Rectal polyp                        K64.8, Other hemorrhoids CPT copyright 2019 American Medical Association. All rights reserved. The codes documented in this report are preliminary and upon coder review may  be revised to meet current compliance requirements. Dr. Ulyess Mort Lin Landsman MD, MD 12/25/2019 9:34:21 AM This  report has been signed electronically. Number of Addenda: 0 Note Initiated On: 12/25/2019 8:24 AM Scope Withdrawal Time: 0 hours 41 minutes 12 seconds  Total Procedure Duration: 0 hours 48 minutes 21 seconds  Estimated Blood Loss:  Estimated blood loss: none.      [facility]

## 2019-12-25 NOTE — Telephone Encounter (Signed)
Repeat PSA remains elevated at 16.3.  Recommend scheduling a prostate MRI.  Order was entered.

## 2019-12-25 NOTE — Anesthesia Postprocedure Evaluation (Signed)
Anesthesia Post Note  Patient: Nicholas Nolan  Procedure(s) Performed: COLONOSCOPY WITH PROPOFOL (N/A Rectum) POLYPECTOMY (N/A Rectum)     Patient location during evaluation: PACU Anesthesia Type: General Level of consciousness: awake and alert Pain management: pain level controlled Vital Signs Assessment: post-procedure vital signs reviewed and stable Respiratory status: spontaneous breathing, nonlabored ventilation, respiratory function stable and patient connected to nasal cannula oxygen Cardiovascular status: blood pressure returned to baseline and stable Postop Assessment: no apparent nausea or vomiting Anesthetic complications: no    Alisa Graff

## 2019-12-25 NOTE — Anesthesia Procedure Notes (Signed)
Performed by: Brittin Janik, Car[place], CRNA Pre-anesthesia Checklist: Patient identified, Emergency Drugs available, Suction available, Timeout performed and Patient being monitored Patient Re-evaluated:Patient Re-evaluated prior to induction Oxygen Delivery Method: Nasal cannula Placement Confirmation: positive ETCO2       

## 2019-12-26 ENCOUNTER — Encounter: Payer: Self-pay | Admitting: *Deleted

## 2019-12-26 NOTE — Telephone Encounter (Signed)
Notified patient as instructed, patient pleased. Discussed follow-up appointments, patient agrees  

## 2019-12-27 ENCOUNTER — Encounter: Payer: Self-pay | Admitting: Gastroenterology

## 2019-12-27 LAB — SURGICAL PATHOLOGY

## 2020-01-10 ENCOUNTER — Ambulatory Visit
Admission: RE | Admit: 2020-01-10 | Discharge: 2020-01-10 | Disposition: A | Discharge status: Home / Self Care | Payer: Medicare PPO | Source: Ambulatory Visit | Attending: Urology | Admitting: Urology

## 2020-01-10 ENCOUNTER — Other Ambulatory Visit: Payer: Self-pay

## 2020-01-10 ENCOUNTER — Other Ambulatory Visit: Payer: Self-pay | Admitting: Urology

## 2020-01-10 DIAGNOSIS — R972 Elevated prostate specific antigen [PSA]: Secondary | ICD-10-CM | POA: Insufficient documentation

## 2020-01-10 LAB — POCT I-STAT CREATININE: Creatinine, Ser: 2.3 mg/dL — ABNORMAL HIGH (ref 0.61–1.24)

## 2020-01-11 ENCOUNTER — Ambulatory Visit: Payer: Medicare PPO

## 2020-01-11 DIAGNOSIS — G4733 Obstructive sleep apnea (adult) (pediatric): Secondary | ICD-10-CM

## 2020-01-13 DIAGNOSIS — G4733 Obstructive sleep apnea (adult) (pediatric): Secondary | ICD-10-CM

## 2020-01-15 ENCOUNTER — Telehealth: Payer: Self-pay | Admitting: Radiology

## 2020-01-15 ENCOUNTER — Other Ambulatory Visit: Payer: Self-pay | Admitting: *Deleted

## 2020-01-15 MED ORDER — TAMSULOSIN HCL 0.4 MG PO CAPS: 0.4000 mg | ORAL_CAPSULE | Freq: Every day | ORAL | 3 refills | Status: DC

## 2020-01-15 NOTE — Telephone Encounter (Signed)
patient requests refill of tamsulosin.

## 2020-01-18 ENCOUNTER — Telehealth: Payer: Self-pay | Admitting: Pulmonary Disease

## 2020-01-18 ENCOUNTER — Other Ambulatory Visit: Payer: Self-pay

## 2020-01-18 ENCOUNTER — Encounter: Payer: Self-pay | Admitting: Urology

## 2020-01-18 ENCOUNTER — Ambulatory Visit: Payer: Medicare PPO | Admitting: Urology

## 2020-01-18 VITALS — BP 144/58 | HR 73 | Ht 70.0 in | Wt 212.0 lb

## 2020-01-18 DIAGNOSIS — R972 Elevated prostate specific antigen [PSA]: Secondary | ICD-10-CM | POA: Diagnosis not present

## 2020-01-18 DIAGNOSIS — R935 Abnormal findings on diagnostic imaging of other abdominal regions, including retroperitoneum: Secondary | ICD-10-CM | POA: Diagnosis not present

## 2020-01-18 DIAGNOSIS — G4733 Obstructive sleep apnea (adult) (pediatric): Secondary | ICD-10-CM

## 2020-01-18 NOTE — Telephone Encounter (Signed)
Home sleep study from 01/13/20 showed moderate obstructive sleep apnea with AHI of 20.8 and SpO2 low of 84%.   Will forward results to Dr. Mortimer Fries.

## 2020-01-18 NOTE — Progress Notes (Signed)
01/18/2020 2:02 PM   Nicholas Nolan 07-Jun-1951 [id number]  Referring provider: Mar Daring, PA-C [address] [place],  [place] [address]  Chief Complaint  Patient presents with  . Follow-up    Urologic history: 1.  BPH with urinary retention -Episode urinary retention 11/25; inpatient consult Dr. Junious Nolan  2.  Elevated PSA -Prostate biopsy 2010 PSA 5.4; volume 64 cc; benign -PSA 8.3 05/2016; MRI 103 cc volume, PI-RADS 3  -MR fusion biopsy 08/2016, 82 cc volume, ROI and 12 core template benign   HPI: 69 y.o. male presents for follow-up prostate MRI.  -Visit 10/2019 PSA had increased to 16.8 -Due to recent catheter placement for urinary retention around that time PSA repeated 12/2019 and remained elevated at 16.3 -Prostate MRI performed 3/4 shows prostate volume 128 cc (11/11/2015) not given contrast secondary to GFR 28 -Noncontrast scan did show three "intermediate suspicious lesions"; one in the posteromedial left PZ associated with mild capsular bulging (1.3 x 0.6 x 0.7 cm) -Two other lesions in the TZ which were present on prior MRI 2017   PMH: Past Medical History:  Diagnosis Date  . Arthritis    hands  . Back pain    leg pain  . Diabetes mellitus    type 2  . History of urinary retention   . Hyperlipidemia   . Hypertension   . Neuropathy    feet  . Primary cancer of right upper lobe of lung ([place]) 2014   RUL Lobectomy  . Wears dentures    full upper    Surgical History: Past Surgical History:  Procedure Laterality Date  . BASAL CELL CARCINOMA EXCISION    . COLONOSCOPY WITH PROPOFOL N/A 12/25/2019   Procedure: COLONOSCOPY WITH PROPOFOL;  Surgeon: Nicholas Landsman, MD;  Location: [facility];  Service: Endoscopy;  Laterality: N/A;  Diabetic - injectable and oral meds  . LUMBAR LAMINECTOMY/DECOMPRESSION MICRODISCECTOMY  01/10/2012   Procedure: LUMBAR LAMINECTOMY/DECOMPRESSION MICRODISCECTOMY 2 LEVELS;  Surgeon: Nicholas Spangle, MD;  Location: [facility];  Service: Neurosurgery;  Laterality: Bilateral;  Lumbar four-Sacral One laminectomies  . LUNG SURGERY Right 11/22/12   right upper lobe  . POLYPECTOMY N/A 12/25/2019   Procedure: POLYPECTOMY;  Surgeon: Nicholas Landsman, MD;  Location: [facility];  Service: Endoscopy;  Laterality: N/A;  . PROSTATE SURGERY     biopsy-due to elelvated PSA  . RENAL ANGIOGRAPHY Right 07/02/2019   Procedure: RENAL ANGIOGRAPHY;  Surgeon: Nicholas Huxley, MD;  Location: [facility];  Service: Cardiovascular;  Laterality: Right;  . SHOULDER SURGERY    . VASECTOMY      Home Medications:  Allergies as of 01/18/2020      Reactions   Codeine Itching   And hyper also   Hydromorphone Itching   Morphine And Related Itching      Medication List       Accurate as of January 18, 2020  2:02 PM. If you have any questions, ask your nurse or doctor.        amLODipine 10 MG tablet Commonly known as: NORVASC Take 1 tablet (10 mg total) by mouth daily.   atorvastatin 80 MG tablet Commonly known as: LIPITOR Take 1 tablet (80 mg total) by mouth daily.   cloNIDine 0.1 MG tablet Commonly known as: CATAPRES Take 1 tablet (0.1 mg total) by mouth at bedtime.   Farxiga 10 MG Tabs tablet Generic drug: dapagliflozin propanediol Take 10 mg by mouth every morning.  glimepiride 4 MG tablet Commonly known as: AMARYL Take 0.5 tablets (2 mg total) by mouth daily.   metFORMIN 1000 MG tablet Commonly known as: GLUCOPHAGE Take 1 tablet (1,000 mg total) by mouth daily with breakfast.   nitroGLYCERIN 0.4 MG SL tablet Commonly known as: NITROSTAT Place 1 tablet (0.4 mg total) under the tongue every 5 (five) minutes as needed for chest pain.   OneTouch Verio test strip Generic drug: glucose blood USE ONCE DAILY. USE AS INSTRUCTED.   tamsulosin 0.4 MG Caps capsule Commonly known as: FLOMAX Take 1 capsule (0.4 mg total) by mouth daily after supper.   Trulicity 1.5  ZL/9.3TT Sopn Generic drug: Dulaglutide Inject 1.5 mg into the skin once a week.   verapamil 240 MG CR tablet Commonly known as: CALAN-SR Take 1 tablet (240 mg total) by mouth at bedtime.       Allergies:  Allergies  Allergen Reactions  . Codeine Itching    And hyper also  . Hydromorphone Itching  . Morphine And Related Itching    Family History: Family History  Problem Relation Age of Onset  . Cancer Father        prostate  . Heart disease Father        MI  . Stroke Father   . Cancer Brother        liver    Social History:  reports that he quit smoking about 7 years ago. His smoking use included cigarettes. He has a 90.00 pack-year smoking history. His smokeless tobacco use includes snuff. He reports that he does not drink alcohol or use drugs.   Physical Exam: BP (!) 144/58   Pulse 73   Ht 5\' 10"  (1.778 m)   Wt 212 lb (96.2 kg)   BMI 30.42 kg/m   Constitutional:  Alert and oriented, No acute distress.    Assessment & Plan:    - Elevated PSA I touched base with Dr. Alinda Money at [facility] and he thought these areas could be targeted especially the PZ lesion with capsular bulging.  Will set him up for fusion biopsy in [place].  Discussed with Nicholas Nolan who agrees.  Procedure was discussed occluding potential risks of bleeding and infection/sepsis.   Nicholas Nolan, [place] [address] [place], [place] [address] [phone]

## 2020-01-20 ENCOUNTER — Encounter: Payer: Self-pay | Admitting: Urology

## 2020-01-22 NOTE — Telephone Encounter (Signed)
Please start AUTOCPAP 5-15 cm h20

## 2020-01-23 NOTE — Telephone Encounter (Signed)
Spoke with the pt and notified of results and CPAP was ordered  2 month reminded for f/u placed

## 2020-02-12 ENCOUNTER — Other Ambulatory Visit: Payer: Self-pay

## 2020-02-18 ENCOUNTER — Other Ambulatory Visit: Payer: Self-pay | Admitting: Urology

## 2020-02-27 ENCOUNTER — Telehealth (INDEPENDENT_AMBULATORY_CARE_PROVIDER_SITE_OTHER): Payer: Medicare PPO | Admitting: Urology

## 2020-02-27 ENCOUNTER — Telehealth: Payer: Self-pay | Admitting: Urology

## 2020-02-27 ENCOUNTER — Other Ambulatory Visit: Payer: Self-pay

## 2020-02-27 ENCOUNTER — Encounter: Payer: Self-pay | Admitting: Urology

## 2020-02-27 DIAGNOSIS — R972 Elevated prostate specific antigen [PSA]: Secondary | ICD-10-CM

## 2020-02-27 NOTE — Telephone Encounter (Signed)
-----   Message from Abbie Sons, MD sent at 02/27/2020  8:25 AM EDT ----- Regarding: Follow-up Please schedule 71-month follow-up visit with PSA

## 2020-02-27 NOTE — Telephone Encounter (Signed)
APP MADE AND MAILED

## 2020-02-27 NOTE — Progress Notes (Signed)
Virtual Visit via Telephone Note  I connected with Nicholas Nolan on 02/27/20 at  8:00 AM EDT by telephone and verified that I am speaking with the correct person using two identifiers.  Location: Patient: Home Provider: Office   I discussed the limitations, risks, security and privacy concerns of performing an evaluation and management service by telephone and the availability of in person appointments. I also discussed with the patient that there may be a patient responsible charge related to this service. The patient expressed understanding and agreed to proceed.   History of Present Illness: Nicholas Nolan underwent MR fusion biopsy.  Referral 02/14/2020.  Prostate volume was 149 g.  He had no post biopsy problems.  4 biopsies each were taken of 3 ROI lesions in addition to a standard 12 core biopsy.  Pathology: All cores showed benign prostate tissue somewhat foci of acute and chronic inflammation.   Observations/Objective: N/A  Assessment and Plan: -Elevated PSA with benign prostate biopsy most likely secondary to BPH and inflammation  -Significant prostate volume.  Stable lower urinary tract symptoms on tamsulosin.  Not interested in outlet procedures at this time.  Discussed the addition of finasteride.  Risk and benefits were discussed including the possibility of finasteride withdrawal syndrome.  He would like to think over these options.   Follow Up Instructions: -6-month follow-up with PSA   I discussed the assessment and treatment plan with the patient. The patient was provided an opportunity to ask questions and all were answered. The patient agreed with the plan and demonstrated an understanding of the instructions.   The patient was advised to call back or seek an in-person evaluation if the symptoms worsen or if the condition fails to improve as anticipated.  I provided 12 minutes of non-face-to-face time during this encounter.   Abbie Sons, MD

## 2020-03-19 LAB — HEMOGLOBIN A1C: Hemoglobin A1C: 7

## 2020-04-08 ENCOUNTER — Other Ambulatory Visit: Payer: Self-pay | Admitting: Urology

## 2020-04-12 ENCOUNTER — Other Ambulatory Visit: Payer: Self-pay | Admitting: Physician Assistant

## 2020-04-12 DIAGNOSIS — E119 Type 2 diabetes mellitus without complications: Secondary | ICD-10-CM

## 2020-04-12 NOTE — Telephone Encounter (Signed)
Requested Prescriptions  Pending Prescriptions Disp Refills  . metFORMIN (GLUCOPHAGE) 1000 MG tablet [Pharmacy Med Name: METFORMIN HCL 1,000 MG TABLET] 180 tablet 0    Sig: TAKE ONE TABLET BY MOUTH TWICE A DAY WITH A MEAL     Endocrinology:  Diabetes - Biguanides Failed - 04/12/2020  9:35 AM      Failed - Cr in normal range and within 360 days    Creatinine, Ser  Date Value Ref Range Status  01/10/2020 2.30 (H) 0.61 - 1.24 mg/dL Final         Failed - HBA1C is between 0 and 7.9 and within 180 days    Hemoglobin A1C  Date Value Ref Range Status  08/10/2019 6.9  Final    Comment:    care everywhere         Failed - eGFR in normal range and within 360 days    GFR calc Af Amer  Date Value Ref Range Status  10/19/2019 51 (L) >59 mL/min/1.73 Final   GFR calc non Af Amer  Date Value Ref Range Status  10/19/2019 44 (L) >59 mL/min/1.73 Final         Passed - Valid encounter within last 6 months    Recent Outpatient Visits          6 months ago Resistant hypertension   [place], [place], [place]   7 months ago [place], Dionne Bucy, [place]   9 months ago Resistant hypertension   [place], Clearnce Sorrel, [place]   1 year ago Essential hypertension   [place], Clearnce Sorrel, [place]   2 years ago Insect bite of right foot, initial encounter   [place], Herbie [place], [place]      Future Appointments            In 4 months [place], Ronda Fairly, MD [place]

## 2020-05-01 ENCOUNTER — Other Ambulatory Visit: Payer: Self-pay | Admitting: Physician Assistant

## 2020-05-01 DIAGNOSIS — I10 Essential (primary) hypertension: Secondary | ICD-10-CM

## 2020-05-27 ENCOUNTER — Other Ambulatory Visit: Payer: Self-pay | Admitting: Physician Assistant

## 2020-05-27 DIAGNOSIS — I10 Essential (primary) hypertension: Secondary | ICD-10-CM

## 2020-05-27 NOTE — Telephone Encounter (Signed)
Patient called and advised it's noted to schedule an OV before next refill, he verbalized understanding. Appointment scheduled for Friday, 05/30/20 at 1000 with Fenton Malling, PA-C. Patient says the last refill he received was for 90 days, so he has plenty of medication. The last refill sent on 05/01/20 #30 was not picked up.

## 2020-05-30 ENCOUNTER — Encounter: Payer: Self-pay | Admitting: Physician Assistant

## 2020-05-30 ENCOUNTER — Ambulatory Visit: Payer: Medicare PPO | Admitting: Physician Assistant

## 2020-05-30 ENCOUNTER — Other Ambulatory Visit: Payer: Self-pay

## 2020-05-30 VITALS — BP 165/68 | HR 66 | Temp 97.3°F | Wt 213.0 lb

## 2020-05-30 DIAGNOSIS — I10 Essential (primary) hypertension: Secondary | ICD-10-CM

## 2020-05-30 DIAGNOSIS — E785 Hyperlipidemia, unspecified: Secondary | ICD-10-CM

## 2020-05-30 DIAGNOSIS — E119 Type 2 diabetes mellitus without complications: Secondary | ICD-10-CM

## 2020-05-30 DIAGNOSIS — Z85118 Personal history of other malignant neoplasm of bronchus and lung: Secondary | ICD-10-CM

## 2020-05-30 DIAGNOSIS — G4733 Obstructive sleep apnea (adult) (pediatric): Secondary | ICD-10-CM | POA: Diagnosis not present

## 2020-05-30 DIAGNOSIS — N1832 Chronic kidney disease, stage 3b: Secondary | ICD-10-CM | POA: Insufficient documentation

## 2020-05-30 DIAGNOSIS — E669 Obesity, unspecified: Secondary | ICD-10-CM | POA: Insufficient documentation

## 2020-05-30 MED ORDER — ISOSORBIDE MONONITRATE ER 30 MG PO TB24: 30.0000 mg | ORAL_TABLET | Freq: Every day | ORAL | 6 refills | Status: DC

## 2020-05-30 MED ORDER — VERAPAMIL HCL ER 240 MG PO TBCR: 240.0000 mg | EXTENDED_RELEASE_TABLET | Freq: Every day | ORAL | 1 refills | Status: DC

## 2020-05-30 NOTE — Patient Instructions (Signed)

## 2020-05-30 NOTE — Assessment & Plan Note (Addendum)
Pt stopped Atorvastatin in 03/2020 after his endocrinology apt.  He states his kidney functions declined.  He thought Atorvastatin effected his kidneys.  Advised pt that statin usually does not effect kidneys.  Will check labs in August with Endo, and will likely restart his statin.   Will refer to Nephrology to evaluate and treat kidney functions.

## 2020-05-30 NOTE — Assessment & Plan Note (Signed)
Discussed importance of healthy weight management Discussed diet and exercise  

## 2020-05-30 NOTE — Assessment & Plan Note (Addendum)
Last A1C 7.0 on 03/2020.  Continue to follow with Endocrinology.  Advised pt he is due for an eye exam.

## 2020-05-30 NOTE — Progress Notes (Signed)
I,Laura E Walsh,acting as a Education administrator for [employer], PA-C.,have documented all relevant documentation on the behalf of Mar Daring, PA-C,as directed by  Mar Daring, PA-C while in the presence of Mar Daring, [place].   Established patient visit   Patient: Nicholas Nolan   DOB: [birth year]   69 y.o. Male  MRN: [id number] Visit Date: 05/30/2020  Today's healthcare provider: Mar Daring, PA-C   Chief Complaint  Patient presents with  . Hypertension   Subjective    HPI  Hypertension, follow-up  BP Readings from Last 3 Encounters:  05/30/20 (!) 165/68  01/18/20 (!) 144/58  12/25/19 (!) 177/65   Wt Readings from Last 3 Encounters:  05/30/20 (!) 213 lb (96.6 kg)  01/18/20 212 lb (96.2 kg)  12/25/19 209 lb (94.8 kg)     He was last seen for hypertension 7 months ago.  BP at that visit was 140/50.  Management since that visit includes:  Patient had stated he was not taking Olmesartan, but then was unsure. Advised him to continue olmesartan until the cardiologist was ready to possibly perform the other test (cortisol, aldosterone, 24 hr urine). Will change HCTZ to chorthalidone as below per cardiologist recommendation. Continue Verapamil ER 240mg . Decrease clonidine to 0.1mg  once daily. Has f/u with cardiology on 10/12/19.   He reports excellent compliance with treatment. He is not having side effects.  He is following a Regular diet. He is exercising. He does not smoke.  Use of agents associated with hypertension: none.   Outside blood pressures are checked occasionally at home.  Pt states it is usually around 140-150's/ 60-70's   Symptoms: No chest pain No chest pressure  No palpitations No syncope  No dyspnea No orthopnea  No paroxysmal nocturnal dyspnea No lower extremity edema   Pertinent labs: Lab Results  Component Value Date   CHOL 221 (H) 10/19/2019   HDL 43 10/19/2019   LDLCALC 150 (H) 10/19/2019   TRIG 153 (H)  10/19/2019   CHOLHDL 5.1 (H) 10/19/2019   Lab Results  Component Value Date   NA 140 10/19/2019   K 5.3 (H) 10/19/2019   CREATININE 2.30 (H) 01/10/2020   GFRNONAA 44 (L) 10/19/2019   GFRAA 51 (L) 10/19/2019   GLUCOSE 141 (H) 10/19/2019     The 10-year ASCVD risk score Mikey Bussing DC Jr., et al., 2013) is: 53.1%   --------------------------------------------------------------------------------------------------- Lipid/Cholesterol, Follow-up  Last lipid panel Other pertinent labs  Lab Results  Component Value Date   CHOL 221 (H) 10/19/2019   HDL 43 10/19/2019   LDLCALC 150 (H) 10/19/2019   TRIG 153 (H) 10/19/2019   CHOLHDL 5.1 (H) 10/19/2019   Lab Results  Component Value Date   ALT 10 10/19/2019   AST 12 10/19/2019   PLT 231 10/03/2019   TSH 0.678 05/13/2016     He was last seen for this 7 months ago.  Management since that visit includes Pt stopped taking atorvastatin in May after his appointment with endocrinology.  He states is kidney function declined and he thinks the atorvastatin is the cause of this.  Marland Kitchen  He reports poor compliance with treatment.  Symptoms: No chest pain No chest pressure/discomfort  No dyspnea No lower extremity edema  No numbness or tingling of extremity No orthopnea  No palpitations No paroxysmal nocturnal dyspnea  No speech difficulty No syncope   Current diet: in general, a "healthy" diet     The 10-year ASCVD risk score Mikey Bussing DC  Brooke Bonito., et al., 2013) is: 53.1%  ---------------------------------------------------------------------------------------------------   Patient Active Problem List   Diagnosis Date Noted  . OSA (obstructive sleep apnea) 05/30/2020  . Obesity (BMI 30.0-34.9) 05/30/2020  . Chronic kidney disease, stage 3b 05/30/2020  . Acute hyperkalemia   . Anemia   . Essential hypertension   . Basal cell carcinoma of skin 05/07/2016  . Benign prostatic hypertrophy with lower urinary tract symptoms (LUTS) 05/07/2016  . Abnormal  prostate specific antigen 05/07/2016  . ED (erectile dysfunction) of organic origin 05/07/2016  . Hyperthyroidism 05/07/2016  . LBP (low back pain) 05/07/2016  . Allergic rhinitis, seasonal 05/07/2016  . Compulsive tobacco user syndrome 05/07/2016  . Diabetes ([place]) 04/24/2015  . Resistant hypertension 04/24/2015  . Hyperlipemia 04/21/2015  . Controlled type 2 diabetes mellitus without complication, without long-term current use of insulin ([place]) 12/09/2014  . HLD (hyperlipidemia) 12/09/2014  . History of lung cancer 12/09/2014   Past Surgical History:  Procedure Laterality Date  . BASAL CELL CARCINOMA EXCISION    . COLONOSCOPY WITH PROPOFOL N/A 12/25/2019   Procedure: COLONOSCOPY WITH PROPOFOL;  Surgeon: Lin Landsman, MD;  Location: [facility];  Service: Endoscopy;  Laterality: N/A;  Diabetic - injectable and oral meds  . LUMBAR LAMINECTOMY/DECOMPRESSION MICRODISCECTOMY  01/10/2012   Procedure: LUMBAR LAMINECTOMY/DECOMPRESSION MICRODISCECTOMY 2 LEVELS;  Surgeon: Hosie Spangle, MD;  Location: [facility];  Service: Neurosurgery;  Laterality: Bilateral;  Lumbar four-Sacral One laminectomies  . LUNG SURGERY Right 11/22/12   right upper lobe  . POLYPECTOMY N/A 12/25/2019   Procedure: POLYPECTOMY;  Surgeon: Lin Landsman, MD;  Location: [facility];  Service: Endoscopy;  Laterality: N/A;  . PROSTATE SURGERY     biopsy-due to elelvated PSA  . RENAL ANGIOGRAPHY Right 07/02/2019   Procedure: RENAL ANGIOGRAPHY;  Surgeon: Algernon Huxley, MD;  Location: [facility];  Service: Cardiovascular;  Laterality: Right;  . SHOULDER SURGERY    . VASECTOMY     Social History   Tobacco Use  . Smoking status: Former Smoker    Packs/day: 2.00    Years: 45.00    Pack years: 90.00    Types: Cigarettes    Quit date: 11/28/2012    Years since quitting: 7.5  . Smokeless tobacco: Current User    Types: Snuff  . Tobacco comment: Quit in 2014  Vaping Use  . Vaping Use:  Never used  Substance Use Topics  . Alcohol use: No  . Drug use: No   Allergies  Allergen Reactions  . Codeine Itching    And hyper also  . Hydromorphone Itching  . Morphine And Related Itching     Medications: Outpatient Medications Prior to Visit  Medication Sig  . Dulaglutide (TRULICITY) 1.5 VQ/0.0QQ SOPN Inject 1.5 mg into the skin once a week.   Marland Kitchen FARXIGA 10 MG TABS tablet Take 10 mg by mouth every morning.  Marland Kitchen glimepiride (AMARYL) 4 MG tablet Take 0.5 tablets (2 mg total) by mouth daily.  Marland Kitchen glucose blood (ONETOUCH VERIO) test strip USE ONCE DAILY. USE AS INSTRUCTED.  Marland Kitchen metFORMIN (GLUCOPHAGE) 1000 MG tablet TAKE ONE TABLET BY MOUTH TWICE A DAY WITH A MEAL  . tamsulosin (FLOMAX) 0.4 MG CAPS capsule TAKE 1 CAPSULE (0.4 MG TOTAL) BY MOUTH DAILY AFTER SUPPER.  . [DISCONTINUED] verapamil (CALAN-SR) 240 MG CR tablet TAKE 1 TABLET BY MOUTH AT BEDTIME.  Marland Kitchen amLODipine (NORVASC) 10 MG tablet Take 1 tablet (10 mg total) by mouth daily.  Marland Kitchen atorvastatin (  LIPITOR) 80 MG tablet Take 1 tablet (80 mg total) by mouth daily.  . nitroGLYCERIN (NITROSTAT) 0.4 MG SL tablet Place 1 tablet (0.4 mg total) under the tongue every 5 (five) minutes as needed for chest pain.  . [DISCONTINUED] cloNIDine (CATAPRES) 0.1 MG tablet Take 1 tablet (0.1 mg total) by mouth at bedtime.   No facility-administered medications prior to visit.    Review of Systems  Constitutional: Negative.   Eyes: Negative for visual disturbance.  Respiratory: Negative.   Cardiovascular: Negative.   Gastrointestinal: Negative.   Endocrine: Negative.   Neurological: Negative for dizziness, light-headedness and headaches.      Objective    BP (!) 165/68 (BP Location: Left Arm, Patient Position: Sitting, Cuff Size: Large)   Pulse 66   Temp (!) 97.3 F (36.3 C) (Temporal)   Wt (!) 213 lb (96.6 kg)   BMI 30.56 kg/m    Physical Exam Constitutional:      General: He is not in acute distress.    Appearance: Normal  appearance. He is obese. He is not ill-appearing.  HENT:     Head: Normocephalic and atraumatic.  Neck:     Vascular: No carotid bruit.  Cardiovascular:     Rate and Rhythm: Normal rate and regular rhythm.     Pulses: Normal pulses.     Heart sounds: Normal heart sounds.  Pulmonary:     Effort: Pulmonary effort is normal.     Breath sounds: Normal breath sounds.  Abdominal:     General: Bowel sounds are normal.  Musculoskeletal:     Cervical back: Normal range of motion and neck supple. No tenderness.     Right lower leg: No edema.     Left lower leg: No edema.  Lymphadenopathy:     Cervical: No cervical adenopathy.  Skin:    General: Skin is warm and dry.  Neurological:     General: No focal deficit present.     Mental Status: He is alert and oriented to person, place, and time. Mental status is at baseline.  Psychiatric:        Mood and Affect: Mood normal.        Behavior: Behavior normal.        Thought Content: Thought content normal.        Judgment: Judgment normal.       Results for orders placed or performed in visit on 05/30/20  Hemoglobin A1c  Result Value Ref Range   Hemoglobin A1C 7.0     Assessment & Plan     Problem List Items Addressed This Visit      Cardiovascular and Mediastinum   Essential hypertension - Primary    Elevated today Followed by Christell Faith, PAC, Cardiology Continue medications as prescribed by Cardiology      Relevant Medications   isosorbide mononitrate (IMDUR) 30 MG 24 hr tablet   verapamil (CALAN-SR) 240 MG CR tablet   Other Relevant Orders   Ambulatory referral to Cardiology     Respiratory   OSA (obstructive sleep apnea)    New diagnosis.  CPap order by cardiology.  Pt states he is using his Cpap every night and tolerates it with good symptom control.       Relevant Orders   Ambulatory referral to Cardiology     Endocrine   Controlled type 2 diabetes mellitus without complication, without long-term current use of  insulin ([facility])    Last A1C 7.0 on 03/2020.  Continue to follow with  Endocrinology.  Advised pt he is due for an eye exam.        Genitourinary   Chronic kidney disease, stage 3b   Relevant Orders   Ambulatory referral to Nephrology     Other   HLD (hyperlipidemia)    Pt stopped Atorvastatin in 03/2020 after his endocrinology apt.  He states his kidney functions declined.  He thought Atorvastatin effected his kidneys.  Advised pt that statin usually does not effect kidneys.  Will check labs in August with Endo, and will likely restart his statin.   Will refer to Nephrology to evaluate and treat kidney functions.       Relevant Medications   isosorbide mononitrate (IMDUR) 30 MG 24 hr tablet   verapamil (CALAN-SR) 240 MG CR tablet   Obesity (BMI 30.0-34.9)    Discussed importance of healthy weight management Discussed diet and exercise       History of lung cancer       Return in about 6 months (around 11/30/2020).      Reynolds Bowl, PA-C, have reviewed all documentation for this visit. The documentation on 06/03/20 for the exam, diagnosis, procedures, and orders are all accurate and complete.   Rubye Beach  [facility] [phone] (phone) [phone] (fax)  [place]

## 2020-05-30 NOTE — Assessment & Plan Note (Signed)
New diagnosis.  CPap order by cardiology.  Pt states he is using his Cpap every night and tolerates it with good symptom control.

## 2020-06-03 ENCOUNTER — Encounter: Payer: Self-pay | Admitting: Physician Assistant

## 2020-06-03 NOTE — Assessment & Plan Note (Signed)
Elevated today Followed by Nicholas Nolan, [facility], Cardiology Continue medications as prescribed by Cardiology

## 2020-06-09 ENCOUNTER — Ambulatory Visit: Payer: Medicare PPO | Admitting: Physician Assistant

## 2020-06-09 ENCOUNTER — Other Ambulatory Visit: Payer: Self-pay

## 2020-06-09 ENCOUNTER — Encounter: Payer: Self-pay | Admitting: Physician Assistant

## 2020-06-09 VITALS — BP 168/64 | HR 70 | Ht 70.0 in | Wt 215.5 lb

## 2020-06-09 DIAGNOSIS — I2510 Atherosclerotic heart disease of native coronary artery without angina pectoris: Secondary | ICD-10-CM

## 2020-06-09 DIAGNOSIS — I10 Essential (primary) hypertension: Secondary | ICD-10-CM

## 2020-06-09 DIAGNOSIS — E785 Hyperlipidemia, unspecified: Secondary | ICD-10-CM

## 2020-06-09 DIAGNOSIS — Z9989 Dependence on other enabling machines and devices: Secondary | ICD-10-CM

## 2020-06-09 DIAGNOSIS — G4733 Obstructive sleep apnea (adult) (pediatric): Secondary | ICD-10-CM

## 2020-06-09 DIAGNOSIS — N183 Chronic kidney disease, stage 3 unspecified: Secondary | ICD-10-CM

## 2020-06-09 MED ORDER — HYDRALAZINE HCL 25 MG PO TABS: 25.0000 mg | ORAL_TABLET | Freq: Three times a day (TID) | ORAL | 3 refills | Status: DC

## 2020-06-09 MED ORDER — ISOSORBIDE MONONITRATE ER 60 MG PO TB24: 60.0000 mg | ORAL_TABLET | Freq: Every day | ORAL | 3 refills | Status: DC

## 2020-06-09 NOTE — Progress Notes (Signed)
Cardiology Office Note    Date:  06/09/2020   ID:  Nicholas Nolan, DOB [birth year], MRN [id number]  PCP:  Mar Daring, PA-C  Cardiologist:  Kate Sable, MD  Electrophysiologist:  None   Chief Complaint: Follow up  History of Present Illness:   Nicholas Nolan is a 69 y.o. male with history of  CAD noted on recent coronary CTA, DM, CKD stage III, HLD, difficult to control HTN, lung cancer s/p partial lobectomy in 2016, prior tobacco abuse, neuropathy, OSA on CPAP, and back pain who presents for follow up of  HTN.   Nicholas Nolan was seen by our office in consult on 07/24/2019 and noted a long history of hypertension that has been difficult to control on many drug regimens. Nicholas Nolan noted occasional chest pain when his BP was severely elevated with improvement in pain when his BP was improved. Nicholas Nolan had been managed with multiple antihypertensives including Benicar, Toprol-XL, HCTZ, verapamil, and clonidine.  Renal ultrasound in 05/2019 showed elevated peak systolic velocity in the mid segment of the right renal artery with follow up renal angiogram in 06/2019 showed no more than 15% stenosis on selective imaging of the bilateral renal arteries.  Echo showedan EF of 60 to 65%, mild LVH, diastolic dysfunction, normal RV systolic function and cavity size, and mild mitral regurgitation.  With discontinuation of beta-blocker if his fatigue did improve some though did not resolve. Nicholas Nolan was admitted to the hospital in 09/2019 with urinary retention requiring Foley catheter.  Nicholas Nolan underwent coronary CTA on 10/11/2019 which showed a calcium score of 1689, which was 94th percentile for age and sex matched control. There was mild stenosis along the proximal and distal LAD, long segment of severe calcific plaque along the mid LAD, heavily calcified plaque in the mid LCx, mild nonobstructive plaque throughout the RCA, estimated at < 25%. Study was submitted for FFR which showed no significant stenosis of the LAD or RCA.  There was no significant stenosis of the proximal LCx. FFR of the distal LCx was significant at 0.65-0.75, though the vessel was small in diameter, measuring < 2 mm in diameter. In this setting, medical therapy was advised.  I last saw him in 10/2019, Nicholas Nolan continued to note chest discomfort with elevated BP. BP was running in the 580D to 983J systolic. At that time, Nicholas Nolan was no longer on olmesartan or chlorthalidone secondary to AKI which was in the setting of urinary retention secondary to BPH. Imdur 30 mg daily was added. Nicholas Nolan was otherwise continued on amlodipine 10 mg daily, verapamil 240 mg daily, and clonidine was transitioned to 0.1 mg nightly. Nicholas Nolan remained off beta-blocker secondary to fatigue. Follow-up was recommended in 1 month. Nicholas Nolan has been referred to pulmonology for sleep study. Nicholas Nolan has subsequently undergone sleep study and been diagnosed with sleep apnea and is on CPAP, which is followed by them.  Nicholas Nolan was recently seen by his PCP on 05/30/2020 with BP of 165/68. For hypertension, Nicholas Nolan was taking amlodipine 10 mg daily, Imdur 30 mg daily, and verapamil 240 mg daily.  Nicholas Nolan comes in doing reasonably well from a cardiac perspective.  Since Nicholas Nolan was last seen Nicholas Nolan has not had any further chest pain.  Nicholas Nolan is compliant with his CPAP and notes this has improved his fatigue.  His blood pressures continue to be elevated in the 825K to 539J systolic.  Nicholas Nolan is compliant with his current antihypertensive regimen.  Nicholas Nolan is trying to watch his salt intake and does not add  salt to food any longer.  Nicholas Nolan does not drink alcohol or caffeine.  Nicholas Nolan is a non-smoker.  Weight remains stable.   Labs independently reviewed: 03/2020 - A1c 7.0, potassium 4.6, BUN 26, serum creatinine 1.8 10/2019 - albumin 4.3, AST/ALT normal, TC 221, TG 153, HDL 43, LDL 150 05/2016 - TSH normal  Past Medical History:  Diagnosis Date  . Arthritis    hands  . Back pain    leg pain  . Diabetes mellitus    type 2  . History of urinary retention   .  Hyperlipidemia   . Hypertension   . Neuropathy    feet  . Primary cancer of right upper lobe of lung ([place]) 2014   RUL Lobectomy  . Wears dentures    full upper    Past Surgical History:  Procedure Laterality Date  . BASAL CELL CARCINOMA EXCISION    . COLONOSCOPY WITH PROPOFOL N/A 12/25/2019   Procedure: COLONOSCOPY WITH PROPOFOL;  Surgeon: Lin Landsman, MD;  Location: [facility];  Service: Endoscopy;  Laterality: N/A;  Diabetic - injectable and oral meds  . LUMBAR LAMINECTOMY/DECOMPRESSION MICRODISCECTOMY  01/10/2012   Procedure: LUMBAR LAMINECTOMY/DECOMPRESSION MICRODISCECTOMY 2 LEVELS;  Surgeon: Hosie Spangle, MD;  Location: [facility];  Service: Neurosurgery;  Laterality: Bilateral;  Lumbar four-Sacral One laminectomies  . LUNG SURGERY Right 11/22/12   right upper lobe  . POLYPECTOMY N/A 12/25/2019   Procedure: POLYPECTOMY;  Surgeon: Lin Landsman, MD;  Location: [facility];  Service: Endoscopy;  Laterality: N/A;  . PROSTATE SURGERY     biopsy-due to elelvated PSA  . RENAL ANGIOGRAPHY Right 07/02/2019   Procedure: RENAL ANGIOGRAPHY;  Surgeon: Algernon Huxley, MD;  Location: [facility];  Service: Cardiovascular;  Laterality: Right;  . SHOULDER SURGERY    . VASECTOMY      Current Medications: Current Meds  Medication Sig  . Dulaglutide (TRULICITY) 1.5 VH/8.4ON SOPN Inject 1.5 mg into the skin once a week.   Marland Kitchen FARXIGA 10 MG TABS tablet Take 10 mg by mouth every morning.  Marland Kitchen glimepiride (AMARYL) 4 MG tablet Take 0.5 tablets (2 mg total) by mouth daily.  Marland Kitchen glucose blood (ONETOUCH VERIO) test strip USE ONCE DAILY. USE AS INSTRUCTED.  Marland Kitchen isosorbide mononitrate (IMDUR) 30 MG 24 hr tablet Take 1 tablet (30 mg total) by mouth daily.  . nitroGLYCERIN (NITROSTAT) 0.4 MG SL tablet Place 1 tablet (0.4 mg total) under the tongue every 5 (five) minutes as needed for chest pain.  . tamsulosin (FLOMAX) 0.4 MG CAPS capsule TAKE 1 CAPSULE (0.4 MG TOTAL) BY  MOUTH DAILY AFTER SUPPER.  . verapamil (CALAN-SR) 240 MG CR tablet Take 1 tablet (240 mg total) by mouth at bedtime.    Allergies:   Codeine, Hydromorphone, and Morphine and related   Social History   Socioeconomic History  . Marital status: Married    Spouse name: Not on file  . Number of children: 4  . Years of education: Not on file  . Highest education level: Associate degree: occupational, Hotel manager, or vocational program  Occupational History  . Occupation: retired  Tobacco Use  . Smoking status: Former Smoker    Packs/day: 2.00    Years: 45.00    Pack years: 90.00    Types: Cigarettes    Quit date: 11/28/2012    Years since quitting: 7.5  . Smokeless tobacco: Current User    Types: Snuff  . Tobacco comment: Quit in 2014  Vaping  Use  . Vaping Use: Never used  Substance and Sexual Activity  . Alcohol use: No  . Drug use: No  . Sexual activity: Yes    Birth control/protection: None  Other Topics Concern  . Not on file  Social History Narrative  . Not on file   Social Determinants of Health   Financial Resource Strain: Low Risk   . Difficulty of Paying Living Expenses: Not hard at all  Food Insecurity: No Food Insecurity  . Worried About Charity fundraiser in the Last Year: Never true  . Ran Out of Food in the Last Year: Never true  Transportation Needs: No Transportation Needs  . Lack of Transportation (Medical): No  . Lack of Transportation (Non-Medical): No  Physical Activity: Inactive  . Days of Exercise per Week: 0 days  . Minutes of Exercise per Session: 0 min  Stress: No Stress Concern Present  . Feeling of Stress : Not at all  Social Connections: Moderately Integrated  . Frequency of Communication with Friends and Family: More than three times a week  . Frequency of Social Gatherings with Friends and Family: More than three times a week  . Attends Religious Services: More than 4 times per year  . Active Member of Clubs or Organizations: No  .  Attends Archivist Meetings: Never  . Marital Status: Married     Family History:  The patient's family history includes Cancer in his brother and father; Heart disease in his father; Stroke in his father.  ROS:   Review of Systems  Constitutional: Negative for chills, diaphoresis, fever, malaise/fatigue and weight loss.  HENT: Negative for congestion.   Eyes: Negative for discharge and redness.  Respiratory: Negative for cough, sputum production, shortness of breath and wheezing.   Cardiovascular: Negative for chest pain, palpitations, orthopnea, claudication, leg swelling and PND.  Gastrointestinal: Negative for abdominal pain, heartburn, nausea and vomiting.  Musculoskeletal: Negative for falls and myalgias.  Skin: Negative for rash.  Neurological: Negative for dizziness, tingling, tremors, sensory change, speech change, focal weakness, loss of consciousness and weakness.  Endo/Heme/Allergies: Does not bruise/bleed easily.  Psychiatric/Behavioral: Negative for substance abuse. The patient is not nervous/anxious.   All other systems reviewed and are negative.    EKGs/Labs/Other Studies Reviewed:    Studies reviewed were summarized above. The additional studies were reviewed today:  2D Echo 08/2019: 1. Left ventricular ejection fraction, by visual estimation, is 60 to 65%. The left ventricle has normal function. Normal left ventricular size. There is mildly increased left ventricular hypertrophy. 2. Left ventricular diastolic Doppler parameters are consistent with impaired relaxation pattern of LV diastolic filling. 3. Global right ventricle has normal systolic function.The right ventricular size is normal. No increase in right ventricular wall thickness. 4. Left atrial size was normal. 5. TR signal is inadequate for assessing pulmonary artery systolic pressure. __________  Coronary CTA 10/2019: IMPRESSION: 1. Coronary calcium score of 1689. This was 6  percentile for age and sex matched control. 2. Normal coronary origin with right dominance. 3. Severe calcific plaques in the mid LAD and RCA. Degree of stenosis cannot be determined due to calcium blooming artifacts.  Possible CAD-RADS 4, Severe stenosis. Cardiac catheterization or CT FFR is recommended. CT FFR will be submitted. Consider symptom-guided anti-ischemic pharmacotherapy as well as risk factor modification per guideline directed care. __________  FFR 10/2019: 1. Left Main: No significant stenosis. 2. LAD: No significant stenosis. 3. LCX: No significant stenosis in the proximal segment. Significant stenosis  in the distal segment with FFR range of 0.65 - 0.75 4. RCA: No significant stenosis.  IMPRESSION: 1. CT FFR analysis showed significant stenosis in the distal segment of the LCX. The distal segment of the LCX is a small vessel with <72mm diameter. 2. Guideline directed medical therapy recommended.   EKG:  EKG is ordered today.  The EKG ordered today demonstrates NSR, 69 bpm, no acute ST-T changes  Recent Labs: 10/03/2019: Hemoglobin 10.5; Platelets 231 10/19/2019: ALT 10; BUN 30; Potassium 5.3; Sodium 140 01/10/2020: Creatinine, Ser 2.30  Recent Lipid Panel    Component Value Date/Time   CHOL 221 (H) 10/19/2019 1046   TRIG 153 (H) 10/19/2019 1046   HDL 43 10/19/2019 1046   CHOLHDL 5.1 (H) 10/19/2019 1046   LDLCALC 150 (H) 10/19/2019 1046    PHYSICAL EXAM:    VS:  BP (!) 168/64 (BP Location: Left Arm, Patient Position: Sitting, Cuff Size: Normal)   Pulse 70   Ht 5\' 10"  (1.778 m)   Wt 215 lb 8 oz (97.8 kg)   SpO2 95%   BMI 30.92 kg/m   BMI: Body mass index is 30.92 kg/m.  Physical Exam Constitutional:      Appearance: Nicholas Nolan is well-developed.  HENT:     Head: Normocephalic and atraumatic.  Eyes:     General:        Right eye: No discharge.        Left eye: No discharge.  Neck:     Vascular: No JVD.  Cardiovascular:     Rate and Rhythm:  Normal rate and regular rhythm.     Pulses: No midsystolic click and no opening snap.          Posterior tibial pulses are 2+ on the right side and 2+ on the left side.     Heart sounds: Normal heart sounds, S1 normal and S2 normal. Heart sounds not distant. No murmur heard.  No friction rub.  Pulmonary:     Effort: Pulmonary effort is normal. No respiratory distress.     Breath sounds: Normal breath sounds. No decreased breath sounds, wheezing or rales.  Chest:     Chest wall: No tenderness.  Abdominal:     General: There is no distension.     Palpations: Abdomen is soft.     Tenderness: There is no abdominal tenderness.  Musculoskeletal:     Cervical back: Normal range of motion.  Skin:    General: Skin is warm and dry.     Nails: There is no clubbing.  Neurological:     Mental Status: Nicholas Nolan is alert and oriented to person, place, and time.  Psychiatric:        Speech: Speech normal.        Behavior: Behavior normal.        Thought Content: Thought content normal.        Judgment: Judgment normal.     Wt Readings from Last 3 Encounters:  06/09/20 215 lb 8 oz (97.8 kg)  05/30/20 (!) 213 lb (96.6 kg)  01/18/20 212 lb (96.2 kg)     ASSESSMENT & PLAN:   1. Refractory hypertension: Blood pressure continues to be suboptimally controlled despite compliance with CPAP following recently diagnosed sleep apnea.  Prior renal artery angiogram showed no significant RAS bilaterally.  Unable to place patient back on ACE inhibitor/ARB/thiazide diuretic/loop diuretic in the setting of CKD with prior history of urinary retention.  Could undergo rechallenge of beta-blocker given that his fatigue was  likely in the setting of undiagnosed sleep apnea.  Check renin aldosterone ratio, cortisol, and TSH.  Titrate Imdur to 60 mg daily.  Add hydralazine 25 mg 3 times daily.  Nicholas Nolan will otherwise continue current regimen.  Low-sodium diet recommended.  Weight loss recommended.  2. CAD involving the native  coronary arteries without angina: Nicholas Nolan has done well without any symptoms concerning for angina since Nicholas Nolan was last seen.  Consider resuming atorvastatin as outlined below.  Recommend Nicholas Nolan start ASA 81 mg daily.  Otherwise, Nicholas Nolan will continue Imdur as outlined above.  No plans for further ischemic evaluation at this time.  3. HLD: No longer on atorvastatin as of 03/2020 following his endocrinology appointment.  PCP is considering restarting this.  4. Sleep apnea: Compliant with CPAP.  Fatigue has resolved with initiation of therapy.  Managed by pulmonology.  5. CKD stage III: Follow-up with PCP as directed.  Disposition: F/u with Dr. Garen Lah in 1 month.   Medication Adjustments/Labs and Tests Ordered: Current medicines are reviewed at length with the patient today.  Concerns regarding medicines are outlined above. Medication changes, Labs and Tests ordered today are summarized above and listed in the Patient Instructions accessible in Encounters.   Signed, Christell Faith, PA-C 06/09/2020 2:00 PM     [address] [place], [place] [address] [phone]

## 2020-06-09 NOTE — Patient Instructions (Signed)
Medication Instructions:  1- INCREASE Imdur 60 mg total  Once daily  2- START Hydralazine 25 mg total 3 times daily  *If you need a refill on your cardiac medications before your next appointment, please call your pharmacy*   Lab Work: Your physician recommends that you have lab work today(TSH, renin/aldosterone, cortisol)  If you have labs (blood work) drawn today and your tests are completely normal, you will receive your results only by: Marland Kitchen MyChart Message (if you have MyChart) OR . A paper copy in the mail If you have any lab test that is abnormal or we need to change your treatment, we will call you to review the results.   Testing/Procedures: None ordered   Follow-Up: At [facility], you and your health needs are our priority.  As part of our continuing mission to provide you with exceptional heart care, we have created designated Provider Care Teams.  These Care Teams include your primary Cardiologist (physician) and Advanced Practice Providers (APPs -  Physician Assistants and Nurse Practitioners) who all work together to provide you with the care you need, when you need it.  We recommend signing up for the patient portal called "MyChart".  Sign up information is provided on this After Visit Summary.  MyChart is used to connect with patients for Virtual Visits (Telemedicine).  Patients are able to view lab/test results, encounter notes, upcoming appointments, etc.  Non-urgent messages can be sent to your provider as well.   To learn more about what you can do with MyChart, go to [email or web address].    Your next appointment:   1 month(s)  The format for your next appointment:   In Person  Provider:    You may see Kate Sable, MD or Christell Faith, PA-C

## 2020-06-10 LAB — TSH: TSH: 0.877 u[IU]/mL (ref 0.450–4.500)

## 2020-06-10 LAB — CORTISOL: Cortisol: 7.5 ug/dL

## 2020-06-11 ENCOUNTER — Other Ambulatory Visit: Payer: Self-pay | Admitting: Physician Assistant

## 2020-06-11 DIAGNOSIS — I10 Essential (primary) hypertension: Secondary | ICD-10-CM

## 2020-06-13 LAB — ALDOSTERONE + RENIN ACTIVITY W/ RATIO
ALDOS/RENIN RATIO: 8.9 (ref 0.0–30.0)
ALDOSTERONE: 15.8 ng/dL (ref 0.0–30.0)
Renin: 1.772 ng/mL/hr (ref 0.167–5.380)

## 2020-07-02 ENCOUNTER — Other Ambulatory Visit: Payer: Self-pay

## 2020-07-02 ENCOUNTER — Ambulatory Visit: Payer: Medicare PPO | Admitting: Nurse Practitioner

## 2020-07-02 ENCOUNTER — Encounter: Payer: Self-pay | Admitting: Nurse Practitioner

## 2020-07-02 VITALS — BP 132/50 | HR 69 | Ht 70.0 in | Wt 219.0 lb

## 2020-07-02 DIAGNOSIS — N183 Chronic kidney disease, stage 3 unspecified: Secondary | ICD-10-CM

## 2020-07-02 DIAGNOSIS — I10 Essential (primary) hypertension: Secondary | ICD-10-CM | POA: Diagnosis not present

## 2020-07-02 DIAGNOSIS — E785 Hyperlipidemia, unspecified: Secondary | ICD-10-CM | POA: Diagnosis not present

## 2020-07-02 DIAGNOSIS — I739 Peripheral vascular disease, unspecified: Secondary | ICD-10-CM

## 2020-07-02 DIAGNOSIS — I2510 Atherosclerotic heart disease of native coronary artery without angina pectoris: Secondary | ICD-10-CM

## 2020-07-02 MED ORDER — HYDRALAZINE HCL 50 MG PO TABS: 50.0000 mg | ORAL_TABLET | Freq: Three times a day (TID) | ORAL | 3 refills | Status: DC

## 2020-07-02 NOTE — Patient Instructions (Addendum)
Medication Instructions:  - Your physician has recommended you make the following change in your medication:   1) INCREASE hydralazine 50 mg- take 1 tablet by mouth three times a day  *If you need a refill on your cardiac medications before your next appointment, please call your pharmacy*   Lab Work: - none ordered  If you have labs (blood work) drawn today and your tests are completely normal, you will receive your results only by: Marland Kitchen MyChart Message (if you have MyChart) OR . A paper copy in the mail If you have any lab test that is abnormal or we need to change your treatment, we will call you to review the results.   Testing/Procedures: - Your physician has requested that you have an ankle brachial index (ABI). During this test an ultrasound and blood pressure cuff are used to evaluate the arteries that supply the arms and legs with blood. Allow thirty minutes for this exam. There are no restrictions or special instructions.  - Your physician has requested that you have a lower extremity arterial duplex. This test is an ultrasound of the arteries in the legs. It looks at arterial blood flow in the legs. Allow one hour for Lower Arterial scans. There are no restrictions or special instructions   Follow-Up: At [facility], you and your health needs are our priority.  As part of our continuing mission to provide you with exceptional heart care, we have created designated Provider Care Teams.  These Care Teams include your primary Cardiologist (physician) and Advanced Practice Providers (APPs -  Physician Assistants and Nurse Practitioners) who all work together to provide you with the care you need, when you need it.  We recommend signing up for the patient portal called "MyChart".  Sign up information is provided on this After Visit Summary.  MyChart is used to connect with patients for Virtual Visits (Telemedicine).  Patients are able to view lab/test results, encounter notes,  upcoming appointments, etc.  Non-urgent messages can be sent to your provider as well.   To learn more about what you can do with MyChart, go to [email or web address].    Your next appointment:   6 week(s)  The format for your next appointment:   In Person  Provider:   Kate Sable, MD   Other Instructions    Ankle-Brachial Index Test Why am I having this test? The ankle-brachial index (ABI) test is used to diagnose peripheral vascular disease (PVD). PVD is also known as peripheral arterial disease (PAD). PVD is the blocking or hardening of the arteries anywhere within the circulatory system beyond the heart. PVD is caused by:  Cholesterol deposits in your blood vessels (atherosclerosis). This is the most common cause of this condition.  Irritation and swelling (inflammation) in the blood vessels.  Blood clots in the vessels. Cholesterol deposits cause arteries to narrow. Normal delivery of oxygen to your tissues is affected, causing muscle pain and fatigue. This is called claudication. PVD means that there may also be a buildup of cholesterol:  In your heart. This increases the risk of heart attacks.  In your brain. This increases the risk of strokes. What is being tested? The ankle-brachial index test measures the blood flow in your arms and legs. The blood flow will show if blood vessels in your legs have been narrowed by cholesterol deposits. How do I prepare for this test?  Wear loose clothing.  Do not use any tobacco products, including cigarettes, chewing tobacco, or e-cigarettes, for at least  30 minutes before the test. What happens during the test?  1. You will lie down in a resting position. 2. Your health care provider will use a blood pressure machine and a small ultrasound device (Doppler) to measure the systolic pressures on your upper arms and ankles. Systolic pressure is the pressure inside your arteries when your heart pumps. 3. Systolic pressure  measurements will be taken several times, and at several points, on both the ankle and the arm. 4. Your health care provider will divide the highest systolic pressure of the ankle by the highest systolic pressure of the arm. The result is the ankle-brachial pressure ratio, or ABI. Sometimes this test will be repeated after you have exercised on a treadmill for 5 minutes. You may have leg pain during the exercise portion of the test if you suffer from PVD. If the index number drops after exercise, this may show that PVD is present. How are the results reported? Your test results will be reported as a value that shows the ratio of your ankle pressure to your arm pressure (ABI ratio). Your health care provider will compare your results to normal ranges that were established after testing a large group of people (reference ranges). Reference ranges may vary among labs and hospitals. For this test, a common reference range is:  ABI ratio of 0.9 to 1.3. What do the results mean? An ABI ratio that is below the reference range is considered abnormal and may indicate PVD in the legs. Talk with your health care provider about what your results mean. Questions to ask your health care provider Ask your health care provider, or the department that is doing the test:  When will my results be ready?  How will I get my results?  What are my treatment options?  What other tests do I need?  What are my next steps? Summary  The ankle-brachial index (ABI) test is used to diagnose peripheral vascular disease (PVD). PVD is also known as peripheral arterial disease (PAD).  The ankle-brachial index test measures the blood flow in your arms and legs.  The highest systolic pressure of the ankle is divided by the highest systolic pressure of the arm. The result is the ABI ratio.  An ABI ratio that is below 0.9 is considered abnormal and may indicate PVD in the legs. This information is not intended to replace  advice given to you by your health care provider. Make sure you discuss any questions you have with your health care provider. Document Revised: 08/17/2018 Document Reviewed: 07/19/2017 Elsevier Patient Education  2020 [employer].

## 2020-07-02 NOTE — Progress Notes (Signed)
Office Visit    Patient Name: Nicholas Nolan Date of Encounter: 07/02/2020  Primary Care Provider:  Mar Daring, PA-C Primary Cardiologist:  Kate Sable, MD  Chief Complaint    69 year old male with a history of nonobstructive CAD, diabetes, difficult to control hypertension, hyperlipidemia, stage III chronic kidney disease, remote tobacco abuse, lung cancer status post partial lobectomy (2016), peripheral neuropathy, sleep apnea on CPAP, and chronic back pain, who presents for follow-up of hypertension.  Past Medical History    Past Medical History:  Diagnosis Date  . Arthritis    hands  . Back pain    leg pain  . CAD (coronary artery disease)    a. 10/2019 Cor CTA: LM nl, LAD nl, LCX distal dzs w/ FFR 0.65-0.75. RCA nl-->Med Rx.  . CKD (chronic kidney disease), stage III   . Claudication ([place])   . Diabetes mellitus    type 2  . Diastolic dysfunction    a. 08/2019 Echo: EF 60-65%, mild LVH, impaired relaxation. Nl RV size/fxn. Nl LA szie.  Marland Kitchen History of urinary retention   . Hyperlipidemia   . Hypertension    a. 05/2019 renal u/s concerning for R RAS; b. 06/2019 Renal artery angio: no more than15% bilat RAS.  Marland Kitchen Neuropathy    feet  . Primary cancer of right upper lobe of lung ([place]) 2014   RUL Lobectomy  . Wears dentures    full upper   Past Surgical History:  Procedure Laterality Date  . BASAL CELL CARCINOMA EXCISION    . COLONOSCOPY WITH PROPOFOL N/A 12/25/2019   Procedure: COLONOSCOPY WITH PROPOFOL;  Surgeon: Lin Landsman, MD;  Location: [facility];  Service: Endoscopy;  Laterality: N/A;  Diabetic - injectable and oral meds  . LUMBAR LAMINECTOMY/DECOMPRESSION MICRODISCECTOMY  01/10/2012   Procedure: LUMBAR LAMINECTOMY/DECOMPRESSION MICRODISCECTOMY 2 LEVELS;  Surgeon: Hosie Spangle, MD;  Location: [facility];  Service: Neurosurgery;  Laterality: Bilateral;  Lumbar four-Sacral One laminectomies  . LUNG SURGERY Right 11/22/12   right  upper lobe  . POLYPECTOMY N/A 12/25/2019   Procedure: POLYPECTOMY;  Surgeon: Lin Landsman, MD;  Location: [facility];  Service: Endoscopy;  Laterality: N/A;  . PROSTATE SURGERY     biopsy-due to elelvated PSA  . RENAL ANGIOGRAPHY Right 07/02/2019   Procedure: RENAL ANGIOGRAPHY;  Surgeon: Algernon Huxley, MD;  Location: [facility];  Service: Cardiovascular;  Laterality: Right;  . SHOULDER SURGERY    . VASECTOMY      Allergies  Allergies  Allergen Reactions  . Codeine Itching    And hyper also  . Hydromorphone Itching  . Morphine And Related Itching    History of Present Illness    69 year old male with above complex past medical history including nonobstructive CAD, diabetes, difficult to control hypertension, hyperlipidemia, stage III chronic kidney disease, remote tobacco abuse, lung cancer status post partial lobectomy (2016), peripheral neuropathy, sleep apnea, and chronic back pain. In the setting of longstanding hypertension, renal ultrasound was performed July 2020 and suggested significant right renal artery stenosis. Renal angiogram in August 2020 showed no more than 50% stenosis involving bilateral renal arteries. He established with our clinic for hypertension management in September 2020. Echocardiogram showed an EF of 60-65% with mild LVH and impaired relaxation. In the setting of chronic dyspnea on exertion intermittent chest pain, he underwent coronary CT angio in December revealing moderate nonobstructive left circumflex disease with an FFR of 0.65-0.75. Medical therapy was recommended. Unfortunately,  in the setting of urinary retention and acute kidney injury late last year, ARB and diuretic therapy were discontinued. Long-acting nitrate therapy was added for blood pressure and chest discomfort management. Blood pressures remained difficult to manage, typically trending in the 160s at home. Though he had previously been on clonidine, this was poorly  tolerated and was weaned off. He was seen August 2 at which time he was hypertensive and Imdur was increased to 60 mg daily. Hydralazine 25 mg 3 times daily was added as well.   Since his last visit, he says blood pressure has continued to trend in the 160s at home. Pressure is 132/50 currently but he says he was on endocrine appointment earlier and his pressure was in the 160s. He has chronic dyspnea on exertion which is unchanged since prior visit. He denies any chest pain, PND, orthopnea, palpitations, dizziness, syncope, edema, or early satiety. He has been experiencing bilateral claudication involving his anterior thighs and calves. This typically occurs after walking about 100 yards and resolves with rest. He has not noticed any change in sensation, color, temperature, or hair distribution of his lower extremities.  Home Medications    Prior to Admission medications   Medication Sig Start Date End Date Taking? Authorizing Provider  atorvastatin (LIPITOR) 80 MG tablet Take by mouth.   Yes [provider]  Dulaglutide (TRULICITY) 1.5 TG/2.5WL SOPN Inject 1.5 mg into the skin once a week.  12/17/15  Yes [provider]  FARXIGA 10 MG TABS tablet Take 10 mg by mouth every morning. 08/31/19  Yes [provider]  glimepiride (AMARYL) 4 MG tablet Take 0.5 tablets (2 mg total) by mouth daily. 10/01/19  Yes Fenton Malling M, PA-C  glucose blood (ONETOUCH VERIO) test strip USE ONCE DAILY. USE AS INSTRUCTED. 06/29/18  Yes [provider]  hydrALAZINE (APRESOLINE) 25 MG tablet Take 1 tablet (25 mg total) by mouth 3 (three) times daily. 06/09/20 09/07/20 Yes Dunn, Areta Haber, PA-C  isosorbide mononitrate (IMDUR) 60 MG 24 hr tablet Take 1 tablet (60 mg total) by mouth daily. 06/09/20  Yes Dunn, Areta Haber, PA-C  nitroGLYCERIN (NITROSTAT) 0.4 MG SL tablet Place 1 tablet (0.4 mg total) under the tongue every 5 (five) minutes as needed for chest pain. 10/19/19 06/09/29 Yes Dunn, Areta Haber,  PA-C  tamsulosin (FLOMAX) 0.4 MG CAPS capsule TAKE 1 CAPSULE (0.4 MG TOTAL) BY MOUTH DAILY AFTER SUPPER. 04/08/20  Yes Stoioff, Ronda Fairly, MD  verapamil (CALAN-SR) 240 MG CR tablet Take 1 tablet (240 mg total) by mouth at bedtime. 05/30/20  Yes Mar Daring, PA-C  amLODipine (NORVASC) 10 MG tablet Take 1 tablet (10 mg total) by mouth daily. 07/24/19 12/19/19  Kate Sable, MD  metFORMIN (GLUCOPHAGE) 1000 MG tablet TAKE ONE TABLET BY MOUTH TWICE A DAY WITH A MEAL 03/14/18   Mar Daring, PA-C  Aspirin 81 mg daily  Review of Systems    Chronic dyspnea on exertion which is unchanged. He has been experiencing bilateral thigh and calf claudication over the past 3 to 5 months. He did not mention this at his last visit. He denies chest pain, palpitations, PND, orthopnea, dizziness, syncope, edema, or early satiety.  All other systems reviewed and are otherwise negative except as noted above.  Physical Exam    VS:  BP (!) 132/50 (BP Location: Right Arm, Patient Position: Sitting, Cuff Size: Normal)   Pulse 69   Ht 5\' 10"  (1.778 m)   Wt 219 lb (99.3 kg)  SpO2 97%   BMI 31.42 kg/m  , BMI Body mass index is 31.42 kg/m. GEN: Well nourished, well developed, in no acute distress. HEENT: normal. Neck: Supple, no JVD, carotid bruits, or masses. Cardiac: RRR, no murmurs, rubs, or gallops. No clubbing, cyanosis, edema.  Radials/PT 1+ and equal bilaterally.  Respiratory:  Respirations regular and unlabored, clear to auscultation bilaterally. GI: Soft, nontender, nondistended, BS + x 4. MS: no deformity or atrophy. Skin: warm and dry, no rash. Neuro:  Strength and sensation are intact. Psych: Normal affect.  Accessory Clinical Findings    ECG personally reviewed by me today -regular sinus rhythm, 69, LVH - no acute changes.  Lab Results  Component Value Date   WBC 9.3 10/03/2019   HGB 10.5 (L) 10/03/2019   HCT 31.7 (L) 10/03/2019   MCV 89.8 10/03/2019   PLT 231 10/03/2019   Lab  Results  Component Value Date   CREATININE 2.30 (H) 01/10/2020   BUN 30 (H) 10/19/2019   NA 140 10/19/2019   K 5.3 (H) 10/19/2019   CL 105 10/19/2019   CO2 20 10/19/2019   Lab Results  Component Value Date   ALT 10 10/19/2019   AST 12 10/19/2019   ALKPHOS 111 10/19/2019   BILITOT <0.2 10/19/2019   Lab Results  Component Value Date   CHOL 221 (H) 10/19/2019   HDL 43 10/19/2019   LDLCALC 150 (H) 10/19/2019   TRIG 153 (H) 10/19/2019   CHOLHDL 5.1 (H) 10/19/2019    Lab Results  Component Value Date   HGBA1C 7.0 03/19/2020    Assessment & Plan    1. Refractory hypertension: Long history of difficult to control hypertension with minimal nonobstructive renal artery stenosis and previously normal renin aldosterone ratio. At last clinic visit, Imdur was titrated to 6 mg daily and hydralazine was added at 25 mg 3 times daily. Although his pressure is better today at 132/50, he says this is unusual as he has been running in the 160s at home still. I will increase hydralazine to 50 mg 3 times daily and otherwise continue current doses of amlodipine isosorbide mononitrate, and verapamil. I do note that he is on 2 calcium channel blockers and also that he has been on these for quite some time and thus will not change today. As previously noted, he is not a candidate for ARB, diuretic, or MRA in the setting of CKD 3 with acute worsening last year.  2. Coronary artery disease: Moderate nonobstructive circumflex disease by coronary CT angiography last December. He has not been have any chest pain and remains on aspirin and nitrate. He was previously on statin therapy but stopped this in May. Again, recommend resumption.  3. Hyperlipidemia: He stopped his atorvastatin back in May. Strongly recommend resumption.  4. Sleep apnea: Reports compliance with CPAP.  5. Stage III chronic kidney disease: He has follow-up with nephrology. Most recent creatinine was stable at 2.1 on August 18.  6.  Bilateral lower extremity claudication: Today he reports a 3 to 48-month history of bilateral thigh and calf claudication occurring after walking about 100 yards. Weak but palpable distal pulses. We will arrange for ABIs and aortoiliac duplex. Continue aspirin. Recommend resumption of statin.  7. Disposition: Follow-up ABIs and aortoiliac duplex. Follow-up in clinic in approximately 6 weeks or sooner if necessary. I encouraged patient to reach out if pressures continue to trend high despite medication adjustment.  Murray Hodgkins, NP 07/02/2020, 5:15 PM

## 2020-07-07 ENCOUNTER — Ambulatory Visit: Payer: Medicare PPO | Admitting: Physician Assistant

## 2020-07-11 ENCOUNTER — Ambulatory Visit: Payer: Medicare PPO | Admitting: Cardiology

## 2020-07-15 DIAGNOSIS — D631 Anemia in chronic kidney disease: Secondary | ICD-10-CM | POA: Insufficient documentation

## 2020-07-15 DIAGNOSIS — I129 Hypertensive chronic kidney disease with stage 1 through stage 4 chronic kidney disease, or unspecified chronic kidney disease: Secondary | ICD-10-CM | POA: Insufficient documentation

## 2020-07-15 DIAGNOSIS — D649 Anemia, unspecified: Secondary | ICD-10-CM | POA: Insufficient documentation

## 2020-07-15 DIAGNOSIS — N189 Chronic kidney disease, unspecified: Secondary | ICD-10-CM | POA: Insufficient documentation

## 2020-07-16 ENCOUNTER — Telehealth: Payer: Self-pay

## 2020-07-16 DIAGNOSIS — R0602 Shortness of breath: Secondary | ICD-10-CM

## 2020-07-16 DIAGNOSIS — R059 Cough, unspecified: Secondary | ICD-10-CM

## 2020-07-16 DIAGNOSIS — R509 Fever, unspecified: Secondary | ICD-10-CM

## 2020-07-16 NOTE — Telephone Encounter (Signed)
Copied from [facility] [id number]. Topic: General - Other >> Jul 16, 2020  4:01 PM Rainey Pines A wrote: Patient has been feeling ill and Mrs. Bos wants to know if PCP would advise that the rapid covid test results are accurate. Mrs. Dobie would like a callback from [place] today. Please advise

## 2020-07-16 NOTE — Telephone Encounter (Signed)
Patient is coming tomorrow to have covid test done at 4pm. Patient had a covid test done on Friday but it was negative. Per wife patient is having fever,sorethroat, sob and headache. Reports the sob started first the beginning of last week but the rest of the symptoms started on Friday.

## 2020-07-16 NOTE — Telephone Encounter (Signed)
Covid test ordered

## 2020-07-17 ENCOUNTER — Ambulatory Visit
Admission: RE | Admit: 2020-07-17 | Discharge: 2020-07-17 | Disposition: A | Discharge status: Home / Self Care | Payer: Medicare PPO | Source: Ambulatory Visit | Attending: Physician Assistant | Admitting: Physician Assistant

## 2020-07-17 ENCOUNTER — Other Ambulatory Visit: Payer: Self-pay

## 2020-07-17 ENCOUNTER — Telehealth: Payer: Self-pay | Admitting: Physician Assistant

## 2020-07-17 ENCOUNTER — Ambulatory Visit: Payer: Self-pay | Admitting: *Deleted

## 2020-07-17 ENCOUNTER — Ambulatory Visit
Admission: RE | Admit: 2020-07-17 | Discharge: 2020-07-17 | Disposition: A | Discharge status: Home / Self Care | Payer: Medicare PPO | Attending: Physician Assistant | Admitting: Physician Assistant

## 2020-07-17 DIAGNOSIS — R0602 Shortness of breath: Secondary | ICD-10-CM

## 2020-07-17 DIAGNOSIS — R05 Cough: Secondary | ICD-10-CM | POA: Diagnosis present

## 2020-07-17 DIAGNOSIS — R059 Cough, unspecified: Secondary | ICD-10-CM

## 2020-07-17 DIAGNOSIS — R509 Fever, unspecified: Secondary | ICD-10-CM | POA: Diagnosis not present

## 2020-07-17 NOTE — Telephone Encounter (Signed)
Pts daughter, Amy called to get results of chest xray results.  She is a Games developer" and told me

## 2020-07-17 NOTE — Telephone Encounter (Signed)
Patient's daughter Amy and wife on the line called saying that a triage nurse called them to give chest x-ray results and the wife said he was supposed to be covid tested in the parking lot, but wasn't sure if this is right. I didn't see any results in the chart. I called the office and spoke to [place], [facility] who asked to speak to them. The call was connected.

## 2020-07-17 NOTE — Telephone Encounter (Signed)
Pt 's daughter Amy called this morning.  She Is a EMT and thinks her dad needs a chest xray and antibiotic.  She said is has a cough, SOB and fever of 101.9 at highest.  Now 99.5.  She wants to know if Tawanna Sat will order an xray.  CB#  [phone]

## 2020-07-17 NOTE — Telephone Encounter (Signed)
I will order CXR but they need to call today before they go over and inform them he has URI symptoms with fever and undergoing covid 19 testing so they can facilitate him coming in and lessen exposure.  [place] outpatient imaging phone number is [phone]

## 2020-07-17 NOTE — Telephone Encounter (Signed)
Tried calling patient's daughter Amy. Left message to call back. OK for [facility] triage to advise as below.

## 2020-07-17 NOTE — Telephone Encounter (Signed)
Patient's daughter called to request chest xray results for her father. Daughter not on DPR. Daughter attempted 3 way call to patient and patient's wife and call was disconnected. Called patient and reported chest xray results not in chart at this time. Patient notified to keep virtual appt on Monday until further notice from office. Patient verbalized understanding when results in chart he would receive a call of the results.

## 2020-07-17 NOTE — Telephone Encounter (Signed)
Patient advised by Ines Bloomer.

## 2020-07-17 NOTE — Telephone Encounter (Signed)
I would keep for right now   May be able to cancel later

## 2020-07-17 NOTE — Addendum Note (Signed)
Addended by: Mar Daring on: 07/17/2020 01:16 PM   Modules accepted: Orders

## 2020-07-17 NOTE — Telephone Encounter (Signed)
Patient's daughter Amy advised as directed below. She also asked if it was still necessary to keep the appointment he has on Monday since it was for all this.

## 2020-07-18 ENCOUNTER — Other Ambulatory Visit: Payer: Self-pay | Admitting: Physician Assistant

## 2020-07-18 MED ORDER — AZITHROMYCIN 250 MG PO TABS: ORAL_TABLET | ORAL | 0 refills | Status: DC

## 2020-07-18 NOTE — Telephone Encounter (Signed)
Xray does not show true pneumonia but hard to tell since there is a lot of surgical changes around the right lung. I can send in zpak since symptoms have been present over a week and treat empirically for atypical pneumonia. If he continues to decline will need urgent evaluation.  Written by Mar Daring, PA-C on 07/18/2020  8:17 AM EDT Seen by patient Steffanie Dunn on 07/18/2020  9:41 AM

## 2020-07-18 NOTE — Progress Notes (Signed)
zpak for possible pneumonia since symptoms present over one week and CXR inconclusive

## 2020-07-18 NOTE — Telephone Encounter (Signed)
-----   Message from Mar Daring, [place] sent at 07/18/2020  8:17 AM EDT ----- Nicholas Nolan does not show true pneumonia but hard to tell since there is a lot of surgical changes around the right lung. I can send in zpak since symptoms have been present over a week and treat empirically for atypical pneumonia. If he continues to decline will need urgent evaluation.

## 2020-07-18 NOTE — Telephone Encounter (Signed)
Wife calling this am for chest xray results.  Would like call back.

## 2020-07-18 NOTE — Telephone Encounter (Signed)
Patient NO show for his covid test yesterday

## 2020-07-18 NOTE — Telephone Encounter (Signed)
LMTCB noticed patient saw the chest xray results via mychart/not sure if wife has questions. FYI-Wife is on patient's DPR but not the daughter Amy. If patient calls back OK for nurse to give results.

## 2020-07-21 ENCOUNTER — Other Ambulatory Visit: Payer: Self-pay

## 2020-07-21 ENCOUNTER — Telehealth (INDEPENDENT_AMBULATORY_CARE_PROVIDER_SITE_OTHER): Payer: Medicare PPO | Admitting: Physician Assistant

## 2020-07-21 ENCOUNTER — Encounter: Payer: Self-pay | Admitting: Physician Assistant

## 2020-07-21 DIAGNOSIS — J189 Pneumonia, unspecified organism: Secondary | ICD-10-CM | POA: Diagnosis not present

## 2020-07-21 MED ORDER — ALBUTEROL SULFATE HFA 108 (90 BASE) MCG/ACT IN AERS: 1.0000 | INHALATION_SPRAY | RESPIRATORY_TRACT | 0 refills | Status: DC | PRN

## 2020-07-21 MED ORDER — FUROSEMIDE 20 MG PO TABS: 20.0000 mg | ORAL_TABLET | Freq: Every day | ORAL | 0 refills | Status: DC

## 2020-07-21 MED ORDER — AMOXICILLIN-POT CLAVULANATE 875-125 MG PO TABS: 1.0000 | ORAL_TABLET | Freq: Two times a day (BID) | ORAL | 0 refills | Status: DC

## 2020-07-21 NOTE — Patient Instructions (Signed)
Community-Acquired Pneumonia, Adult Pneumonia is an infection of the lungs. It causes swelling in the airways of the lungs. Mucus and fluid may also build up inside the airways. One type of pneumonia can happen while a person is in a hospital. A different type can happen when a person is not in a hospital (community-acquired pneumonia).  What are the causes?  This condition is caused by germs (viruses, bacteria, or fungi). Some types of germs can be passed from one person to another. This can happen when you breathe in droplets from the cough or sneeze of an infected person. What increases the risk? You are more likely to develop this condition if you:  Have a long-term (chronic) disease, such as: ? Chronic obstructive pulmonary disease (COPD). ? Asthma. ? Cystic fibrosis. ? Congestive heart failure. ? Diabetes. ? Kidney disease.  Have HIV.  Have sickle cell disease.  Have had your spleen removed.  Do not take good care of your teeth and mouth (poor dental hygiene).  Have a medical condition that increases the risk of breathing in droplets from your own mouth and nose.  Have a weakened body defense system (immune system).  Are a smoker.  Travel to areas where the germs that cause this illness are common.  Are around certain animals or the places they live. What are the signs or symptoms?  A dry cough.  A wet (productive) cough.  Fever.  Sweating.  Chest pain. This often happens when breathing deeply or coughing.  Fast breathing or trouble breathing.  Shortness of breath.  Shaking chills.  Feeling tired (fatigue).  Muscle aches. How is this treated? Treatment for this condition depends on many things. Most adults can be treated at home. In some cases, treatment must happen in a hospital. Treatment may include:  Medicines given by mouth or through an IV tube.  Being given extra oxygen.  Respiratory therapy. In rare cases, treatment for very bad pneumonia  may include:  Using a machine to help you breathe.  Having a procedure to remove fluid from around your lungs. Follow these instructions at home: Medicines  Take over-the-counter and prescription medicines only as told by your doctor. ? Only take cough medicine if you are losing sleep.  If you were prescribed an antibiotic medicine, take it as told by your doctor. Do not stop taking the antibiotic even if you start to feel better. General instructions   Sleep with your head and neck raised (elevated). You can do this by sleeping in a recliner or by putting a few pillows under your head.  Rest as needed. Get at least 8 hours of sleep each night.  Drink enough water to keep your pee (urine) pale yellow.  Eat a healthy diet that includes plenty of vegetables, fruits, whole grains, low-fat dairy products, and lean protein.  Do not use any products that contain nicotine or tobacco. These include cigarettes, e-cigarettes, and chewing tobacco. If you need help quitting, ask your doctor.  Keep all follow-up visits as told by your doctor. This is important. How is this prevented? A shot (vaccine) can help prevent pneumonia. Shots are often suggested for:  People older than 69 years of age.  People older than 69 years of age who: ? Are having cancer treatment. ? Have long-term (chronic) lung disease. ? Have problems with their body's defense system. You may also prevent pneumonia if you take these actions:  Get the flu (influenza) shot every year.  Go to the dentist as   often as told.  Wash your hands often. If you cannot use soap and water, use hand sanitizer. Contact a doctor if:  You have a fever.  You lose sleep because your cough medicine does not help. Get help right away if:  You are short of breath and it gets worse.  You have more chest pain.  Your sickness gets worse. This is very serious if: ? You are an older adult. ? Your body's defense system is weak.  You  cough up blood. Summary  Pneumonia is an infection of the lungs.  Most adults can be treated at home. Some will need treatment in a hospital.  Drink enough water to keep your pee pale yellow.  Get at least 8 hours of sleep each night. This information is not intended to replace advice given to you by your health care provider. Make sure you discuss any questions you have with your health care provider. Document Revised: 02/14/2019 Document Reviewed: 06/22/2018 Elsevier Patient Education  2020 [employer]  

## 2020-07-21 NOTE — Progress Notes (Signed)
Virtual telephone visit    Virtual Visit via Telephone Note   This visit type was conducted due to national recommendations for restrictions regarding the COVID-19 Pandemic (e.g. social distancing) in an effort to limit this patient's exposure and mitigate transmission in our community. Due to his co-morbid illnesses, this patient is at least at moderate risk for complications without adequate follow up. This format is felt to be most appropriate for this patient at this time. The patient did not have access to video technology or had technical difficulties with video requiring transitioning to audio format only (telephone). Physical exam was limited to content and character of the telephone converstion.    I connected with Nicholas Nolan on 07/21/20 at  7:20 AM EDT by telephone and verified that I am speaking with the correct person using two identifiers.  I discussed the limitations of evaluation and management by telemedicine and the availability of in person appointments. The patient expressed understanding and agreed to proceed.   Patient location: Home Provider location: [facility]   Visit Date: 07/21/2020  Today's healthcare provider: Mar Daring, PA-C   Chief Complaint  Patient presents with  . Cough   Subjective    HPI  Patient has been having fever,cough, SOB, headaches. He had a covid test done Friday 9/3 it was negative.Chest xray was ordered and Zpak was sent in for patient 09/10 per patient he took the antibiotic and is finished.Reports that today he feels winded,sob, and thinks he had a temperature last night.   Reports his daughter listened to his lungs and said he had faint crackles in right (daughter is paramedic).    Patient Active Problem List   Diagnosis Date Noted  . OSA (obstructive sleep apnea) 05/30/2020  . Obesity (BMI 30.0-34.9) 05/30/2020  . Chronic kidney disease, stage 3b 05/30/2020  . Acute hyperkalemia   . Anemia   . Essential hypertension     . Basal cell carcinoma of skin 05/07/2016  . Benign prostatic hypertrophy with lower urinary tract symptoms (LUTS) 05/07/2016  . Abnormal prostate specific antigen 05/07/2016  . ED (erectile dysfunction) of organic origin 05/07/2016  . Hyperthyroidism 05/07/2016  . LBP (low back pain) 05/07/2016  . Allergic rhinitis, seasonal 05/07/2016  . Compulsive tobacco user syndrome 05/07/2016  . Diabetes ([place]) 04/24/2015  . Resistant hypertension 04/24/2015  . Hyperlipemia 04/21/2015  . Controlled type 2 diabetes mellitus without complication, without long-term current use of insulin ([place]) 12/09/2014  . HLD (hyperlipidemia) 12/09/2014  . History of lung cancer 12/09/2014   Past Medical History:  Diagnosis Date  . Arthritis    hands  . Back pain    leg pain  . CAD (coronary artery disease)    a. 10/2019 Cor CTA: LM nl, LAD nl, LCX distal dzs w/ FFR 0.65-0.75. RCA nl-->Med Rx.  . CKD (chronic kidney disease), stage III   . Claudication ([place])   . Diabetes mellitus    type 2  . Diastolic dysfunction    a. 08/2019 Echo: EF 60-65%, mild LVH, impaired relaxation. Nl RV size/fxn. Nl LA szie.  Marland Kitchen History of urinary retention   . Hyperlipidemia   . Hypertension    a. 05/2019 renal u/s concerning for R RAS; b. 06/2019 Renal artery angio: no more than15% bilat RAS.  Marland Kitchen Neuropathy    feet  . Primary cancer of right upper lobe of lung ([employer]) 2014   RUL Lobectomy  . Wears dentures    full upper  Medications: Outpatient Medications Prior to Visit  Medication Sig  . amLODipine (NORVASC) 10 MG tablet Take 1 tablet (10 mg total) by mouth daily.  Marland Kitchen atorvastatin (LIPITOR) 80 MG tablet Take by mouth.  . Dulaglutide (TRULICITY) 1.5 JQ/7.3AL SOPN Inject 1.5 mg into the skin once a week.   Marland Kitchen FARXIGA 10 MG TABS tablet Take 10 mg by mouth every morning.  Marland Kitchen glimepiride (AMARYL) 4 MG tablet Take 0.5 tablets (2 mg total) by mouth daily.  . hydrALAZINE (APRESOLINE) 50 MG tablet Take 1 tablet (50 mg total)  by mouth 3 (three) times daily.  . isosorbide mononitrate (IMDUR) 60 MG 24 hr tablet Take 1 tablet (60 mg total) by mouth daily.  . nitroGLYCERIN (NITROSTAT) 0.4 MG SL tablet Place 1 tablet (0.4 mg total) under the tongue every 5 (five) minutes as needed for chest pain.  . tamsulosin (FLOMAX) 0.4 MG CAPS capsule TAKE 1 CAPSULE (0.4 MG TOTAL) BY MOUTH DAILY AFTER SUPPER.  . verapamil (CALAN-SR) 240 MG CR tablet Take 1 tablet (240 mg total) by mouth at bedtime.  Marland Kitchen azithromycin (ZITHROMAX) 250 MG tablet Take 2 tablets PO on day one, and one tablet PO daily thereafter until completed. (Patient not taking: Reported on 07/21/2020)  . glucose blood (ONETOUCH VERIO) test strip USE ONCE DAILY. USE AS INSTRUCTED.   No facility-administered medications prior to visit.    Review of Systems  Last CBC Lab Results  Component Value Date   WBC 9.3 10/03/2019   HGB 10.5 (L) 10/03/2019   HCT 31.7 (L) 10/03/2019   MCV 89.8 10/03/2019   MCH 29.7 10/03/2019   RDW 13.3 10/03/2019   PLT 231 93/79/0240   Last metabolic panel Lab Results  Component Value Date   GLUCOSE 141 (H) 10/19/2019   NA 140 10/19/2019   K 5.3 (H) 10/19/2019   CL 105 10/19/2019   CO2 20 10/19/2019   BUN 30 (H) 10/19/2019   CREATININE 2.30 (H) 01/10/2020   GFRNONAA 44 (L) 10/19/2019   GFRAA 51 (L) 10/19/2019   CALCIUM 9.6 10/19/2019   PROT 6.5 10/19/2019   ALBUMIN 4.3 10/19/2019   LABGLOB 2.2 10/19/2019   AGRATIO 2.0 10/19/2019   BILITOT <0.2 10/19/2019   ALKPHOS 111 10/19/2019   AST 12 10/19/2019   ALT 10 10/19/2019   ANIONGAP 10 10/03/2019      Objective    There were no vitals taken for this visit. BP Readings from Last 3 Encounters:  07/02/20 (!) 132/50  06/09/20 (!) 168/64  05/30/20 (!) 165/68   Wt Readings from Last 3 Encounters:  07/02/20 219 lb (99.3 kg)  06/09/20 215 lb 8 oz (97.8 kg)  05/30/20 (!) 213 lb (96.6 kg)     CLINICAL DATA:  Shortness of breath.  Cough.  Fever.  EXAM: CHEST - 2  VIEW  COMPARISON:  CT 10/11/2019.  Chest x-ray 10/03/2012.  FINDINGS: Mediastinum and hilar structures normal. Stable cardiomegaly. No pulmonary venous congestion. Mild right mid lung infiltrate cannot be excluded. Postsurgical changes right lung. Right base pleural-parenchymal thickening most likely secondary to scarring. No pleural effusion or pneumothorax. Prior partial resection right posterior 6 rib. Postsurgical changes right shoulder.  IMPRESSION: 1.  Mild right mid lung infiltrate cannot be excluded.  2. Postsurgical changes right lung. Right base pleural-parenchymal thickening most likely secondary to scarring.   Electronically Signed   By: Marcello Moores  Register   On: 07/18/2020 05:46    Assessment & Plan     1. Pneumonia of left lower lobe  due to infectious organism Improved some on Zpak but once completed symptoms returned. Will treat with Augmentin. Albuterol for SOB, wheezing. Will add furosemide daily x 10 days to make sure not having fluid overload, possible heart failure exacerbation due to patient's cardiovascular history. Advised to call if symptoms worsen or fail to improve.  - albuterol (VENTOLIN HFA) 108 (90 Base) MCG/ACT inhaler; Inhale 1-2 puffs into the lungs every 4 (four) hours as needed for wheezing or shortness of breath.  Dispense: 8 g; Refill: 0 - amoxicillin-clavulanate (AUGMENTIN) 875-125 MG tablet; Take 1 tablet by mouth 2 (two) times daily.  Dispense: 20 tablet; Refill: 0 - furosemide (LASIX) 20 MG tablet; Take 1 tablet (20 mg total) by mouth daily.  Dispense: 10 tablet; Refill: 0   No follow-ups on file.    I discussed the assessment and treatment plan with the patient. The patient was provided an opportunity to ask questions and all were answered. The patient agreed with the plan and demonstrated an understanding of the instructions.   The patient was advised to call back or seek an in-person evaluation if the symptoms worsen or if the  condition fails to improve as anticipated.  I provided 13 minutes of non-face-to-face time during this encounter.  Reynolds Bowl, PA-C, have reviewed all documentation for this visit. The documentation on 07/21/20 for the exam, diagnosis, procedures, and orders are all accurate and complete.  [place] [facility] [phone] (phone) [phone] (fax)  [place]

## 2020-08-08 ENCOUNTER — Other Ambulatory Visit: Payer: Self-pay | Admitting: Physician Assistant

## 2020-08-08 DIAGNOSIS — J189 Pneumonia, unspecified organism: Secondary | ICD-10-CM

## 2020-08-11 ENCOUNTER — Ambulatory Visit (INDEPENDENT_AMBULATORY_CARE_PROVIDER_SITE_OTHER): Payer: Medicare PPO

## 2020-08-11 ENCOUNTER — Other Ambulatory Visit: Payer: Self-pay

## 2020-08-11 DIAGNOSIS — I739 Peripheral vascular disease, unspecified: Secondary | ICD-10-CM

## 2020-08-13 ENCOUNTER — Telehealth: Payer: Self-pay

## 2020-08-13 DIAGNOSIS — I739 Peripheral vascular disease, unspecified: Secondary | ICD-10-CM

## 2020-08-13 NOTE — Telephone Encounter (Signed)
Spoke with patient regarding the following result note:  Nicholas Gianotti, NP  P [facility] [place] Triage Abnormal study with suggestion of severe bilateral disease. This very likely explains his leg pain symptoms. Please arrange for new PV eval with Dr. Fletcher Anon.   Patient verbalized understanding and agreed with plan. Will forward to scheduling for patient to be scheduled.ref

## 2020-08-14 ENCOUNTER — Encounter: Payer: Self-pay | Admitting: Cardiology

## 2020-08-14 ENCOUNTER — Ambulatory Visit: Payer: Medicare PPO | Admitting: Cardiology

## 2020-08-14 ENCOUNTER — Other Ambulatory Visit: Payer: Self-pay

## 2020-08-14 VITALS — BP 152/54 | HR 73 | Ht 70.0 in | Wt 209.4 lb

## 2020-08-14 DIAGNOSIS — I2510 Atherosclerotic heart disease of native coronary artery without angina pectoris: Secondary | ICD-10-CM | POA: Diagnosis not present

## 2020-08-14 DIAGNOSIS — I739 Peripheral vascular disease, unspecified: Secondary | ICD-10-CM | POA: Diagnosis not present

## 2020-08-14 DIAGNOSIS — E785 Hyperlipidemia, unspecified: Secondary | ICD-10-CM

## 2020-08-14 DIAGNOSIS — I10 Essential (primary) hypertension: Secondary | ICD-10-CM

## 2020-08-14 MED ORDER — ASPIRIN EC 81 MG PO TBEC: 81.0000 mg | DELAYED_RELEASE_TABLET | Freq: Every day | ORAL | 3 refills | Status: DC

## 2020-08-14 MED ORDER — HYDRALAZINE HCL 100 MG PO TABS: 100.0000 mg | ORAL_TABLET | Freq: Three times a day (TID) | ORAL | 3 refills | Status: DC

## 2020-08-14 NOTE — Patient Instructions (Signed)
Medication Instructions:  Your physician has recommended you make the following change in your medication:   1)  STOP taking verapamil (CALAN-SR) 240 MG CR tablet. 2)  INCREASE your hydrALAZINE (APRESOLINE) to 100 MG: Take 1 tablet (100 mg total) by mouth 3 (three) times daily 3)  START ASA: Take 1 tablet (81 mg total) by mouth daily.    *If you need a refill on your cardiac medications before your next appointment, please call your pharmacy*   Lab Work:  Your physician recommends that you return for a FASTING lipid profile: at your earliest convenience.   If you have labs (blood work) drawn today and your tests are completely normal, you will receive your results only by: Marland Kitchen MyChart Message (if you have MyChart) OR . A paper copy in the mail If you have any lab test that is abnormal or we need to change your treatment, we will call you to review the results.   Testing/Procedures: None Ordered   Follow-Up: At [facility], you and your health needs are our priority.  As part of our continuing mission to provide you with exceptional heart care, we have created designated Provider Care Teams.  These Care Teams include your primary Cardiologist (physician) and Advanced Practice Providers (APPs -  Physician Assistants and Nurse Practitioners) who all work together to provide you with the care you need, when you need it.  We recommend signing up for the patient portal called "MyChart".  Sign up information is provided on this After Visit Summary.  MyChart is used to connect with patients for Virtual Visits (Telemedicine).  Patients are able to view lab/test results, encounter notes, upcoming appointments, etc.  Non-urgent messages can be sent to your provider as well.   To learn more about what you can do with MyChart, go to [email or web address].    Your next appointment:   5 week(s)  The format for your next appointment:   In Person  Provider:   Kate Sable,  MD   Other Instructions

## 2020-08-14 NOTE — Progress Notes (Signed)
Cardiology Office Note:    Date:  08/14/2020   ID:  Nicholas Nolan, DOB 06/05/1951, MRN [id number]  PCP:  Mar Daring, PA-C  Cardiologist:  Kate Sable, MD  Electrophysiologist:  None   Referring MD: Florian Buff*   Chief Complaint  Patient presents with  . other    6 week follow up. Meds reviewed by the pt. verbally. Pt. c/o elevated blood pressure.     History of Present Illness:    Nicholas Nolan is a 69 y.o. male with a hx of CAD (CCTA 10/2019-Ca score 1689, mid LAD, LCx disease, FFRct with no sig obstruction), PAD, hypertension, diabetes, hyperlipidemia, former smoker x40+ years who presents due to difficulty in controlling hypertension.  Patient states blood pressure at home currently in the 094B to 096G systolic.  Previously was on a diuretic but this was stopped due to worsening renal function.  He is scheduled to see nephrologist.  Patient also complains of thigh pain and calf pain over the past months with exertion.  Had a peripheral arterial ultrasound showing peripheral arterial disease.  Is scheduled to see interventionalist next week.   Prior notes he notes sometimes feeling dizzy, fatigued, sitting starting new blood pressure regimen a few weeks ago. Renal ultrasound and renal angiogram were performed 06/2019 to evaluate for possible secondary causes but that was normal  Echo 08/2019 showed normal systolic function, EF 60 to 65%, impaired relaxation, mild LVH.   Past Medical History:  Diagnosis Date  . Arthritis    hands  . Back pain    leg pain  . CAD (coronary artery disease)    a. 10/2019 Cor CTA: LM nl, LAD nl, LCX distal dzs w/ FFR 0.65-0.75. RCA nl-->Med Rx.  . CKD (chronic kidney disease), stage III ([place])   . Claudication ([place])   . Diabetes mellitus    type 2  . Diastolic dysfunction    a. 08/2019 Echo: EF 60-65%, mild LVH, impaired relaxation. Nl RV size/fxn. Nl LA szie.  Marland Kitchen History of urinary retention   . Hyperlipidemia    . Hypertension    a. 05/2019 renal u/s concerning for R RAS; b. 06/2019 Renal artery angio: no more than15% bilat RAS.  Marland Kitchen Neuropathy    feet  . Primary cancer of right upper lobe of lung ([place]) 2014   RUL Lobectomy  . Wears dentures    full upper    Past Surgical History:  Procedure Laterality Date  . BASAL CELL CARCINOMA EXCISION    . COLONOSCOPY WITH PROPOFOL N/A 12/25/2019   Procedure: COLONOSCOPY WITH PROPOFOL;  Surgeon: Lin Landsman, MD;  Location: [place];  Service: Endoscopy;  Laterality: N/A;  Diabetic - injectable and oral meds  . LUMBAR LAMINECTOMY/DECOMPRESSION MICRODISCECTOMY  01/10/2012   Procedure: LUMBAR LAMINECTOMY/DECOMPRESSION MICRODISCECTOMY 2 LEVELS;  Surgeon: Hosie Spangle, MD;  Location: [place] [facility];  Service: Neurosurgery;  Laterality: Bilateral;  Lumbar four-Sacral One laminectomies  . LUNG SURGERY Right 11/22/12   right upper lobe  . POLYPECTOMY N/A 12/25/2019   Procedure: POLYPECTOMY;  Surgeon: Lin Landsman, MD;  Location: [place];  Service: Endoscopy;  Laterality: N/A;  . PROSTATE SURGERY     biopsy-due to elelvated PSA  . RENAL ANGIOGRAPHY Right 07/02/2019   Procedure: RENAL ANGIOGRAPHY;  Surgeon: Algernon Huxley, MD;  Location: [place] [facility];  Service: Cardiovascular;  Laterality: Right;  . SHOULDER SURGERY    . VASECTOMY      Current Medications:  Current Meds  Medication Sig  . albuterol (VENTOLIN HFA) 108 (90 Base) MCG/ACT inhaler INHALE 1-2 PUFFS INTO THE LUNGS EVERY 4 (FOUR) HOURS AS NEEDED FOR WHEEZING OR SHORTNESS OF BREATH.  Marland Kitchen amLODipine (NORVASC) 10 MG tablet Take 1 tablet (10 mg total) by mouth daily.  Marland Kitchen atorvastatin (LIPITOR) 80 MG tablet Take 80 mg by mouth daily.   . Dulaglutide (TRULICITY) 1.5 DU/2.0UR SOPN Inject 1.5 mg into the skin once a week.   Marland Kitchen FARXIGA 10 MG TABS tablet Take 10 mg by mouth every morning.  Marland Kitchen glimepiride (AMARYL) 2 MG tablet Take 2 mg by mouth daily with breakfast.   . glucose  blood (ONETOUCH VERIO) test strip USE ONCE DAILY. USE AS INSTRUCTED.  . hydrALAZINE (APRESOLINE) 100 MG tablet Take 1 tablet (100 mg total) by mouth 3 (three) times daily.  . isosorbide mononitrate (IMDUR) 60 MG 24 hr tablet Take 1 tablet (60 mg total) by mouth daily.  . nitroGLYCERIN (NITROSTAT) 0.4 MG SL tablet Place 1 tablet (0.4 mg total) under the tongue every 5 (five) minutes as needed for chest pain.  . tamsulosin (FLOMAX) 0.4 MG CAPS capsule TAKE 1 CAPSULE (0.4 MG TOTAL) BY MOUTH DAILY AFTER SUPPER.  . verapamil (CALAN-SR) 240 MG CR tablet Take 1 tablet (240 mg total) by mouth at bedtime.  . [DISCONTINUED] hydrALAZINE (APRESOLINE) 50 MG tablet Take 1 tablet (50 mg total) by mouth 3 (three) times daily.     Allergies:   Codeine, Hydromorphone, and Morphine and related   Social History   Socioeconomic History  . Marital status: Married    Spouse name: Not on file  . Number of children: 4  . Years of education: Not on file  . Highest education level: Associate degree: occupational, Hotel manager, or vocational program  Occupational History  . Occupation: retired  Tobacco Use  . Smoking status: Former Smoker    Packs/day: 2.00    Years: 45.00    Pack years: 90.00    Types: Cigarettes    Quit date: 11/28/2012    Years since quitting: 7.7  . Smokeless tobacco: Current User    Types: Snuff  . Tobacco comment: Quit in 2014  Vaping Use  . Vaping Use: Never used  Substance and Sexual Activity  . Alcohol use: No  . Drug use: No  . Sexual activity: Yes    Birth control/protection: None  Other Topics Concern  . Not on file  Social History Narrative  . Not on file   Social Determinants of Health   Financial Resource Strain: Low Risk   . Difficulty of Paying Living Expenses: Not hard at all  Food Insecurity: No Food Insecurity  . Worried About Charity fundraiser in the Last Year: Never true  . Ran Out of Food in the Last Year: Never true  Transportation Needs: No  Transportation Needs  . Lack of Transportation (Medical): No  . Lack of Transportation (Non-Medical): No  Physical Activity: Inactive  . Days of Exercise per Week: 0 days  . Minutes of Exercise per Session: 0 min  Stress: No Stress Concern Present  . Feeling of Stress : Not at all  Social Connections: Moderately Integrated  . Frequency of Communication with Friends and Family: More than three times a week  . Frequency of Social Gatherings with Friends and Family: More than three times a week  . Attends Religious Services: More than 4 times per year  . Active Member of Clubs or Organizations: No  .  Attends Archivist Meetings: Never  . Marital Status: Married     Family History: The patient's family history includes Cancer in his brother and father; Heart disease in his father; Stroke in his father.  ROS:   Please see the history of present illness.     All other systems reviewed and are negative.  EKGs/Labs/Other Studies Reviewed:    The following studies were reviewed today:   EKG:  EKG is  ordered today.  The ekg ordered today demonstrates normal sinus rhythm  Recent Labs: 10/03/2019: Hemoglobin 10.5; Platelets 231 10/19/2019: ALT 10; BUN 30; Potassium 5.3; Sodium 140 01/10/2020: Creatinine, Ser 2.30 06/09/2020: TSH 0.877  Recent Lipid Panel    Component Value Date/Time   CHOL 221 (H) 10/19/2019 1046   TRIG 153 (H) 10/19/2019 1046   HDL 43 10/19/2019 1046   CHOLHDL 5.1 (H) 10/19/2019 1046   LDLCALC 150 (H) 10/19/2019 1046    Physical Exam:    VS:  BP (!) 152/54 (BP Location: Left Arm, Patient Position: Sitting, Cuff Size: Normal)   Pulse 73   Ht 5\' 10"  (1.778 m)   Wt 209 lb 6 oz (95 kg)   SpO2 98%   BMI 30.04 kg/m     Wt Readings from Last 3 Encounters:  08/14/20 209 lb 6 oz (95 kg)  07/02/20 219 lb (99.3 kg)  06/09/20 215 lb 8 oz (97.8 kg)     GEN:  Well nourished, well developed in no acute distress HEENT: Normal NECK: No JVD; No carotid  bruits LYMPHATICS: No lymphadenopathy CARDIAC: RRR, no murmurs, rubs, gallops RESPIRATORY:  Clear to auscultation without rales, wheezing or rhonchi  ABDOMEN: Soft, non-tender, non-distended MUSCULOSKELETAL:  No edema; No deformity  SKIN: Warm and dry NEUROLOGIC:  Alert and oriented x 3 PSYCHIATRIC:  Normal affect   ASSESSMENT:    1. Uncontrolled hypertension   2. Coronary artery disease involving native coronary artery of native heart without angina pectoris   3. Hyperlipidemia, unspecified hyperlipidemia type   4. PAD (peripheral artery disease) (HCC)    PLAN:    In order of problems listed above:  1. Patient with uncontrolled blood pressure.  BP still elevated.  Increase hydralazine to 100 mg 3 times daily.  Continue amlodipine 10 mg daily, Imdur 60 mg daily.  Stop verapamil as patient is on amlodipine. 2. History nonobstructive CAD.  Denies chest pain.  Start aspirin 81 mg daily, continue Lipitor 80.  Last echo showed normal systolic and diastolic function, EF 66% 3. History of hyperlipidemia, continue statin.  Check fasting lipid profile. 4. Peripheral arterial disease, claudication.  Aspirin statin.  Keep appointment with vascular specialist/interventionalists.  Follow-up in 5 weeks.  Total encounter time 40 minutes  Greater than 50% was spent in counseling and coordination of care with the patient    Medication Adjustments/Labs and Tests Ordered: Current medicines are reviewed at length with the patient today.  Concerns regarding medicines are outlined above.  Orders Placed This Encounter  Procedures  . Lipid panel  . EKG 12-Lead   Meds ordered this encounter  Medications  . hydrALAZINE (APRESOLINE) 100 MG tablet    Sig: Take 1 tablet (100 mg total) by mouth 3 (three) times daily.    Dispense:  90 tablet    Refill:  3    Dose increase  . aspirin EC 81 MG tablet    Sig: Take 1 tablet (81 mg total) by mouth daily. Swallow whole.    Dispense:  90 tablet  Refill:  3    Patient Instructions  Medication Instructions:  Your physician has recommended you make the following change in your medication:   1)  STOP taking verapamil (CALAN-SR) 240 MG CR tablet. 2)  INCREASE your hydrALAZINE (APRESOLINE) to 100 MG: Take 1 tablet (100 mg total) by mouth 3 (three) times daily 3)  START ASA: Take 1 tablet (81 mg total) by mouth daily.    *If you need a refill on your cardiac medications before your next appointment, please call your pharmacy*   Lab Work:  Your physician recommends that you return for a FASTING lipid profile: at your earliest convenience.   If you have labs (blood work) drawn today and your tests are completely normal, you will receive your results only by: Marland Kitchen MyChart Message (if you have MyChart) OR . A paper copy in the mail If you have any lab test that is abnormal or we need to change your treatment, we will call you to review the results.   Testing/Procedures: None Ordered   Follow-Up: At [facility], you and your health needs are our priority.  As part of our continuing mission to provide you with exceptional heart care, we have created designated Provider Care Teams.  These Care Teams include your primary Cardiologist (physician) and Advanced Practice Providers (APPs -  Physician Assistants and Nurse Practitioners) who all work together to provide you with the care you need, when you need it.  We recommend signing up for the patient portal called "MyChart".  Sign up information is provided on this After Visit Summary.  MyChart is used to connect with patients for Virtual Visits (Telemedicine).  Patients are able to view lab/test results, encounter notes, upcoming appointments, etc.  Non-urgent messages can be sent to your provider as well.   To learn more about what you can do with MyChart, go to [email or web address].    Your next appointment:   5 week(s)  The format for your next appointment:   In  Person  Provider:   Kate Sable, MD   Other Instructions      Signed, Kate Sable, MD  08/14/2020 12:31 PM    [place]

## 2020-08-21 ENCOUNTER — Telehealth: Payer: Self-pay

## 2020-08-21 ENCOUNTER — Encounter: Payer: Self-pay | Admitting: Cardiovascular Disease

## 2020-08-21 ENCOUNTER — Other Ambulatory Visit
Admission: RE | Admit: 2020-08-21 | Discharge: 2020-08-21 | Disposition: A | Discharge status: Home / Self Care | Payer: Medicare PPO | Attending: Cardiology | Admitting: Cardiology

## 2020-08-21 ENCOUNTER — Ambulatory Visit: Payer: Medicare PPO | Admitting: Cardiovascular Disease

## 2020-08-21 ENCOUNTER — Other Ambulatory Visit: Payer: Self-pay

## 2020-08-21 VITALS — BP 160/60 | HR 84 | Ht 70.0 in | Wt 208.4 lb

## 2020-08-21 DIAGNOSIS — E785 Hyperlipidemia, unspecified: Secondary | ICD-10-CM

## 2020-08-21 DIAGNOSIS — I10 Essential (primary) hypertension: Secondary | ICD-10-CM

## 2020-08-21 DIAGNOSIS — I493 Ventricular premature depolarization: Secondary | ICD-10-CM | POA: Diagnosis not present

## 2020-08-21 DIAGNOSIS — I739 Peripheral vascular disease, unspecified: Secondary | ICD-10-CM

## 2020-08-21 LAB — LIPID PANEL
Cholesterol: 126 mg/dL (ref 0–200)
HDL: 48 mg/dL (ref >40)
LDL Cholesterol: 60 mg/dL (ref 0–99)
Total CHOL/HDL Ratio: 2.6 RATIO
Triglycerides: 89 mg/dL (ref <150)
VLDL: 18 mg/dL (ref 0–40)

## 2020-08-21 MED ORDER — CILOSTAZOL 50 MG PO TABS: 50.0000 mg | ORAL_TABLET | Freq: Two times a day (BID) | ORAL | 6 refills | Status: DC

## 2020-08-21 NOTE — Telephone Encounter (Signed)
Call attempted to discuss results with patient. Left voicemail message to call back for results-results also released to My Chart.

## 2020-08-21 NOTE — Patient Instructions (Addendum)
Medication Instructions:  - Your physician has recommended you make the following change in your medication:   1) Start pletal (cilostazol) 50 mg- take 1 tablet by mouth twice daily   *If you need a refill on your cardiac medications before your next appointment, please call your pharmacy*   Lab Work: - none ordered  If you have labs (blood work) drawn today and your tests are completely normal, you will receive your results only by: Marland Kitchen MyChart Message (if you have MyChart) OR . A paper copy in the mail If you have any lab test that is abnormal or we need to change your treatment, we will call you to review the results.   Testing/Procedures: - none ordered   Follow-Up: At [facility], you and your health needs are our priority.  As part of our continuing mission to provide you with exceptional heart care, we have created designated Provider Care Teams.  These Care Teams include your primary Cardiologist (physician) and Advanced Practice Providers (APPs -  Physician Assistants and Nurse Practitioners) who all work together to provide you with the care you need, when you need it.  We recommend signing up for the patient portal called "MyChart".  Sign up information is provided on this After Visit Summary.  MyChart is used to connect with patients for Virtual Visits (Telemedicine).  Patients are able to view lab/test results, encounter notes, upcoming appointments, etc.  Non-urgent messages can be sent to your provider as well.   To learn more about what you can do with MyChart, go to [email or web address].    Your next appointment:   4 month(s)  The format for your next appointment:   In Person  Provider:   Kathlyn Sacramento, MD (only)   Other Instructions n/a

## 2020-08-21 NOTE — Progress Notes (Signed)
Cardiology Office Note   Date:  08/21/2020   ID:  ARTH NICASTRO, DOB [birth year], MRN [id number]  PCP:  Mar Daring, PA-C  Cardiologist: Dr. Garen Lah  Chief Complaint  Patient presents with  . New Patient (Initial Visit)    Referred by Ignacia Bayley, NP for PVD--abnormal vascular results        History of Present Illness: Nicholas Nolan is a 69 y.o. male who was referred by Ignacia Bayley for evaluation management of peripheral arterial disease. He has known history of nonobstructive coronary artery disease on previous cardiac CTA with calcium score of 1689, peripheral arterial disease, essential hypertension, type 2 diabetes, hyperlipidemia and previous tobacco use.  He also has difficult to control hypertension and underlying chronic kidney disease.  Previous renal artery angiography and August 2020 showed less than 20% stenosis in bilateral renal arteries.  No significant iliac disease was reported. 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  The patient recently complained of burning and cramping in both thighs and calves.   Noninvasive vascular studies were performed and showed an ABI of 0.69 on the right and 0.60 on the left.  Duplex showed mild to moderate iliac disease, severe right mid SFA stenosis with peak velocity of 600.  On the left, the mid SFA was occluded with reconstitution above the knee.  He has underlying chronic kidney disease with most recent creatinine of 2.1 in August.  Past Medical History:  Diagnosis Date  . Arthritis    hands  . Back pain    leg pain  . CAD (coronary artery disease)    a. 10/2019 Cor CTA: LM nl, LAD nl, LCX distal dzs w/ FFR 0.65-0.75. RCA nl-->Med Rx.  . CKD (chronic kidney disease), stage III ([place])   . Claudication ([place])   . Diabetes mellitus    type 2  . Diastolic dysfunction    a. 08/2019 Echo: EF 60-65%, mild LVH, impaired relaxation. Nl RV  size/fxn. Nl LA szie.  Marland Kitchen History of urinary retention   . Hyperlipidemia   . Hypertension    a. 05/2019 renal u/s concerning for R RAS; b. 06/2019 Renal artery angio: no more than15% bilat RAS.  Marland Kitchen Neuropathy    feet  . Primary cancer of right upper lobe of lung ([place]) 2014   RUL Lobectomy  . Wears dentures    full upper    Past Surgical History:  Procedure Laterality Date  . BASAL CELL CARCINOMA EXCISION    . COLONOSCOPY WITH PROPOFOL N/A 12/25/2019   Procedure: COLONOSCOPY WITH PROPOFOL;  Surgeon: Lin Landsman, MD;  Location: [place];  Service: Endoscopy;  Laterality: N/A;  Diabetic - injectable and oral meds  . LUMBAR LAMINECTOMY/DECOMPRESSION MICRODISCECTOMY  01/10/2012   Procedure: LUMBAR LAMINECTOMY/DECOMPRESSION MICRODISCECTOMY 2 LEVELS;  Surgeon: Hosie Spangle, MD;  Location: [place] [facility];  Service: Neurosurgery;  Laterality: Bilateral;  Lumbar four-Sacral One laminectomies  . LUNG SURGERY Right 11/22/12   right upper lobe  . POLYPECTOMY N/A 12/25/2019   Procedure: POLYPECTOMY;  Surgeon: Lin Landsman, MD;  Location: [place];  Service: Endoscopy;  Laterality: N/A;  . PROSTATE SURGERY     biopsy-due to elelvated PSA  . RENAL ANGIOGRAPHY Right 07/02/2019   Procedure: RENAL ANGIOGRAPHY;  Surgeon: Algernon Huxley, MD;  Location: [place] [facility];  Service: Cardiovascular;  Laterality: Right;  . SHOULDER SURGERY    . VASECTOMY  Current Outpatient Medications  Medication Sig Dispense Refill  . aspirin EC 81 MG tablet Take 1 tablet (81 mg total) by mouth daily. Swallow whole. 90 tablet 3  . atorvastatin (LIPITOR) 80 MG tablet Take 80 mg by mouth daily.     . Dulaglutide (TRULICITY) 1.5 HE/5.2DP SOPN Inject 1.5 mg into the skin once a week.     Marland Kitchen FARXIGA 10 MG TABS tablet Take 10 mg by mouth every morning.    Marland Kitchen glimepiride (AMARYL) 2 MG tablet Take 2 mg by mouth daily with breakfast.     . glucose blood (ONETOUCH VERIO) test strip USE ONCE  DAILY. USE AS INSTRUCTED.    . hydrALAZINE (APRESOLINE) 100 MG tablet Take 1 tablet (100 mg total) by mouth 3 (three) times daily. 90 tablet 3  . isosorbide mononitrate (IMDUR) 60 MG 24 hr tablet Take 1 tablet (60 mg total) by mouth daily. 90 tablet 3  . nitroGLYCERIN (NITROSTAT) 0.4 MG SL tablet Place 1 tablet (0.4 mg total) under the tongue every 5 (five) minutes as needed for chest pain. 90 tablet 3  . tamsulosin (FLOMAX) 0.4 MG CAPS capsule TAKE 1 CAPSULE (0.4 MG TOTAL) BY MOUTH DAILY AFTER SUPPER. 90 capsule 1  . albuterol (VENTOLIN HFA) 108 (90 Base) MCG/ACT inhaler INHALE 1-2 PUFFS INTO THE LUNGS EVERY 4 (FOUR) HOURS AS NEEDED FOR WHEEZING OR SHORTNESS OF BREATH. (Patient not taking: Reported on 08/21/2020) 8 g 0  . amLODipine (NORVASC) 10 MG tablet Take 1 tablet (10 mg total) by mouth daily. 90 tablet 1   No current facility-administered medications for this visit.    Allergies:   Codeine, Hydromorphone, and Morphine and related    Social History:  The patient  reports that he quit smoking about 7 years ago. His smoking use included cigarettes. He has a 90.00 pack-year smoking history. His smokeless tobacco use includes snuff. He reports that he does not drink alcohol and does not use drugs.   Family History:  The patient's family history includes Cancer in his brother and father; Heart disease in his father; Stroke in his father.    ROS:  Please see the history of present illness.   Otherwise, review of systems are positive for none.   All other systems are reviewed and negative.    PHYSICAL EXAM: VS:  BP (!) 160/60   Ht 5\' 10"  (1.778 m)   Wt 208 lb 6.4 oz (94.5 kg)   SpO2 98%   BMI 29.90 kg/m  , BMI Body mass index is 29.9 kg/m. GEN: Well nourished, well developed, in no acute distress  HEENT: normal  Neck: no JVD, carotid bruits, or masses Cardiac: RRR; no murmurs, rubs, or gallops,no edema  Respiratory:  clear to auscultation bilaterally, normal work of breathing GI:  soft, nontender, nondistended, + BS MS: no deformity or atrophy  Skin: warm and dry, no rash Neuro:  Strength and sensation are intact Psych: euthymic mood, full affect Vascular: Femoral pulses +2 bilaterally.  Dorsalis pedis is +1 bilaterally.  Posterior tibial is +1 on the right and absent on the left  EKG:  EKG is ordered today. The ekg ordered today demonstrates normal sinus rhythm with frequent PVCs.  Moderate LVH.   Recent Labs: 10/03/2019: Hemoglobin 10.5; Platelets 231 10/19/2019: ALT 10; BUN 30; Potassium 5.3; Sodium 140 01/10/2020: Creatinine, Ser 2.30 06/09/2020: TSH 0.877    Lipid Panel    Component Value Date/Time   CHOL 221 (H) 10/19/2019 1046   TRIG 153 (H) 10/19/2019  1046   HDL 43 10/19/2019 1046   CHOLHDL 5.1 (H) 10/19/2019 1046   LDLCALC 150 (H) 10/19/2019 1046      Wt Readings from Last 3 Encounters:  08/21/20 208 lb 6.4 oz (94.5 kg)  08/14/20 209 lb 6 oz (95 kg)  07/02/20 219 lb (99.3 kg)        PAD Screen 07/24/2019  Previous PAD dx? No  Previous surgical procedure? No  Pain with walking? No  Feet/toe relief with dangling? No  Painful, non-healing ulcers? No  Extremities discolored? No      ASSESSMENT AND PLAN:  1.  Peripheral arterial disease: Moderate bilateral calf claudication due to SFA disease.  I discussed with him the natural history and management of claudication.  Recommend aggressive medical therapy.  He does have underlying chronic kidney disease and most recent creatinine was 2.  Thus, angiography should be left as a last resort due to risk of contrast-induced nephropathy.  I recommend starting a walking program and I also elected to add cilostazol 50 mg twice daily.  Reevaluate symptoms in few months.  2.  Asymptomatic PVCs: Verapamil was discontinued recently given that he is on amlodipine.  Given that his blood pressure is still uncontrolled, he might benefit from a beta-blocker.  3.  Hyperlipidemia: Continue treatment with a  statin with a target LDL of less than 70.  Most recent LDL was 150 but it appears that since then he has been on atorvastatin and will need a follow-up lipid profile in the near future.  4.  Essential hypertension: Still uncontrolled.  Consider adding carvedilol.    Disposition:   FU with me in 4 months  Signed,  Kathlyn Sacramento, MD  08/21/2020 11:53 AM    [place]

## 2020-08-25 DIAGNOSIS — U071 COVID-19: Secondary | ICD-10-CM | POA: Insufficient documentation

## 2020-08-28 ENCOUNTER — Other Ambulatory Visit: Payer: Medicare PPO

## 2020-09-01 ENCOUNTER — Telehealth: Payer: Medicare PPO | Admitting: Family Medicine

## 2020-09-01 ENCOUNTER — Ambulatory Visit: Payer: Self-pay | Admitting: *Deleted

## 2020-09-01 ENCOUNTER — Telehealth: Payer: Self-pay

## 2020-09-01 NOTE — Telephone Encounter (Signed)
Maybe we could fit in a virtual visit double booked somewhere this afternoon.  Advise them to take SpO2 if they have a meter.

## 2020-09-01 NOTE — Telephone Encounter (Signed)
  Reason for Disposition . [1] Difficulty breathing confirmed by triager BUT [2] not severe (Triage tip: Listen to the child's breathing.) . MODERATE difficulty breathing (e.g., speaks in phrases, SOB even at rest, pulse 100-120)  Answer Assessment - Initial Assessment Questions 1. COVID-19 DIAGNOSIS: "Who made your Coronavirus (COVID-19) diagnosis?" "Was it confirmed by a positive lab test?" If not diagnosed by a HCP, ask "Are there lots of cases (community spread) where you live?" (See public health department website, if unsure)     Pt tested positive 08/22/20 2. COVID-19 EXPOSURE: "Was there any known exposure to COVID before the symptoms began?" CDC Definition of close contact: within 6 feet (2 meters) for a total of 15 minutes or more over a 24-hour period.       3. ONSET: "When did the COVID-19 symptoms start?"       4. WORST SYMPTOM: "What is your worst symptom?" (e.g., cough, fever, shortness of breath, muscle aches)     Worsening shortness of breath of breath at rest 5. COUGH: "Do you have a cough?" If Yes, ask: "How bad is the cough?"        6. FEVER: "Do you have a fever?" If Yes, ask: "What is your temperature, how was it measured, and when did it start?"      7. RESPIRATORY STATUS: "Describe your breathing?" (e.g., shortness of breath, wheezing, unable to speak)      Shortness of breath at rest; 02 sats 93% 8. BETTER-SAME-WORSE: "Are you getting better, staying the same or getting worse compared to yesterday?"  If getting worse, ask, "In what way?"     worse 9. HIGH RISK DISEASE: "Do you have any chronic medical problems?" (e.g., asthma, heart or lung disease, weak immune system, obesity, etc.)  10. PREGNANCY: "Is there any chance you are pregnant?" "When was your last menstrual period?"        11. OTHER SYMPTOMS: "Do you have any other symptoms?"  (e.g., chills, fatigue, headache, loss of smell or taste, muscle pain, sore throat; new loss of smell or taste especially support  the diagnosis of COVID-19)  Protocols used: CORONAVIRUS (COVID-19) DIAGNOSED OR [place], CORONAVIRUS (COVID-19) DIAGNOSED OR SUSPECTED-A-AH

## 2020-09-01 NOTE — Telephone Encounter (Addendum)
Pt' and his wife called stating he tested positive for COVID 08/22/20,, and received an infusion 08/26/20 at [facility];  he is having shortness of breath but worsening today; it is having it rest; he can not find his allbuterol inhaler but used it a few days; she says the pt "is not moving good air", and his sats are 93% recommendations made per nurse triage protocol; his wife refused and would like a virtual appt; conference call initiated with Jiles Garter, and she reiterated the pt should be evaluated in the ED now for evaluation; the pt's wife insists that she speak with the physician;will route to office for notification and final disposition.

## 2020-09-01 NOTE — Telephone Encounter (Signed)
Patient's wife called in and states that the patient is having SOB with exertion and while at rest.  She spoke with the triage RN who advised they seek treatment at the ER or UC.  Wife stated that she preferred to speak with someone in the office.   I supported the RN's recommendation and the wife continued to refuse to take him to the ER and said she wanted a virtual visit with Dr. Jacinto Reap.  She was advised that Dr B did not have any appointments. She then said she wanted to speak with Dr B.   I explained to the wife that Dr. Jacinto Reap was seeing patients and does not usually stop and take time away from the office patient to make phone calls but I would send her a message.  Call back number is [phone]

## 2020-09-01 NOTE — Telephone Encounter (Signed)
Noted  

## 2020-09-01 NOTE — Telephone Encounter (Signed)
Called pt's wife to see if they could do a mychart visit with Dr. B @ 3pm. She reports that the pt is currently being seen by another doctor and they no longer need the appointment. Canceled the appt and advised Dr. Jacinto Reap.

## 2020-09-03 ENCOUNTER — Ambulatory Visit: Payer: Medicare PPO | Admitting: Urology

## 2020-09-08 ENCOUNTER — Telehealth: Payer: Self-pay | Admitting: Cardiovascular Disease

## 2020-09-08 MED ORDER — AMLODIPINE BESYLATE 10 MG PO TABS: 10.0000 mg | ORAL_TABLET | Freq: Every day | ORAL | 1 refills | Status: DC

## 2020-09-08 NOTE — Telephone Encounter (Signed)
Fax received from [facility] for refill of Amlodipine 10 mg QD. Rx request sent to pharmacy for 90 days w/ 1 refill.

## 2020-09-15 ENCOUNTER — Other Ambulatory Visit: Payer: Medicare PPO

## 2020-09-15 ENCOUNTER — Encounter: Payer: Self-pay | Admitting: Urology

## 2020-09-17 ENCOUNTER — Ambulatory Visit: Payer: Medicare PPO | Admitting: Urology

## 2020-09-18 ENCOUNTER — Encounter: Payer: Self-pay | Admitting: Cardiology

## 2020-09-18 ENCOUNTER — Other Ambulatory Visit: Payer: Self-pay

## 2020-09-18 ENCOUNTER — Ambulatory Visit: Payer: Medicare PPO | Admitting: Cardiology

## 2020-09-18 VITALS — BP 174/60 | HR 103 | Ht 70.0 in | Wt 195.0 lb

## 2020-09-18 DIAGNOSIS — I739 Peripheral vascular disease, unspecified: Secondary | ICD-10-CM

## 2020-09-18 DIAGNOSIS — I10 Essential (primary) hypertension: Secondary | ICD-10-CM

## 2020-09-18 DIAGNOSIS — I2510 Atherosclerotic heart disease of native coronary artery without angina pectoris: Secondary | ICD-10-CM

## 2020-09-18 DIAGNOSIS — E785 Hyperlipidemia, unspecified: Secondary | ICD-10-CM | POA: Diagnosis not present

## 2020-09-18 MED ORDER — CILOSTAZOL 100 MG PO TABS
100.0000 mg | ORAL_TABLET | Freq: Two times a day (BID) | ORAL | 5 refills | Status: DC
Start: 2020-09-18 — End: 2021-05-18

## 2020-09-18 MED ORDER — IRBESARTAN 150 MG PO TABS: 150.0000 mg | ORAL_TABLET | Freq: Every day | ORAL | 5 refills | Status: DC

## 2020-09-18 NOTE — Progress Notes (Signed)
Cardiology Office Note:    Date:  09/18/2020   ID:  Nicholas Nolan, DOB 24-Apr-1951, MRN [id number]  PCP:  Mar Daring, PA-C  Cardiologist:  Kate Sable, MD  Electrophysiologist:  None   Referring MD: Florian Buff*   Chief Complaint  Patient presents with  . Follow-up    5 weeks  Pt states no new Sx---states BP still high. States he had COVID 3 weeks ago---states he gets a little SOB and still feels a little weak, but doing well.    History of Present Illness:    Nicholas Nolan is a 69 y.o. male with a hx of CAD (CCTA 10/2019-Ca score 1689, mid LAD, LCx disease, FFRct with no sig obstruction), PAD, CKD, hypertension, diabetes, hyperlipidemia, former smoker x40+ years who presents due to difficulty in controlling hypertension and PAD.  Was started on cilostazol by IC to help with claudication.  Angiogram being defered for now due to risk of CIN.    He was diagnosed with Covid 3 weeks ago, not able to see nephrology.  Has appointment coming up.  States blood pressure still elevated at home with systolic in the 027O.  Still has symptoms of leg pain with walking.   Prior notes he notes sometimes feeling dizzy, fatigued, sitting starting new blood pressure regimen a few weeks ago. Renal ultrasound and renal angiogram were performed 06/2019 to evaluate for possible secondary causes but that was normal  Echo 08/2019 showed normal systolic function, EF 60 to 65%, impaired relaxation, mild LVH.   Past Medical History:  Diagnosis Date  . Arthritis    hands  . Back pain    leg pain  . CAD (coronary artery disease)    a. 10/2019 Cor CTA: LM nl, LAD nl, LCX distal dzs w/ FFR 0.65-0.75. RCA nl-->Med Rx.  . CKD (chronic kidney disease), stage III ([place])   . Claudication ([place])   . Diabetes mellitus    type 2  . Diastolic dysfunction    a. 08/2019 Echo: EF 60-65%, mild LVH, impaired relaxation. Nl RV size/fxn. Nl LA szie.  Marland Kitchen History of urinary retention   .  Hyperlipidemia   . Hypertension    a. 05/2019 renal u/s concerning for R RAS; b. 06/2019 Renal artery angio: no more than15% bilat RAS.  Marland Kitchen Neuropathy    feet  . Primary cancer of right upper lobe of lung ([place]) 2014   RUL Lobectomy  . Wears dentures    full upper    Past Surgical History:  Procedure Laterality Date  . BASAL CELL CARCINOMA EXCISION    . COLONOSCOPY WITH PROPOFOL N/A 12/25/2019   Procedure: COLONOSCOPY WITH PROPOFOL;  Surgeon: Lin Landsman, MD;  Location: [facility];  Service: Endoscopy;  Laterality: N/A;  Diabetic - injectable and oral meds  . LUMBAR LAMINECTOMY/DECOMPRESSION MICRODISCECTOMY  01/10/2012   Procedure: LUMBAR LAMINECTOMY/DECOMPRESSION MICRODISCECTOMY 2 LEVELS;  Surgeon: Hosie Spangle, MD;  Location: [facility];  Service: Neurosurgery;  Laterality: Bilateral;  Lumbar four-Sacral One laminectomies  . LUNG SURGERY Right 11/22/12   right upper lobe  . POLYPECTOMY N/A 12/25/2019   Procedure: POLYPECTOMY;  Surgeon: Lin Landsman, MD;  Location: [facility];  Service: Endoscopy;  Laterality: N/A;  . PROSTATE SURGERY     biopsy-due to elelvated PSA  . RENAL ANGIOGRAPHY Right 07/02/2019   Procedure: RENAL ANGIOGRAPHY;  Surgeon: Algernon Huxley, MD;  Location: [facility];  Service: Cardiovascular;  Laterality: Right;  .  SHOULDER SURGERY    . VASECTOMY      Current Medications: Current Meds  Medication Sig  . albuterol (VENTOLIN HFA) 108 (90 Base) MCG/ACT inhaler INHALE 1-2 PUFFS INTO THE LUNGS EVERY 4 (FOUR) HOURS AS NEEDED FOR WHEEZING OR SHORTNESS OF BREATH.  Marland Kitchen amLODipine (NORVASC) 10 MG tablet Take 1 tablet (10 mg total) by mouth daily.  Marland Kitchen aspirin EC 81 MG tablet Take 1 tablet (81 mg total) by mouth daily. Swallow whole.  Marland Kitchen atorvastatin (LIPITOR) 80 MG tablet Take 80 mg by mouth daily.   . cilostazol (PLETAL) 100 MG tablet Take 1 tablet (100 mg total) by mouth 2 (two) times daily.  . Dulaglutide (TRULICITY) 1.5 EY/8.1KG  SOPN Inject 1.5 mg into the skin once a week.   Marland Kitchen FARXIGA 10 MG TABS tablet Take 10 mg by mouth every morning.  Marland Kitchen glimepiride (AMARYL) 2 MG tablet Take 2 mg by mouth daily with breakfast.   . glucose blood (ONETOUCH VERIO) test strip USE ONCE DAILY. USE AS INSTRUCTED.  . hydrALAZINE (APRESOLINE) 100 MG tablet Take 1 tablet (100 mg total) by mouth 3 (three) times daily.  . isosorbide mononitrate (IMDUR) 60 MG 24 hr tablet Take 1 tablet (60 mg total) by mouth daily.  . nitroGLYCERIN (NITROSTAT) 0.4 MG SL tablet Place 1 tablet (0.4 mg total) under the tongue every 5 (five) minutes as needed for chest pain.  . tamsulosin (FLOMAX) 0.4 MG CAPS capsule TAKE 1 CAPSULE (0.4 MG TOTAL) BY MOUTH DAILY AFTER SUPPER.  . [DISCONTINUED] cilostazol (PLETAL) 50 MG tablet Take 1 tablet (50 mg total) by mouth 2 (two) times daily.     Allergies:   Codeine, Hydromorphone, and Morphine and related   Social History   Socioeconomic History  . Marital status: Married    Spouse name: Not on file  . Number of children: 4  . Years of education: Not on file  . Highest education level: Associate degree: occupational, Hotel manager, or vocational program  Occupational History  . Occupation: retired  Tobacco Use  . Smoking status: Former Smoker    Packs/day: 2.00    Years: 45.00    Pack years: 90.00    Types: Cigarettes    Quit date: 11/28/2012    Years since quitting: 7.8  . Smokeless tobacco: Current User    Types: Snuff  . Tobacco comment: Quit in 2014  Vaping Use  . Vaping Use: Never used  Substance and Sexual Activity  . Alcohol use: No  . Drug use: No  . Sexual activity: Yes    Birth control/protection: None  Other Topics Concern  . Not on file  Social History Narrative  . Not on file   Social Determinants of Health   Financial Resource Strain: Low Risk   . Difficulty of Paying Living Expenses: Not hard at all  Food Insecurity: No Food Insecurity  . Worried About Charity fundraiser in the Last  Year: Never true  . Ran Out of Food in the Last Year: Never true  Transportation Needs: No Transportation Needs  . Lack of Transportation (Medical): No  . Lack of Transportation (Non-Medical): No  Physical Activity: Inactive  . Days of Exercise per Week: 0 days  . Minutes of Exercise per Session: 0 min  Stress: No Stress Concern Present  . Feeling of Stress : Not at all  Social Connections: Moderately Integrated  . Frequency of Communication with Friends and Family: More than three times a week  . Frequency of Social  Gatherings with Friends and Family: More than three times a week  . Attends Religious Services: More than 4 times per year  . Active Member of Clubs or Organizations: No  . Attends Archivist Meetings: Never  . Marital Status: Married     Family History: The patient's family history includes Cancer in his brother and father; Heart disease in his father; Stroke in his father.  ROS:   Please see the history of present illness.     All other systems reviewed and are negative.  EKGs/Labs/Other Studies Reviewed:    The following studies were reviewed today:   EKG:  EKG not ordered today.    Recent Labs: 10/03/2019: Hemoglobin 10.5; Platelets 231 10/19/2019: ALT 10; BUN 30; Potassium 5.3; Sodium 140 01/10/2020: Creatinine, Ser 2.30 06/09/2020: TSH 0.877  Recent Lipid Panel    Component Value Date/Time   CHOL 126 08/21/2020 1246   CHOL 221 (H) 10/19/2019 1046   TRIG 89 08/21/2020 1246   HDL 48 08/21/2020 1246   HDL 43 10/19/2019 1046   CHOLHDL 2.6 08/21/2020 1246   VLDL 18 08/21/2020 1246   LDLCALC 60 08/21/2020 1246   LDLCALC 150 (H) 10/19/2019 1046    Physical Exam:    VS:  BP (!) 174/60   Pulse (!) 103   Ht 5\' 10"  (1.778 m)   Wt 195 lb (88.5 kg)   BMI 27.98 kg/m     Wt Readings from Last 3 Encounters:  09/18/20 195 lb (88.5 kg)  08/21/20 208 lb 6.4 oz (94.5 kg)  08/14/20 209 lb 6 oz (95 kg)     GEN:  Well nourished, well developed in  no acute distress HEENT: Normal NECK: No JVD; No carotid bruits LYMPHATICS: No lymphadenopathy CARDIAC: RRR, no murmurs, rubs, gallops RESPIRATORY:  Clear to auscultation without rales, wheezing or rhonchi  ABDOMEN: Soft, non-tender, non-distended MUSCULOSKELETAL:  No edema; No deformity  SKIN: Warm and dry NEUROLOGIC:  Alert and oriented x 3 PSYCHIATRIC:  Normal affect   ASSESSMENT:    1. Uncontrolled hypertension   2. Coronary artery disease involving native coronary artery of native heart without angina pectoris   3. Hyperlipidemia, unspecified hyperlipidemia type   4. PAD (peripheral artery disease) (HCC)    PLAN:    In order of problems listed above:  1. Patient with uncontrolled blood pressure.  BP still elevated.  Increase hydralazine to 100 mg 3 times daily.  Continue amlodipine 10 mg daily, Imdur 60 mg daily.  Start irbesartan 150 mg daily.  Patient has CKD with last urinalysis showing proteinuria.  ACE/ARB will be beneficial for patient.  Up to 25% increase in creatinine will be okay.  Check BMP in 2 weeks.  Keep follow-up with nephrology for additional input regarding CKD. 2. History nonobstructive CAD.  Denies chest pain.  aspirin 81 mg daily, continue Lipitor 80.  Last echo showed normal systolic and diastolic function, EF 66%.  LDL at goal. 3. History of hyperlipidemia, last LDL at goal of 60, total cholesterol controlled.  Continue Lipitor as prescribed. 4. Peripheral arterial disease, claudication.  Increase cilostazol to 100 mg twice daily.  Continue aspirin, Lipitor.   Follow-up in 1 months..  Total encounter time 40 minutes  Greater than 50% was spent in counseling and coordination of care with the patient    Medication Adjustments/Labs and Tests Ordered: Current medicines are reviewed at length with the patient today.  Concerns regarding medicines are outlined above.  Orders Placed This Encounter  Procedures  .  Basic metabolic panel   Meds ordered this  encounter  Medications  . cilostazol (PLETAL) 100 MG tablet    Sig: Take 1 tablet (100 mg total) by mouth 2 (two) times daily.    Dispense:  60 tablet    Refill:  5  . irbesartan (AVAPRO) 150 MG tablet    Sig: Take 1 tablet (150 mg total) by mouth daily.    Dispense:  30 tablet    Refill:  5    Patient Instructions  Medication Instructions:   Your physician has recommended you make the following change in your medication:   1.  INCREASE your cilostazol (PLETAL) to a 100 MG tablet: Take 1 tablet (100 mg total) by mouth 2 (two) times daily.  2.  START taking irbesartan (AVAPRO) 150 MG tablet: Take 1 tablet (150 mg total) by mouth daily.  *If you need a refill on your cardiac medications before your next appointment, please call your pharmacy*   Lab Work:  Your physician recommends that you return for lab work in: 2 weeks.  - Please go to the [facility]. You will check in at the front desk to the right as you walk into the atrium. Valet Parking is offered if needed. - No appointment needed. You may go any day between 7 am and 6 pm.  Testing/Procedures: None Ordered   Follow-Up: At [facility], you and your health needs are our priority.  As part of our continuing mission to provide you with exceptional heart care, we have created designated Provider Care Teams.  These Care Teams include your primary Cardiologist (physician) and Advanced Practice Providers (APPs -  Physician Assistants and Nurse Practitioners) who all work together to provide you with the care you need, when you need it.  We recommend signing up for the patient portal called "MyChart".  Sign up information is provided on this After Visit Summary.  MyChart is used to connect with patients for Virtual Visits (Telemedicine).  Patients are able to view lab/test results, encounter notes, upcoming appointments, etc.  Non-urgent messages can be sent to your provider as well.   To learn more about what you can do  with MyChart, go to [email or web address].    Your next appointment:   1 month(s)  The format for your next appointment:   In Person  Provider:   Kate Sable, MD   Other Instructions      Signed, Kate Sable, MD  09/18/2020 12:48 PM    [place]

## 2020-09-18 NOTE — Patient Instructions (Signed)
Medication Instructions:   Your physician has recommended you make the following change in your medication:   1.  INCREASE your cilostazol (PLETAL) to a 100 MG tablet: Take 1 tablet (100 mg total) by mouth 2 (two) times daily.  2.  START taking irbesartan (AVAPRO) 150 MG tablet: Take 1 tablet (150 mg total) by mouth daily.  *If you need a refill on your cardiac medications before your next appointment, please call your pharmacy*   Lab Work:  Your physician recommends that you return for lab work in: 2 weeks.  - Please go to the [facility]. You will check in at the front desk to the right as you walk into the atrium. Valet Parking is offered if needed. - No appointment needed. You may go any day between 7 am and 6 pm.  Testing/Procedures: None Ordered   Follow-Up: At [facility], you and your health needs are our priority.  As part of our continuing mission to provide you with exceptional heart care, we have created designated Provider Care Teams.  These Care Teams include your primary Cardiologist (physician) and Advanced Practice Providers (APPs -  Physician Assistants and Nurse Practitioners) who all work together to provide you with the care you need, when you need it.  We recommend signing up for the patient portal called "MyChart".  Sign up information is provided on this After Visit Summary.  MyChart is used to connect with patients for Virtual Visits (Telemedicine).  Patients are able to view lab/test results, encounter notes, upcoming appointments, etc.  Non-urgent messages can be sent to your provider as well.   To learn more about what you can do with MyChart, go to [email or web address].    Your next appointment:   1 month(s)  The format for your next appointment:   In Person  Provider:   Kate Sable, MD   Other Instructions

## 2020-09-29 ENCOUNTER — Other Ambulatory Visit: Payer: Self-pay | Admitting: Family Medicine

## 2020-09-29 DIAGNOSIS — R972 Elevated prostate specific antigen [PSA]: Secondary | ICD-10-CM

## 2020-10-06 ENCOUNTER — Other Ambulatory Visit: Payer: Self-pay | Admitting: Urology

## 2020-10-14 ENCOUNTER — Other Ambulatory Visit: Payer: Medicare PPO

## 2020-10-14 ENCOUNTER — Other Ambulatory Visit: Payer: Self-pay

## 2020-10-14 DIAGNOSIS — R972 Elevated prostate specific antigen [PSA]: Secondary | ICD-10-CM

## 2020-10-15 LAB — PSA: Prostate Specific Ag, Serum: 20 ng/mL — ABNORMAL HIGH (ref 0.0–4.0)

## 2020-10-17 ENCOUNTER — Ambulatory Visit: Payer: Medicare PPO | Admitting: Urology

## 2020-10-20 ENCOUNTER — Ambulatory Visit: Payer: Medicare PPO | Admitting: Urology

## 2020-10-20 ENCOUNTER — Encounter: Payer: Self-pay | Admitting: Urology

## 2020-10-20 ENCOUNTER — Other Ambulatory Visit: Payer: Self-pay

## 2020-10-20 VITALS — BP 143/55 | HR 83 | Ht 70.0 in | Wt 196.0 lb

## 2020-10-20 DIAGNOSIS — R972 Elevated prostate specific antigen [PSA]: Secondary | ICD-10-CM

## 2020-10-20 DIAGNOSIS — N401 Enlarged prostate with lower urinary tract symptoms: Secondary | ICD-10-CM

## 2020-10-20 DIAGNOSIS — N529 Male erectile dysfunction, unspecified: Secondary | ICD-10-CM

## 2020-10-20 LAB — URINALYSIS, COMPLETE
Bilirubin, UA: NEGATIVE
Ketones, UA: NEGATIVE
Leukocytes,UA: NEGATIVE
Nitrite, UA: NEGATIVE
RBC, UA: NEGATIVE
Specific Gravity, UA: 1.02 (ref 1.005–1.030)
Urobilinogen, Ur: 0.2 mg/dL (ref 0.2–1.0)
pH, UA: 5 (ref 5.0–7.5)

## 2020-10-20 LAB — MICROSCOPIC EXAMINATION
Bacteria, UA: NONE SEEN
Epithelial Cells (non renal): NONE SEEN /hpf (ref 0–10)

## 2020-10-20 MED ORDER — TAMSULOSIN HCL 0.4 MG PO CAPS
0.4000 mg | ORAL_CAPSULE | Freq: Every day | ORAL | 1 refills | Status: DC
Start: 2020-10-20 — End: 2021-04-15

## 2020-10-20 NOTE — Progress Notes (Signed)
10/20/2020 8:44 AM   Nicholas Nolan [birth year] [id number]  Referring provider: Mar Daring, PA-C [place],   [address]  Chief Complaint  Patient presents with  . Elevated PSA    Urologic history: 1.  Elevated PSA -Prostate biopsy 2010 PSA 5.4; volume 64 cc; benign -PSA 8.3 05/2016; MRI 103 cc volume, PI-RADS 3  -MR fusion biopsy 08/2016, 82 cc volume, ROI and 12 core template benign -PSA rise 16.8 10/2019; repeat PSA 12/2019 remains elevated 16.3 -Repeat MRI 128 cc gland; contrast not given secondary to GFR 2 "intermediate suspicious lesions" seen in left PZ with mild capsular bulging and 2 other lesions NTZ -MR fusion biopsy 02/2020; 139 cc gland; 4 biopsies each were taken of 3 ROI lesions in addition to a standard 12 core biopsy; all cores with benign prostate tissue and foci of acute/chronic inflammation  2.  BPH with history urinary retention -Successful voiding trial -On tamsulosin  HPI: 69 y.o. male presents for semiannual follow-up.   Ran out of tamsulosin approximately 2 weeks ago with worsening voiding symptoms including increased nocturia and urinary hesitancy  Denies dysuria, gross hematuria  No flank, abdominal or pelvic pain  PSA 10/14/2020 was 20.0   PMH: Past Medical History:  Diagnosis Date  . Arthritis    hands  . Back pain    leg pain  . CAD (coronary artery disease)    a. 10/2019 Cor CTA: LM nl, LAD nl, LCX distal dzs w/ FFR 0.65-0.75. RCA nl-->Med Rx.  . CKD (chronic kidney disease), stage III ([place])   . Claudication ([place])   . Diabetes mellitus    type 2  . Diastolic dysfunction    a. 08/2019 Echo: EF 60-65%, mild LVH, impaired relaxation. Nl RV size/fxn. Nl LA szie.  Marland Kitchen History of urinary retention   . Hyperlipidemia   . Hypertension    a. 05/2019 renal u/s concerning for R RAS; b. 06/2019 Renal artery angio: no more than15% bilat RAS.  Marland Kitchen Neuropathy    feet  . Primary cancer of right upper lobe of  lung ([place]) 2014   RUL Lobectomy  . Wears dentures    full upper    Surgical History: Past Surgical History:  Procedure Laterality Date  . BASAL CELL CARCINOMA EXCISION    . COLONOSCOPY WITH PROPOFOL N/A 12/25/2019   Procedure: COLONOSCOPY WITH PROPOFOL;  Surgeon: Lin Landsman, MD;  Location: [facility];  Service: Endoscopy;  Laterality: N/A;  Diabetic - injectable and oral meds  . LUMBAR LAMINECTOMY/DECOMPRESSION MICRODISCECTOMY  01/10/2012   Procedure: LUMBAR LAMINECTOMY/DECOMPRESSION MICRODISCECTOMY 2 LEVELS;  Surgeon: Hosie Spangle, MD;  Location: [facility];  Service: Neurosurgery;  Laterality: Bilateral;  Lumbar four-Sacral One laminectomies  . LUNG SURGERY Right 11/22/12   right upper lobe  . POLYPECTOMY N/A 12/25/2019   Procedure: POLYPECTOMY;  Surgeon: Lin Landsman, MD;  Location: [facility];  Service: Endoscopy;  Laterality: N/A;  . PROSTATE SURGERY     biopsy-due to elelvated PSA  . RENAL ANGIOGRAPHY Right 07/02/2019   Procedure: RENAL ANGIOGRAPHY;  Surgeon: Algernon Huxley, MD;  Location: [place];  Service: Cardiovascular;  Laterality: Right;  . SHOULDER SURGERY    . VASECTOMY      Home Medications:  Allergies as of 10/20/2020      Reactions   Codeine Itching   And hyper also   Hydromorphone Itching   Morphine And Related Itching      Medication  List       Accurate as of October 20, 2020  8:44 AM. If you have any questions, ask your nurse or doctor.        STOP taking these medications   glimepiride 2 MG tablet Commonly known as: AMARYL Stopped by: Abbie Sons, MD   nitroGLYCERIN 0.4 MG SL tablet Commonly known as: NITROSTAT Stopped by: Abbie Sons, MD     TAKE these medications   albuterol 108 (90 Base) MCG/ACT inhaler Commonly known as: VENTOLIN HFA INHALE 1-2 PUFFS INTO THE LUNGS EVERY 4 (FOUR) HOURS AS NEEDED FOR WHEEZING OR SHORTNESS OF BREATH.   amLODipine 10 MG tablet Commonly known as:  NORVASC Take 1 tablet (10 mg total) by mouth daily.   aspirin EC 81 MG tablet Take 1 tablet (81 mg total) by mouth daily. Swallow whole.   atorvastatin 80 MG tablet Commonly known as: LIPITOR Take 80 mg by mouth daily.   cilostazol 100 MG tablet Commonly known as: PLETAL Take 1 tablet (100 mg total) by mouth 2 (two) times daily.   Dulaglutide 1.5 MG/0.5ML Sopn Inject 1.5 mg into the skin once a week.   Farxiga 10 MG Tabs tablet Generic drug: dapagliflozin propanediol Take 10 mg by mouth every morning.   hydrALAZINE 100 MG tablet Commonly known as: APRESOLINE Take 1 tablet (100 mg total) by mouth 3 (three) times daily.   irbesartan 150 MG tablet Commonly known as: AVAPRO Take 1 tablet (150 mg total) by mouth daily.   isosorbide mononitrate 60 MG 24 hr tablet Commonly known as: IMDUR Take 1 tablet (60 mg total) by mouth daily.   OneTouch Verio test strip Generic drug: glucose blood USE ONCE DAILY. USE AS INSTRUCTED.   tamsulosin 0.4 MG Caps capsule Commonly known as: FLOMAX TAKE 1 CAPSULE (0.4 MG TOTAL) BY MOUTH DAILY AFTER SUPPER.       Allergies:  Allergies  Allergen Reactions  . Codeine Itching    And hyper also  . Hydromorphone Itching  . Morphine And Related Itching    Family History: Family History  Problem Relation Age of Onset  . Cancer Father        prostate  . Heart disease Father        MI  . Stroke Father   . Cancer Brother        liver    Social History:  reports that he quit smoking about 7 years ago. His smoking use included cigarettes. He has a 90.00 pack-year smoking history. His smokeless tobacco use includes snuff. He reports that he does not drink alcohol and does not use drugs.   Physical Exam: BP (!) 143/55   Pulse 83   Ht 5\' 10"  (1.778 m)   Wt 196 lb (88.9 kg)   BMI 28.12 kg/m   Constitutional:  Alert, No acute distress. HEENT:  AT, moist mucus membranes.  Trachea midline, no masses. Cardiovascular: No clubbing,  cyanosis, or edema. Respiratory: Normal respiratory effort, no increased work of breathing. GU: Prostate 60+ cc, asymmetry present L >R; mild increased firmness bilaterally previously noted Skin: No rashes, bruises or suspicious lesions. Neurologic: Grossly intact, no focal deficits, moving all 4 extremities. Psychiatric: Normal mood and affect.   Assessment & Plan:    1.  Elevated PSA  PSA last week significant elevated above baseline  Prior fusion biopsy in the last 6 months and he has been off tamsulosin for 2 weeks  Urinalysis ordered  Recheck PSA 6 weeks  Follow-up  office visit in 6 months  2.  BPH with LUTS  Worsening symptoms off tamsulosin  Refill sent   Abbie Sons, [place] [address], [place], [place] [address] [phone]

## 2020-10-23 ENCOUNTER — Telehealth: Payer: Self-pay | Admitting: Cardiology

## 2020-10-23 ENCOUNTER — Encounter: Payer: Self-pay | Admitting: Cardiology

## 2020-10-23 ENCOUNTER — Other Ambulatory Visit: Payer: Self-pay

## 2020-10-23 ENCOUNTER — Ambulatory Visit: Payer: Medicare PPO | Admitting: Cardiology

## 2020-10-23 ENCOUNTER — Other Ambulatory Visit: Payer: Self-pay | Admitting: Nephrology

## 2020-10-23 VITALS — BP 110/30 | HR 100 | Ht 70.0 in | Wt 192.1 lb

## 2020-10-23 DIAGNOSIS — E785 Hyperlipidemia, unspecified: Secondary | ICD-10-CM

## 2020-10-23 DIAGNOSIS — E1122 Type 2 diabetes mellitus with diabetic chronic kidney disease: Secondary | ICD-10-CM

## 2020-10-23 DIAGNOSIS — I739 Peripheral vascular disease, unspecified: Secondary | ICD-10-CM

## 2020-10-23 DIAGNOSIS — I2510 Atherosclerotic heart disease of native coronary artery without angina pectoris: Secondary | ICD-10-CM | POA: Diagnosis not present

## 2020-10-23 DIAGNOSIS — I10 Essential (primary) hypertension: Secondary | ICD-10-CM | POA: Diagnosis not present

## 2020-10-23 DIAGNOSIS — N1832 Chronic kidney disease, stage 3b: Secondary | ICD-10-CM

## 2020-10-23 MED ORDER — HYDRALAZINE HCL 100 MG PO TABS: 50.0000 mg | ORAL_TABLET | Freq: Three times a day (TID) | ORAL | 3 refills | Status: DC

## 2020-10-23 NOTE — Patient Instructions (Signed)
Medication Instructions:  Your physician has recommended you make the following change in your medication:   1.  DECREASE your Hydralazine to 50 MG three times a day.  *If you need a refill on your cardiac medications before your next appointment, please call your pharmacy*   Lab Work: None Ordered If you have labs (blood work) drawn today and your tests are completely normal, you will receive your results only by: Marland Kitchen MyChart Message (if you have MyChart) OR . A paper copy in the mail If you have any lab test that is abnormal or we need to change your treatment, we will call you to review the results.   Testing/Procedures: None Ordered   Follow-Up: At [facility], you and your health needs are our priority.  As part of our continuing mission to provide you with exceptional heart care, we have created designated Provider Care Teams.  These Care Teams include your primary Cardiologist (physician) and Advanced Practice Providers (APPs -  Physician Assistants and Nurse Practitioners) who all work together to provide you with the care you need, when you need it.  We recommend signing up for the patient portal called "MyChart".  Sign up information is provided on this After Visit Summary.  MyChart is used to connect with patients for Virtual Visits (Telemedicine).  Patients are able to view lab/test results, encounter notes, upcoming appointments, etc.  Non-urgent messages can be sent to your provider as well.   To learn more about what you can do with MyChart, go to [email or web address].    Your next appointment:   1-2 months    The format for your next appointment:   In Person  Provider:   You may see Kate Sable, MD or one of the following Advanced Practice Providers on your designated Care Team:    Murray Hodgkins, NP  Christell Faith, PA-C  Marrianne Mood, PA-C  Cadence Othello, [place]  Laurann Montana, NP    Other Instructions

## 2020-10-23 NOTE — Progress Notes (Signed)
Cardiology Office Note:    Date:  10/23/2020   ID:  Nicholas Nolan, DOB 07-May-1951, MRN [id number]  PCP:  Mar Daring, PA-C  Cardiologist:  Kate Sable, MD  Electrophysiologist:  None   Referring MD: Florian Buff*   Chief Complaint  Patient presents with  . Follow-up    1 month. Meds reviewed by the pt verbally. Patient c/o elevated blood pressure, chest tightness and shortness of breath and feeling fatigue.     History of Present Illness:    Nicholas Nolan is a 69 y.o. male with a hx of CAD (CCTA 10/2019-Ca score 1689, mid LAD, LCx disease, FFRct with no sig obstruction), PAD, CKD, hypertension, diabetes, hyperlipidemia, former smoker x40+ years who presents for follow-up.    Last seen due to difficulty in controlling hypertension and PAD.  Cilostazol was increased to 200 mg twice daily to help with walking distance.  Hydralazine was increased.  He states eating healthier, drinking a tea product which has helped his blood pressure improved.  States feeling tired/fatigue of late.  Irbesartan was prescribed but recalled by the company.  Currently takes Benicar 40 mg daily.  Otherwise he feels okay, his leg symptoms are better.    Prior notes  Renal ultrasound and renal angiogram were performed 06/2019 to evaluate for possible secondary causes but that was normal  Echo 08/2019 showed normal systolic function, EF 60 to 65%, impaired relaxation, mild LVH. PAD, evaluated by interventional cardiology, angio being deferred for now due to risk of CIN   Past Medical History:  Diagnosis Date  . Arthritis    hands  . Back pain    leg pain  . CAD (coronary artery disease)    a. 10/2019 Cor CTA: LM nl, LAD nl, LCX distal dzs w/ FFR 0.65-0.75. RCA nl-->Med Rx.  . CKD (chronic kidney disease), stage III ([place])   . Claudication ([place])   . Diabetes mellitus    type 2  . Diastolic dysfunction    a. 08/2019 Echo: EF 60-65%, mild LVH, impaired relaxation. Nl RV  size/fxn. Nl LA szie.  Marland Kitchen History of urinary retention   . Hyperlipidemia   . Hypertension    a. 05/2019 renal u/s concerning for R RAS; b. 06/2019 Renal artery angio: no more than15% bilat RAS.  Marland Kitchen Neuropathy    feet  . 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 Wears dentures    full upper    Past Surgical History:  Procedure Laterality Date  . BASAL CELL CARCINOMA EXCISION    . COLONOSCOPY WITH PROPOFOL N/A 12/25/2019   Procedure: COLONOSCOPY WITH PROPOFOL;  Surgeon: Lin Landsman, MD;  Location: [facility];  Service: Endoscopy;  Laterality: N/A;  Diabetic - injectable and oral meds  . LUMBAR LAMINECTOMY/DECOMPRESSION MICRODISCECTOMY  01/10/2012   Procedure: LUMBAR LAMINECTOMY/DECOMPRESSION MICRODISCECTOMY 2 LEVELS;  Surgeon: Hosie Spangle, MD;  Location: [place] [facility];  Service: Neurosurgery;  Laterality: Bilateral;  Lumbar four-Sacral One laminectomies  . LUNG SURGERY Right 11/22/12   right upper lobe  . POLYPECTOMY N/A 12/25/2019   Procedure: POLYPECTOMY;  Surgeon: Lin Landsman, MD;  Location: [place];  Service: Endoscopy;  Laterality: N/A;  . PROSTATE SURGERY     biopsy-due to elelvated PSA  . RENAL ANGIOGRAPHY Right 07/02/2019   Procedure: RENAL ANGIOGRAPHY;  Surgeon: Algernon Huxley, MD;  Location: [facility];  Service: Cardiovascular;  Laterality: Right;  .  SHOULDER SURGERY    . VASECTOMY      Current Medications: Current Meds  Medication Sig  . albuterol (VENTOLIN HFA) 108 (90 Base) MCG/ACT inhaler INHALE 1-2 PUFFS INTO THE LUNGS EVERY 4 (FOUR) HOURS AS NEEDED FOR WHEEZING OR SHORTNESS OF BREATH.  Marland Kitchen amLODipine (NORVASC) 10 MG tablet Take 1 tablet (10 mg total) by mouth daily.  Marland Kitchen aspirin EC 81 MG tablet Take 1 tablet (81 mg total) by mouth daily. Swallow whole.  Marland Kitchen atorvastatin (LIPITOR) 80 MG tablet Take 80 mg by mouth daily.   . cilostazol (PLETAL) 100 MG tablet Take 1 tablet (100 mg total) by mouth 2  (two) times daily.  . Dulaglutide 1.5 MG/0.5ML SOPN Inject 1.5 mg into the skin once a week.   Marland Kitchen FARXIGA 10 MG TABS tablet Take 10 mg by mouth every morning.  Marland Kitchen glucose blood (ONETOUCH VERIO) test strip USE ONCE DAILY. USE AS INSTRUCTED.  Marland Kitchen isosorbide mononitrate (IMDUR) 60 MG 24 hr tablet Take 1 tablet (60 mg total) by mouth daily.  Marland Kitchen olmesartan (BENICAR) 40 MG tablet Take 1 tablet by mouth daily.  . tamsulosin (FLOMAX) 0.4 MG CAPS capsule Take 1 capsule (0.4 mg total) by mouth daily after supper.  . [DISCONTINUED] hydrALAZINE (APRESOLINE) 100 MG tablet Take 1 tablet (100 mg total) by mouth 3 (three) times daily.     Allergies:   Codeine, Hydromorphone, and Morphine and related   Social History   Socioeconomic History  . Marital status: Married    Spouse name: Not on file  . Number of children: 4  . Years of education: Not on file  . Highest education level: Associate degree: occupational, Hotel manager, or vocational program  Occupational History  . Occupation: retired  Tobacco Use  . Smoking status: Former Smoker    Packs/day: 2.00    Years: 45.00    Pack years: 90.00    Types: Cigarettes    Quit date: 11/28/2012    Years since quitting: 7.9  . Smokeless tobacco: Current User    Types: Snuff  . Tobacco comment: Quit in 2014  Vaping Use  . Vaping Use: Never used  Substance and Sexual Activity  . Alcohol use: No  . Drug use: No  . Sexual activity: Yes    Birth control/protection: None  Other Topics Concern  . Not on file  Social History Narrative  . Not on file   Social Determinants of Health   Financial Resource Strain: Low Risk   . Difficulty of Paying Living Expenses: Not hard at all  Food Insecurity: No Food Insecurity  . Worried About Charity fundraiser in the Last Year: Never true  . Ran Out of Food in the Last Year: Never true  Transportation Needs: No Transportation Needs  . Lack of Transportation (Medical): No  . Lack of Transportation (Non-Medical): No   Physical Activity: Inactive  . Days of Exercise per Week: 0 days  . Minutes of Exercise per Session: 0 min  Stress: No Stress Concern Present  . Feeling of Stress : Not at all  Social Connections: Moderately Integrated  . Frequency of Communication with Friends and Family: More than three times a week  . Frequency of Social Gatherings with Friends and Family: More than three times a week  . Attends Religious Services: More than 4 times per year  . Active Member of Clubs or Organizations: No  . Attends Archivist Meetings: Never  . Marital Status: Married     Family  History: The patient's family history includes Cancer in his brother and father; Heart disease in his father; Stroke in his father.  ROS:   Please see the history of present illness.     All other systems reviewed and are negative.  EKGs/Labs/Other Studies Reviewed:    The following studies were reviewed today:   EKG:  EKG is ordered today.  EKG shows sinus rhythm, PVCs.  Recent Labs: 01/10/2020: Creatinine, Ser 2.30 06/09/2020: TSH 0.877  Recent Lipid Panel    Component Value Date/Time   CHOL 126 08/21/2020 1246   CHOL 221 (H) 10/19/2019 1046   TRIG 89 08/21/2020 1246   HDL 48 08/21/2020 1246   HDL 43 10/19/2019 1046   CHOLHDL 2.6 08/21/2020 1246   VLDL 18 08/21/2020 1246   LDLCALC 60 08/21/2020 1246   LDLCALC 150 (H) 10/19/2019 1046    Physical Exam:    VS:  BP (!) 110/30 (BP Location: Left Arm, Patient Position: Sitting, Cuff Size: Normal)   Pulse 100   Ht 5\' 10"  (1.778 m)   Wt 192 lb 2 oz (87.1 kg)   SpO2 98%   BMI 27.57 kg/m     Wt Readings from Last 3 Encounters:  10/23/20 192 lb 2 oz (87.1 kg)  10/20/20 196 lb (88.9 kg)  09/18/20 195 lb (88.5 kg)     GEN:  Well nourished, well developed in no acute distress HEENT: Normal NECK: No JVD; No carotid bruits LYMPHATICS: No lymphadenopathy CARDIAC: RRR, no murmurs, rubs, gallops RESPIRATORY:  Clear to auscultation without rales,  wheezing or rhonchi  ABDOMEN: Soft, non-tender, non-distended MUSCULOSKELETAL:  No edema; No deformity, extremities appear warm, DP difficult to palpate SKIN: Warm and dry NEUROLOGIC:  Alert and oriented x 3 PSYCHIATRIC:  Normal affect   ASSESSMENT:    1. Primary hypertension   2. Coronary artery disease involving native coronary artery of native heart without angina pectoris   3. Hyperlipidemia, unspecified hyperlipidemia type   4. Claudication in peripheral vascular disease ([place])    PLAN:    In order of problems listed above:  1. Hypertension, BP improved now with low diastolic.  Decrease hydralazine to 50 mg 3 times daily.  Continue amlodipine and Benicar for now.  Will consider decreasing hydralazine further if BP stays low.  Monitor for symptoms of fatigue improvement. 2. nonobstructive CAD.  Denies chest pain.  aspirin 81 mg daily, continue Lipitor 80, Imdur.   3. History of hyperlipidemia, last LDL at goal of 60, total cholesterol controlled.  Continue Lipitor as prescribed. 4. Peripheral arterial disease, mid SFA 75 to 99% stenosed, left mid SFA occluded.. Continue cilostazol to 100 mg twice daily.  Avoiding angiogram due to risk for CIN.  Continue aspirin, Lipitor.   Follow-up in 2 months.    Medication Adjustments/Labs and Tests Ordered: Current medicines are reviewed at length with the patient today.  Concerns regarding medicines are outlined above.  Orders Placed This Encounter  Procedures  . EKG 12-Lead   Meds ordered this encounter  Medications  . hydrALAZINE (APRESOLINE) 100 MG tablet    Sig: Take 0.5 tablets (50 mg total) by mouth 3 (three) times daily.    Dispense:  90 tablet    Refill:  3    Dose increase    Patient Instructions  Medication Instructions:  Your physician has recommended you make the following change in your medication:   1.  DECREASE your Hydralazine to 50 MG three times a day.  *If you need a refill  on your cardiac medications before  your next appointment, please call your pharmacy*   Lab Work: None Ordered If you have labs (blood work) drawn today and your tests are completely normal, you will receive your results only by: Marland Kitchen MyChart Message (if you have MyChart) OR . A paper copy in the mail If you have any lab test that is abnormal or we need to change your treatment, we will call you to review the results.   Testing/Procedures: None Ordered   Follow-Up: At [facility], you and your health needs are our priority.  As part of our continuing mission to provide you with exceptional heart care, we have created designated Provider Care Teams.  These Care Teams include your primary Cardiologist (physician) and Advanced Practice Providers (APPs -  Physician Assistants and Nurse Practitioners) who all work together to provide you with the care you need, when you need it.  We recommend signing up for the patient portal called "MyChart".  Sign up information is provided on this After Visit Summary.  MyChart is used to connect with patients for Virtual Visits (Telemedicine).  Patients are able to view lab/test results, encounter notes, upcoming appointments, etc.  Non-urgent messages can be sent to your provider as well.   To learn more about what you can do with MyChart, go to [email or web address].    Your next appointment:   1-2 months    The format for your next appointment:   In Person  Provider:   You may see Kate Sable, MD or one of the following Advanced Practice Providers on your designated Care Team:    Murray Hodgkins, NP  Christell Faith, PA-C  Marrianne Mood, PA-C  Cadence [place], [place]  Laurann Montana, NP    Other Instructions      Signed, Kate Sable, MD  10/23/2020 11:20 AM    [place]

## 2020-10-23 NOTE — Telephone Encounter (Signed)
LMOV to schedule 1-2 m fu per checkout 10/13/20 [facility]

## 2020-11-06 ENCOUNTER — Other Ambulatory Visit: Payer: Self-pay

## 2020-11-06 ENCOUNTER — Ambulatory Visit
Admission: RE | Admit: 2020-11-06 | Discharge: 2020-11-06 | Disposition: A | Discharge status: Home / Self Care | Payer: Medicare PPO | Source: Ambulatory Visit | Attending: Nephrology | Admitting: Nephrology

## 2020-11-06 DIAGNOSIS — N1832 Chronic kidney disease, stage 3b: Secondary | ICD-10-CM | POA: Diagnosis present

## 2020-11-06 DIAGNOSIS — E1122 Type 2 diabetes mellitus with diabetic chronic kidney disease: Secondary | ICD-10-CM | POA: Diagnosis not present

## 2020-12-01 ENCOUNTER — Ambulatory Visit: Payer: Medicare PPO | Admitting: Physician Assistant

## 2020-12-05 ENCOUNTER — Other Ambulatory Visit: Payer: Self-pay

## 2020-12-10 ENCOUNTER — Other Ambulatory Visit: Payer: Self-pay | Admitting: *Deleted

## 2020-12-10 DIAGNOSIS — R972 Elevated prostate specific antigen [PSA]: Secondary | ICD-10-CM

## 2020-12-11 ENCOUNTER — Other Ambulatory Visit: Payer: Self-pay

## 2020-12-23 ENCOUNTER — Encounter: Payer: Self-pay | Admitting: Cardiovascular Disease

## 2020-12-23 ENCOUNTER — Other Ambulatory Visit: Payer: Self-pay

## 2020-12-23 ENCOUNTER — Ambulatory Visit: Payer: Medicare PPO | Admitting: Cardiovascular Disease

## 2020-12-23 VITALS — BP 126/58 | HR 92 | Ht 70.0 in | Wt 203.0 lb

## 2020-12-23 DIAGNOSIS — I739 Peripheral vascular disease, unspecified: Secondary | ICD-10-CM | POA: Diagnosis not present

## 2020-12-23 DIAGNOSIS — I10 Essential (primary) hypertension: Secondary | ICD-10-CM | POA: Diagnosis not present

## 2020-12-23 DIAGNOSIS — I493 Ventricular premature depolarization: Secondary | ICD-10-CM | POA: Diagnosis not present

## 2020-12-23 DIAGNOSIS — E785 Hyperlipidemia, unspecified: Secondary | ICD-10-CM | POA: Diagnosis not present

## 2020-12-23 MED ORDER — HYDRALAZINE HCL 50 MG PO TABS: 50.0000 mg | ORAL_TABLET | Freq: Two times a day (BID) | ORAL | 3 refills | Status: DC

## 2020-12-23 NOTE — Progress Notes (Signed)
Cardiology Office Note   Date:  12/23/2020   ID:  Nicholas Nolan, DOB 12/30/1950, MRN [id number]  PCP:  Mar Daring, PA-C  Cardiologist: Dr. Garen Lah  Chief Complaint  Patient presents with  . Follow-up    1-2 months      History of Present Illness: Nicholas Nolan is a 70 y.o. male who is here today for follow-up visit regarding peripheral arterial disease.  He has known history of nonobstructive coronary artery disease on previous cardiac CTA with calcium score of 1689, peripheral arterial disease, essential hypertension, type 2 diabetes, hyperlipidemia and previous tobacco use.  He also has difficult to control hypertension and underlying chronic kidney disease.  Previous renal artery angiography and August 2020 showed less than 20% stenosis in bilateral renal arteries.  No significant iliac disease was reported. 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  The patient is followed for peripheral arterial disease with bilateral calf claudication. Noninvasive vascular studies were done last year and showed an ABI of 0.69 on the right and 0.60 on the left.  Duplex showed mild to moderate iliac disease, severe right mid SFA stenosis with peak velocity of 600.  On the left, the mid SFA was occluded with reconstitution above the knee.  He has underlying chronic kidney disease with creatinine around 2. He was started on cilostazol for claudication and reports some improvement in symptoms. Most of his symptoms started in the right hip and right calf area after walking about 100 yards. Symptoms are less severe on the left side. He denies chest pain but reports worsening palpitations since he has been on hydralazine.  Past Medical History:  Diagnosis Date  . Arthritis    hands  . Back pain    leg pain  . CAD (coronary artery disease)    a. 10/2019 Cor CTA: LM nl, LAD nl, LCX distal dzs w/ FFR 0.65-0.75. RCA  nl-->Med Rx.  . CKD (chronic kidney disease), stage III ([place])   . Claudication ([place])   . Diabetes mellitus    type 2  . Diastolic dysfunction    a. 08/2019 Echo: EF 60-65%, mild LVH, impaired relaxation. Nl RV size/fxn. Nl LA szie.  Marland Kitchen History of urinary retention   . Hyperlipidemia   . Hypertension    a. 05/2019 renal u/s concerning for R RAS; b. 06/2019 Renal artery angio: no more than15% bilat RAS.  Marland Kitchen Neuropathy    feet  . 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 Wears dentures    full upper    Past Surgical History:  Procedure Laterality Date  . BASAL CELL CARCINOMA EXCISION    . COLONOSCOPY WITH PROPOFOL N/A 12/25/2019   Procedure: COLONOSCOPY WITH PROPOFOL;  Surgeon: Lin Landsman, MD;  Location: [place];  Service: Endoscopy;  Laterality: N/A;  Diabetic - injectable and oral meds  . LUMBAR LAMINECTOMY/DECOMPRESSION MICRODISCECTOMY  01/10/2012   Procedure: LUMBAR LAMINECTOMY/DECOMPRESSION MICRODISCECTOMY 2 LEVELS;  Surgeon: Hosie Spangle, MD;  Location: [place] [facility];  Service: Neurosurgery;  Laterality: Bilateral;  Lumbar four-Sacral One laminectomies  . LUNG SURGERY Right 11/22/12   right upper lobe  . POLYPECTOMY N/A 12/25/2019   Procedure: POLYPECTOMY;  Surgeon: Lin Landsman, MD;  Location: [place];  Service: Endoscopy;  Laterality: N/A;  . PROSTATE SURGERY     biopsy-due to elelvated PSA  . RENAL ANGIOGRAPHY Right 07/02/2019  Procedure: RENAL ANGIOGRAPHY;  Surgeon: Algernon Huxley, MD;  Location: [place] [facility];  Service: Cardiovascular;  Laterality: Right;  . SHOULDER SURGERY    . VASECTOMY       Current Outpatient Medications  Medication Sig Dispense Refill  . albuterol (VENTOLIN HFA) 108 (90 Base) MCG/ACT inhaler INHALE 1-2 PUFFS INTO THE LUNGS EVERY 4 (FOUR) HOURS AS NEEDED FOR WHEEZING OR SHORTNESS OF BREATH. 8 g 0  . aspirin EC 81 MG tablet Take 1 tablet (81 mg total) by mouth daily. Swallow  whole. 90 tablet 3  . atorvastatin (LIPITOR) 80 MG tablet Take 80 mg by mouth daily.     . cilostazol (PLETAL) 100 MG tablet Take 1 tablet (100 mg total) by mouth 2 (two) times daily. 60 tablet 5  . Dulaglutide 1.5 MG/0.5ML SOPN Inject 1.5 mg into the skin once a week.     Marland Kitchen FARXIGA 10 MG TABS tablet Take 10 mg by mouth every morning.    Marland Kitchen glucose blood (ONETOUCH VERIO) test strip USE ONCE DAILY. USE AS INSTRUCTED.    . hydrALAZINE (APRESOLINE) 100 MG tablet Take 0.5 tablets (50 mg total) by mouth 3 (three) times daily. 90 tablet 3  . isosorbide mononitrate (IMDUR) 60 MG 24 hr tablet Take 1 tablet (60 mg total) by mouth daily. 90 tablet 3  . olmesartan (BENICAR) 40 MG tablet Take 1 tablet by mouth daily.    . tamsulosin (FLOMAX) 0.4 MG CAPS capsule Take 1 capsule (0.4 mg total) by mouth daily after supper. 90 capsule 1  . amLODipine (NORVASC) 10 MG tablet Take 1 tablet (10 mg total) by mouth daily. 90 tablet 1   No current facility-administered medications for this visit.    Allergies:   Codeine, Hydromorphone, and Morphine and related    Social History:  The patient  reports that he quit smoking about 8 years ago. His smoking use included cigarettes. He has a 90.00 pack-year smoking history. His smokeless tobacco use includes snuff. He reports that he does not drink alcohol and does not use drugs.   Family History:  The patient's family history includes Cancer in his brother and father; Heart disease in his father; Stroke in his father.    ROS:  Please see the history of present illness.   Otherwise, review of systems are positive for none.   All other systems are reviewed and negative.    PHYSICAL EXAM: VS:  BP (!) 126/58   Pulse 92   Ht 5\' 10"  (1.778 m)   Wt 203 lb (92.1 kg)   BMI 29.13 kg/m  , BMI Body mass index is 29.13 kg/m. GEN: Well nourished, well developed, in no acute distress  HEENT: normal  Neck: no JVD, carotid bruits, or masses Cardiac: RRR; no murmurs, rubs, or  gallops,no edema  Respiratory:  clear to auscultation bilaterally, normal work of breathing GI: soft, nontender, nondistended, + BS MS: no deformity or atrophy  Skin: warm and dry, no rash Neuro:  Strength and sensation are intact Psych: euthymic mood, full affect   EKG:  EKG is not ordered today.    Recent Labs: 01/10/2020: Creatinine, Ser 2.30 06/09/2020: TSH 0.877    Lipid Panel    Component Value Date/Time   CHOL 126 08/21/2020 1246   CHOL 221 (H) 10/19/2019 1046   TRIG 89 08/21/2020 1246   HDL 48 08/21/2020 1246   HDL 43 10/19/2019 1046   CHOLHDL 2.6 08/21/2020 1246   VLDL 18 08/21/2020 1246  LDLCALC 60 08/21/2020 1246   LDLCALC 150 (H) 10/19/2019 1046      Wt Readings from Last 3 Encounters:  12/23/20 203 lb (92.1 kg)  10/23/20 192 lb 2 oz (87.1 kg)  10/20/20 196 lb (88.9 kg)        PAD Screen 08/21/2020 07/24/2019  Previous PAD dx? No No  Previous surgical procedure? No No  Pain with walking? Yes No  Subsides with rest? Yes -  Feet/toe relief with dangling? Yes No  Painful, non-healing ulcers? Yes No  Extremities discolored? No No      ASSESSMENT AND PLAN:  1.  Peripheral arterial disease: Moderate bilateral calf claudication due to SFA disease. Symptoms are overall stable. He does not feel that his symptoms are currently lifestyle limiting and thus we will continue medical therapy especially in the setting of advanced chronic kidney disease.  2.  Asymptomatic PVCs: Stable.  3.  Hyperlipidemia: Continue treatment with a statin with a target LDL of less than 70. His LDL was previously 150 but improved with atorvastatin to 60.  4.  Essential hypertension: His blood pressure is controlled but he reports significant worsening of palpitations on high-dose hydralazine. I elected to decrease hydralazine to 50 mg twice daily. He was supposed to be taking 50 mg 3 times daily but it appears that he has been taking 100 mg 3 times daily.    Disposition:  Continue to follow-up with Dr. Garen Lah for CAD and hypertension. Follow-up with me on a yearly basis for peripheral arterial disease.  Signed,  Kathlyn Sacramento, MD  12/23/2020 3:40 PM    [facility]

## 2020-12-23 NOTE — Patient Instructions (Signed)
Medication Instructions:  Your physician has recommended you make the following change in your medication:   DECREASE Hydralazine twice daily. An Rx has been sent to your pharmacy.  *If you need a refill on your cardiac medications before your next appointment, please call your pharmacy*   Lab Work: None ordered If you have labs (blood work) drawn today and your tests are completely normal, you will receive your results only by: Marland Kitchen MyChart Message (if you have MyChart) OR . A paper copy in the mail If you have any lab test that is abnormal or we need to change your treatment, we will call you to review the results.   Testing/Procedures: None ordered   Follow-Up: At [facility], you and your health needs are our priority.  As part of our continuing mission to provide you with exceptional heart care, we have created designated Provider Care Teams.  These Care Teams include your primary Cardiologist (physician) and Advanced Practice Providers (APPs -  Physician Assistants and Nurse Practitioners) who all work together to provide you with the care you need, when you need it.  We recommend signing up for the patient portal called "MyChart".  Sign up information is provided on this After Visit Summary.  MyChart is used to connect with patients for Virtual Visits (Telemedicine).  Patients are able to view lab/test results, encounter notes, upcoming appointments, etc.  Non-urgent messages can be sent to your provider as well.   To learn more about what you can do with MyChart, go to [email or web address].    Your next appointment:   Your physician wants you to follow-up in: 1 year You will receive a reminder letter in the mail two months in advance. If you don't receive a letter, please call our office to schedule the follow-up appointment.   The format for your next appointment:   In Person  Provider:   You may see Kathlyn Sacramento, MD or one of the following Advanced Practice Providers  on your designated Care Team:    Murray Hodgkins, NP  Christell Faith, PA-C  Marrianne Mood, PA-C  Cadence [place], [place]  Laurann Montana, NP    Other Instructions N/A

## 2020-12-30 NOTE — Progress Notes (Signed)
Subjective:   Nicholas Nolan is a 70 y.o. male who presents for Medicare Annual/Subsequent preventive examination.  I connected with Nicholas Nolan today by telephone and verified that I am speaking with the correct person using two identifiers. Location patient: home Location provider: work Persons participating in the virtual visit: patient, provider.   I discussed the limitations, risks, security and privacy concerns of performing an evaluation and management service by telephone and the availability of in person appointments. I also discussed with the patient that there may be a patient responsible charge related to this service. The patient expressed understanding and verbally consented to this telephonic visit.    Interactive audio and video telecommunications were attempted between this provider and patient, however failed, due to patient having technical difficulties OR patient did not have access to video capability.  We continued and completed visit with audio only.   Review of Systems    N/A  Cardiac Risk Factors include: advanced age (>12men, >66 women);dyslipidemia;hypertension;male gender     Objective:    Today's Vitals   12/31/20 1529  PainSc: 2    There is no height or weight on file to calculate BMI.  Advanced Directives 12/31/2020 12/25/2019 12/06/2019 10/03/2019 10/02/2019 07/02/2019 12/04/2018  Does Patient Have a Medical Advance Directive? No No No No No No No  Would patient like information on creating a medical advance directive? No - Patient declined No - Patient declined No - Patient declined No - Patient declined No - Patient declined - No - Patient declined  Pre-existing out of facility DNR order (yellow form or pink MOST form) - - - - - - -    Current Medications (verified) Outpatient Encounter Medications as of 12/31/2020  Medication Sig  . amLODipine (NORVASC) 10 MG tablet Take 1 tablet (10 mg total) by mouth daily.  Marland Kitchen aspirin EC 81 MG tablet Take 1  tablet (81 mg total) by mouth daily. Swallow whole.  Marland Kitchen atorvastatin (LIPITOR) 80 MG tablet Take 80 mg by mouth daily.   . cilostazol (PLETAL) 100 MG tablet Take 1 tablet (100 mg total) by mouth 2 (two) times daily.  . Dulaglutide 1.5 MG/0.5ML SOPN Inject 1.5 mg into the skin once a week.   Marland Kitchen FARXIGA 10 MG TABS tablet Take 10 mg by mouth every morning.  Marland Kitchen glucose blood (ONETOUCH VERIO) test strip USE ONCE DAILY. USE AS INSTRUCTED.  . hydrALAZINE (APRESOLINE) 50 MG tablet Take 1 tablet (50 mg total) by mouth in the morning and at bedtime.  . isosorbide mononitrate (IMDUR) 60 MG 24 hr tablet Take 1 tablet (60 mg total) by mouth daily.  Marland Kitchen olmesartan (BENICAR) 40 MG tablet Take 1 tablet by mouth daily.  . tamsulosin (FLOMAX) 0.4 MG CAPS capsule Take 1 capsule (0.4 mg total) by mouth daily after supper.  Marland Kitchen albuterol (VENTOLIN HFA) 108 (90 Base) MCG/ACT inhaler INHALE 1-2 PUFFS INTO THE LUNGS EVERY 4 (FOUR) HOURS AS NEEDED FOR WHEEZING OR SHORTNESS OF BREATH. (Patient not taking: Reported on 12/31/2020)   No facility-administered encounter medications on file as of 12/31/2020.    Allergies (verified) Codeine, Hydromorphone, and Morphine and related   History: Past Medical History:  Diagnosis Date  . Arthritis    hands  . Back pain    leg pain  . CAD (coronary artery disease)    a. 10/2019 Cor CTA: LM nl, LAD nl, LCX distal dzs w/ FFR 0.65-0.75. RCA nl-->Med Rx.  . CKD (chronic kidney disease), stage III ([place])   .  Claudication ([place])   . Diabetes mellitus    type 2  . Diastolic dysfunction    a. 08/2019 Echo: EF 60-65%, mild LVH, impaired relaxation. Nl RV size/fxn. Nl LA szie.  Marland Kitchen History of urinary retention   . Hyperlipidemia   . Hypertension    a. 05/2019 renal u/s concerning for R RAS; b. 06/2019 Renal artery angio: no more than15% bilat RAS.  Marland Kitchen Neuropathy    feet  . 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 Wears dentures    full upper   Past  Surgical History:  Procedure Laterality Date  . BASAL CELL CARCINOMA EXCISION    . COLONOSCOPY WITH PROPOFOL N/A 12/25/2019   Procedure: COLONOSCOPY WITH PROPOFOL;  Surgeon: Lin Landsman, MD;  Location: [facility];  Service: Endoscopy;  Laterality: N/A;  Diabetic - injectable and oral meds  . LUMBAR LAMINECTOMY/DECOMPRESSION MICRODISCECTOMY  01/10/2012   Procedure: LUMBAR LAMINECTOMY/DECOMPRESSION MICRODISCECTOMY 2 LEVELS;  Surgeon: Hosie Spangle, MD;  Location: [facility];  Service: Neurosurgery;  Laterality: Bilateral;  Lumbar four-Sacral One laminectomies  . LUNG SURGERY Right 11/22/12   right upper lobe  . POLYPECTOMY N/A 12/25/2019   Procedure: POLYPECTOMY;  Surgeon: Lin Landsman, MD;  Location: [facility];  Service: Endoscopy;  Laterality: N/A;  . PROSTATE SURGERY     biopsy-due to elelvated PSA  . RENAL ANGIOGRAPHY Right 07/02/2019   Procedure: RENAL ANGIOGRAPHY;  Surgeon: Algernon Huxley, MD;  Location: [facility];  Service: Cardiovascular;  Laterality: Right;  . SHOULDER SURGERY    . VASECTOMY     Family History  Problem Relation Age of Onset  . Cancer Father        prostate  . Heart disease Father        MI  . Stroke Father   . Cancer Brother        liver   Social History   Socioeconomic History  . Marital status: Married    Spouse name: Not on file  . Number of children: 4  . Years of education: Not on file  . Highest education level: Associate degree: occupational, Hotel manager, or vocational program  Occupational History  . Occupation: retired  Tobacco Use  . Smoking status: Former Smoker    Packs/day: 2.00    Years: 45.00    Pack years: 90.00    Types: Cigarettes    Quit date: 11/28/2012    Years since quitting: 8.0  . Smokeless tobacco: Current User    Types: Snuff  . Tobacco comment: Quit in 2014  Vaping Use  . Vaping Use: Never used  Substance and Sexual Activity  . Alcohol use: No  . Drug use: No  . Sexual  activity: Yes    Birth control/protection: None  Other Topics Concern  . Not on file  Social History Narrative  . Not on file   Social Determinants of Health   Financial Resource Strain: Low Risk   . Difficulty of Paying Living Expenses: Not hard at all  Food Insecurity: No Food Insecurity  . Worried About Charity fundraiser in the Last Year: Never true  . Ran Out of Food in the Last Year: Never true  Transportation Needs: No Transportation Needs  . Lack of Transportation (Medical): No  . Lack of Transportation (Non-Medical): No  Physical Activity: Inactive  . Days of Exercise per Week: 0 days  . Minutes of Exercise per Session:  0 min  Stress: No Stress Concern Present  . Feeling of Stress : Not at all  Social Connections: Moderately Isolated  . Frequency of Communication with Friends and Family: More than three times a week  . Frequency of Social Gatherings with Friends and Family: More than three times a week  . Attends Religious Services: Never  . Active Member of Clubs or Organizations: No  . Attends Archivist Meetings: Never  . Marital Status: Married    Tobacco Counseling Ready to quit: Not Answered Counseling given: Not Answered Comment: Quit in 2014   Clinical Intake:  Pre-visit preparation completed: Yes  Pain : 0-10 Pain Score: 2  Pain Type: Chronic pain Pain Location: Back Pain Orientation: Lower Pain Descriptors / Indicators: Dull Pain Frequency: Intermittent Pain Relieving Factors: None.  Pain Relieving Factors: None.  Nutritional Risks: None Diabetes: Yes  How often do you need to have someone help you when you read instructions, pamphlets, or other written materials from your doctor or pharmacy?: 1 - Never  Diabetic? Yes  Nutrition Risk Assessment:  Has the patient had any N/V/D within the last 2 months?  No  Does the patient have any non-healing wounds?  No  Has the patient had any unintentional weight loss or weight gain?  No    Diabetes:  Is the patient diabetic?  Yes  If diabetic, was a CBG obtained today?  No  Did the patient bring in their glucometer from home?  No  How often do you monitor your CBG's? Once every couple week.   Financial Strains and Diabetes Management:  Are you having any financial strains with the device, your supplies or your medication? No .  Does the patient want to be seen by Chronic Care Management for management of their diabetes?  No  Would the patient like to be referred to a Nutritionist or for Diabetic Management?  No   Diabetic Exams:  Diabetic Eye Exam: Overdue for diabetic eye exam. Pt has been advised about the importance in completing this exam. Patient advised to call and schedule an eye exam. Diabetic Foot Exam: Done 10/06/20.   Interpreter Needed?: No  Information entered by :: [facility], LPN   Activities of Daily Living In your present state of health, do you have any difficulty performing the following activities: 12/31/2020  Hearing? Y  Comment Does not wear hearing aids.  Vision? N  Difficulty concentrating or making decisions? N  Walking or climbing stairs? N  Dressing or bathing? N  Doing errands, shopping? N  Preparing Food and eating ? N  Using the Toilet? N  In the past six months, have you accidently leaked urine? N  Do you have problems with loss of bowel control? N  Managing your Medications? N  Managing your Finances? N  Housekeeping or managing your Housekeeping? N  Some recent data might be hidden    Patient Care Team: Mar Daring, PA-C as PCP - General (Family Medicine) Kate Sable, MD as PCP - Cardiology (Cardiology) Anell Barr, OD (Optometry) Solum, Betsey Holiday, MD as Physician Assistant (Endocrinology) Abbie Sons, MD (Urology) Lyla Son, MD as Consulting Physician (Nephrology) Lin Landsman, MD as Consulting Physician (Gastroenterology)  Indicate any recent Medical Services you may have  received from other than [facility] providers in the past year (date may be approximate).     Assessment:   This is a routine wellness examination for Nicholas Nolan.  Hearing/Vision screen No exam data present  Dietary issues  and exercise activities discussed: Current Exercise Habits: The patient does not participate in regular exercise at present, Exercise limited by: None identified  Goals    . DIET - REDUCE SUGAR INTAKE     Recommend to continue current diet plan of cutting out all bad carbohydrates and sugar in diet to help aid in weight loss and diabetes control.    . Exercise 3x per week (30 min per time)     Recommend to exercise for 3 days a week for at least 30 minutes at a time.       Depression Screen PHQ 2/9 Scores 12/31/2020 12/06/2019 12/06/2019 12/04/2018 12/04/2018 11/15/2016  PHQ - 2 Score 0 0 0 0 0 0  PHQ- 9 Score - - - 0 - -    Fall Risk Fall Risk  12/31/2020 12/06/2019 12/04/2018 11/15/2016  Falls in the past year? 0 0 0 No  Number falls in past yr: 0 0 - -  Injury with Fall? 0 0 - -    FALL RISK PREVENTION PERTAINING TO THE HOME:  Any stairs in or around the home? Yes  If so, are there any without handrails? No  Home free of loose throw rugs in walkways, pet beds, electrical cords, etc? Yes  Adequate lighting in your home to reduce risk of falls? Yes   ASSISTIVE DEVICES UTILIZED TO PREVENT FALLS:  Life alert? No  Use of a cane, walker or w/c? No  Grab bars in the bathroom? No  Shower chair or bench in shower? No  Elevated toilet seat or a handicapped toilet? Yes   Cognitive Function: Normal cognitive status assessed by observation by this Nurse Health Advisor. No abnormalities found.       6CIT Screen 12/06/2019 12/04/2018  What Year? 0 points 0 points  What month? 0 points 0 points  What time? 0 points 0 points  Count back from 20 0 points 0 points  Months in reverse 0 points 0 points  Repeat phrase 0 points 0 points  Total Score 0 0     Immunizations Immunization History  Administered Date(s) Administered  . Td 06/07/2007  . Tdap 06/07/2007, 07/17/2017    TDAP status: Up to date  Flu Vaccine status: Declined, Education has been provided regarding the importance of this vaccine but patient still declined. Advised may receive this vaccine at local pharmacy or Health Dept. Aware to provide a copy of the vaccination record if obtained from local pharmacy or Health Dept. Verbalized acceptance and understanding.  Pneumococcal vaccine status: Due, Education has been provided regarding the importance of this vaccine. Advised may receive this vaccine at local pharmacy or Health Dept. Aware to provide a copy of the vaccination record if obtained from local pharmacy or Health Dept. Verbalized acceptance and understanding.  Covid-19 vaccine status: Declined, Education has been provided regarding the importance of this vaccine but patient still declined. Advised may receive this vaccine at local pharmacy or Health Dept.or vaccine clinic. Aware to provide a copy of the vaccination record if obtained from local pharmacy or Health Dept. Verbalized acceptance and understanding.  Qualifies for Shingles Vaccine? Yes   Zostavax completed No   Shingrix Completed?: No.    Education has been provided regarding the importance of this vaccine. Patient has been advised to call insurance company to determine out of pocket expense if they have not yet received this vaccine. Advised may also receive vaccine at local pharmacy or Health Dept. Verbalized acceptance and understanding.  Screening Tests Health Maintenance  Topic Date Due  . OPHTHALMOLOGY EXAM  Never done  . COLONOSCOPY (Pts 45-93yrs Insurance coverage will need to be confirmed)  12/24/2020  . COVID-19 Vaccine (1) 01/16/2021 (Originally 04/06/1963)  . INFLUENZA VACCINE  02/05/2021 (Originally 06/08/2020)  . Hepatitis C Screening  12/31/2021 (Originally 04-05-1951)  . PNA vac Low Risk  Adult (1 of 2 - PCV13) 12/31/2021 (Originally 04/05/2016)  . HEMOGLOBIN A1C  04/05/2021  . FOOT EXAM  10/06/2021  . TETANUS/TDAP  07/18/2027    Health Maintenance  Health Maintenance Due  Topic Date Due  . OPHTHALMOLOGY EXAM  Never done  . COLONOSCOPY (Pts 45-65yrs Insurance coverage will need to be confirmed)  12/24/2020    Colorectal cancer screening: Referral to GI placed today. Pt aware the office will call re: appt.  Lung Cancer Screening: (Low Dose CT Chest recommended if Age 81-80 years, 30 pack-year currently smoking OR have quit w/in 15years.) does qualify however declines at this time.   Additional Screening:  Hepatitis C Screening: does qualify however declines a future order.   Vision Screening: Recommended annual ophthalmology exams for early detection of glaucoma and other disorders of the eye. Is the patient up to date with their annual eye exam?  Yes  Who is the provider or what is the name of the office in which the patient attends annual eye exams? Dr Ellin Mayhew If pt is not established with a provider, would they like to be referred to a provider to establish care? No .   Dental Screening: Recommended annual dental exams for proper oral hygiene  Community Resource Referral / Chronic Care Management: CRR required this visit?  No   CCM required this visit?  No      Plan:     I have personally reviewed and noted the following in the patient's chart:   . Medical and social history . Use of alcohol, tobacco or illicit drugs  . Current medications and supplements . Functional ability and status . Nutritional status . Physical activity . Advanced directives . List of other physicians . Hospitalizations, surgeries, and ER visits in previous 12 months . Vitals . Screenings to include cognitive, depression, and falls . Referrals and appointments  In addition, I have reviewed and discussed with patient certain preventive protocols, quality metrics, and best  practice recommendations. A written personalized care plan for preventive services as well as general preventive health recommendations were provided to patient.     Nicholas Nolan, [place]   [id number]   Nurse Notes: Pt plans to call and schedule an eye exam this year. Pt declined a future Hep C lab order, Covid vaccine, flu vaccine or pneumonia vaccine.

## 2020-12-31 ENCOUNTER — Other Ambulatory Visit: Payer: Self-pay

## 2020-12-31 ENCOUNTER — Ambulatory Visit (INDEPENDENT_AMBULATORY_CARE_PROVIDER_SITE_OTHER): Payer: Medicare PPO

## 2020-12-31 DIAGNOSIS — Z0000 Encounter for general adult medical examination without abnormal findings: Secondary | ICD-10-CM | POA: Diagnosis not present

## 2020-12-31 DIAGNOSIS — Z1211 Encounter for screening for malignant neoplasm of colon: Secondary | ICD-10-CM | POA: Diagnosis not present

## 2020-12-31 NOTE — Patient Instructions (Signed)
[name] , Thank you for taking time to come for your Medicare Wellness Visit. I appreciate your ongoing commitment to your health goals. Please review the following plan we discussed and let me know if I can assist you in the future.   Screening recommendations/referrals: Colonoscopy: Currently due, referral placed today. Pt aware office with contact him to schedule apt. Recommended yearly ophthalmology/optometry visit for glaucoma screening and checkup Recommended yearly dental visit for hygiene and checkup  Vaccinations: Influenza vaccine: Currently due, declined receiving.  Pneumococcal vaccine: Currently due, declined receiving.  Tdap vaccine: Currently due, declined receiving.  Shingles vaccine: Shingrix discussed. Please contact your pharmacy for coverage information.     Advanced directives: Advance directive discussed with you today. Even though you declined this today please call our office should you change your mind and we can give you the proper paperwork for you to fill out.  Conditions/risks identified: Recommend to continue current diet plan of cutting out all bad carbohydrates and sugar in diet to help aid in weight loss and diabetes control.  Next appointment: 01/06/22 @ 2:40 PM for an AWV. Declined scheduling a follow with PCP at this time.   Preventive Care 70 Years and Older, Male Preventive care refers to lifestyle choices and visits with your health care provider that can promote health and wellness. What does preventive care include?  A yearly physical exam. This is also called an annual well check.  Dental exams once or twice a year.  Routine eye exams. Ask your health care provider how often you should have your eyes checked.  Personal lifestyle choices, including:  Daily care of your teeth and gums.  Regular physical activity.  Eating a healthy diet.  Avoiding tobacco and drug use.  Limiting alcohol use.  Practicing safe sex.  Taking low doses of  aspirin every day.  Taking vitamin and mineral supplements as recommended by your health care provider. What happens during an annual well check? The services and screenings done by your health care provider during your annual well check will depend on your age, overall health, lifestyle risk factors, and family history of disease. Counseling  Your health care provider may ask you questions about your:  Alcohol use.  Tobacco use.  Drug use.  Emotional well-being.  Home and relationship well-being.  Sexual activity.  Eating habits.  History of falls.  Memory and ability to understand (cognition).  Work and work Statistician. Screening  You may have the following tests or measurements:  Height, weight, and BMI.  Blood pressure.  Lipid and cholesterol levels. These may be checked every 5 years, or more frequently if you are over 70 years old.  Skin check.  Lung cancer screening. You may have this screening every year starting at age 70 if you have a 30-pack-year history of smoking and currently smoke or have quit within the past 15 years.  Fecal occult blood test (FOBT) of the stool. You may have this test every year starting at age 70.  Flexible sigmoidoscopy or colonoscopy. You may have a sigmoidoscopy every 5 years or a colonoscopy every 10 years starting at age 70.  Prostate cancer screening. Recommendations will vary depending on your family history and other risks.  Hepatitis C blood test.  Hepatitis B blood test.  Sexually transmitted disease (STD) testing.  Diabetes screening. This is done by checking your blood sugar (glucose) after you have not eaten for a while (fasting). You may have this done every 1-3 years.  Abdominal aortic  aneurysm (AAA) screening. You may need this if you are a current or former smoker.  Osteoporosis. You may be screened starting at age 70 if you are at high risk. Talk with your health care provider about your test results,  treatment options, and if necessary, the need for more tests. Vaccines  Your health care provider may recommend certain vaccines, such as:  Influenza vaccine. This is recommended every year.  Tetanus, diphtheria, and acellular pertussis (Tdap, Td) vaccine. You may need a Td booster every 10 years.  Zoster vaccine. You may need this after age 70.  Pneumococcal 13-valent conjugate (PCV13) vaccine. One dose is recommended after age 70.  Pneumococcal polysaccharide (PPSV23) vaccine. One dose is recommended after age 46. Talk to your health care provider about which screenings and vaccines you need and how often you need them. This information is not intended to replace advice given to you by your health care provider. Make sure you discuss any questions you have with your health care provider. Document Released: 11/21/2015 Document Revised: 07/14/2016 Document Reviewed: 08/26/2015 Elsevier Interactive Patient Education  2017 [employer] Prevention in the Home Falls can cause injuries. They can happen to people of all ages. There are many things you can do to make your home safe and to help prevent falls. What can I do on the outside of my home?  Regularly fix the edges of walkways and driveways and fix any cracks.  Remove anything that might make you trip as you walk through a door, such as a raised step or threshold.  Trim any bushes or trees on the path to your home.  Use bright outdoor lighting.  Clear any walking paths of anything that might make someone trip, such as rocks or tools.  Regularly check to see if handrails are loose or broken. Make sure that both sides of any steps have handrails.  Any raised decks and porches should have guardrails on the edges.  Have any leaves, snow, or ice cleared regularly.  Use sand or salt on walking paths during winter.  Clean up any spills in your garage right away. This includes oil or grease spills. What can I do in the  bathroom?  Use night lights.  Install grab bars by the toilet and in the tub and shower. Do not use towel bars as grab bars.  Use non-skid mats or decals in the tub or shower.  If you need to sit down in the shower, use a plastic, non-slip stool.  Keep the floor dry. Clean up any water that spills on the floor as soon as it happens.  Remove soap buildup in the tub or shower regularly.  Attach bath mats securely with double-sided non-slip rug tape.  Do not have throw rugs and other things on the floor that can make you trip. What can I do in the bedroom?  Use night lights.  Make sure that you have a light by your bed that is easy to reach.  Do not use any sheets or blankets that are too big for your bed. They should not hang down onto the floor.  Have a firm chair that has side arms. You can use this for support while you get dressed.  Do not have throw rugs and other things on the floor that can make you trip. What can I do in the kitchen?  Clean up any spills right away.  Avoid walking on wet floors.  Keep items that you use a lot  in easy-to-reach places.  If you need to reach something above you, use a strong step stool that has a grab bar.  Keep electrical cords out of the way.  Do not use floor polish or wax that makes floors slippery. If you must use wax, use non-skid floor wax.  Do not have throw rugs and other things on the floor that can make you trip. What can I do with my stairs?  Do not leave any items on the stairs.  Make sure that there are handrails on both sides of the stairs and use them. Fix handrails that are broken or loose. Make sure that handrails are as long as the stairways.  Check any carpeting to make sure that it is firmly attached to the stairs. Fix any carpet that is loose or worn.  Avoid having throw rugs at the top or bottom of the stairs. If you do have throw rugs, attach them to the floor with carpet tape.  Make sure that you have a  light switch at the top of the stairs and the bottom of the stairs. If you do not have them, ask someone to add them for you. What else can I do to help prevent falls?  Wear shoes that:  Do not have high heels.  Have rubber bottoms.  Are comfortable and fit you well.  Are closed at the toe. Do not wear sandals.  If you use a stepladder:  Make sure that it is fully opened. Do not climb a closed stepladder.  Make sure that both sides of the stepladder are locked into place.  Ask someone to hold it for you, if possible.  Clearly mark and make sure that you can see:  Any grab bars or handrails.  First and last steps.  Where the edge of each step is.  Use tools that help you move around (mobility aids) if they are needed. These include:  Canes.  Walkers.  Scooters.  Crutches.  Turn on the lights when you go into a dark area. Replace any light bulbs as soon as they burn out.  Set up your furniture so you have a clear path. Avoid moving your furniture around.  If any of your floors are uneven, fix them.  If there are any pets around you, be aware of where they are.  Review your medicines with your doctor. Some medicines can make you feel dizzy. This can increase your chance of falling. Ask your doctor what other things that you can do to help prevent falls. This information is not intended to replace advice given to you by your health care provider. Make sure you discuss any questions you have with your health care provider. Document Released: 08/21/2009 Document Revised: 04/01/2016 Document Reviewed: 11/29/2014 Elsevier Interactive Patient Education  2017 [employer].

## 2021-01-08 ENCOUNTER — Telehealth (INDEPENDENT_AMBULATORY_CARE_PROVIDER_SITE_OTHER): Payer: Self-pay | Admitting: Gastroenterology

## 2021-01-08 ENCOUNTER — Other Ambulatory Visit: Payer: Self-pay

## 2021-01-08 DIAGNOSIS — Z86010 Personal history of colonic polyps: Secondary | ICD-10-CM

## 2021-01-08 DIAGNOSIS — I700 Atherosclerosis of aorta: Secondary | ICD-10-CM | POA: Insufficient documentation

## 2021-01-08 DIAGNOSIS — I70219 Atherosclerosis of native arteries of extremities with intermittent claudication, unspecified extremity: Secondary | ICD-10-CM | POA: Insufficient documentation

## 2021-01-08 MED ORDER — NA SULFATE-K SULFATE-MG SULF 17.5-3.13-1.6 GM/177ML PO SOLN: 1.0000 | Freq: Once | ORAL | 0 refills | Status: AC

## 2021-01-08 NOTE — Progress Notes (Signed)
Gastroenterology Pre-Procedure Review  Request Date: Wed 01/21/21 Requesting Physician: Dr. Marius Ditch  PATIENT REVIEW QUESTIONS: The patient responded to the following health history questions as indicated:    1. Are you having any GI issues? no 2. Do you have a personal history of Polyps? yes (12/25/19 noted on colonoscopy report performed by Dr. Marius Ditch) 3. Do you have a family history of Colon Cancer or Polyps? no 4. Diabetes Mellitus? Type 2 controlled 5. Joint replacements in the past 12 months?no 6. Major health problems in the past 3 months?no 7. Any artificial heart valves, MVP, or defibrillator?no    MEDICATIONS & ALLERGIES:    Patient reports the following regarding taking any anticoagulation/antiplatelet therapy:   Plavix, Coumadin, Eliquis, Xarelto, Lovenox, Pradaxa, Brilinta, or Effient? no Aspirin? yes (81 mg daily)  Patient confirms/reports the following medications:  Current Outpatient Medications  Medication Sig Dispense Refill  . aspirin EC 81 MG tablet Take 1 tablet (81 mg total) by mouth daily. Swallow whole. 90 tablet 3  . atorvastatin (LIPITOR) 80 MG tablet Take 80 mg by mouth daily.     . cilostazol (PLETAL) 100 MG tablet Take 1 tablet (100 mg total) by mouth 2 (two) times daily. 60 tablet 5  . Dulaglutide 1.5 MG/0.5ML SOPN Inject 1.5 mg into the skin once a week.     Marland Kitchen FARXIGA 10 MG TABS tablet Take 10 mg by mouth every morning.    Marland Kitchen glucose blood (ONETOUCH VERIO) test strip USE ONCE DAILY. USE AS INSTRUCTED.    . hydrALAZINE (APRESOLINE) 50 MG tablet Take 1 tablet (50 mg total) by mouth in the morning and at bedtime. 180 tablet 3  . isosorbide mononitrate (IMDUR) 60 MG 24 hr tablet Take 1 tablet (60 mg total) by mouth daily. 90 tablet 3  . olmesartan (BENICAR) 40 MG tablet Take 1 tablet by mouth daily.    . tamsulosin (FLOMAX) 0.4 MG CAPS capsule Take 1 capsule (0.4 mg total) by mouth daily after supper. 90 capsule 1  . albuterol (VENTOLIN HFA) 108 (90 Base)  MCG/ACT inhaler INHALE 1-2 PUFFS INTO THE LUNGS EVERY 4 (FOUR) HOURS AS NEEDED FOR WHEEZING OR SHORTNESS OF BREATH. (Patient not taking: Reported on 01/08/2021) 8 g 0  . amLODipine (NORVASC) 10 MG tablet Take 1 tablet (10 mg total) by mouth daily. 90 tablet 1   No current facility-administered medications for this visit.    Patient confirms/reports the following allergies:  Allergies  Allergen Reactions  . Codeine Itching    And hyper also  . Hydromorphone Itching  . Morphine And Related Itching    No orders of the defined types were placed in this encounter.   AUTHORIZATION INFORMATION Primary Insurance: 1D#: Group #:  Secondary Insurance: 1D#: Group #:  SCHEDULE INFORMATION: Date: 01/21/21 Time: Location:[facility]

## 2021-01-12 ENCOUNTER — Telehealth: Payer: Self-pay | Admitting: Cardiology

## 2021-01-12 IMAGING — US ULTRASOUND RENAL ARTERY STENOSIS
1 series · 13 of 25 positions shown · non-contrast
Comparison: None.

CLINICAL DATA: 68-year-old male with resistant hypertension.
Evaluate for renal artery stenosis.

EXAM:
RENAL/URINARY TRACT ULTRASOUND
RENAL DUPLEX DOPPLER ULTRASOUND

[Series 1: ultrasound renal artery stenosis · 0.25mm/px · 13 of 58 slices shown]
[im 1/58]
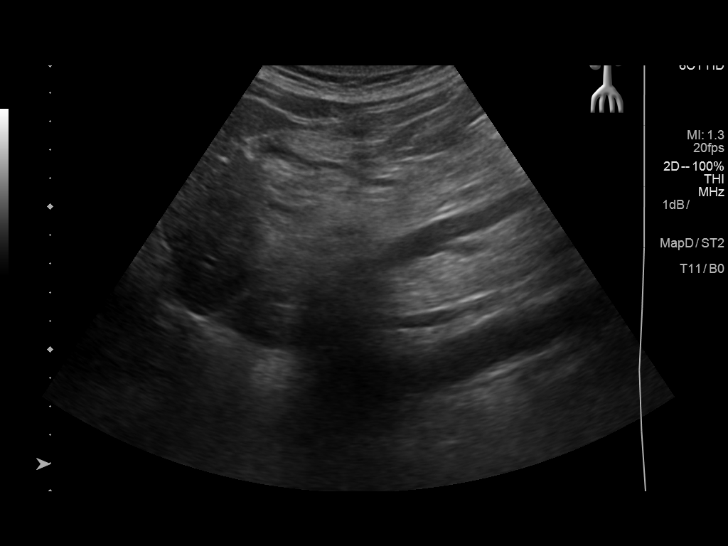
[im 5/58]
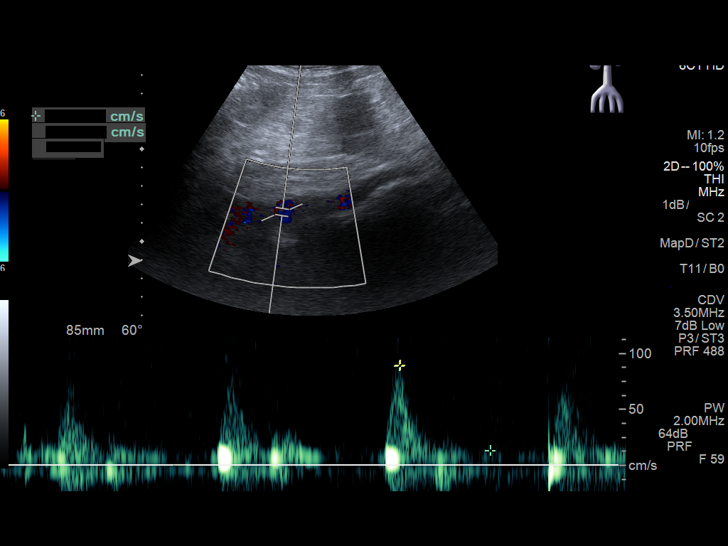
[im 10/58]
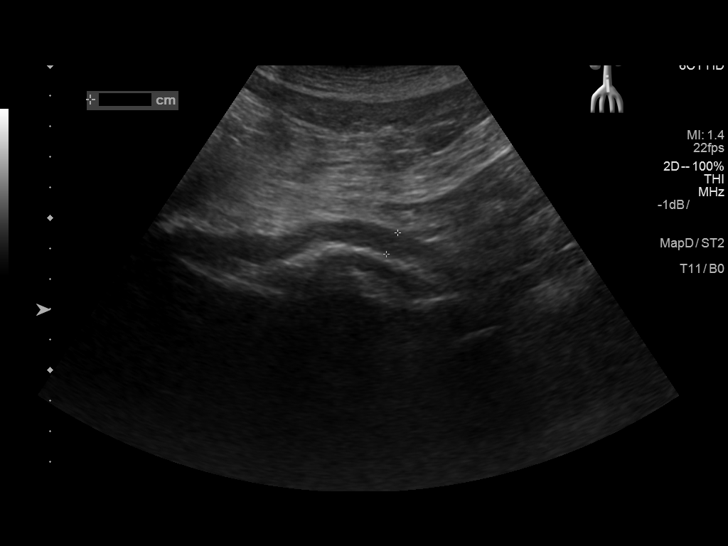
[im 15/58]
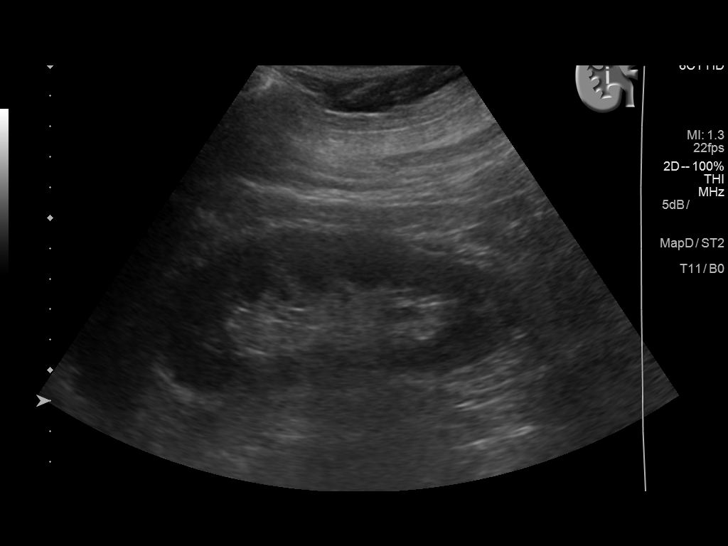
[im 20/58]
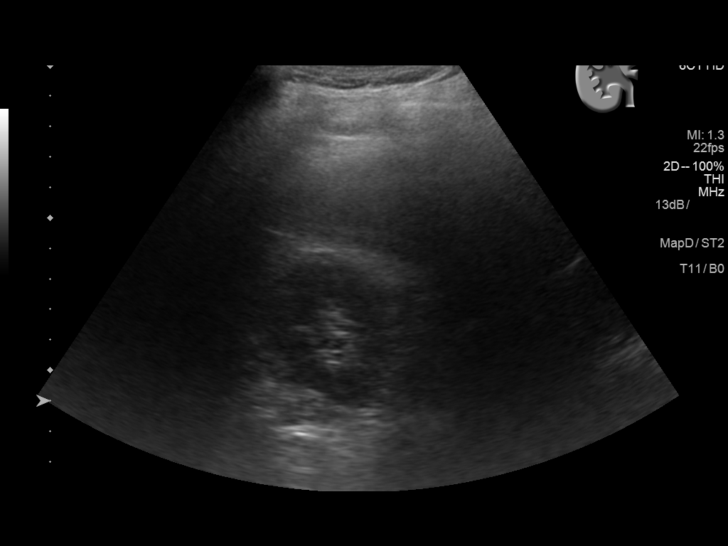
[im 24/58]
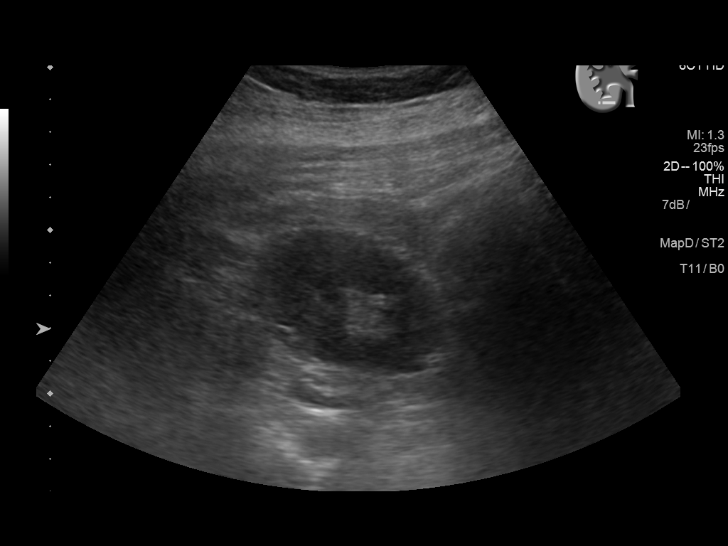
[im 29/58]
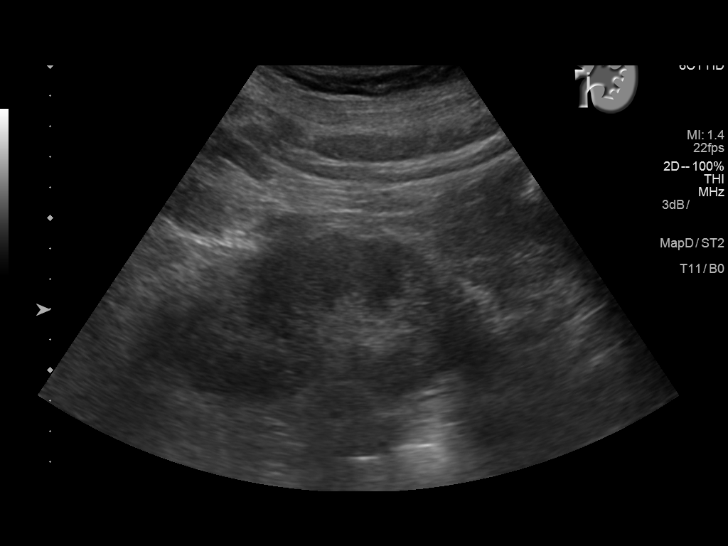
[im 34/58]
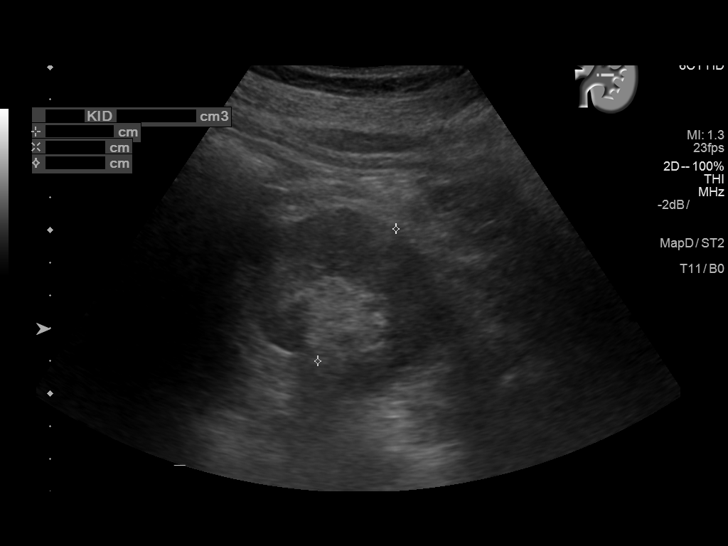
[im 39/58]
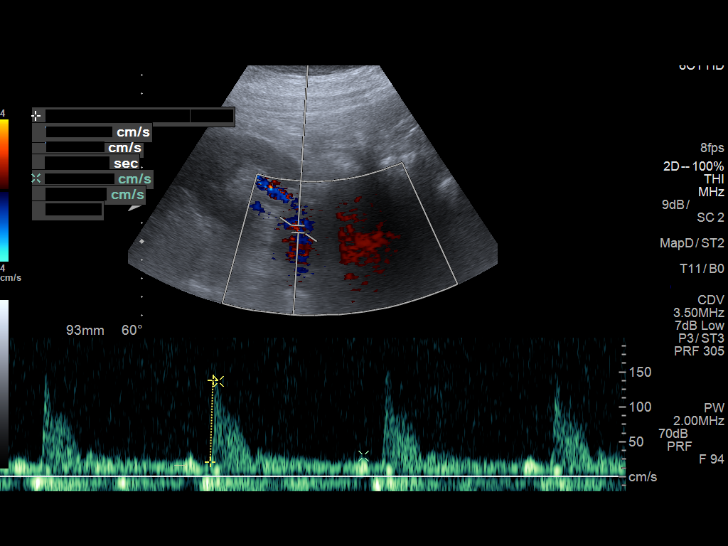
[im 43/58]
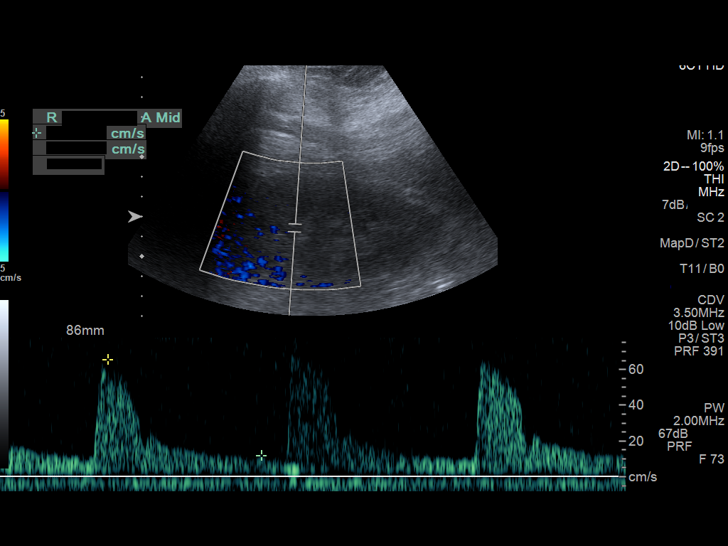
[im 48/58]
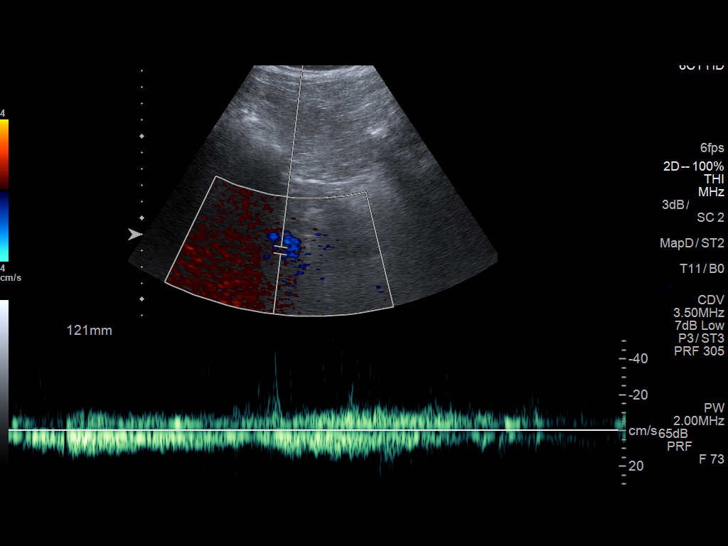
[im 53/58]
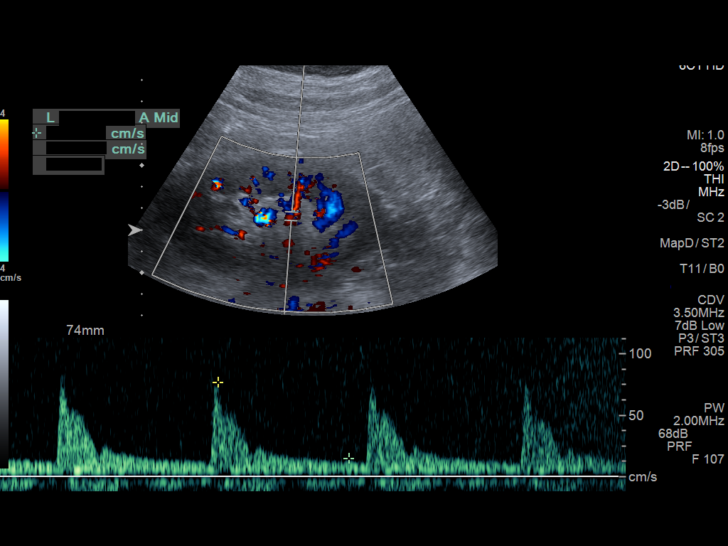
[im 58/58]
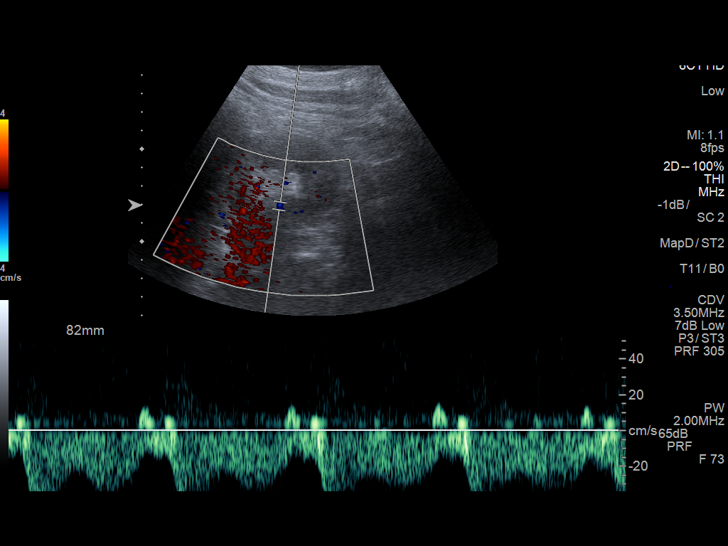

[13 of 25 positions shown; findings below may reference images not displayed]

FINDINGS: Right Kidney:

Length: 11.9 cm. Echogenicity within normal limits. No mass or
hydronephrosis visualized.

Left Kidney:

Length: 11.8 cm. Echogenicity within normal limits. No mass or
hydronephrosis visualized.

Bladder:  Within normal limits

RENAL DUPLEX ULTRASOUND

Right Renal Artery Velocities:

Origin:  139 cm/sec

Mid:  228 cm/sec

Hilum:  129 cm/sec

Interlobar:  66 cm/sec

Arcuate:  38 cm/sec

Left Renal Artery Velocities:

Origin:  163 cm/sec

Mid:  123 cm/sec

Hilum:  152 cm/sec

Interlobar:  77 cm/sec

Arcuate:  53 cm/sec

Aortic Velocity:  90 cm/sec

Right Renal-Aortic Ratios:

Origin:

Mid:

Hilum:

Interlobar:

Arcuate:

Left Renal-Aortic Ratios:

Origin:

Mid:

Hilum:

Interlobar:

Arcuate:

Renal veins: The bilateral renal veins are patent at the hila.
IMPRESSION: 1. Elevated peak systolic velocity in the mid segment of the right
renal artery concerning for a hemodynamically significant renal
artery stenosis. Consider referral to Interventional Radiology or
other endovascular specialist for further evaluation.
2. No evidence of significant left renal artery stenosis.
3. Sonographically unremarkable appearance of the kidneys.

## 2021-01-12 MED ORDER — CARVEDILOL 6.25 MG PO TABS: 6.2500 mg | ORAL_TABLET | Freq: Two times a day (BID) | ORAL | 3 refills | Status: DC

## 2021-01-12 NOTE — Telephone Encounter (Signed)
Pt c/o BP issue: STAT if pt c/o blurred vision, one-sided weakness or slurred speech  1. What are your last 5 BP readings?  This morning: 189/69 140/53 after medicine Now ~180/60  2. Are you having any other symptoms (ex. Dizziness, headache, blurred vision, passed out)? Dizziness, blurred vision   3. What is your BP issue? Patient calling, BP is high.  Scheduled to see Dr Garen Lah on 03/10.

## 2021-01-12 NOTE — Telephone Encounter (Signed)
I spoke with the patient and have advised him of Dr. Thereasa Solo recommendations to: - start coreg 6.25 mg BID  The patient has been made aware this should hopefully help with his BP and palpitations, but we will re-evaluate both of these in clinic on 01/15/21.  The patient voices understanding and is agreeable.

## 2021-01-12 NOTE — Telephone Encounter (Signed)
-----   Message from Kate Sable, MD sent at 01/12/2021 4:52 PM EST -----  Start Coreg 6.25 mg twice daily. Will titrate as needed for adequate blood pressure control and also control symptoms of tachycardia or palpitations.

## 2021-01-12 NOTE — Telephone Encounter (Signed)
I spoke with the patient regarding his BP readings. He states his readings have been up and down over the last few days. He will have SBP readings in the 180's, take his meds and his SBP will drop to 140. However, an hour later his SBP readings will be back up to the 180's.  He states his DBP is typically in the 60's. He complains of episodes of dizziness where he feels like he might fall over and intermittent blurred vision, which he thinks he is having when his BP readings are up.  His only other complaint is fatigue.   I have confirmed with the patient he is currently taking: - amlodipine 10 mg once daily (between 9am-10am) - hyralazine 50 mg BID (1st dose between 9am-10am) - imdur 60 mg once daily (between 9am-10am) - benicar 40 mg once daily (between 9am-10am)  Dr. Fletcher Anon dropped his hydralazine dose down to 50 mg BID on 12/23/20 as the patient's BP was reported as controlled and he felt his palpitations were worse on higher dose hydralazine.  Per the patient, his palpitations have not improved on lower dose hydralazine- "I can still her my heart beating."  He also reports about 2-3 weeks ago, after his appointment with Dr. Fletcher Anon, he was out walking and got extremely dizzy.  When he got home his wife checked his BP and his SBP was 80 at the time. He does not have any other readings of SBP down in the 80s that have been recorded.   I inquired if he was staying hydrated at that time and had advised "I thought I was."  He is currently scheduled to see Dr. Garen Lah on 01/15/21.  I have advised the patient I will forward this message to Dr. Garen Lah to please advise on any further recommendations prior to his in office visit.   The patient voices understanding and is agreeable.

## 2021-01-14 ENCOUNTER — Other Ambulatory Visit: Payer: Self-pay | Admitting: Physician Assistant

## 2021-01-15 ENCOUNTER — Ambulatory Visit (INDEPENDENT_AMBULATORY_CARE_PROVIDER_SITE_OTHER): Payer: Medicare PPO

## 2021-01-15 ENCOUNTER — Other Ambulatory Visit: Payer: Self-pay | Admitting: Cardiovascular Disease

## 2021-01-15 ENCOUNTER — Other Ambulatory Visit: Payer: Self-pay

## 2021-01-15 ENCOUNTER — Ambulatory Visit: Payer: Medicare PPO | Admitting: Cardiology

## 2021-01-15 ENCOUNTER — Encounter: Payer: Self-pay | Admitting: Cardiology

## 2021-01-15 VITALS — BP 142/50 | HR 72 | Ht 70.0 in | Wt 198.0 lb

## 2021-01-15 DIAGNOSIS — I10 Essential (primary) hypertension: Secondary | ICD-10-CM

## 2021-01-15 DIAGNOSIS — R0989 Other specified symptoms and signs involving the circulatory and respiratory systems: Secondary | ICD-10-CM | POA: Diagnosis not present

## 2021-01-15 DIAGNOSIS — E785 Hyperlipidemia, unspecified: Secondary | ICD-10-CM

## 2021-01-15 DIAGNOSIS — I739 Peripheral vascular disease, unspecified: Secondary | ICD-10-CM

## 2021-01-15 DIAGNOSIS — I2510 Atherosclerotic heart disease of native coronary artery without angina pectoris: Secondary | ICD-10-CM | POA: Diagnosis not present

## 2021-01-15 MED ORDER — HYDRALAZINE HCL 25 MG PO TABS: 25.0000 mg | ORAL_TABLET | Freq: Two times a day (BID) | ORAL | 3 refills | Status: DC

## 2021-01-15 NOTE — Telephone Encounter (Signed)
Prescription has never been prescribed by provider, it is a historical medication. Please advise if refill is appropriate, if agree please send to pharmacy on file. Thank you

## 2021-01-15 NOTE — Telephone Encounter (Signed)
Refilled patients Atorvastatin as requested.

## 2021-01-15 NOTE — Patient Instructions (Signed)
Medication Instructions:   Your physician has recommended you make the following change in your medication:   1.  DECREASE your Hydralazine to 25 MG twice a day.  *If you need a refill on your cardiac medications before your next appointment, please call your pharmacy*   Lab Work: None ordered If you have labs (blood work) drawn today and your tests are completely normal, you will receive your results only by: Marland Kitchen MyChart Message (if you have MyChart) OR . A paper copy in the mail If you have any lab test that is abnormal or we need to change your treatment, we will call you to review the results.   Testing/Procedures:  1.  Your physician has requested that you have a carotid duplex. This test is an ultrasound of the carotid arteries in your neck. It looks at blood flow through these arteries that supply the brain with blood. Allow one hour for this exam. There are no restrictions or special instructions.    Follow-Up: At [facility], you and your health needs are our priority.  As part of our continuing mission to provide you with exceptional heart care, we have created designated Provider Care Teams.  These Care Teams include your primary Cardiologist (physician) and Advanced Practice Providers (APPs -  Physician Assistants and Nurse Practitioners) who all work together to provide you with the care you need, when you need it.  We recommend signing up for the patient portal called "MyChart".  Sign up information is provided on this After Visit Summary.  MyChart is used to connect with patients for Virtual Visits (Telemedicine).  Patients are able to view lab/test results, encounter notes, upcoming appointments, etc.  Non-urgent messages can be sent to your provider as well.   To learn more about what you can do with MyChart, go to [email or web address].    Your next appointment:   1 month(s)  The format for your next appointment:   In Person  Provider:   Kate Sable,  MD   Other Instructions

## 2021-01-15 NOTE — Progress Notes (Signed)
Cardiology Office Note:    Date:  01/15/2021   ID:  Nicholas Nolan, DOB [birth year], MRN [id number]  PCP:  Mar Daring, PA-C  Cardiologist:  Kate Sable, MD  Electrophysiologist:  None   Referring MD: Florian Buff*   Chief Complaint  Patient presents with  . Other    Patient c.o BP being elevated, SOB, And dizziness. Meds reviewed verbally with patient.     History of Present Illness:    Nicholas Nolan is a 70 y.o. male with a hx of CAD (CCTA 10/2019-Ca score 1689, mid LAD, LCx disease, FFRct with no sig obstruction), PAD, CKD, hypertension, diabetes, hyperlipidemia, former smoker x40+ years who presents for follow-up.    Last seen due to difficulty in controlling hypertension and PAD.  Compliant with cilostazol to help with walking distance.  BP still elevated, started on Coreg for additional BP benefit.  States feeling dizzy at times, worst since starting hydralazine.  Dizziness is worse when standing from a seated position.  Blood pressures initially were in the 160s, have been in the 140s since starting Coreg.    Prior notes  Renal ultrasound and renal angiogram were performed 06/2019 to evaluate for possible secondary causes but that was normal  Echo 08/2019 showed normal systolic function, EF 60 to 65%, impaired relaxation, mild LVH. PAD, evaluated by interventional cardiology, angio being deferred for now due to risk of CIN   Past Medical History:  Diagnosis Date  . Arthritis    hands  . Back pain    leg pain  . CAD (coronary artery disease)    a. 10/2019 Cor CTA: LM nl, LAD nl, LCX distal dzs w/ FFR 0.65-0.75. RCA nl-->Med Rx.  . CKD (chronic kidney disease), stage III ([place])   . Claudication ([place])   . Diabetes mellitus    type 2  . Diastolic dysfunction    a. 08/2019 Echo: EF 60-65%, mild LVH, impaired relaxation. Nl RV size/fxn. Nl LA szie.  Marland Kitchen History of urinary retention   . Hyperlipidemia   . Hypertension    a. 05/2019 renal u/s  concerning for R RAS; b. 06/2019 Renal artery angio: no more than15% bilat RAS.  Marland Kitchen Neuropathy    feet  . 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 Wears dentures    full upper    Past Surgical History:  Procedure Laterality Date  . BASAL CELL CARCINOMA EXCISION    . COLONOSCOPY WITH PROPOFOL N/A 12/25/2019   Procedure: COLONOSCOPY WITH PROPOFOL;  Surgeon: Lin Landsman, MD;  Location: [place];  Service: Endoscopy;  Laterality: N/A;  Diabetic - injectable and oral meds  . LUMBAR LAMINECTOMY/DECOMPRESSION MICRODISCECTOMY  01/10/2012   Procedure: LUMBAR LAMINECTOMY/DECOMPRESSION MICRODISCECTOMY 2 LEVELS;  Surgeon: Hosie Spangle, MD;  Location: [place] [facility];  Service: Neurosurgery;  Laterality: Bilateral;  Lumbar four-Sacral One laminectomies  . LUNG SURGERY Right 11/22/12   right upper lobe  . POLYPECTOMY N/A 12/25/2019   Procedure: POLYPECTOMY;  Surgeon: Lin Landsman, MD;  Location: [place];  Service: Endoscopy;  Laterality: N/A;  . PROSTATE SURGERY     biopsy-due to elelvated PSA  . RENAL ANGIOGRAPHY Right 07/02/2019   Procedure: RENAL ANGIOGRAPHY;  Surgeon: Algernon Huxley, MD;  Location: [place] [facility];  Service: Cardiovascular;  Laterality: Right;  . SHOULDER SURGERY    . VASECTOMY      Current Medications: Current Meds  Medication Sig  .  albuterol (VENTOLIN HFA) 108 (90 Base) MCG/ACT inhaler INHALE 1-2 PUFFS INTO THE LUNGS EVERY 4 (FOUR) HOURS AS NEEDED FOR WHEEZING OR SHORTNESS OF BREATH.  Marland Kitchen amLODipine (NORVASC) 10 MG tablet Take 1 tablet (10 mg total) by mouth daily.  Marland Kitchen aspirin EC 81 MG tablet Take 1 tablet (81 mg total) by mouth daily. Swallow whole.  Marland Kitchen atorvastatin (LIPITOR) 80 MG tablet Take 80 mg by mouth daily.   . carvedilol (COREG) 6.25 MG tablet Take 1 tablet (6.25 mg total) by mouth 2 (two) times daily.  . cilostazol (PLETAL) 100 MG tablet Take 1 tablet (100 mg total) by mouth 2 (two) times daily.   . Dulaglutide 1.5 MG/0.5ML SOPN Inject 1.5 mg into the skin once a week.   Marland Kitchen FARXIGA 10 MG TABS tablet Take 10 mg by mouth every morning.  Marland Kitchen glucose blood (ONETOUCH VERIO) test strip USE ONCE DAILY. USE AS INSTRUCTED.  Marland Kitchen olmesartan (BENICAR) 40 MG tablet Take 1 tablet by mouth daily.  . tamsulosin (FLOMAX) 0.4 MG CAPS capsule Take 1 capsule (0.4 mg total) by mouth daily after supper.  . [DISCONTINUED] hydrALAZINE (APRESOLINE) 50 MG tablet Take 1 tablet (50 mg total) by mouth in the morning and at bedtime.     Allergies:   Codeine, Hydromorphone, and Morphine and related   Social History   Socioeconomic History  . Marital status: Married    Spouse name: Not on file  . Number of children: 4  . Years of education: Not on file  . Highest education level: Associate degree: occupational, Hotel manager, or vocational program  Occupational History  . Occupation: retired  Tobacco Use  . Smoking status: Former Smoker    Packs/day: 2.00    Years: 45.00    Pack years: 90.00    Types: Cigarettes    Quit date: 11/28/2012    Years since quitting: 8.1  . Smokeless tobacco: Current User    Types: Snuff  . Tobacco comment: Quit in 2014  Vaping Use  . Vaping Use: Never used  Substance and Sexual Activity  . Alcohol use: No  . Drug use: No  . Sexual activity: Yes    Birth control/protection: None  Other Topics Concern  . Not on file  Social History Narrative  . Not on file   Social Determinants of Health   Financial Resource Strain: Low Risk   . Difficulty of Paying Living Expenses: Not hard at all  Food Insecurity: No Food Insecurity  . Worried About Charity fundraiser in the Last Year: Never true  . Ran Out of Food in the Last Year: Never true  Transportation Needs: No Transportation Needs  . Lack of Transportation (Medical): No  . Lack of Transportation (Non-Medical): No  Physical Activity: Inactive  . Days of Exercise per Week: 0 days  . Minutes of Exercise per Session: 0 min   Stress: No Stress Concern Present  . Feeling of Stress : Not at all  Social Connections: Moderately Isolated  . Frequency of Communication with Friends and Family: More than three times a week  . Frequency of Social Gatherings with Friends and Family: More than three times a week  . Attends Religious Services: Never  . Active Member of Clubs or Organizations: No  . Attends Archivist Meetings: Never  . Marital Status: Married     Family History: The patient's family history includes Cancer in his brother and father; Heart disease in his father; Stroke in his father.  ROS:  Please see the history of present illness.     All other systems reviewed and are negative.  EKGs/Labs/Other Studies Reviewed:    The following studies were reviewed today:   EKG:  EKG is ordered today.  EKG shows sinus rhythm, PVCs.  Recent Labs: 06/09/2020: TSH 0.877  Recent Lipid Panel    Component Value Date/Time   CHOL 126 08/21/2020 1246   CHOL 221 (H) 10/19/2019 1046   TRIG 89 08/21/2020 1246   HDL 48 08/21/2020 1246   HDL 43 10/19/2019 1046   CHOLHDL 2.6 08/21/2020 1246   VLDL 18 08/21/2020 1246   LDLCALC 60 08/21/2020 1246   LDLCALC 150 (H) 10/19/2019 1046    Physical Exam:    VS:  BP (!) 142/50 (BP Location: Left Arm, Patient Position: Sitting, Cuff Size: Normal)   Pulse 72   Ht 5\' 10"  (1.778 m)   Wt 198 lb (89.8 kg)   SpO2 98%   BMI 28.41 kg/m     Wt Readings from Last 3 Encounters:  01/15/21 198 lb (89.8 kg)  12/23/20 203 lb (92.1 kg)  10/23/20 192 lb 2 oz (87.1 kg)     GEN:  Well nourished, well developed in no acute distress HEENT: Normal NECK: No JVD; bilateral carotid bruit noted worse on the right. LYMPHATICS: No lymphadenopathy CARDIAC: RRR, no murmurs, rubs, gallops RESPIRATORY:  Clear to auscultation without rales, wheezing or rhonchi  ABDOMEN: Soft, non-tender, non-distended MUSCULOSKELETAL:  No edema; No deformity, extremities appear warm, DP difficult  to palpate SKIN: Warm and dry NEUROLOGIC:  Alert and oriented x 3 PSYCHIATRIC:  Normal affect   ASSESSMENT:    1. Primary hypertension   2. Coronary artery disease involving native coronary artery of native heart without angina pectoris   3. Hyperlipidemia LDL goal <70   4. Bilateral carotid bruits   5. PVD (peripheral vascular disease) ([place])    PLAN:    In order of problems listed above:  1. Hypertension, BP improved now with low diastolic. decrease hydralazine to 25 mg twice daily.  Continue amlodipine, Benicar, Coreg.  Plan to increase Coreg as symptoms/BP permits.  Orthostatic vitals showed a drop from laying to sitting, 035 to 465 systolic.  Dizziness likely from patient accustomed to her blood pressures. 2. nonobstructive CAD.  Denies chest pain.  aspirin 81 mg daily, continue Lipitor 80, Imdur.   3. History of hyperlipidemia, LDL at goal, continue Lipitor. 4. Bilateral carotid bruits, obtain carotid ultrasound.  Continue aspirin, Lipitor.  If significant stenosis, refer to vascular surgery. 5. Peripheral arterial disease, mid SFA 75 to 99% stenosed, left mid SFA occluded.. Continue cilostazol, aspirin, Lipitor.  Keep appointment with peripheral clinic.  Follow-up in 1 month    Medication Adjustments/Labs and Tests Ordered: Current medicines are reviewed at length with the patient today.  Concerns regarding medicines are outlined above.  Orders Placed This Encounter  Procedures  . EKG 12-Lead  . VAS US CAROTID   Meds ordered this encounter  Medications  . hydrALAZINE (APRESOLINE) 25 MG tablet    Sig: Take 1 tablet (25 mg total) by mouth in the morning and at bedtime.    Dispense:  180 tablet    Refill:  3    Patient Instructions  Medication Instructions:   Your physician has recommended you make the following change in your medication:   1.  DECREASE your Hydralazine to 25 MG twice a day.  *If you need a refill on your cardiac medications before your next  appointment, please call your pharmacy*   Lab Work: None ordered If you have labs (blood work) drawn today and your tests are completely normal, you will receive your results only by: Marland Kitchen MyChart Message (if you have MyChart) OR . A paper copy in the mail If you have any lab test that is abnormal or we need to change your treatment, we will call you to review the results.   Testing/Procedures:  1.  Your physician has requested that you have a carotid duplex. This test is an ultrasound of the carotid arteries in your neck. It looks at blood flow through these arteries that supply the brain with blood. Allow one hour for this exam. There are no restrictions or special instructions.    Follow-Up: At [facility], you and your health needs are our priority.  As part of our continuing mission to provide you with exceptional heart care, we have created designated Provider Care Teams.  These Care Teams include your primary Cardiologist (physician) and Advanced Practice Providers (APPs -  Physician Assistants and Nurse Practitioners) who all work together to provide you with the care you need, when you need it.  We recommend signing up for the patient portal called "MyChart".  Sign up information is provided on this After Visit Summary.  MyChart is used to connect with patients for Virtual Visits (Telemedicine).  Patients are able to view lab/test results, encounter notes, upcoming appointments, etc.  Non-urgent messages can be sent to your provider as well.   To learn more about what you can do with MyChart, go to [email or web address].    Your next appointment:   1 month(s)  The format for your next appointment:   In Person  Provider:   Kate Sable, MD   Other Instructions      Signed, Kate Sable, MD  01/15/2021 12:39 PM    [place]

## 2021-01-16 ENCOUNTER — Telehealth: Payer: Self-pay

## 2021-01-16 DIAGNOSIS — I779 Disorder of arteries and arterioles, unspecified: Secondary | ICD-10-CM

## 2021-01-16 NOTE — Telephone Encounter (Signed)
-----   Message from Kate Sable, MD sent at 01/15/2021  5:42 PM EST ----- Bilateral carotid artery disease noted, bilateral subclavian artery stenosis, r please efer patient to vascular surgery for additional input.

## 2021-01-16 NOTE — Telephone Encounter (Signed)
Spoke with patient and informed him of the result note as charted below. Referral was put in for vascular surgery.  Patient stated that he took his BP before he took his Hydralazine this morning and it was 179/80. I instructed him to take his BP 1-2 hours after his medication over the weekend and to call us back on Monday with an update since we reduced his Hydralazine yesterday.  Patient verbalized understanding and agreed with plan.

## 2021-01-19 ENCOUNTER — Other Ambulatory Visit: Admission: RE | Admit: 2021-01-19 | Payer: Medicare PPO | Source: Ambulatory Visit

## 2021-01-20 ENCOUNTER — Telehealth: Payer: Self-pay

## 2021-01-20 NOTE — Telephone Encounter (Signed)
Patient called and said his blood pressure is running high and wants to cancel his procedure for 01/21/2021. He states that he does not want to rescheduled the procedure. He states he will call us when he decides to rescheduled

## 2021-01-21 ENCOUNTER — Encounter: Admission: RE | Payer: Self-pay | Source: Home / Self Care

## 2021-01-21 ENCOUNTER — Ambulatory Visit: Admission: RE | Admit: 2021-01-21 | Payer: Medicare PPO | Source: Home / Self Care | Admitting: Gastroenterology

## 2021-01-21 SURGERY — COLONOSCOPY WITH PROPOFOL
Anesthesia: General

## 2021-02-08 ENCOUNTER — Other Ambulatory Visit: Payer: Self-pay

## 2021-02-08 ENCOUNTER — Emergency Department
Admission: EM | Admit: 2021-02-08 | Discharge: 2021-02-08 | Disposition: A | Discharge status: Home / Self Care | Payer: Medicare PPO | Attending: Emergency Medicine | Admitting: Emergency Medicine

## 2021-02-08 DIAGNOSIS — R197 Diarrhea, unspecified: Secondary | ICD-10-CM | POA: Diagnosis not present

## 2021-02-08 DIAGNOSIS — R339 Retention of urine, unspecified: Secondary | ICD-10-CM

## 2021-02-08 DIAGNOSIS — Z8616 Personal history of COVID-19: Secondary | ICD-10-CM | POA: Insufficient documentation

## 2021-02-08 DIAGNOSIS — N1832 Chronic kidney disease, stage 3b: Secondary | ICD-10-CM | POA: Insufficient documentation

## 2021-02-08 DIAGNOSIS — Z85828 Personal history of other malignant neoplasm of skin: Secondary | ICD-10-CM | POA: Insufficient documentation

## 2021-02-08 DIAGNOSIS — R1030 Lower abdominal pain, unspecified: Secondary | ICD-10-CM | POA: Insufficient documentation

## 2021-02-08 DIAGNOSIS — E1122 Type 2 diabetes mellitus with diabetic chronic kidney disease: Secondary | ICD-10-CM | POA: Diagnosis not present

## 2021-02-08 DIAGNOSIS — Z7984 Long term (current) use of oral hypoglycemic drugs: Secondary | ICD-10-CM | POA: Diagnosis not present

## 2021-02-08 DIAGNOSIS — Z87891 Personal history of nicotine dependence: Secondary | ICD-10-CM | POA: Insufficient documentation

## 2021-02-08 DIAGNOSIS — Z85118 Personal history of other malignant neoplasm of bronchus and lung: Secondary | ICD-10-CM | POA: Diagnosis not present

## 2021-02-08 DIAGNOSIS — I129 Hypertensive chronic kidney disease with stage 1 through stage 4 chronic kidney disease, or unspecified chronic kidney disease: Secondary | ICD-10-CM | POA: Insufficient documentation

## 2021-02-08 DIAGNOSIS — Z7982 Long term (current) use of aspirin: Secondary | ICD-10-CM | POA: Insufficient documentation

## 2021-02-08 DIAGNOSIS — I2510 Atherosclerotic heart disease of native coronary artery without angina pectoris: Secondary | ICD-10-CM | POA: Diagnosis not present

## 2021-02-08 DIAGNOSIS — Z794 Long term (current) use of insulin: Secondary | ICD-10-CM | POA: Diagnosis not present

## 2021-02-08 DIAGNOSIS — Z79899 Other long term (current) drug therapy: Secondary | ICD-10-CM | POA: Diagnosis not present

## 2021-02-08 LAB — BASIC METABOLIC PANEL
Anion gap: 10 (ref 5–15)
BUN: 46 mg/dL — ABNORMAL HIGH (ref 8–23)
CO2: 21 mmol/L — ABNORMAL LOW (ref 22–32)
Calcium: 9.2 mg/dL (ref 8.9–10.3)
Chloride: 108 mmol/L (ref 98–111)
Creatinine, Ser: 1.99 mg/dL — ABNORMAL HIGH (ref 0.61–1.24)
GFR, Estimated: 36 mL/min — ABNORMAL LOW (ref >60)
Glucose, Bld: 131 mg/dL — ABNORMAL HIGH (ref 70–99)
Potassium: 4.7 mmol/L (ref 3.5–5.1)
Sodium: 139 mmol/L (ref 135–145)

## 2021-02-08 LAB — URINALYSIS, COMPLETE (UACMP) WITH MICROSCOPIC
Bacteria, UA: NONE SEEN
Bilirubin Urine: NEGATIVE
Glucose, UA: >=500 mg/dL — AB
Hgb urine dipstick: NEGATIVE
Ketones, ur: NEGATIVE mg/dL
Leukocytes,Ua: NEGATIVE
Nitrite: NEGATIVE
Protein, ur: 100 mg/dL — AB
Specific Gravity, Urine: 1.008 (ref 1.005–1.030)
Squamous Epithelial / HPF: NONE SEEN (ref 0–5)
pH: 5 (ref 5.0–8.0)

## 2021-02-08 NOTE — ED Provider Notes (Signed)
[facility] Emergency Department Provider Note  ____________________________________________   Event Date/Time   First MD Initiated Contact with Patient 02/08/21 1213     (approximate)  I have reviewed the triage vital signs and the nursing notes.   HISTORY  Chief Complaint Urinary Retention   HPI Nicholas Nolan is a 70 y.o. male with a past medical history of BPH currently on Flomax and urinary retention having required a Foley placement about a year ago as well as history of CKD, DM, CHF, HTN, HDL and arthritis who presents for assessment of decreased urine output and lower abdominal pain.  Patient states he last voided last night only a small amount but has been able to urinate today and has been developing progressive lower abdominal pain and tension since then.  He also notes a little bit of diarrhea today.  No blood in his stool.  Denies any headache Mary, sore throat, nausea, vomiting, chest pain, cough, shortness of breath, back pain, rash or extremity pain.  No recent medication changes or other clear precipitating events.  No other acute concerns at this time.         Past Medical History:  Diagnosis Date  . Arthritis    hands  . Back pain    leg pain  . CAD (coronary artery disease)    a. 10/2019 Cor CTA: LM nl, LAD nl, LCX distal dzs w/ FFR 0.65-0.75. RCA nl-->Med Rx.  . CKD (chronic kidney disease), stage III ([place])   . Claudication ([place])   . Diabetes mellitus    type 2  . Diastolic dysfunction    a. 08/2019 Echo: EF 60-65%, mild LVH, impaired relaxation. Nl RV size/fxn. Nl LA szie.  Marland Kitchen History of urinary retention   . Hyperlipidemia   . Hypertension    a. 05/2019 renal u/s concerning for R RAS; b. 06/2019 Renal artery angio: no more than15% bilat RAS.  Marland Kitchen Neuropathy    feet  . 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 Wears dentures    full upper    Patient Active Problem List   Diagnosis Date Noted   . Aortic atherosclerosis ([place]) 01/08/2021  . COVID-19 08/25/2020  . Anemia in chronic kidney disease 07/15/2020  . Benign hypertensive kidney disease with chronic kidney disease 07/15/2020  . OSA (obstructive sleep apnea) 05/30/2020  . Obesity (BMI 30.0-34.9) 05/30/2020  . Chronic kidney disease, stage 3b ([place]) 05/30/2020  . Acute hyperkalemia   . Anemia   . Essential hypertension   . Basal cell carcinoma of skin 05/07/2016  . Benign prostatic hyperplasia with lower urinary tract symptoms 05/07/2016  . Elevated PSA 05/07/2016  . ED (erectile dysfunction) of organic origin 05/07/2016  . Hyperthyroidism 05/07/2016  . LBP (low back pain) 05/07/2016  . Allergic rhinitis, seasonal 05/07/2016  . Compulsive tobacco user syndrome 05/07/2016  . Diabetes ([place]) 04/24/2015  . Resistant hypertension 04/24/2015  . Hyperlipemia 04/21/2015  . Controlled type 2 diabetes mellitus without complication, without long-term current use of insulin ([place]) 12/09/2014  . HLD (hyperlipidemia) 12/09/2014  . History of lung cancer 12/09/2014  . Lung cancer ([place]) 12/09/2014    Past Surgical History:  Procedure Laterality Date  . BASAL CELL CARCINOMA EXCISION    . COLONOSCOPY WITH PROPOFOL N/A 12/25/2019   Procedure: COLONOSCOPY WITH PROPOFOL;  Surgeon: Lin Landsman, MD;  Location: [place];  Service: Endoscopy;  Laterality: N/A;  Diabetic - injectable and oral  meds  . LUMBAR LAMINECTOMY/DECOMPRESSION MICRODISCECTOMY  01/10/2012   Procedure: LUMBAR LAMINECTOMY/DECOMPRESSION MICRODISCECTOMY 2 LEVELS;  Surgeon: Hosie Spangle, MD;  Location: [place] [facility];  Service: Neurosurgery;  Laterality: Bilateral;  Lumbar four-Sacral One laminectomies  . LUNG SURGERY Right 11/22/12   right upper lobe  . POLYPECTOMY N/A 12/25/2019   Procedure: POLYPECTOMY;  Surgeon: Lin Landsman, MD;  Location: [place];  Service: Endoscopy;  Laterality: N/A;  . PROSTATE SURGERY     biopsy-due to elelvated  PSA  . RENAL ANGIOGRAPHY Right 07/02/2019   Procedure: RENAL ANGIOGRAPHY;  Surgeon: Algernon Huxley, MD;  Location: [place] [facility];  Service: Cardiovascular;  Laterality: Right;  . SHOULDER SURGERY    . VASECTOMY      Prior to Admission medications   Medication Sig Start Date End Date Taking? Authorizing Provider  albuterol (VENTOLIN HFA) 108 (90 Base) MCG/ACT inhaler INHALE 1-2 PUFFS INTO THE LUNGS EVERY 4 (FOUR) HOURS AS NEEDED FOR WHEEZING OR SHORTNESS OF BREATH. 08/08/20   Mar Daring, PA-C  amLODipine (NORVASC) 10 MG tablet TAKE 1 TABLET BY MOUTH EVERY DAY 01/15/21   Kate Sable, MD  aspirin EC 81 MG tablet Take 1 tablet (81 mg total) by mouth daily. Swallow whole. 08/14/20   Kate Sable, MD  atorvastatin (LIPITOR) 80 MG tablet TAKE 1 TABLET BY MOUTH EVERY DAY 01/15/21   Kate Sable, MD  carvedilol (COREG) 6.25 MG tablet Take 1 tablet (6.25 mg total) by mouth 2 (two) times daily. 01/12/21   Kate Sable, MD  cilostazol (PLETAL) 100 MG tablet Take 1 tablet (100 mg total) by mouth 2 (two) times daily. 09/18/20   Kate Sable, MD  Dulaglutide 1.5 MG/0.5ML SOPN Inject 1.5 mg into the skin once a week.  12/17/15   [provider]  FARXIGA 10 MG TABS tablet Take 10 mg by mouth every morning. 08/31/19   [provider]  glucose blood (ONETOUCH VERIO) test strip USE ONCE DAILY. USE AS INSTRUCTED. 06/29/18   [provider]  hydrALAZINE (APRESOLINE) 25 MG tablet Take 1 tablet (25 mg total) by mouth in the morning and at bedtime. 01/15/21 04/15/21  Kate Sable, MD  olmesartan (BENICAR) 40 MG tablet Take 1 tablet by mouth daily. 07/04/19   [provider]  tamsulosin (FLOMAX) 0.4 MG CAPS capsule Take 1 capsule (0.4 mg total) by mouth daily after supper. 10/20/20   Stoioff, Ronda Fairly, MD    Allergies Codeine, Hydromorphone, and Morphine and related  Family History  Problem Relation Age of Onset  . Cancer Father         prostate  . Heart disease Father        MI  . Stroke Father   . Cancer Brother        liver    Social History Social History   Tobacco Use  . Smoking status: Former Smoker    Packs/day: 2.00    Years: 45.00    Pack years: 90.00    Types: Cigarettes    Quit date: 11/28/2012    Years since quitting: 8.2  . Smokeless tobacco: Current User    Types: Snuff  . Tobacco comment: Quit in 2014  Vaping Use  . Vaping Use: Never used  Substance Use Topics  . Alcohol use: No  . Drug use: No    Review of Systems  Review of Systems  Constitutional: Negative for chills and fever.  HENT: Negative for sore throat.   Eyes: Negative for pain.  Respiratory: Negative for cough and stridor.   Cardiovascular: Negative for chest pain.  Gastrointestinal: Negative for vomiting.  Skin: Negative for rash.  Neurological: Negative for seizures, loss of consciousness and headaches.  Psychiatric/Behavioral: Negative for suicidal ideas.  All other systems reviewed and are negative.     ____________________________________________   PHYSICAL EXAM:  VITAL SIGNS: ED Triage Vitals  Enc Vitals Group     BP 02/08/21 1146 (!) 169/51     Pulse Rate 02/08/21 1146 79     Resp 02/08/21 1146 20     Temp 02/08/21 1146 97.6 F (36.4 C)     Temp Source 02/08/21 1146 Oral     SpO2 02/08/21 1146 100 %     Weight 02/08/21 1143 150 lb (68 kg)     Height 02/08/21 1143 5\' 5"  (1.651 m)     Head Circumference --      Peak Flow --      Pain Score 02/08/21 1142 10     Pain Loc --      Pain Edu? --      Excl. in [place]? --    Vitals:   02/08/21 1230 02/08/21 1300  BP: (!) 167/67 (!) 144/66  Pulse: 85 77  Resp: 16 18  Temp:    SpO2: 98% 96%   Physical Exam Vitals and nursing note reviewed.  Constitutional:      Appearance: He is well-developed.  HENT:     Head: Normocephalic and atraumatic.     Right Ear: External ear normal.     Left Ear: External ear normal.     Nose: Nose normal.      Mouth/Throat:     Mouth: Mucous membranes are moist.  Eyes:     Conjunctiva/sclera: Conjunctivae normal.  Cardiovascular:     Rate and Rhythm: Normal rate and regular rhythm.     Heart sounds: No murmur heard.   Pulmonary:     Effort: Pulmonary effort is normal. No respiratory distress.     Breath sounds: Normal breath sounds.  Abdominal:     Palpations: Abdomen is soft.     Tenderness: There is abdominal tenderness in the suprapubic area. There is no right CVA tenderness or left CVA tenderness.  Musculoskeletal:     Cervical back: Neck supple.  Skin:    General: Skin is warm and dry.     Capillary Refill: Capillary refill takes less than 2 seconds.  Neurological:     Mental Status: He is alert and oriented to person, place, and time.  Psychiatric:        Mood and Affect: Mood normal.      ____________________________________________   LABS (all labs ordered are listed, but only abnormal results are displayed)  Labs Reviewed  URINALYSIS, COMPLETE (UACMP) WITH MICROSCOPIC - Abnormal; Notable for the following components:      Result Value   Color, Urine STRAW (*)    APPearance CLEAR (*)    Glucose, UA >=500 (*)    Protein, ur 100 (*)    All other components within normal limits  BASIC METABOLIC PANEL - Abnormal; Notable for the following components:   CO2 21 (*)    Glucose, Bld 131 (*)    BUN 46 (*)    Creatinine, Ser 1.99 (*)    GFR, Estimated 36 (*)    All other components within normal limits   ____________________________________________  EKG  ____________________________________________  RADIOLOGY  ED MD interpretation:  Official radiology report(s): No results found.  ____________________________________________   PROCEDURES  Procedure(s) performed (including Critical Care):  Procedures   ____________________________________________   INITIAL IMPRESSION / ASSESSMENT AND PLAN / ED COURSE     Presents with above-stated reexam for  assessment of decreased urine output and some suprapubic discomfort that began this morning with worsening over the course of the day prior to arrival.  On arrival patient is afebrile and hemodynamically stable.  Bladder scan in triage shows greater than 750 cc of urine.  On exam patient does have some fullness and mild tenderness in the suprapubic area but his abdomen is otherwise soft nontender throughout and he has no CVA tenderness.  Foley placed with brisk return of urine greater than 500 cc.  BMP obtained shows no evidence of significant kidney injury as creatinine of 1.99 as compared to 2.31-year ago.  No other significant electrolyte or metabolic derangements.  UA has no clear evidence of infection does have some glucose and protein.  Suspect likely tension related to patient's BPH although will maintain Foley and have patient see urology on outpatient basis.  Very low suspicion for the immediate life-threatening pathology and there is no other evidence on history or exam of significant acute infectious process, other significant metabolic derangement, or other acute process at this time.  Following placement of Foley patient stated he felt much better.  Patient discharged stable condition.  Strict return precautions advised and discussed.        ____________________________________________   FINAL CLINICAL IMPRESSION(S) / ED DIAGNOSES  Final diagnoses:  Urinary retention    Medications - No data to display   ED Discharge Orders    None       Note:  This document was prepared using Dragon voice recognition software and may include unintentional dictation errors.   Lucrezia Starch, MD 02/08/21 1320

## 2021-02-08 NOTE — ED Notes (Signed)
Pt c/o urinary retention and pelvic pain since this morning. The urine in proximal foley tubing appears light pink at this time. Pt given a couple of warm blankets.

## 2021-02-08 NOTE — ED Notes (Signed)
Bladder scan reads 954mL. Verbal orders to place foley from Dr. Tamala Julian

## 2021-02-08 NOTE — ED Triage Notes (Signed)
Pt via POV from home. Pt c/o urinary rentention, pt states this has happened before. Last time pt voided was last night. Pt is A&Ox4 and NAD.

## 2021-02-08 NOTE — ED Notes (Signed)
Foley bag changed to leg bag with extension tubing and secured to calf. Pt given additional leg bag and straps. Pt verbalizes understanding of foley home care.

## 2021-02-09 ENCOUNTER — Ambulatory Visit (INDEPENDENT_AMBULATORY_CARE_PROVIDER_SITE_OTHER): Payer: Medicare PPO | Admitting: Vascular Surgery

## 2021-02-09 ENCOUNTER — Encounter (INDEPENDENT_AMBULATORY_CARE_PROVIDER_SITE_OTHER): Payer: Self-pay | Admitting: Vascular Surgery

## 2021-02-09 VITALS — BP 152/63 | HR 76 | Resp 16 | Ht 70.0 in | Wt 201.0 lb

## 2021-02-09 DIAGNOSIS — I6523 Occlusion and stenosis of bilateral carotid arteries: Secondary | ICD-10-CM | POA: Diagnosis not present

## 2021-02-09 DIAGNOSIS — I70213 Atherosclerosis of native arteries of extremities with intermittent claudication, bilateral legs: Secondary | ICD-10-CM

## 2021-02-09 DIAGNOSIS — E119 Type 2 diabetes mellitus without complications: Secondary | ICD-10-CM | POA: Diagnosis not present

## 2021-02-09 DIAGNOSIS — I10 Essential (primary) hypertension: Secondary | ICD-10-CM

## 2021-02-09 DIAGNOSIS — E782 Mixed hyperlipidemia: Secondary | ICD-10-CM

## 2021-02-09 DIAGNOSIS — I6529 Occlusion and stenosis of unspecified carotid artery: Secondary | ICD-10-CM | POA: Insufficient documentation

## 2021-02-09 NOTE — Progress Notes (Signed)
MRN : [id number]  Nicholas Nolan is a 70 y.o. (Aug 12, 1951) male who presents with chief complaint of No chief complaint on file. Marland Kitchen  History of Present Illness:   The patient is seen for evaluation of carotid stenosis. The carotid stenosis was identified after a bruit was auscultated.  The patient denies amaurosis fugax. There is no recent history of TIA symptoms or focal motor deficits. There is no prior documented CVA.  There is no history of migraine headaches. There is no history of seizures.  The patient is taking enteric-coated aspirin 81 mg daily.  The patient has a history of coronary artery disease, no recent episodes of angina or shortness of breath. The patient denies PAD or claudication symptoms. There is a history of hyperlipidemia which is being treated with a statin.   Duplex ultrasound dated January 15, 2021 demonstrates 40 to 59% stenosis bilateral internal carotid arteries  No outpatient medications have been marked as taking for the 02/09/21 encounter (Office Visit) with Delana Meyer, Dolores Lory, MD.    Past Medical History:  Diagnosis Date  . Arthritis    hands  . Back pain    leg pain  . CAD (coronary artery disease)    a. 10/2019 Cor CTA: LM nl, LAD nl, LCX distal dzs w/ FFR 0.65-0.75. RCA nl-->Med Rx.  . CKD (chronic kidney disease), stage III ([place])   . Claudication ([place])   . Diabetes mellitus    type 2  . Diastolic dysfunction    a. 08/2019 Echo: EF 60-65%, mild LVH, impaired relaxation. Nl RV size/fxn. Nl LA szie.  Marland Kitchen History of urinary retention   . Hyperlipidemia   . Hypertension    a. 05/2019 renal u/s concerning for R RAS; b. 06/2019 Renal artery angio: no more than15% bilat RAS.  Marland Kitchen Neuropathy    feet  . Primary cancer of right upper lobe of lung ([place]) 2014   RUL Lobectomy  . Wears dentures    full upper    Past Surgical History:  Procedure Laterality Date  . BASAL CELL CARCINOMA EXCISION    . COLONOSCOPY WITH PROPOFOL N/A 12/25/2019    Procedure: COLONOSCOPY WITH PROPOFOL;  Surgeon: Lin Landsman, MD;  Location: [facility];  Service: Endoscopy;  Laterality: N/A;  Diabetic - injectable and oral meds  . LUMBAR LAMINECTOMY/DECOMPRESSION MICRODISCECTOMY  01/10/2012   Procedure: LUMBAR LAMINECTOMY/DECOMPRESSION MICRODISCECTOMY 2 LEVELS;  Surgeon: Hosie Spangle, MD;  Location: [facility];  Service: Neurosurgery;  Laterality: Bilateral;  Lumbar four-Sacral One laminectomies  . LUNG SURGERY Right 11/22/12   right upper lobe  . POLYPECTOMY N/A 12/25/2019   Procedure: POLYPECTOMY;  Surgeon: Lin Landsman, MD;  Location: [facility];  Service: Endoscopy;  Laterality: N/A;  . PROSTATE SURGERY     biopsy-due to elelvated PSA  . RENAL ANGIOGRAPHY Right 07/02/2019   Procedure: RENAL ANGIOGRAPHY;  Surgeon: Algernon Huxley, MD;  Location: [facility];  Service: Cardiovascular;  Laterality: Right;  . SHOULDER SURGERY    . VASECTOMY      Social History Social History   Tobacco Use  . Smoking status: Former Smoker    Packs/day: 2.00    Years: 45.00    Pack years: 90.00    Types: Cigarettes    Quit date: 11/28/2012    Years since quitting: 8.2  . Smokeless tobacco: Current User    Types: Snuff  . Tobacco comment: Quit in 2014  Vaping Use  . Vaping Use: Never  used  Substance Use Topics  . Alcohol use: No  . Drug use: No    Family History Family History  Problem Relation Age of Onset  . Cancer Father        prostate  . Heart disease Father        MI  . Stroke Father   . Cancer Brother        liver  No family history of bleeding/clotting disorders, porphyria or autoimmune disease   Allergies  Allergen Reactions  . Codeine Itching    And hyper also  . Hydromorphone Itching  . Morphine And Related Itching     REVIEW OF SYSTEMS (Negative unless checked)  Constitutional: [] Weight loss  [] Fever  [] Chills Cardiac: [] Chest pain   [] Chest pressure   [] Palpitations   [] Shortness of  breath when laying flat   [] Shortness of breath with exertion. Vascular:  [x] Pain in legs with walking   [] Pain in legs at rest  [] History of DVT   [] Phlebitis   [] Swelling in legs   [] Varicose veins   [] Non-healing ulcers Pulmonary:   [] Uses home oxygen   [] Productive cough   [] Hemoptysis   [] Wheeze  [] COPD   [] Asthma Neurologic:  [] Dizziness   [] Seizures   [] History of stroke   [] History of TIA  [] Aphasia   [] Vissual changes   [] Weakness or numbness in arm   [] Weakness or numbness in leg Musculoskeletal:   [] Joint swelling   [x] Joint pain   [] Low back pain Hematologic:  [] Easy bruising  [] Easy bleeding   [] Hypercoagulable state   [] Anemic Gastrointestinal:  [] Diarrhea   [] Vomiting  [] Gastroesophageal reflux/heartburn   [] Difficulty swallowing. Genitourinary:  [] Chronic kidney disease   [] Difficult urination  [] Frequent urination   [] Blood in urine Skin:  [] Rashes   [] Ulcers  Psychological:  [] History of anxiety   []  History of major depression.  Physical Examination  There were no vitals filed for this visit. There is no height or weight on file to calculate BMI. Gen: WD/WN, NAD Head: [place]/AT, No temporalis wasting.  Ear/Nose/Throat: Hearing grossly intact, nares w/o erythema or drainage, poor dentition Eyes: PER, EOMI, sclera nonicteric.  Neck: Supple, no masses.  No bruit or JVD.  Pulmonary:  Good air movement, clear to auscultation bilaterally, no use of accessory muscles.  Cardiac: RRR, normal S1, S2, no Murmurs. Vascular:  Vessel Right Left  Radial Palpable Palpable  PT Not Palpable Not Palpable  DP Not Palpable Not Palpable  Gastrointestinal: soft, non-distended. No guarding/no peritoneal signs.  Musculoskeletal: M/S 5/5 throughout.  No deformity or atrophy.  Neurologic: CN 2-12 intact. Pain and light touch intact in extremities.  Symmetrical.  Speech is fluent. Motor exam as listed above. Psychiatric: Judgment intact, Mood & affect appropriate for pt's clinical  situation. Dermatologic: No rashes or ulcers noted.  No changes consistent with cellulitis. Lymph : No Cervical lymphadenopathy, no lichenification or skin changes of chronic lymphedema.  CBC Lab Results  Component Value Date   WBC 9.3 10/03/2019   HGB 10.5 (L) 10/03/2019   HCT 31.7 (L) 10/03/2019   MCV 89.8 10/03/2019   PLT 231 10/03/2019    BMET    Component Value Date/Time   NA 139 02/08/2021 1221   NA 140 10/19/2019 1046   K 4.7 02/08/2021 1221   CL 108 02/08/2021 1221   CO2 21 (L) 02/08/2021 1221   GLUCOSE 131 (H) 02/08/2021 1221   BUN 46 (H) 02/08/2021 1221   BUN 30 (H) 10/19/2019 1046   CREATININE 1.99 (  H) 02/08/2021 1221   CALCIUM 9.2 02/08/2021 1221   GFRNONAA 36 (L) 02/08/2021 1221   GFRAA 51 (L) 10/19/2019 1046   Estimated Creatinine Clearance: 30.5 mL/min (A) (by C-G formula based on SCr of 1.99 mg/dL (H)).  COAG No results found for: INR, PROTIME  Radiology VAS US CAROTID  Result Date: 01/15/2021 Carotid Arterial Duplex Study Indications:       Bilateral bruits. Risk Factors:      Hypertension, hyperlipidemia, Diabetes, past history of                    smoking, coronary artery disease, PAD. Comparison Study:  None Performing Technologist: Pilar Jarvis RDMS, RVT, RDCS  Examination Guidelines: A complete evaluation includes B-mode imaging, spectral Doppler, color Doppler, and power Doppler as needed of all accessible portions of each vessel. Bilateral testing is considered an integral part of a complete examination. Limited examinations for reoccurring indications may be performed as noted.  Right Carotid Findings: +----------+--------+--------+--------+-----------------------+--------+           PSV cm/sEDV cm/sStenosisPlaque Description     Comments +----------+--------+--------+--------+-----------------------+--------+ CCA Prox  188     48                                               +----------+--------+--------+--------+-----------------------+--------+ CCA Distal108     21              calcific                        +----------+--------+--------+--------+-----------------------+--------+ ICA Prox  167     45      40-59%  heterogenous and smooth         +----------+--------+--------+--------+-----------------------+--------+ ICA Mid   141     25              smooth and heterogenous         +----------+--------+--------+--------+-----------------------+--------+ ICA Distal104     27                                              +----------+--------+--------+--------+-----------------------+--------+ ECA       176     22              hyperechoic and focal           +----------+--------+--------+--------+-----------------------+--------+ +----------+--------+-------+--------+-------------------+           PSV cm/sEDV cmsDescribeArm Pressure (mmHG) +----------+--------+-------+--------+-------------------+ [id number]            Stenotic142                 +----------+--------+-------+--------+-------------------+ +---------+--------+--+--------+--+---------+ VertebralPSV cm/s74EDV cm/s16Antegrade +---------+--------+--+--------+--+---------+ 2 heterogeneous areas in the thyroid that demonstrate peripheral vascularity. Areas measure 08 x 0.4 cm and 1.0 x 0.7 cm Left Carotid Findings: +----------+-------+-------+--------+---------------------------------+--------+           PSV    EDV    StenosisPlaque Description               Comments           cm/s   cm/s                                                     +----------+-------+-------+--------+---------------------------------+--------+  CCA Prox  144    26                                                       +----------+-------+-------+--------+---------------------------------+--------+ CCA Distal117    27     <50%    smooth and hypoechoic                      +----------+-------+-------+--------+---------------------------------+--------+ ICA Prox  167    45     40-59%  smooth, heterogenous and                                                  hyperechoic                               +----------+-------+-------+--------+---------------------------------+--------+ ICA Mid   183    41     40-59%                                            +----------+-------+-------+--------+---------------------------------+--------+ ICA Distal110    38                                                       +----------+-------+-------+--------+---------------------------------+--------+ ECA       247    23     >50%    smooth and heterogenous                   +----------+-------+-------+--------+---------------------------------+--------+ +----------+--------+--------+--------+-------------------+           PSV cm/sEDV cm/sDescribeArm Pressure (mmHG) +----------+--------+--------+--------+-------------------+ [id number]             Stenotic142                 +----------+--------+--------+--------+-------------------+ +---------+--------+--+--------+--+---------+ VertebralPSV cm/s96EDV cm/s26Antegrade +---------+--------+--+--------+--+---------+ Heterogeneous left thyroid lobe  Summary: Right Carotid: Velocities in the right ICA are consistent with a 40-59%                stenosis. Left Carotid: Velocities in the left ICA are consistent with a 40-59% stenosis.               The ECA appears >50% stenosed. Vertebrals:  Bilateral vertebral arteries demonstrate antegrade flow. Subclavians: Bilateral subclavian arteries were stenotic. *See table(s) above for measurements and observations. Suggest follow up study in 12 months. Electronically signed by Quay Burow MD on 01/15/2021 at 1:50:59 PM.    Final      Assessment/Plan 1. Atherosclerosis of native artery of both lower extremities with intermittent claudication (HCC)   Recommend:  The patient has evidence of atherosclerosis of the lower extremities with claudication.  The patient does not voice lifestyle limiting changes at this point in time.  Noninvasive studies do not suggest clinically significant change.  No invasive studies, angiography or surgery at this time The patient should continue walking and begin a more  formal exercise program.  The patient should continue antiplatelet therapy and aggressive treatment of the lipid abnormalities  No changes in the patient's medications at this time  The patient should continue wearing graduated compression socks 10-15 mmHg strength to control the mild edema.   - VAS [place] ABI WITH/WO TBI; Future  2. Bilateral carotid artery stenosis Recommend:  Given the patient's asymptomatic subcritical stenosis no further invasive testing or surgery at this time.  Duplex ultrasound shows 40-59% stenosis bilaterally.  Continue antiplatelet therapy as prescribed Continue management of CAD, HTN and Hyperlipidemia Healthy heart diet,  encouraged exercise at least 4 times per week Follow up in 6 months with duplex ultrasound and physical exam.   - VAS US CAROTID; Future  3. Essential hypertension Continue antihypertensive medications as already ordered, these medications have been reviewed and there are no changes at this time.   4. Controlled type 2 diabetes mellitus without complication, without long-term current use of insulin ([place]) Continue hypoglycemic medications as already ordered, these medications have been reviewed and there are no changes at this time.  Hgb A1C to be monitored as already arranged by primary service   5. Mixed hyperlipidemia Continue statin as ordered and reviewed, no changes at this time     Hortencia Pilar, MD  02/09/2021 1:09 PM

## 2021-02-12 ENCOUNTER — Encounter: Payer: Self-pay | Admitting: Physician Assistant

## 2021-02-12 ENCOUNTER — Other Ambulatory Visit: Payer: Self-pay

## 2021-02-12 ENCOUNTER — Ambulatory Visit: Payer: Medicare PPO | Admitting: Physician Assistant

## 2021-02-12 VITALS — BP 167/58 | HR 74 | Ht 70.0 in | Wt 201.0 lb

## 2021-02-12 DIAGNOSIS — R338 Other retention of urine: Secondary | ICD-10-CM

## 2021-02-12 DIAGNOSIS — N481 Balanitis: Secondary | ICD-10-CM | POA: Diagnosis not present

## 2021-02-12 DIAGNOSIS — N401 Enlarged prostate with lower urinary tract symptoms: Secondary | ICD-10-CM

## 2021-02-12 MED ORDER — CLOTRIMAZOLE 1 % EX CREA
TOPICAL_CREAM | CUTANEOUS | 0 refills | Status: DC
Start: 2021-02-12 — End: 2021-05-18

## 2021-02-12 NOTE — Progress Notes (Signed)
02/12/2021 12:11 PM   Nicholas Nolan [birth year] [id number]  CC: Chief Complaint  Patient presents with  . Other    Swollen penis    HPI: Nicholas Nolan is a 70 y.o. 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  Notably, he was seen in the ED 4 days ago with acute urinary retention and Foley catheter was placed with bladder scan 999 mL.  He is scheduled for voiding trial with me in 4 days.  Today he reports penile swelling, itching, and discomfort.  He has been circumcised.  He denies a latex allergy.  He reports discomfort associated with his Foley catheter and requested to be removed today.  His wife is a Marine scientist and has previously removed his catheters at home.   PMH: Past Medical History:  Diagnosis Date  . Arthritis    hands  . Back pain    leg pain  . CAD (coronary artery disease)    a. 10/2019 Cor CTA: LM nl, LAD nl, LCX distal dzs w/ FFR 0.65-0.75. RCA nl-->Med Rx.  . CKD (chronic kidney disease), stage III ([place])   . Claudication ([place])   . Diabetes mellitus    type 2  . Diastolic dysfunction    a. 08/2019 Echo: EF 60-65%, mild LVH, impaired relaxation. Nl RV size/fxn. Nl LA szie.  Marland Kitchen History of urinary retention   . Hyperlipidemia   . Hypertension    a. 05/2019 renal u/s concerning for R RAS; b. 06/2019 Renal artery angio: no more than15% bilat RAS.  Marland Kitchen Neuropathy    feet  . 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 Wears dentures    full upper    Surgical History: Past Surgical History:  Procedure Laterality Date  . BASAL CELL CARCINOMA EXCISION    . COLONOSCOPY WITH PROPOFOL N/A 12/25/2019   Procedure: COLONOSCOPY WITH PROPOFOL;  Surgeon: Lin Landsman, MD;  Location: [facility];  Service: Endoscopy;  Laterality: N/A;  Diabetic - injectable and oral meds  . LUMBAR LAMINECTOMY/DECOMPRESSION MICRODISCECTOMY  01/10/2012    Procedure: LUMBAR LAMINECTOMY/DECOMPRESSION MICRODISCECTOMY 2 LEVELS;  Surgeon: Hosie Spangle, MD;  Location: [facility];  Service: Neurosurgery;  Laterality: Bilateral;  Lumbar four-Sacral One laminectomies  . LUNG SURGERY Right 11/22/12   right upper lobe  . POLYPECTOMY N/A 12/25/2019   Procedure: POLYPECTOMY;  Surgeon: Lin Landsman, MD;  Location: [facility];  Service: Endoscopy;  Laterality: N/A;  . PROSTATE SURGERY     biopsy-due to elelvated PSA  . RENAL ANGIOGRAPHY Right 07/02/2019   Procedure: RENAL ANGIOGRAPHY;  Surgeon: Algernon Huxley, MD;  Location: [facility];  Service: Cardiovascular;  Laterality: Right;  . SHOULDER SURGERY    . VASECTOMY      Home Medications:  Allergies as of 02/12/2021      Reactions   Codeine Itching   And hyper also   Hydromorphone Itching   Morphine And Related Itching      Medication List       Accurate as of February 12, 2021 12:11 PM. If you have any questions, ask your nurse or doctor.        albuterol 108 (90 Base) MCG/ACT inhaler Commonly known as: VENTOLIN HFA INHALE 1-2 PUFFS INTO THE LUNGS EVERY 4 (FOUR) HOURS AS NEEDED FOR WHEEZING OR SHORTNESS OF BREATH.   amLODipine 10  MG tablet Commonly known as: NORVASC TAKE 1 TABLET BY MOUTH EVERY DAY   aspirin EC 81 MG tablet Take 1 tablet (81 mg total) by mouth daily. Swallow whole.   atorvastatin 80 MG tablet Commonly known as: LIPITOR TAKE 1 TABLET BY MOUTH EVERY DAY   carvedilol 6.25 MG tablet Commonly known as: COREG Take 1 tablet (6.25 mg total) by mouth 2 (two) times daily.   cilostazol 100 MG tablet Commonly known as: PLETAL Take 1 tablet (100 mg total) by mouth 2 (two) times daily.   clotrimazole 1 % cream Commonly known as: Clotrimazole Anti-Fungal Apply topically twice daily for 7 days. Started by: Debroah Loop, PA-C   Dulaglutide 1.5 MG/0.5ML Sopn Inject 1.5 mg into the skin once a week.   Farxiga 10 MG Tabs tablet Generic  drug: dapagliflozin propanediol Take 10 mg by mouth every morning.   hydrALAZINE 25 MG tablet Commonly known as: APRESOLINE Take 1 tablet (25 mg total) by mouth in the morning and at bedtime.   olmesartan 40 MG tablet Commonly known as: BENICAR Take 1 tablet by mouth daily.   OneTouch Verio test strip Generic drug: glucose blood USE ONCE DAILY. USE AS INSTRUCTED.   tamsulosin 0.4 MG Caps capsule Commonly known as: FLOMAX Take 1 capsule (0.4 mg total) by mouth daily after supper.       Allergies:  Allergies  Allergen Reactions  . Codeine Itching    And hyper also  . Hydromorphone Itching  . Morphine And Related Itching    Family History: Family History  Problem Relation Age of Onset  . Cancer Father        prostate  . Heart disease Father        MI  . Stroke Father   . Cancer Brother        liver    Social History:   reports that he quit smoking about 8 years ago. His smoking use included cigarettes. He has a 90.00 pack-year smoking history. His smokeless tobacco use includes snuff. He reports that he does not drink alcohol and does not use drugs.  Physical Exam: BP (!) 167/58   Pulse 74   Ht 5\' 10"  (1.778 m)   Wt 201 lb (91.2 kg)   BMI 28.84 kg/m   Constitutional:  Alert and oriented, no acute distress, nontoxic appearing HEENT: [place], AT Cardiovascular: No clubbing, cyanosis, or edema Respiratory: Normal respiratory effort, no increased work of breathing GU: Foley catheter in place draining clear, yellow urine.  Mild erythema and edema of the glans penis without discharge, purulence, or satellite lesions.  Circumcised penis. Skin: No rashes, bruises or suspicious lesions Neurologic: Grossly intact, no focal deficits, moving all 4 extremities Psychiatric: Normal mood and affect  Assessment & Plan:   1. Balanitis Prescribing clotrimazole ointment to be applied topically twice daily x7 days for empiric treatment of balanitis. - clotrimazole (CLOTRIMAZOLE  ANTI-FUNGAL) 1 % cream; Apply topically twice daily for 7 days.  Dispense: 45 g; Refill: 0  2. Benign prostatic hyperplasia with urinary retention I explained that given his significantly elevated PVR, I do not recommend Foley catheter removal before his planned voiding trial with me in 4 days.  Patient reports he is unable to wait that long due to discomfort in his penis.  I again explained that I do not recommend this, however he insists on catheter removal, okay for his wife to remove the catheter at home tonight with plans for PVR tomorrow morning.  Patient is in  agreement with this plan.  I provided him with an empty 10 cc syringe to aid with Foley removal.  He expressed understanding.  Return in about 1 day (around 02/13/2021) for PVR following at-home Foley removal.  Debroah Loop, [facility]  [facility] [address] [place], [place] [address] [phone]

## 2021-02-12 NOTE — Patient Instructions (Signed)
Have your wife remove your catheter at home late tonight. Return to clinic tomorrow for a bladder scan to make sure you're emptying well. If you are unable to urinate overnight and have abdominal pain, please come to clinic earlier than your scheduled appointment for assistance; we open at 8:00am.

## 2021-02-13 ENCOUNTER — Ambulatory Visit: Payer: Self-pay | Admitting: Physician Assistant

## 2021-02-16 ENCOUNTER — Encounter: Payer: Self-pay | Admitting: Cardiology

## 2021-02-16 ENCOUNTER — Ambulatory Visit: Payer: Medicare PPO | Admitting: Physician Assistant

## 2021-02-16 ENCOUNTER — Other Ambulatory Visit: Payer: Self-pay

## 2021-02-16 ENCOUNTER — Ambulatory Visit: Payer: Medicare PPO | Admitting: Cardiology

## 2021-02-16 VITALS — BP 160/60 | HR 64 | Ht 70.0 in | Wt 200.0 lb

## 2021-02-16 DIAGNOSIS — R338 Other retention of urine: Secondary | ICD-10-CM | POA: Diagnosis not present

## 2021-02-16 DIAGNOSIS — I10 Essential (primary) hypertension: Secondary | ICD-10-CM | POA: Diagnosis not present

## 2021-02-16 DIAGNOSIS — N401 Enlarged prostate with lower urinary tract symptoms: Secondary | ICD-10-CM

## 2021-02-16 DIAGNOSIS — I739 Peripheral vascular disease, unspecified: Secondary | ICD-10-CM

## 2021-02-16 DIAGNOSIS — I2510 Atherosclerotic heart disease of native coronary artery without angina pectoris: Secondary | ICD-10-CM

## 2021-02-16 DIAGNOSIS — I779 Disorder of arteries and arterioles, unspecified: Secondary | ICD-10-CM

## 2021-02-16 LAB — BLADDER SCAN AMB NON-IMAGING

## 2021-02-16 MED ORDER — OLMESARTAN MEDOXOMIL 40 MG PO TABS: 40.0000 mg | ORAL_TABLET | Freq: Every day | ORAL | 5 refills | Status: DC

## 2021-02-16 NOTE — Progress Notes (Signed)
Cardiology Office Note:    Date:  02/16/2021   ID:  Nicholas Nolan, DOB July 09, 1951, MRN [id number]  PCP:  Mar Daring, PA-C  Cardiologist:  Kate Sable, MD  Electrophysiologist:  None   Referring MD: Florian Buff*   Chief Complaint  Patient presents with  . Other    Follow up post Carotid. Meds reviewed verbally with patient.     History of Present Illness:    Nicholas Nolan is a 70 y.o. male with a hx of CAD (CCTA 10/2019-Ca score 1689, mid LAD, LCx disease, FFRct with no sig obstruction), PAD, CKD, hypertension, diabetes, hyperlipidemia, former smoker x40+ years who presents for follow-up.    Last seen due to difficulty in controlling hypertension and PAD.  Carotid bruit noted on last visit Toprol-XL.  Carotid ultrasound was ordered showing bilateral carotid artery stenosis.  Was evaluated by vascular surgery.  Serial monitoring recommended as patient is asymptomatic and stenosis was not significant.    He ran out of his Benicar, has not taken this for the past month.  States blood pressures have been elevated at home.  Feels well otherwise, has no other concerns at this time.   Prior notes  Renal ultrasound and renal angiogram were performed 06/2019 to evaluate for possible secondary causes but that was normal  Echo 08/2019 showed normal systolic function, EF 60 to 65%, impaired relaxation, mild LVH. PAD, evaluated by interventional cardiology, angio being deferred for now due to risk of CIN   Past Medical History:  Diagnosis Date  . Arthritis    hands  . Back pain    leg pain  . CAD (coronary artery disease)    a. 10/2019 Cor CTA: LM nl, LAD nl, LCX distal dzs w/ FFR 0.65-0.75. RCA nl-->Med Rx.  . CKD (chronic kidney disease), stage III ([place])   . Claudication ([place])   . Diabetes mellitus    type 2  . Diastolic dysfunction    a. 08/2019 Echo: EF 60-65%, mild LVH, impaired relaxation. Nl RV size/fxn. Nl LA szie.  Marland Kitchen History of urinary  retention   . Hyperlipidemia   . Hypertension    a. 05/2019 renal u/s concerning for R RAS; b. 06/2019 Renal artery angio: no more than15% bilat RAS.  Marland Kitchen Neuropathy    feet  . Primary cancer of right upper lobe of lung ([place]) 2014   RUL Lobectomy  . Wears dentures    full upper    Past Surgical History:  Procedure Laterality Date  . BASAL CELL CARCINOMA EXCISION    . COLONOSCOPY WITH PROPOFOL N/A 12/25/2019   Procedure: COLONOSCOPY WITH PROPOFOL;  Surgeon: Lin Landsman, MD;  Location: [facility];  Service: Endoscopy;  Laterality: N/A;  Diabetic - injectable and oral meds  . LUMBAR LAMINECTOMY/DECOMPRESSION MICRODISCECTOMY  01/10/2012   Procedure: LUMBAR LAMINECTOMY/DECOMPRESSION MICRODISCECTOMY 2 LEVELS;  Surgeon: Hosie Spangle, MD;  Location: [facility];  Service: Neurosurgery;  Laterality: Bilateral;  Lumbar four-Sacral One laminectomies  . LUNG SURGERY Right 11/22/12   right upper lobe  . POLYPECTOMY N/A 12/25/2019   Procedure: POLYPECTOMY;  Surgeon: Lin Landsman, MD;  Location: [facility];  Service: Endoscopy;  Laterality: N/A;  . PROSTATE SURGERY     biopsy-due to elelvated PSA  . RENAL ANGIOGRAPHY Right 07/02/2019   Procedure: RENAL ANGIOGRAPHY;  Surgeon: Algernon Huxley, MD;  Location: [facility];  Service: Cardiovascular;  Laterality: Right;  . SHOULDER SURGERY    .  VASECTOMY      Current Medications: Current Meds  Medication Sig  . albuterol (VENTOLIN HFA) 108 (90 Base) MCG/ACT inhaler INHALE 1-2 PUFFS INTO THE LUNGS EVERY 4 (FOUR) HOURS AS NEEDED FOR WHEEZING OR SHORTNESS OF BREATH.  Marland Kitchen amLODipine (NORVASC) 10 MG tablet TAKE 1 TABLET BY MOUTH EVERY DAY  . aspirin EC 81 MG tablet Take 1 tablet (81 mg total) by mouth daily. Swallow whole.  Marland Kitchen atorvastatin (LIPITOR) 80 MG tablet TAKE 1 TABLET BY MOUTH EVERY DAY  . carvedilol (COREG) 6.25 MG tablet Take 1 tablet (6.25 mg total) by mouth 2 (two) times daily.  . cilostazol (PLETAL) 100 MG  tablet Take 1 tablet (100 mg total) by mouth 2 (two) times daily.  . clotrimazole (CLOTRIMAZOLE ANTI-FUNGAL) 1 % cream Apply topically twice daily for 7 days.  . Dulaglutide 1.5 MG/0.5ML SOPN Inject 1.5 mg into the skin once a week.   Marland Kitchen FARXIGA 10 MG TABS tablet Take 10 mg by mouth every morning.  Marland Kitchen glucose blood (ONETOUCH VERIO) test strip USE ONCE DAILY. USE AS INSTRUCTED.  . hydrALAZINE (APRESOLINE) 25 MG tablet Take 1 tablet (25 mg total) by mouth in the morning and at bedtime.  . tamsulosin (FLOMAX) 0.4 MG CAPS capsule Take 1 capsule (0.4 mg total) by mouth daily after supper.     Allergies:   Codeine, Hydromorphone, and Morphine and related   Social History   Socioeconomic History  . Marital status: Married    Spouse name: Not on file  . Number of children: 4  . Years of education: Not on file  . Highest education level: Associate degree: occupational, Hotel manager, or vocational program  Occupational History  . Occupation: retired  Tobacco Use  . Smoking status: Former Smoker    Packs/day: 2.00    Years: 45.00    Pack years: 90.00    Types: Cigarettes    Quit date: 11/28/2012    Years since quitting: 8.2  . Smokeless tobacco: Current User    Types: Snuff  . Tobacco comment: Quit in 2014  Vaping Use  . Vaping Use: Never used  Substance and Sexual Activity  . Alcohol use: No  . Drug use: No  . Sexual activity: Yes    Birth control/protection: None  Other Topics Concern  . Not on file  Social History Narrative  . Not on file   Social Determinants of Health   Financial Resource Strain: Low Risk   . Difficulty of Paying Living Expenses: Not hard at all  Food Insecurity: No Food Insecurity  . Worried About Charity fundraiser in the Last Year: Never true  . Ran Out of Food in the Last Year: Never true  Transportation Needs: No Transportation Needs  . Lack of Transportation (Medical): No  . Lack of Transportation (Non-Medical): No  Physical Activity: Inactive  .  Days of Exercise per Week: 0 days  . Minutes of Exercise per Session: 0 min  Stress: No Stress Concern Present  . Feeling of Stress : Not at all  Social Connections: Moderately Isolated  . Frequency of Communication with Friends and Family: More than three times a week  . Frequency of Social Gatherings with Friends and Family: More than three times a week  . Attends Religious Services: Never  . Active Member of Clubs or Organizations: No  . Attends Archivist Meetings: Never  . Marital Status: Married     Family History: The patient's family history includes Cancer in his brother  and father; Heart disease in his father; Stroke in his father.  ROS:   Please see the history of present illness.     All other systems reviewed and are negative.  EKGs/Labs/Other Studies Reviewed:    The following studies were reviewed today:   EKG:  EKG not ordered today.    Recent Labs: 06/09/2020: TSH 0.877 02/08/2021: BUN 46; Creatinine, Ser 1.99; Potassium 4.7; Sodium 139  Recent Lipid Panel    Component Value Date/Time   CHOL 126 08/21/2020 1246   CHOL 221 (H) 10/19/2019 1046   TRIG 89 08/21/2020 1246   HDL 48 08/21/2020 1246   HDL 43 10/19/2019 1046   CHOLHDL 2.6 08/21/2020 1246   VLDL 18 08/21/2020 1246   LDLCALC 60 08/21/2020 1246   LDLCALC 150 (H) 10/19/2019 1046    Physical Exam:    VS:  BP (!) 160/60 (BP Location: Left Arm, Patient Position: Sitting, Cuff Size: Normal)   Pulse 64   Ht 5\' 10"  (1.778 m)   Wt 200 lb (90.7 kg)   SpO2 97%   BMI 28.70 kg/m     Wt Readings from Last 3 Encounters:  02/16/21 200 lb (90.7 kg)  02/12/21 201 lb (91.2 kg)  02/09/21 201 lb (91.2 kg)     GEN:  Well nourished, well developed in no acute distress HEENT: Normal NECK: No JVD; bilateral carotid bruit noted worse on the right. CARDIAC: RRR, no murmurs, rubs, gallops RESPIRATORY:  Clear to auscultation without rales, wheezing or rhonchi  ABDOMEN: Soft, non-tender,  non-distended MUSCULOSKELETAL:  No edema; No deformity, extremities appear warm, DP difficult to palpate SKIN: Warm and dry NEUROLOGIC:  Alert and oriented x 3 PSYCHIATRIC:  Normal affect   ASSESSMENT:    1. Primary hypertension   2. Coronary artery disease involving native coronary artery of native heart without angina pectoris   3. Bilateral carotid artery disease, unspecified type ([place])   4. PAD (peripheral artery disease) ([facility])    PLAN:    In order of problems listed above:  1. Hypertension, BP elevated.  Likely from not taking Benicar.  Restart Benicar 40 mg daily, continue amlodipine, Coreg, hydralazine. 2. nonobstructive CAD.  Asymptomatic.  Continue aspirin, Lipitor, beta-blocker. 3. Bilateral carotid bruits, carotid ultrasound with bilateral carotid artery disease.  Continue aspirin, Lipitor.  Followed by vascular surgery . 4. Peripheral arterial disease, continue aspirin, cilostazol, Lipitor.  Follow-up in 3 months for BP  Medication Adjustments/Labs and Tests Ordered: Current medicines are reviewed at length with the patient today.  Concerns regarding medicines are outlined above.  No orders of the defined types were placed in this encounter.  Meds ordered this encounter  Medications  . olmesartan (BENICAR) 40 MG tablet    Sig: Take 1 tablet (40 mg total) by mouth daily.    Dispense:  30 tablet    Refill:  5    Patient Instructions  Medication Instructions:  Your physician recommends that you continue on your current medications as directed. Please refer to the Current Medication list given to you today.  *If you need a refill on your cardiac medications before your next appointment, please call your pharmacy*    Lab Work: None ordered If you have labs (blood work) drawn today and your tests are completely normal, you will receive your results only by: Marland Kitchen MyChart Message (if you have MyChart) OR . A paper copy in the mail If you have any lab test that is  abnormal or we need to change your  treatment, we will call you to review the results.   Testing/Procedures: None ordered   Follow-Up: At [facility], you and your health needs are our priority.  As part of our continuing mission to provide you with exceptional heart care, we have created designated Provider Care Teams.  These Care Teams include your primary Cardiologist (physician) and Advanced Practice Providers (APPs -  Physician Assistants and Nurse Practitioners) who all work together to provide you with the care you need, when you need it.  We recommend signing up for the patient portal called "MyChart".  Sign up information is provided on this After Visit Summary.  MyChart is used to connect with patients for Virtual Visits (Telemedicine).  Patients are able to view lab/test results, encounter notes, upcoming appointments, etc.  Non-urgent messages can be sent to your provider as well.   To learn more about what you can do with MyChart, go to [email or web address].    Your next appointment:   3 month(s)  The format for your next appointment:   In Person  Provider:   Kate Sable, MD   Other Instructions      Signed, Kate Sable, MD  02/16/2021 12:35 PM    [place]

## 2021-02-16 NOTE — Patient Instructions (Signed)
Medication Instructions:  Your physician recommends that you continue on your current medications as directed. Please refer to the Current Medication list given to you today.  *If you need a refill on your cardiac medications before your next appointment, please call your pharmacy*    Lab Work: None ordered If you have labs (blood work) drawn today and your tests are completely normal, you will receive your results only by: Marland Kitchen MyChart Message (if you have MyChart) OR . A paper copy in the mail If you have any lab test that is abnormal or we need to change your treatment, we will call you to review the results.   Testing/Procedures: None ordered   Follow-Up: At [facility], you and your health needs are our priority.  As part of our continuing mission to provide you with exceptional heart care, we have created designated Provider Care Teams.  These Care Teams include your primary Cardiologist (physician) and Advanced Practice Providers (APPs -  Physician Assistants and Nurse Practitioners) who all work together to provide you with the care you need, when you need it.  We recommend signing up for the patient portal called "MyChart".  Sign up information is provided on this After Visit Summary.  MyChart is used to connect with patients for Virtual Visits (Telemedicine).  Patients are able to view lab/test results, encounter notes, upcoming appointments, etc.  Non-urgent messages can be sent to your provider as well.   To learn more about what you can do with MyChart, go to [email or web address].    Your next appointment:   3 month(s)  The format for your next appointment:   In Person  Provider:   Kate Sable, MD   Other Instructions

## 2021-02-16 NOTE — Progress Notes (Signed)
Patient returned to clinic today for PVR following at home Foley catheter removal last night by his wife, an Therapist, sports.  Patient reports using clotrimazole cream as prescribed for management of balanitis; his symptoms have improved with this.  Patient has voided multiple times since Foley removal and he denies lower abdominal discomfort today.  PVR 0 mL.  Results for orders placed or performed in visit on 02/16/21  Bladder Scan (Post Void Residual) in office  Result Value Ref Range   Scan Result 30mL     Voiding trial passed.  With his history of recurrent urinary retention, I offered him finasteride versus bladder outlet consultation for management of his enlarged prostate.  I explained that finasteride comes with a primary side effect of erectile dysfunction.  Patient is not interested in pharmacotherapy due to ED side effect at this time.  He prefers to defer bladder outlet consultation until and unless he develops another episode of urinary retention.  I am in agreement this plan.  We reviewed return precautions today and I encouraged him to follow-up in clinic if he develops retention during office hours.  He expressed understanding.  Return if symptoms worsen or fail to improve.   I spent 12 minutes on the day of the encounter to include pre-visit record review, face-to-face time with the patient, and post-visit ordering of tests.   Debroah Loop, PA-C  02/16/21 1:59 PM

## 2021-02-18 ENCOUNTER — Telehealth: Payer: Self-pay | Admitting: Physician Assistant

## 2021-02-18 DIAGNOSIS — N401 Enlarged prostate with lower urinary tract symptoms: Secondary | ICD-10-CM

## 2021-02-18 NOTE — Telephone Encounter (Signed)
Pt called to let you know he would like to try the RX you discussed at his appt on Monday.  He wasn't sure of the name.  [facility] in [place].

## 2021-02-19 MED ORDER — FINASTERIDE 5 MG PO TABS: 5.0000 mg | ORAL_TABLET | Freq: Every day | ORAL | 11 refills | Status: DC

## 2021-02-19 NOTE — Telephone Encounter (Signed)
Patient notified, expressed understanding.  

## 2021-02-19 NOTE — Telephone Encounter (Signed)
Rx sent; please notify patient.

## 2021-02-25 ENCOUNTER — Other Ambulatory Visit: Payer: Self-pay | Admitting: Cardiovascular Disease

## 2021-04-02 ENCOUNTER — Other Ambulatory Visit: Payer: Self-pay | Admitting: Cardiovascular Disease

## 2021-04-15 ENCOUNTER — Other Ambulatory Visit: Payer: Self-pay | Admitting: Urology

## 2021-04-20 ENCOUNTER — Ambulatory Visit: Payer: Self-pay | Admitting: Urology

## 2021-04-20 ENCOUNTER — Encounter: Payer: Self-pay | Admitting: Urology

## 2021-04-20 NOTE — Progress Notes (Deleted)
04/20/2021 8:02 AM   Nicholas Nolan 1951-04-11 [id number]  Referring provider: Mar Daring, PA-C [place],  [place] [address]  No chief complaint on file.   Urologic history: 1.  Elevated PSA -Prostate biopsy 2010 PSA 5.4; volume 64 cc; benign -PSA 8.3 05/2016; MRI 103 cc volume, PI-RADS 3  -MR fusion biopsy 08/2016, 82 cc volume, ROI and 12 core template benign -PSA rise 16.8 10/2019; repeat PSA 12/2019 remains elevated 16.3 -Repeat MRI 128 cc gland; contrast not given secondary to GFR 2 "intermediate suspicious lesions" seen in left PZ with mild capsular bulging and 2 other lesions NTZ -MR fusion biopsy 02/2020; 139 cc gland; 4 biopsies each were taken of 3 ROI lesions in addition to a standard 12 core biopsy; all cores with benign prostate tissue and foci of acute/chronic inflammation   2.  BPH with history urinary retention -Successful voiding trial -On tamsulosin  HPI: 70 y.o. male presents for follow-up visit.  Episode urinary retention and balanitis April 2022 with successful voiding trial; was seen by Thomas Hoff, PA   PMH: Past Medical History:  Diagnosis Date   Arthritis    hands   Back pain    leg pain   CAD (coronary artery disease)    a. 10/2019 Cor CTA: LM nl, LAD nl, LCX distal dzs w/ FFR 0.65-0.75. RCA nl-->Med Rx.   CKD (chronic kidney disease), stage III ([facility])    Claudication ([facility])    Diabetes mellitus    type 2   Diastolic dysfunction    a. 08/2019 Echo: EF 60-65%, mild LVH, impaired relaxation. Nl RV size/fxn. Nl LA szie.   History of urinary retention    Hyperlipidemia    Hypertension    a. 05/2019 renal u/s concerning for R RAS; b. 06/2019 Renal artery angio: no more than15% bilat RAS.   Neuropathy    feet   Primary cancer of right upper lobe of lung ([place]) 2014   RUL Lobectomy   Wears dentures    full upper    Surgical History: Past Surgical History:  Procedure Laterality Date   BASAL CELL  CARCINOMA EXCISION     COLONOSCOPY WITH PROPOFOL N/A 12/25/2019   Procedure: COLONOSCOPY WITH PROPOFOL;  Surgeon: Lin Landsman, MD;  Location: [facility];  Service: Endoscopy;  Laterality: N/A;  Diabetic - injectable and oral meds   LUMBAR LAMINECTOMY/DECOMPRESSION MICRODISCECTOMY  01/10/2012   Procedure: LUMBAR LAMINECTOMY/DECOMPRESSION MICRODISCECTOMY 2 LEVELS;  Surgeon: Hosie Spangle, MD;  Location: [facility];  Service: Neurosurgery;  Laterality: Bilateral;  Lumbar four-Sacral One laminectomies   LUNG SURGERY Right 11/22/12   right upper lobe   POLYPECTOMY N/A 12/25/2019   Procedure: POLYPECTOMY;  Surgeon: Lin Landsman, MD;  Location: [facility];  Service: Endoscopy;  Laterality: N/A;   PROSTATE SURGERY     biopsy-due to elelvated PSA   RENAL ANGIOGRAPHY Right 07/02/2019   Procedure: RENAL ANGIOGRAPHY;  Surgeon: Algernon Huxley, MD;  Location: [facility];  Service: Cardiovascular;  Laterality: Right;   SHOULDER SURGERY     VASECTOMY      Home Medications:  Allergies as of 04/20/2021       Reactions   Codeine Itching   And hyper also   Hydromorphone Itching   Morphine And Related Itching        Medication List        Accurate as of April 20, 2021  8:02 AM. If you have any  questions, ask your nurse or doctor.          albuterol 108 (90 Base) MCG/ACT inhaler Commonly known as: VENTOLIN HFA INHALE 1-2 PUFFS INTO THE LUNGS EVERY 4 (FOUR) HOURS AS NEEDED FOR WHEEZING OR SHORTNESS OF BREATH.   amLODipine 10 MG tablet Commonly known as: NORVASC TAKE 1 TABLET BY MOUTH EVERY DAY   aspirin EC 81 MG tablet Take 1 tablet (81 mg total) by mouth daily. Swallow whole.   atorvastatin 80 MG tablet Commonly known as: LIPITOR TAKE 1 TABLET BY MOUTH EVERY DAY   carvedilol 6.25 MG tablet Commonly known as: COREG Take 1 tablet (6.25 mg total) by mouth 2 (two) times daily.   cilostazol 100 MG tablet Commonly known as: PLETAL Take 1 tablet  (100 mg total) by mouth 2 (two) times daily.   cilostazol 50 MG tablet Commonly known as: PLETAL TAKE 1 TABLET BY MOUTH TWICE A DAY   clotrimazole 1 % cream Commonly known as: Clotrimazole Anti-Fungal Apply topically twice daily for 7 days.   Dulaglutide 1.5 MG/0.5ML Sopn Inject 1.5 mg into the skin once a week.   Farxiga 10 MG Tabs tablet Generic drug: dapagliflozin propanediol Take 10 mg by mouth every morning.   finasteride 5 MG tablet Commonly known as: PROSCAR Take 1 tablet (5 mg total) by mouth daily.   hydrALAZINE 25 MG tablet Commonly known as: APRESOLINE Take 1 tablet (25 mg total) by mouth in the morning and at bedtime.   olmesartan 40 MG tablet Commonly known as: BENICAR Take 1 tablet (40 mg total) by mouth daily.   OneTouch Verio test strip Generic drug: glucose blood USE ONCE DAILY. USE AS INSTRUCTED.   tamsulosin 0.4 MG Caps capsule Commonly known as: FLOMAX TAKE 1 CAPSULE (0.4 MG TOTAL) BY MOUTH DAILY AFTER SUPPER.        Allergies:  Allergies  Allergen Reactions   Codeine Itching    And hyper also   Hydromorphone Itching   Morphine And Related Itching    Family History: Family History  Problem Relation Age of Onset   Cancer Father        prostate   Heart disease Father        MI   Stroke Father    Cancer Brother        liver    Social History:  reports that he quit smoking about 8 years ago. His smoking use included cigarettes. He has a 90.00 pack-year smoking history. His smokeless tobacco use includes snuff. He reports that he does not drink alcohol and does not use drugs.   Physical Exam: There were no vitals taken for this visit.  Constitutional:  Alert and oriented, No acute distress. HEENT: [place] AT, moist mucus membranes.  Trachea midline, no masses. Cardiovascular: No clubbing, cyanosis, or edema. Respiratory: Normal respiratory effort, no increased work of breathing. GI: Abdomen is soft, nontender, nondistended, no abdominal  masses GU: No CVA tenderness Lymph: No cervical or inguinal lymphadenopathy. Skin: No rashes, bruises or suspicious lesions. Neurologic: Grossly intact, no focal deficits, moving all 4 extremities. Psychiatric: Normal mood and affect.  Laboratory Data: Lab Results  Component Value Date   WBC 9.3 10/03/2019   HGB 10.5 (L) 10/03/2019   HCT 31.7 (L) 10/03/2019   MCV 89.8 10/03/2019   PLT 231 10/03/2019    Lab Results  Component Value Date   CREATININE 1.99 (H) 02/08/2021    No results found for: PSA  No results found for: TESTOSTERONE  Lab  Results  Component Value Date   HGBA1C 7.0 03/19/2020    Urinalysis    Component Value Date/Time   COLORURINE STRAW (A) 02/08/2021 1201   APPEARANCEUR CLEAR (A) 02/08/2021 1201   APPEARANCEUR Clear 10/20/2020 0907   LABSPEC 1.008 02/08/2021 1201   PHURINE 5.0 02/08/2021 1201   GLUCOSEU >=500 (A) 02/08/2021 1201   HGBUR NEGATIVE 02/08/2021 1201   [place] 02/08/2021 1201   BILIRUBINUR Negative 10/20/2020 0907   KETONESUR NEGATIVE 02/08/2021 1201   PROTEINUR 100 (A) 02/08/2021 1201   UROBILINOGEN 0.2 11/29/2016 1242   NITRITE NEGATIVE 02/08/2021 1201   LEUKOCYTESUR NEGATIVE 02/08/2021 1201    Lab Results  Component Value Date   LABMICR See below: 10/20/2020   WBCUA 0-5 10/20/2020   LABEPIT None seen 10/20/2020   BACTERIA NONE SEEN 02/08/2021    Pertinent Imaging: No results found for this or any previous visit.  No results found for this or any previous visit.  No results found for this or any previous visit.  No results found for this or any previous visit.  Results for orders placed during the hospital encounter of 11/06/20  US RENAL  Narrative CLINICAL DATA:  Stage III B chronic kidney disease  EXAM: RENAL / URINARY TRACT ULTRASOUND COMPLETE  COMPARISON:  CT 12/27/2014  FINDINGS: Right Kidney:  Renal measurements: 11.2 x 5.0 x 5.1 cm = volume: 148 mL. Echogenicity within normal limits. No  mass or hydronephrosis visualized.  Left Kidney:  Renal measurements: 11.7 x 5.4 x 5.1 cm = volume: 170 mL. Echogenicity within normal limits. No mass or hydronephrosis visualized.  Bladder:  Mild bladder wall thickening with small postvoid residual of 69 mL.  Other:  Prostate enlargement measuring up to 6.5 cm.  IMPRESSION: No acute findings.  No hydronephrosis.  Prostate enlargement with bladder wall thickening and small postvoid residual.   Electronically Signed By: Rolm Baptise M.D. On: 11/07/2020 08:30  No results found for this or any previous visit.  No results found for this or any previous visit.  No results found for this or any previous visit.   Assessment & Plan:    There are no diagnoses linked to this encounter.  No follow-ups on file.  Abbie Sons, [place] [address], [place], [place] [address] [phone]

## 2021-04-30 DIAGNOSIS — N2581 Secondary hyperparathyroidism of renal origin: Secondary | ICD-10-CM | POA: Insufficient documentation

## 2021-04-30 DIAGNOSIS — N184 Chronic kidney disease, stage 4 (severe): Secondary | ICD-10-CM | POA: Insufficient documentation

## 2021-04-30 DIAGNOSIS — R809 Proteinuria, unspecified: Secondary | ICD-10-CM | POA: Insufficient documentation

## 2021-05-12 ENCOUNTER — Other Ambulatory Visit: Payer: Self-pay

## 2021-05-12 MED ORDER — CARVEDILOL 6.25 MG PO TABS: 6.2500 mg | ORAL_TABLET | Freq: Two times a day (BID) | ORAL | 0 refills | Status: DC

## 2021-05-15 ENCOUNTER — Ambulatory Visit: Payer: Medicare PPO | Admitting: Cardiology

## 2021-05-18 ENCOUNTER — Other Ambulatory Visit: Payer: Self-pay

## 2021-05-18 ENCOUNTER — Ambulatory Visit: Payer: Medicare PPO | Admitting: Urology

## 2021-05-18 ENCOUNTER — Encounter: Payer: Self-pay | Admitting: Urology

## 2021-05-18 VITALS — BP 143/58 | HR 77 | Ht 70.0 in | Wt 196.0 lb

## 2021-05-18 DIAGNOSIS — Z87898 Personal history of other specified conditions: Secondary | ICD-10-CM

## 2021-05-18 DIAGNOSIS — R338 Other retention of urine: Secondary | ICD-10-CM

## 2021-05-18 DIAGNOSIS — N401 Enlarged prostate with lower urinary tract symptoms: Secondary | ICD-10-CM | POA: Diagnosis not present

## 2021-05-18 DIAGNOSIS — R972 Elevated prostate specific antigen [PSA]: Secondary | ICD-10-CM | POA: Diagnosis not present

## 2021-05-18 LAB — URINALYSIS, COMPLETE
Bilirubin, UA: NEGATIVE
Ketones, UA: NEGATIVE
Leukocytes,UA: NEGATIVE
Nitrite, UA: NEGATIVE
RBC, UA: NEGATIVE
Specific Gravity, UA: 1.015 (ref 1.005–1.030)
Urobilinogen, Ur: 0.2 mg/dL (ref 0.2–1.0)
pH, UA: 5 (ref 5.0–7.5)

## 2021-05-18 LAB — BLADDER SCAN AMB NON-IMAGING: Scan Result: 47

## 2021-05-18 LAB — MICROSCOPIC EXAMINATION

## 2021-05-18 NOTE — Progress Notes (Signed)
05/18/2021 2:10 PM   Nicholas Nolan [birth year] [id number]  Referring provider: Mar Daring, PA-C [place],  [place] [address]  Chief Complaint  Patient presents with   Elevated PSA    Urologic history: 1.  Elevated PSA -Prostate biopsy 2010 PSA 5.4; volume 64 cc; benign -PSA 8.3 05/2016; MRI 103 cc volume, PI-RADS 3  -MR fusion biopsy 08/2016, 82 cc volume, ROI and 12 core template benign -PSA rise 16.8 10/2019; repeat PSA 12/2019 remains elevated 16.3 -Repeat MRI 128 cc gland; contrast not given secondary to GFR 2 "intermediate suspicious lesions" seen in left PZ with mild capsular bulging and 2 other lesions NTZ -MR fusion biopsy 02/2020; 139 cc gland; 4 biopsies each were taken of 3 ROI lesions in addition to a standard 12 core biopsy; all cores with benign prostate tissue and foci of acute/chronic inflammation   2.  BPH with history urinary retention -Successful voiding trial -On tamsulosin   HPI: 70 y.o. male presents for annual follow-up.  Saw [facility] 02/12/2021 for balanitis ED visit 4 days prior to that appointment for urinary retention with bladder scan >999 mL urine in Foley catheter placed Follow-up voiding trial with PVR 0 mL He elected to start finasteride 02/18/2021 however states since starting he has had chronic diarrhea that he feels is related to the medication.  He has not tried stopping the medication Last PSA February 2022 was 78   PMH: Past Medical History:  Diagnosis Date   Arthritis    hands   Back pain    leg pain   CAD (coronary artery disease)    a. 10/2019 Cor CTA: LM nl, LAD nl, LCX distal dzs w/ FFR 0.65-0.75. RCA nl-->Med Rx.   CKD (chronic kidney disease), stage III (HCC)    Claudication (HCC)    Diabetes mellitus    type 2   Diastolic dysfunction    a. 08/2019 Echo: EF 60-65%, mild LVH, impaired relaxation. Nl RV size/fxn. Nl LA szie.   History of urinary retention    Hyperlipidemia    Hypertension     a. 05/2019 renal u/s concerning for R RAS; b. 06/2019 Renal artery angio: no more than15% bilat RAS.   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   RUL Lobectomy   Wears dentures    full upper    Surgical History: Past Surgical History:  Procedure Laterality Date   BASAL CELL CARCINOMA EXCISION     COLONOSCOPY WITH PROPOFOL N/A 12/25/2019   Procedure: COLONOSCOPY WITH PROPOFOL;  Surgeon: Lin Landsman, MD;  Location: [facility];  Service: Endoscopy;  Laterality: N/A;  Diabetic - injectable and oral meds   LUMBAR LAMINECTOMY/DECOMPRESSION MICRODISCECTOMY  01/10/2012   Procedure: LUMBAR LAMINECTOMY/DECOMPRESSION MICRODISCECTOMY 2 LEVELS;  Surgeon: Hosie Spangle, MD;  Location: [facility];  Service: Neurosurgery;  Laterality: Bilateral;  Lumbar four-Sacral One laminectomies   LUNG SURGERY Right 11/22/12   right upper lobe   POLYPECTOMY N/A 12/25/2019   Procedure: POLYPECTOMY;  Surgeon: Lin Landsman, MD;  Location: [facility];  Service: Endoscopy;  Laterality: N/A;   PROSTATE SURGERY     biopsy-due to elelvated PSA   RENAL ANGIOGRAPHY Right 07/02/2019   Procedure: RENAL ANGIOGRAPHY;  Surgeon: Algernon Huxley, MD;  Location: [facility];  Service: Cardiovascular;  Laterality: Right;   SHOULDER SURGERY     VASECTOMY      Home Medications:  Allergies as of 05/18/2021       Reactions   Codeine Itching   And hyper also   Hydromorphone Itching   Morphine And Related Itching        Medication List        Accurate as of May 18, 2021  2:10 PM. If you have any questions, ask your nurse or doctor.          STOP taking these medications    albuterol 108 (90 Base) MCG/ACT inhaler Commonly known as: VENTOLIN HFA Stopped by: Abbie Sons, MD   clotrimazole 1 % cream Commonly known as: Clotrimazole Anti-Fungal Stopped by: Abbie Sons, MD       TAKE these medications    amLODipine 10 MG  tablet Commonly known as: NORVASC TAKE 1 TABLET BY MOUTH EVERY DAY   aspirin EC 81 MG tablet Take 1 tablet (81 mg total) by mouth daily. Swallow whole.   atorvastatin 80 MG tablet Commonly known as: LIPITOR TAKE 1 TABLET BY MOUTH EVERY DAY   carvedilol 6.25 MG tablet Commonly known as: COREG Take 1 tablet (6.25 mg total) by mouth 2 (two) times daily.   cilostazol 50 MG tablet Commonly known as: PLETAL TAKE 1 TABLET BY MOUTH TWICE A DAY What changed: Another medication with the same name was removed. Continue taking this medication, and follow the directions you see here. Changed by: Abbie Sons, MD   Dulaglutide 1.5 MG/0.5ML Sopn Inject 1.5 mg into the skin once a week.   Farxiga 10 MG Tabs tablet Generic drug: dapagliflozin propanediol Take 10 mg by mouth every morning.   finasteride 5 MG tablet Commonly known as: PROSCAR Take 1 tablet (5 mg total) by mouth daily.   hydrALAZINE 25 MG tablet Commonly known as: APRESOLINE Take 1 tablet (25 mg total) by mouth in the morning and at bedtime.   olmesartan 40 MG tablet Commonly known as: BENICAR Take 1 tablet (40 mg total) by mouth daily.   OneTouch Verio test strip Generic drug: glucose blood USE ONCE DAILY. USE AS INSTRUCTED.   tamsulosin 0.4 MG Caps capsule Commonly known as: FLOMAX TAKE 1 CAPSULE (0.4 MG TOTAL) BY MOUTH DAILY AFTER SUPPER.        Allergies:  Allergies  Allergen Reactions   Codeine Itching    And hyper also   Hydromorphone Itching   Morphine And Related Itching    Family History: Family History  Problem Relation Age of Onset   Cancer Father        prostate   Heart disease Father        MI   Stroke Father    Cancer Brother        liver    Social History:  reports that he quit smoking about 8 years ago. His smoking use included cigarettes. He has a 90.00 pack-year smoking history. His smokeless tobacco use includes snuff. He reports that he does not drink alcohol and does not  use drugs.   Physical Exam: BP (!) 143/58   Pulse 77   Ht 5\' 10"  (1.778 m)   Wt 196 lb (88.9 kg)   BMI 28.12 kg/m   Constitutional:  Alert and oriented, No acute distress. HEENT: [place] AT, moist mucus membranes.  Trachea midline, no masses. Cardiovascular: No clubbing, cyanosis, or edema. Respiratory: Normal respiratory effort, no increased work of breathing. GI: Abdomen is soft, nontender, nondistended, no abdominal masses   Assessment & Plan:    1. Benign prostatic hyperplasia  with LUTS Bladder scan PVR today 47 mL Diarrhea would not be a common side effect with finasteride however I recommended he discontinue and if the diarrhea resolves we could try dutasteride If diarrhea persist would recommend GI evaluation  2.  History urinary retention Resolved  3.  Elevated PSA PSA drawn today and if stable lab visit for PSA 6 months Office visit 1 year [place], MD  [facility] [address], [place], [place] [address] [phone]

## 2021-05-19 ENCOUNTER — Encounter: Payer: Self-pay | Admitting: Urology

## 2021-05-19 LAB — PSA: Prostate Specific Ag, Serum: 9.9 ng/mL — ABNORMAL HIGH (ref 0.0–4.0)

## 2021-05-29 ENCOUNTER — Ambulatory Visit: Payer: Self-pay

## 2021-05-29 NOTE — Telephone Encounter (Signed)
Third attempt to reach pt. Message left.

## 2021-05-29 NOTE — Telephone Encounter (Signed)
Pts wife,Denise is calling to schedule appt for the pt that has had diarrhea for 2 months with no abdominal pain. Pt is able to eat & drink. Apt was scheduled with Arnaldo Natal for 06/09/21. Please advise   2nd attempt to call patient. No answer, LVMTCB.

## 2021-06-01 ENCOUNTER — Ambulatory Visit: Payer: Self-pay | Admitting: *Deleted

## 2021-06-01 NOTE — Telephone Encounter (Signed)
Reason for Disposition  Diarrhea is a chronic symptom (recurrent or ongoing AND present > 4 weeks)  Answer Assessment - Initial Assessment Questions 1. DIARRHEA SEVERITY: "How bad is the diarrhea?" "How many more stools have you had in the past 24 hours than normal?"    - NO DIARRHEA (SCALE 0)   - MILD (SCALE 1-3): Few loose or mushy BMs; increase of 1-3 stools over normal daily number of stools; mild increase in ostomy output.   -  MODERATE (SCALE 4-7): Increase of 4-6 stools daily over normal; moderate increase in ostomy output. * SEVERE (SCALE 8-10; OR 'WORST POSSIBLE'): Increase of 7 or more stools daily over normal; moderate increase in ostomy output; incontinence.     8-9 times daily 2. ONSET: "When did the diarrhea begin?"      May 3. BM CONSISTENCY: "How loose or watery is the diarrhea?"      Watery 4. VOMITING: "Are you also vomiting?" If Yes, ask: "How many times in the past 24 hours?"      no 5. ABDOMINAL PAIN: "Are you having any abdominal pain?" If Yes, ask: "What does it feel like?" (e.g., crampy, dull, intermittent, constant)      no 6. ABDOMINAL PAIN SEVERITY: If present, ask: "How bad is the pain?"  (e.g., Scale 1-10; mild, moderate, or severe)   - MILD (1-3): doesn't interfere with normal activities, abdomen soft and not tender to touch    - MODERATE (4-7): interferes with normal activities or awakens from sleep, abdomen tender to touch    - SEVERE (8-10): excruciating pain, doubled over, unable to do any normal activities       NA 7. ORAL INTAKE: If vomiting, "Have you been able to drink liquids?" "How much liquids have you had in the past 24 hours?"     Yes 8. HYDRATION: "Any signs of dehydration?" (e.g., dry mouth [not just dry lips], too weak to stand, dizziness, new weight loss) "When did you last urinate?"     no 9. EXPOSURE: "Have you traveled to a foreign country recently?" "Have you been exposed to anyone with diarrhea?" "Could you have eaten any food that was  spoiled?"     *No Answer* 10. ANTIBIOTIC USE: "Are you taking antibiotics now or have you taken antibiotics in the past 2 months?"       no 11. OTHER SYMPTOMS: "Do you have any other symptoms?" (e.g., fever, blood in stool)    No  Protocols used: Diarrhea-A-AH

## 2021-06-01 NOTE — Telephone Encounter (Signed)
Pt returning call from Friday when NT attempted to reach.  Pt reports diarrhea since May. Reports watery, 7-8 times daily. Denies fever, no abdominal pain. States eating and drinking WNL, staying hydrated, no S/S dehydration. Has not been on any antibiotics. Denies dizziness, urinating WNL. Denies any visible blood in stools. Reports "Horrible tast when I burp." States stools are "[place] but sometimes clear, just like water." Pt has appt already scheduled for 06/09/21. Assured pt NT would route to practice for PCPs review. Advised ED for worsening symptoms.

## 2021-06-01 NOTE — Telephone Encounter (Signed)
Patient has appointment scheduled with Simona Huh 06/09/2021.

## 2021-06-04 ENCOUNTER — Other Ambulatory Visit: Payer: Self-pay

## 2021-06-04 MED ORDER — CARVEDILOL 6.25 MG PO TABS: 6.2500 mg | ORAL_TABLET | Freq: Two times a day (BID) | ORAL | 0 refills | Status: DC

## 2021-06-04 NOTE — Telephone Encounter (Signed)
Agree with advise to go to an UC or ED if diarrhea worsening or signs of dehydration. Should keep appointment on 06-09-21.

## 2021-06-09 ENCOUNTER — Other Ambulatory Visit: Payer: Self-pay

## 2021-06-09 ENCOUNTER — Encounter: Payer: Self-pay | Admitting: Family Medicine

## 2021-06-09 ENCOUNTER — Ambulatory Visit: Payer: Medicare PPO | Admitting: Family Medicine

## 2021-06-09 VITALS — BP 125/40 | HR 80 | Temp 98.3°F | Wt 201.0 lb

## 2021-06-09 DIAGNOSIS — N1832 Chronic kidney disease, stage 3b: Secondary | ICD-10-CM

## 2021-06-09 DIAGNOSIS — K529 Noninfective gastroenteritis and colitis, unspecified: Secondary | ICD-10-CM

## 2021-06-09 DIAGNOSIS — E119 Type 2 diabetes mellitus without complications: Secondary | ICD-10-CM | POA: Diagnosis not present

## 2021-06-09 NOTE — Progress Notes (Signed)
Acute Office Visit  Subjective:    Patient ID: Nicholas Nolan, male    DOB: 07/13/1951, 70 y.o.   MRN: [id number]  No chief complaint on file.   HPI Patient is a 70 year old male who presents today fr evaluation of diarrhea that has been bothering him for 2 months.  He states that it has been constant and unchanged.  Most of the time it is clear watery stool.  He denies any abdominal pain or blood in the stool.  No fevers. Patient states he is on probiotic and has tried Immodium and Pepto with no results.  He does admit to eating a lot of tomato sandwiches over the summer.  Past Medical History:  Diagnosis Date   Arthritis    hands   Back pain    leg pain   CAD (coronary artery disease)    a. 10/2019 Cor CTA: LM nl, LAD nl, LCX distal dzs w/ FFR 0.65-0.75. RCA nl-->Med Rx.   CKD (chronic kidney disease), stage III (HCC)    Claudication (HCC)    Diabetes mellitus    type 2   Diastolic dysfunction    a. 08/2019 Echo: EF 60-65%, mild LVH, impaired relaxation. Nl RV size/fxn. Nl LA szie.   History of urinary retention    Hyperlipidemia    Hypertension    a. 05/2019 renal u/s concerning for R RAS; b. 06/2019 Renal artery angio: no more than15% bilat RAS.   Neuropathy    feet   Primary cancer of right upper lobe of lung ([facility]) 2014   RUL Lobectomy   Wears dentures    full upper    Past Surgical History:  Procedure Laterality Date   BASAL CELL CARCINOMA EXCISION     COLONOSCOPY WITH PROPOFOL N/A 12/25/2019   Procedure: COLONOSCOPY WITH PROPOFOL;  Surgeon: Lin Landsman, MD;  Location: [facility];  Service: Endoscopy;  Laterality: N/A;  Diabetic - injectable and oral meds   LUMBAR LAMINECTOMY/DECOMPRESSION MICRODISCECTOMY  01/10/2012   Procedure: LUMBAR LAMINECTOMY/DECOMPRESSION MICRODISCECTOMY 2 LEVELS;  Surgeon: Hosie Spangle, MD;  Location: [facility];  Service: Neurosurgery;  Laterality: Bilateral;  Lumbar four-Sacral One laminectomies   LUNG SURGERY Right  11/22/12   right upper lobe   POLYPECTOMY N/A 12/25/2019   Procedure: POLYPECTOMY;  Surgeon: Lin Landsman, MD;  Location: [facility];  Service: Endoscopy;  Laterality: N/A;   PROSTATE SURGERY     biopsy-due to elelvated PSA   RENAL ANGIOGRAPHY Right 07/02/2019   Procedure: RENAL ANGIOGRAPHY;  Surgeon: Algernon Huxley, MD;  Location: [facility];  Service: Cardiovascular;  Laterality: Right;   SHOULDER SURGERY     VASECTOMY      Family History  Problem Relation Age of Onset   Cancer Father        prostate   Heart disease Father        MI   Stroke Father    Cancer Brother        liver    Social History   Socioeconomic History   Marital status: Married    Spouse name: Not on file   Number of children: 4   Years of education: Not on file   Highest education level: Associate degree: occupational, Hotel manager, or vocational program  Occupational History   Occupation: retired  Tobacco Use   Smoking status: Former    Packs/day: 2.00    Years: 45.00    Pack years: 90.00    Types:  Cigarettes    Quit date: 11/28/2012    Years since quitting: 8.5   Smokeless tobacco: Current    Types: Snuff   Tobacco comments:    Quit in 2014  Vaping Use   Vaping Use: Never used  Substance and Sexual Activity   Alcohol use: No   Drug use: No   Sexual activity: Yes    Birth control/protection: None  Other Topics Concern   Not on file  Social History Narrative   Not on file   Social Determinants of Health   Financial Resource Strain: Low Risk    Difficulty of Paying Living Expenses: Not hard at all  Food Insecurity: No Food Insecurity   Worried About Charity fundraiser in the Last Year: Never true   [place] in the Last Year: Never true  Transportation Needs: No Transportation Needs   Lack of Transportation (Medical): No   Lack of Transportation (Non-Medical): No  Physical Activity: Inactive   Days of Exercise per Week: 0 days   Minutes of Exercise per  Session: 0 min  Stress: No Stress Concern Present   Feeling of Stress : Not at all  Social Connections: Moderately Isolated   Frequency of Communication with Friends and Family: More than three times a week   Frequency of Social Gatherings with Friends and Family: More than three times a week   Attends Religious Services: Never   Marine scientist or Organizations: No   Attends Music therapist: Never   Marital Status: Married  Human resources officer Violence: Not At Risk   Fear of Current or Ex-Partner: No   Emotionally Abused: No   Physically Abused: No   Sexually Abused: No    Outpatient Medications Prior to Visit  Medication Sig Dispense Refill   amLODipine (NORVASC) 10 MG tablet TAKE 1 TABLET BY MOUTH EVERY DAY 90 tablet 0   aspirin EC 81 MG tablet Take 1 tablet (81 mg total) by mouth daily. Swallow whole. 90 tablet 3   atorvastatin (LIPITOR) 80 MG tablet TAKE 1 TABLET BY MOUTH EVERY DAY 90 tablet 3   carvedilol (COREG) 6.25 MG tablet Take 1 tablet (6.25 mg total) by mouth 2 (two) times daily. 180 tablet 0   cilostazol (PLETAL) 50 MG tablet TAKE 1 TABLET BY MOUTH TWICE A DAY 180 tablet 0   Dulaglutide 1.5 MG/0.5ML SOPN Inject 1.5 mg into the skin once a week.      FARXIGA 10 MG TABS tablet Take 10 mg by mouth every morning.     finasteride (PROSCAR) 5 MG tablet Take 1 tablet (5 mg total) by mouth daily. 30 tablet 11   glucose blood (ONETOUCH VERIO) test strip USE ONCE DAILY. USE AS INSTRUCTED.     olmesartan (BENICAR) 40 MG tablet Take 1 tablet (40 mg total) by mouth daily. 30 tablet 5   tamsulosin (FLOMAX) 0.4 MG CAPS capsule TAKE 1 CAPSULE (0.4 MG TOTAL) BY MOUTH DAILY AFTER SUPPER. 90 capsule 1   hydrALAZINE (APRESOLINE) 25 MG tablet Take 1 tablet (25 mg total) by mouth in the morning and at bedtime. 180 tablet 3   No facility-administered medications prior to visit.    Allergies  Allergen Reactions   Codeine Itching    And hyper also   Hydromorphone Itching    Morphine And Related Itching    Review of Systems  Gastrointestinal:  Positive for abdominal distention (bloating) and diarrhea. Negative for abdominal pain, anal bleeding, blood in stool, constipation,  nausea, rectal pain and vomiting.      Objective:    Physical Exam Constitutional:      General: He is not in acute distress.    Appearance: He is well-developed.  HENT:     Head: Normocephalic and atraumatic.     Right Ear: Hearing and tympanic membrane normal.     Left Ear: Hearing and tympanic membrane normal.     Nose: Nose normal.  Eyes:     General: Lids are normal. No scleral icterus.       Right eye: No discharge.        Left eye: No discharge.     Conjunctiva/sclera: Conjunctivae normal.  Cardiovascular:     Rate and Rhythm: Normal rate and regular rhythm.     Pulses: Normal pulses.     Heart sounds: Normal heart sounds.  Pulmonary:     Effort: Pulmonary effort is normal. No respiratory distress.     Breath sounds: Normal breath sounds.  Abdominal:     General: Bowel sounds are normal.     Palpations: Abdomen is soft.     Tenderness: There is no abdominal tenderness. There is no guarding or rebound.  Musculoskeletal:        General: Normal range of motion.     Cervical back: Neck supple.  Skin:    Findings: No lesion or rash.  Neurological:     Mental Status: He is alert and oriented to person, place, and time.  Psychiatric:        Speech: Speech normal.        Behavior: Behavior normal.        Thought Content: Thought content normal.    BP (!) 125/40 (BP Location: Right Arm, Patient Position: Sitting, Cuff Size: Normal)   Pulse 80   Temp 98.3 F (36.8 C) (Oral)   Wt 201 lb (91.2 kg)   SpO2 98%   BMI 28.84 kg/m  BP Readings from Last 3 Encounters:  06/09/21 (!) 125/40  05/18/21 (!) 143/58  02/16/21 (!) 160/60    Wt Readings from Last 3 Encounters:  06/09/21 201 lb (91.2 kg)  05/18/21 196 lb (88.9 kg)  02/16/21 200 lb (90.7 kg)    Health  Maintenance Due  Topic Date Due   COVID-19 Vaccine (1) Never done   OPHTHALMOLOGY EXAM  Never done   Zoster Vaccines- Shingrix (1 of 2) Never done   COLONOSCOPY (Pts 45-49yrs Insurance coverage will need to be confirmed)  12/24/2020   HEMOGLOBIN A1C  04/05/2021   INFLUENZA VACCINE  06/08/2021    There are no preventive care reminders to display for this patient.   Lab Results  Component Value Date   TSH 0.877 06/09/2020   Lab Results  Component Value Date   WBC 9.3 10/03/2019   HGB 10.5 (L) 10/03/2019   HCT 31.7 (L) 10/03/2019   MCV 89.8 10/03/2019   PLT 231 10/03/2019   Lab Results  Component Value Date   NA 139 02/08/2021   K 4.7 02/08/2021   CO2 21 (L) 02/08/2021   GLUCOSE 131 (H) 02/08/2021   BUN 46 (H) 02/08/2021   CREATININE 1.99 (H) 02/08/2021   BILITOT <0.2 10/19/2019   ALKPHOS 111 10/19/2019   AST 12 10/19/2019   ALT 10 10/19/2019   PROT 6.5 10/19/2019   ALBUMIN 4.3 10/19/2019   CALCIUM 9.2 02/08/2021   ANIONGAP 10 02/08/2021   Lab Results  Component Value Date   CHOL 126 08/21/2020   Lab Results  Component Value Date   HDL 48 08/21/2020   Lab Results  Component Value Date   LDLCALC 60 08/21/2020   Lab Results  Component Value Date   TRIG 89 08/21/2020   Lab Results  Component Value Date   CHOLHDL 2.6 08/21/2020   Lab Results  Component Value Date   HGBA1C 7.0 03/19/2020       Assessment & Plan:   1. Chronic diarrhea Having loose watery stool the past 4-6 weeks without fever, blood in stools or abdominal pains. States it started with the use of Finasteride. Dr. Bernardo Heater stopped this medication and no change in BM's. 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 Having gas with stools that starts with 5-6 movements 5-6 times each morning then 30 minutes after each meal (once). No relief from Pepto-Bismol, Probiotics, Imodium or Kaopectate. Will check labs with stool culture and O&P. May need Xifaxan or referral to gastroenterologist.  Must drink plenty of fluids and get extra fiber in diet. - CBC with Differential/Platelet - TSH - Cdiff NAA+O+P+Stool Culture  2. Type 2 diabetes mellitus without complication, without long-term current use of insulin (HCC) Followed by Dr. Gabriel Carina (endocrinologist) and Hgb A1C was 6.1% on 7-14.22.  3. Chronic kidney disease, stage 3b ([place]) Last creatinine was 2.0 with GFR 33 on 05-21-21. Followed by Dr. Lanora Manis (nephrology). - CBC with Differential/Platelet - TSH        Juluis Mire, CMA

## 2021-06-09 NOTE — Patient Instructions (Signed)
Chronic Diarrhea Chronic diarrhea is a condition in which a person passes frequent loose and watery stools for 4 weeks or longer. Non-chronic diarrhea usually lasts foronly 2-3 days. Diarrhea can cause a person to feel weak and dehydrated. Dehydration can make the person tired and thirsty. It can also cause a dry mouth, decreased urination, and dark yellow urine. Diarrhea is a sign of an underlying problem, such as: Infection. Side effects of medicines. Problems digesting something in your diet, such as milk products if you have lactose intolerance. Conditions such as celiac disease, irritable bowel syndrome (IBS), or inflammatory bowel disease (IBD). If you have chronic diarrhea, make sure you treat it as told by your healthcare provider. Follow these instructions at home: Medicines Take over-the-counter and prescription medicines only as told by your health care provider. If you were prescribed an antibiotic medicine, take it as told by your health care provider. Do not stop taking the antibiotic even if you start to feel better. Eating and drinking  Follow instructions from your health care provider about what to eat and drink. You may have to: Avoid foods that trigger diarrhea for you. Take an oral rehydration solution (ORS). This is a drink that keeps you hydrated. It can be found at pharmacies and retail stores. Drink clear fluids, such as water, diluted fruit juice, and low-calorie sports drinks. You can also get fluids by sucking on ice chips. Drink enough fluid to keep your urine pale yellow. This will help you avoid dehydration. Eat small amounts of bland foods that are easy to digest as you are able. These foods include bananas, applesauce, rice, lean meats, toast, and crackers. Avoid spicy or fatty foods. Avoid foods and beverages that contain a lot of sugar or caffeine. Do not drink alcohol if: Your health care provider tells you not to drink. You are pregnant, may be pregnant,  or are planning to become pregnant. If you drink alcohol: Limit how much you use to: 0-1 drink a day for women. 0-2 drinks a day for men. Be aware of how much alcohol is in your drink. In the [place] one drink equals one 12 oz bottle of beer (355 mL), one 5 oz glass of wine (148 mL), or one 1 oz glass of hard liquor (44 mL).  General instructions  Wash your hands often and after each diarrhea episode. Use soap and water. If soap and water are not available, use hand sanitizer. Make sure that all people in your household wash their hands well and often. Rest as told by your health care provider. Watch your condition for any changes. Take a warm bath to relieve any burning or pain from frequent diarrhea episodes. Keep all follow-up visits as told by your health care provider. This is important.  Contact a health care provider if: You have a fever. Your diarrhea gets worse or does not get better. You have new symptoms. You cannot drink fluid without vomiting. You feel light-headed or dizzy. You have a headache. You have muscle cramps. You have severe pain in the rectum. Get help right away if: You have vomiting that does not go away. You have chest pain. You feel very weak or you faint. You have bloody or black stools, or stools that look like tar. You have severe pain, cramping, or bloating in your abdomen, or pain that stays in one place. You have trouble breathing or you are breathing very quickly. Your heart is beating very quickly. Your skin feels cold and clammy. You  feel confused. You have a severe headache. You have signs or symptoms of dehydration, such as: Dark urine, very little urine, or no urine. Cracked lips. Dry mouth. Sunken eyes. Sleepiness. Weakness. These symptoms may represent a serious problem that is an emergency. Do not wait to see if the symptoms will go away. Get medical help right away. Call your local emergency services (911 in the [place] Do not drive  yourself to the hospital. Summary Chronic diarrhea is a condition in which a person passes frequent loose and watery stools for 4 weeks or longer. Diarrhea is a sign of an underlying problem. Make sure you treat your diarrhea as told by your health care provider. Drink enough fluid to keep your urine pale yellow. This will help you avoid dehydration. Wash your hands often and after each diarrhea episode. If soap and water are not available, use hand sanitizer. This information is not intended to replace advice given to you by your health care provider. Make sure you discuss any questions you have with your healthcare provider. Document Revised: 04/23/2019 Document Reviewed: 04/23/2019 Elsevier Patient Education  [place].

## 2021-06-10 LAB — TSH: TSH: 0.874 u[IU]/mL (ref 0.450–4.500)

## 2021-06-10 LAB — CBC WITH DIFFERENTIAL/PLATELET
Basophils Absolute: 0.1 10*3/uL (ref 0.0–0.2)
Basos: 2 %
EOS (ABSOLUTE): 0.2 10*3/uL (ref 0.0–0.4)
Eos: 5 %
Hematocrit: 31.8 % — ABNORMAL LOW (ref 37.5–51.0)
Hemoglobin: 10.4 g/dL — ABNORMAL LOW (ref 13.0–17.7)
Immature Grans (Abs): 0 10*3/uL (ref 0.0–0.1)
Immature Granulocytes: 0 %
Lymphocytes Absolute: 2 10*3/uL (ref 0.7–3.1)
Lymphs: 37 %
MCH: 29.1 pg (ref 26.6–33.0)
MCHC: 32.7 g/dL (ref 31.5–35.7)
MCV: 89 fL (ref 79–97)
Monocytes Absolute: 0.3 10*3/uL (ref 0.1–0.9)
Monocytes: 6 %
Neutrophils Absolute: 2.7 10*3/uL (ref 1.4–7.0)
Neutrophils: 50 %
Platelets: 206 10*3/uL (ref 150–450)
RBC: 3.57 x10E6/uL — ABNORMAL LOW (ref 4.14–5.80)
RDW: 13.1 % (ref 11.6–15.4)
WBC: 5.4 10*3/uL (ref 3.4–10.8)

## 2021-06-24 ENCOUNTER — Encounter: Payer: Self-pay | Admitting: Family Medicine

## 2021-07-03 ENCOUNTER — Ambulatory Visit: Payer: Medicare PPO | Admitting: Cardiology

## 2021-07-03 ENCOUNTER — Other Ambulatory Visit: Payer: Self-pay

## 2021-07-03 MED ORDER — AMLODIPINE BESYLATE 10 MG PO TABS: 10.0000 mg | ORAL_TABLET | Freq: Every day | ORAL | 3 refills | Status: DC

## 2021-07-04 LAB — CDIFF NAA+O+P+STOOL CULTURE
E coli, Shiga toxin Assay: NEGATIVE
Toxigenic C. Difficile by PCR: NEGATIVE

## 2021-07-15 ENCOUNTER — Telehealth: Payer: Self-pay

## 2021-07-15 NOTE — Telephone Encounter (Signed)
I don't see any documentation where someone called.   Thanks,   -Mickel Baas

## 2021-07-15 NOTE — Telephone Encounter (Signed)
Copied from [place] [id number]. Topic: General - Call Back - No Documentation >> Jul 15, 2021  1:57 PM Yvette Rack wrote: Reason for CRM: Pt stated he had a missed call so he returned the call. Cb# [phone]

## 2021-07-16 ENCOUNTER — Telehealth: Payer: Self-pay

## 2021-07-16 DIAGNOSIS — K529 Noninfective gastroenteritis and colitis, unspecified: Secondary | ICD-10-CM

## 2021-07-16 NOTE — Telephone Encounter (Signed)
Patient advised of labs results. Reports that he still is having the diarrhea but prefers to wait on the GI referral. Patient was also advised that an iron level needed. According to providers note he wanted this to be adde but I don't see if it was done. Per patient if still needed he come come in for the iron panel. Please advise.

## 2021-07-16 NOTE — Telephone Encounter (Signed)
Copied from [place] [id number]. Topic: General - Other >> Jul 16, 2021 11:14 AM Tessa Lerner A wrote: Reason for CRM: Patient would like to be contacted by a member of staff when possible about labs from 06/10/21  Please contact further when available

## 2021-07-16 NOTE — Telephone Encounter (Signed)
-----   Message from Margo Common, PA-C sent at 07/15/2021  1:04 PM EDT ----- Final report of stool cultures negative for infection. If still having diarrhea, need referral to a gastroenterologist for further evaluation.

## 2021-07-16 NOTE — Telephone Encounter (Signed)
Pt advised. He is still having issues with diarrhea and would like to be referred to GI.     Thanks,   -Mickel Baas

## 2021-07-16 NOTE — Telephone Encounter (Signed)
Hemoglobin and hematocrit low. Recommend check of OC-Light home kit to rule out loss of blood in stools and check serum iron and iron binding capacity to check for iron deficiency anemia. If having persistent diarrhea, really need a gastroenterology referral.

## 2021-07-17 ENCOUNTER — Encounter: Payer: Self-pay | Admitting: *Deleted

## 2021-07-27 NOTE — Progress Notes (Signed)
Cardiology Office Note    Date:  07/28/2021   ID:  Nicholas Nolan, DOB 09-Oct-1951, MRN [id number]  PCP:  Mar Daring, PA-C  Cardiologist:  Kate Sable, MD  Electrophysiologist:  None   Chief Complaint: Follow up  History of Present Illness:   Nicholas Nolan is a 70 y.o. male with history of nonobstructive CAD, DM, difficult to control hypertension, HLD, CKD stage III, PVD, PVCs, remote tobacco abuse, lung cancer status post partial lobectomy in 2016, peripheral neuropathy, sleep apnea intolerant to CPAP, and chronic back pain, who presents for follow-up of CAD and HTN.  Echo in 08/2019 showed an EF of 60-65%, mild LVH, diastolic dysfunction, normal RV systolic function and ventricular cavity size, mild aortic valve sclerosis without evidence of stenosis, and mild mitral regurgitation. Coronary CTA in 10/2019 showed a calcium score of 1689 with nonobstructive disease with normal left main, normal LAD, distal LCx stenosis with an FFR of 0.65-0.75, and a normal RCA. Medical management recommended.   He has been followed for difficult to control hypertension. Renal artery ultrasound in 05/2019, was concerning for right renal artery stenosis. Renal artery angiography in 06/2019, showed less than 20% stenosis in the bilateral renal arteries. No significant iliac disease was noted.   With regards to his PVD, he is followed by Dr. Fletcher Anon. He underwent noninvasive imaging in 08/2020 that showed an ABI of 0.69 on the right and 0.60 on the left.  Duplex showed mild to moderate iliac disease, severe right mid SFA stenosis with peak velocity of 600.  On the left, the mid SFA was occluded with reconstitution above the knee. Given his underlying renal dysfunction, medical therapy was recommended with angiography reserved as a last resort given risk of contrast-induced nephropathy. Carotid artery ultrasound in 01/2021, showed 40-59% bilateral ICA stenosis with known stenosis of the bilateral  subclavian arteries and antegrade flow of the bilateral vertebral arteries. He has been evaluated by vascular surgery with medical management recommended.   He was last seen in the office in 02/2021 for follow up with BP 160/60. He had been out of Benicar for the prior month. Benicar was restarted at 40 mg and he was continued on amlodipine, Coreg, and hydralazine.   He comes in doing well.  No chest pain, shortness of breath, palpitations, presyncope, or syncope.  He does note some dizziness if he stands too quickly.  He does try and stay hydrated.  He is tolerating cardiac medications without issues.  Blood pressure is typically in the 130s to 140s at home with occasional readings in the 150s.  He does continue to deal with chronic watery diarrhea and has an appointment with GI coming up.  No lower extremity swelling, falls, hematochezia, or melena.  No wounds.   Labs independently reviewed: 06/2021 - TSH normal, HGB 10.4, PLT 206 05/2021 - A1c 6.1. potassium 5.0, BUN 26, SCr 2.0 08/2020 - TC 126, TG 89, HDL 48, LDL 60 06/2020 - albumin 3.6, AST 41, ALT normal  Past Medical History:  Diagnosis Date   Arthritis    hands   Back pain    leg pain   CAD (coronary artery disease)    a. 10/2019 Cor CTA: LM nl, LAD nl, LCX distal dzs w/ FFR 0.65-0.75. RCA nl-->Med Rx.   CKD (chronic kidney disease), stage III (HCC)    Claudication (HCC)    Diabetes mellitus    type 2   Diastolic dysfunction    a. 08/2019 Echo: EF 60-65%,  mild LVH, impaired relaxation. Nl RV size/fxn. Nl LA szie.   History of urinary retention    Hyperlipidemia    Hypertension    a. 05/2019 renal u/s concerning for R RAS; b. 06/2019 Renal artery angio: no more than15% bilat RAS.   Neuropathy    feet   Primary cancer of right upper lobe of lung ([facility]) 2014   RUL Lobectomy   Wears dentures    full upper    Past Surgical History:  Procedure Laterality Date   BASAL CELL CARCINOMA EXCISION     COLONOSCOPY WITH PROPOFOL N/A  12/25/2019   Procedure: COLONOSCOPY WITH PROPOFOL;  Surgeon: Lin Landsman, MD;  Location: [facility];  Service: Endoscopy;  Laterality: N/A;  Diabetic - injectable and oral meds   LUMBAR LAMINECTOMY/DECOMPRESSION MICRODISCECTOMY  01/10/2012   Procedure: LUMBAR LAMINECTOMY/DECOMPRESSION MICRODISCECTOMY 2 LEVELS;  Surgeon: Hosie Spangle, MD;  Location: [facility];  Service: Neurosurgery;  Laterality: Bilateral;  Lumbar four-Sacral One laminectomies   LUNG SURGERY Right 11/22/12   right upper lobe   POLYPECTOMY N/A 12/25/2019   Procedure: POLYPECTOMY;  Surgeon: Lin Landsman, MD;  Location: [facility];  Service: Endoscopy;  Laterality: N/A;   PROSTATE SURGERY     biopsy-due to elelvated PSA   RENAL ANGIOGRAPHY Right 07/02/2019   Procedure: RENAL ANGIOGRAPHY;  Surgeon: Algernon Huxley, MD;  Location: [facility];  Service: Cardiovascular;  Laterality: Right;   SHOULDER SURGERY     VASECTOMY      Current Medications: Current Meds  Medication Sig   amLODipine (NORVASC) 10 MG tablet Take 1 tablet (10 mg total) by mouth daily.   aspirin EC 81 MG tablet Take 1 tablet (81 mg total) by mouth daily. Swallow whole.   atorvastatin (LIPITOR) 80 MG tablet TAKE 1 TABLET BY MOUTH EVERY DAY   carvedilol (COREG) 6.25 MG tablet Take 1 tablet (6.25 mg total) by mouth 2 (two) times daily.   cilostazol (PLETAL) 50 MG tablet TAKE 1 TABLET BY MOUTH TWICE A DAY   Dulaglutide 1.5 MG/0.5ML SOPN Inject 1.5 mg into the skin once a week.    FARXIGA 10 MG TABS tablet Take 10 mg by mouth every morning.   finasteride (PROSCAR) 5 MG tablet Take 1 tablet (5 mg total) by mouth daily.   glucose blood (ONETOUCH VERIO) test strip USE ONCE DAILY. USE AS INSTRUCTED.   hydrALAZINE (APRESOLINE) 25 MG tablet Take 1 tablet (25 mg total) by mouth in the morning and at bedtime.   olmesartan (BENICAR) 40 MG tablet Take 1 tablet (40 mg total) by mouth daily.   tamsulosin (FLOMAX) 0.4 MG CAPS capsule  TAKE 1 CAPSULE (0.4 MG TOTAL) BY MOUTH DAILY AFTER SUPPER.    Allergies:   Codeine, Hydromorphone, and Morphine and related   Social History   Socioeconomic History   Marital status: Married    Spouse name: Not on file   Number of children: 4   Years of education: Not on file   Highest education level: Associate degree: occupational, Hotel manager, or vocational program  Occupational History   Occupation: retired  Tobacco Use   Smoking status: Former    Packs/day: 2.00    Years: 45.00    Pack years: 90.00    Types: Cigarettes    Quit date: 11/28/2012    Years since quitting: 8.6   Smokeless tobacco: Current    Types: Snuff   Tobacco comments:    Quit in 2014  Vaping Use  Vaping Use: Never used  Substance and Sexual Activity   Alcohol use: No   Drug use: No   Sexual activity: Yes    Birth control/protection: None  Other Topics Concern   Not on file  Social History Narrative   Not on file   Social Determinants of Health   Financial Resource Strain: Low Risk    Difficulty of Paying Living Expenses: Not hard at all  Food Insecurity: No Food Insecurity   Worried About Charity fundraiser in the Last Year: Never true   [place] in the Last Year: Never true  Transportation Needs: No Transportation Needs   Lack of Transportation (Medical): No   Lack of Transportation (Non-Medical): No  Physical Activity: Inactive   Days of Exercise per Week: 0 days   Minutes of Exercise per Session: 0 min  Stress: No Stress Concern Present   Feeling of Stress : Not at all  Social Connections: Moderately Isolated   Frequency of Communication with Friends and Family: More than three times a week   Frequency of Social Gatherings with Friends and Family: More than three times a week   Attends Religious Services: Never   Marine scientist or Organizations: No   Attends Music therapist: Never   Marital Status: Married     Family History:  The patient's family  history includes Cancer in his brother and father; Heart disease in his father; Stroke in his father.  ROS:   Review of Systems  Constitutional:  Negative for chills, diaphoresis, fever, malaise/fatigue and weight loss.  HENT:  Negative for congestion.   Eyes:  Negative for discharge and redness.  Respiratory:  Negative for cough, sputum production, shortness of breath and wheezing.   Cardiovascular:  Negative for chest pain, palpitations, orthopnea, claudication, leg swelling and PND.  Gastrointestinal:  Positive for diarrhea. Negative for abdominal pain, blood in stool, constipation, heartburn, melena, nausea and vomiting.  Musculoskeletal:  Negative for falls and myalgias.  Skin:  Negative for rash.  Neurological:  Positive for dizziness. Negative for tingling, tremors, sensory change, speech change, focal weakness, loss of consciousness and weakness.       Positional dizziness  Endo/Heme/Allergies:  Does not bruise/bleed easily.  Psychiatric/Behavioral:  Negative for substance abuse. The patient is not nervous/anxious.     EKGs/Labs/Other Studies Reviewed:    Studies reviewed were summarized above. The additional studies were reviewed today:  Coronary CTA/FFR 10/2019:  1. Left Main:  No significant stenosis. 2. LAD: No significant stenosis. 3. LCX: No significant stenosis in the proximal segment. Significant stenosis in the distal segment with FFR range of 0.65 - 0.75 4. RCA: No significant stenosis.   IMPRESSION: 1. CT FFR analysis showed significant stenosis in the distal segment of the LCX. The distal segment of the LCX is a small vessel with <89mm diameter. 2.  Guideline directed medical therapy recommended.   Aorta: Normal size. Ascending and descending aortic calcifications noted. No dissection.   Aortic Valve:  Trileaflet.  No calcifications.   Coronary Arteries:  Normal coronary origin.  Right dominance.   RCA is a large dominant artery that gives rise to PDA  and PLA. There is mild diffuse non obstructive (<25%)calcific plaque throughout the RCA.   Left main is a large artery that gives rise to LAD and LCX arteries. Minimal calcifications noted in LM   LAD is a large vessel with diffuse calcified plaque throughout its course. There are mild calcified plaques in  the proximal and distal LAD. There is a long segment of heavily calcified plaque in the mid LAD around the origin of the second diagonal. Severe stenosis cannot be excluded due to blooming artifacts from calcium.   LCX is a large non-dominant artery that gives rise to one large OM1 branch. There is heavily calcified plaque in the mid segment. Severe stenosis cannot be ruled out due to calcium blooming artifacts.   Other findings:   Normal pulmonary vein drainage into the left atrium.   Normal left atrial appendage without a thrombus.   Normal size of the pulmonary artery.   IMPRESSION: 1. Coronary calcium score of 1689. This was 44 percentile for age and sex matched control.   2. Normal coronary origin with right dominance.   3. Severe calcific plaques in the mid LAD and RCA. Degree of stenosis cannot be determined due to calcium blooming artifacts.   Possible CAD-RADS 4, Severe stenosis. Cardiac catheterization or CT FFR is recommended. CT FFR will be submitted. Consider symptom-guided anti-ischemic pharmacotherapy as well as risk factor modification per guideline directed care. __________  2D echo 08/2019: 1. Left ventricular ejection fraction, by visual estimation, is 60 to  65%. The left ventricle has normal function. Normal left ventricular size.  There is mildly increased left ventricular hypertrophy.   2. Left ventricular diastolic Doppler parameters are consistent with  impaired relaxation pattern of LV diastolic filling.   3. Global right ventricle has normal systolic function.The right  ventricular size is normal. No increase in right ventricular wall   thickness.   4. Left atrial size was normal.   5. TR signal is inadequate for assessing pulmonary artery systolic  pressure.  __________  Lower extremity arterial ultrasound 08/2020: Right: Atherosclerosis in the common femoral, femoral, and popliteal and  tibial arteries.  Elevated velocities throughout the right lower extremity with a 75-99%  stenosis in the mid SFA.  Three vessel run off.   Left: Atherosclerosis in the common femoral, femoral, and popliteal and  tibial arteries.  Elevated velocities throughout the left lower extremity with a short  segment occlusion in the mid SFA which reconstitutes mid/distal SFA.  Three vessel runoff.    Elevated velocities with turbulent flow throughout the aorta and >50 %  stenosis in the iliac arteries and distal aorta.  __________  ABI's 08/2020: Right: Resting right ankle-brachial index indicates moderate right lower  extremity arterial disease. The right toe-brachial index is abnormal.   Left: Resting left ankle-brachial index indicates moderate left lower  extremity arterial disease. The left toe-brachial index is abnormal.  __________  Carotid artery ultrasound 01/2021: Right Carotid: Velocities in the right ICA are consistent with a 40-59% stenosis.   Left Carotid: Velocities in the left ICA are consistent with a 40-59%  stenosis. The ECA appears >50% stenosed.   Vertebrals:  Bilateral vertebral arteries demonstrate antegrade flow.  Subclavians: Bilateral subclavian arteries were stenotic.   Suggest follow up study in 12 months.     EKG:  EKG is ordered today.  The EKG ordered today demonstrates NSR, 66 bpm, underlying artifact, no acute ST-T changes  Recent Labs: 02/08/2021: BUN 46; Creatinine, Ser 1.99; Potassium 4.7; Sodium 139 06/09/2021: Hemoglobin 10.4; Platelets 206; TSH 0.874  Recent Lipid Panel    Component Value Date/Time   CHOL 126 08/21/2020 1246   CHOL 221 (H) 10/19/2019 1046   TRIG 89 08/21/2020 1246    HDL 48 08/21/2020 1246   HDL 43 10/19/2019 1046   CHOLHDL 2.6 08/21/2020  1246   VLDL 18 08/21/2020 1246   LDLCALC 60 08/21/2020 1246   LDLCALC 150 (H) 10/19/2019 1046    PHYSICAL EXAM:    VS:  BP (!) 130/50 (BP Location: Left Arm, Patient Position: Sitting, Cuff Size: Large)   Pulse 64   Ht 5\' 9"  (1.753 m)   Wt 194 lb (88 kg)   SpO2 97%   BMI 28.65 kg/m   BMI: Body mass index is 28.65 kg/m.  Physical Exam Vitals reviewed.  Constitutional:      Appearance: He is well-developed.  HENT:     Head: Normocephalic and atraumatic.  Eyes:     General:        Right eye: No discharge.        Left eye: No discharge.  Neck:     Vascular: No JVD.  Cardiovascular:     Rate and Rhythm: Normal rate and regular rhythm.     Pulses:          Carotid pulses are  on the right side with bruit and  on the left side with bruit.      Posterior tibial pulses are 2+ on the right side and 2+ on the left side.     Heart sounds: Normal heart sounds, S1 normal and S2 normal. Heart sounds not distant. No midsystolic click and no opening snap. No murmur heard.   No friction rub.  Pulmonary:     Effort: Pulmonary effort is normal. No respiratory distress.     Breath sounds: Normal breath sounds. No decreased breath sounds, wheezing or rales.  Chest:     Chest wall: No tenderness.  Abdominal:     General: There is no distension.     Palpations: Abdomen is soft.     Tenderness: There is no abdominal tenderness.  Musculoskeletal:     Cervical back: Normal range of motion.     Right lower leg: No edema.     Left lower leg: No edema.  Skin:    General: Skin is warm and dry.     Nails: There is no clubbing.  Neurological:     Mental Status: He is alert and oriented to person, place, and time.  Psychiatric:        Speech: Speech normal.        Behavior: Behavior normal.        Thought Content: Thought content normal.        Judgment: Judgment normal.    Wt Readings from Last 3 Encounters:   07/28/21 194 lb (88 kg)  06/09/21 201 lb (91.2 kg)  05/18/21 196 lb (88.9 kg)     ASSESSMENT & PLAN:   Difficult to control hypertension: Blood pressure is reasonably controlled in the office today.  Continue current medical therapy including amlodipine, carvedilol, hydralazine, and olmesartan.  Low-sodium diet recommended.  Continue adherence to CPAP.  Nonobstructive CAD: No symptoms concerning for angina.  Continue current medical therapy including aspirin, atorvastatin, amlodipine, carvedilol, and olmesartan.  Risk factor modification.  No indication for ischemic testing at this time.  HLD: LDL 60 in 08/2020.  He remains on atorvastatin.  Carotid artery disease: Followed by vascular surgery.  Continue current medical therapy including aspirin and statin.  Risk factor modification.  PAD: No symptoms of critical limb ischemia.  Continue medical management, including aspirin, cilostazol, and atorvastatin, under the direction of vascular surgery.  Aggressive risk factor modification.  Follow-up as planned.  OSA: Intolerant to CPAP.  Disposition: F/u with Dr.  Agbor-Etang or an APP in 6 months, sooner if needed.   Medication Adjustments/Labs and Tests Ordered: Current medicines are reviewed at length with the patient today.  Concerns regarding medicines are outlined above. Medication changes, Labs and Tests ordered today are summarized above and listed in the Patient Instructions accessible in Encounters.   Signed, Christell Faith, PA-C 07/28/2021 9:18 AM     [address] [place] [address] [place], [place] [id number] [phone]

## 2021-07-28 ENCOUNTER — Encounter: Payer: Self-pay | Admitting: Physician Assistant

## 2021-07-28 ENCOUNTER — Other Ambulatory Visit: Payer: Self-pay

## 2021-07-28 ENCOUNTER — Ambulatory Visit: Payer: Medicare PPO | Admitting: Physician Assistant

## 2021-07-28 VITALS — BP 130/50 | HR 64 | Ht 69.0 in | Wt 194.0 lb

## 2021-07-28 DIAGNOSIS — G4733 Obstructive sleep apnea (adult) (pediatric): Secondary | ICD-10-CM

## 2021-07-28 DIAGNOSIS — I10 Essential (primary) hypertension: Secondary | ICD-10-CM

## 2021-07-28 DIAGNOSIS — R0989 Other specified symptoms and signs involving the circulatory and respiratory systems: Secondary | ICD-10-CM

## 2021-07-28 DIAGNOSIS — E785 Hyperlipidemia, unspecified: Secondary | ICD-10-CM

## 2021-07-28 DIAGNOSIS — I739 Peripheral vascular disease, unspecified: Secondary | ICD-10-CM

## 2021-07-28 DIAGNOSIS — I2510 Atherosclerotic heart disease of native coronary artery without angina pectoris: Secondary | ICD-10-CM | POA: Diagnosis not present

## 2021-07-28 NOTE — Patient Instructions (Signed)
Medication Instructions:  No changes at this time.  *If you need a refill on your cardiac medications before your next appointment, please call your pharmacy*   Lab Work: None  If you have labs (blood work) drawn today and your tests are completely normal, you will receive your results only by: [place] (if you have MyChart) OR A paper copy in the mail If you have any lab test that is abnormal or we need to change your treatment, we will call you to review the results.   Testing/Procedures: None   Follow-Up: At [facility], you and your health needs are our priority.  As part of our continuing mission to provide you with exceptional heart care, we have created designated Provider Care Teams.  These Care Teams include your primary Cardiologist (physician) and Advanced Practice Providers (APPs -  Physician Assistants and Nurse Practitioners) who all work together to provide you with the care you need, when you need it.   Your next appointment:   6 month(s)  The format for your next appointment:   In Person  Provider:   Kate Sable, MD or Murray Hodgkins, NP

## 2021-07-30 DIAGNOSIS — K529 Noninfective gastroenteritis and colitis, unspecified: Secondary | ICD-10-CM | POA: Insufficient documentation

## 2021-08-10 ENCOUNTER — Ambulatory Visit (INDEPENDENT_AMBULATORY_CARE_PROVIDER_SITE_OTHER): Payer: Medicare PPO

## 2021-08-10 ENCOUNTER — Other Ambulatory Visit: Payer: Self-pay

## 2021-08-10 ENCOUNTER — Ambulatory Visit (INDEPENDENT_AMBULATORY_CARE_PROVIDER_SITE_OTHER): Payer: Medicare PPO | Admitting: Vascular Surgery

## 2021-08-10 ENCOUNTER — Encounter (INDEPENDENT_AMBULATORY_CARE_PROVIDER_SITE_OTHER): Payer: Self-pay | Admitting: Vascular Surgery

## 2021-08-10 VITALS — BP 137/62 | HR 80 | Resp 16 | Wt 202.0 lb

## 2021-08-10 DIAGNOSIS — E119 Type 2 diabetes mellitus without complications: Secondary | ICD-10-CM

## 2021-08-10 DIAGNOSIS — I70213 Atherosclerosis of native arteries of extremities with intermittent claudication, bilateral legs: Secondary | ICD-10-CM

## 2021-08-10 DIAGNOSIS — I10 Essential (primary) hypertension: Secondary | ICD-10-CM

## 2021-08-10 DIAGNOSIS — I6523 Occlusion and stenosis of bilateral carotid arteries: Secondary | ICD-10-CM | POA: Diagnosis not present

## 2021-08-10 DIAGNOSIS — E782 Mixed hyperlipidemia: Secondary | ICD-10-CM

## 2021-08-10 NOTE — Progress Notes (Signed)
MRN : [id number]  Nicholas Nolan is a 70 y.o. (1951-06-09) male who presents with chief complaint of check up.  History of Present Illness:   The patient is seen for evaluation of carotid stenosis. The carotid stenosis was identified after a bruit was auscultated.   The patient denies amaurosis fugax. There is no recent history of TIA symptoms or focal motor deficits. There is no prior documented CVA.   There is no history of migraine headaches. There is no history of seizures.   The patient is taking enteric-coated aspirin 81 mg daily.   The patient has a history of coronary artery disease, no recent episodes of angina or shortness of breath. The patient denies PAD or claudication symptoms. There is a history of hyperlipidemia which is being treated with a statin.    Duplex ultrasound of the bilateral carotid arteries demonstrates 40 to 59% stenosis bilateral internal carotid arteries.  No change compared to the previous study.  ABI Rt=0.63 and Lt=0.54 (previous ABI Rt=0.69 and Lt=0.60   Current Meds  Medication Sig   amLODipine (NORVASC) 10 MG tablet Take 1 tablet (10 mg total) by mouth daily.   aspirin EC 81 MG tablet Take 1 tablet (81 mg total) by mouth daily. Swallow whole.   atorvastatin (LIPITOR) 80 MG tablet TAKE 1 TABLET BY MOUTH EVERY DAY   carvedilol (COREG) 6.25 MG tablet Take 1 tablet (6.25 mg total) by mouth 2 (two) times daily.   cilostazol (PLETAL) 50 MG tablet TAKE 1 TABLET BY MOUTH TWICE A DAY   Dulaglutide 1.5 MG/0.5ML SOPN Inject 1.5 mg into the skin once a week.    FARXIGA 10 MG TABS tablet Take 10 mg by mouth every morning.   finasteride (PROSCAR) 5 MG tablet Take 1 tablet (5 mg total) by mouth daily.   glucose blood (ONETOUCH VERIO) test strip USE ONCE DAILY. USE AS INSTRUCTED.   hydrALAZINE (APRESOLINE) 25 MG tablet Take 1 tablet (25 mg total) by mouth in the morning and at bedtime.   olmesartan (BENICAR) 40 MG tablet Take 1 tablet (40 mg total) by  mouth daily.   tamsulosin (FLOMAX) 0.4 MG CAPS capsule TAKE 1 CAPSULE (0.4 MG TOTAL) BY MOUTH DAILY AFTER SUPPER.    Past Medical History:  Diagnosis Date   Arthritis    hands   Back pain    leg pain   CAD (coronary artery disease)    a. 10/2019 Cor CTA: LM nl, LAD nl, LCX distal dzs w/ FFR 0.65-0.75. RCA nl-->Med Rx.   CKD (chronic kidney disease), stage III (HCC)    Claudication (HCC)    Diabetes mellitus    type 2   Diastolic dysfunction    a. 08/2019 Echo: EF 60-65%, mild LVH, impaired relaxation. Nl RV size/fxn. Nl LA szie.   History of urinary retention    Hyperlipidemia    Hypertension    a. 05/2019 renal u/s concerning for R RAS; b. 06/2019 Renal artery angio: no more than15% bilat RAS.   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   RUL Lobectomy   Wears dentures    full upper    Past Surgical History:  Procedure Laterality Date   BASAL CELL CARCINOMA EXCISION     COLONOSCOPY WITH PROPOFOL N/A 12/25/2019   Procedure: COLONOSCOPY WITH PROPOFOL;  Surgeon: Lin Landsman, MD;  Location: [place];  Service: Endoscopy;  Laterality: N/A;  Diabetic - injectable and  oral meds   LUMBAR LAMINECTOMY/DECOMPRESSION MICRODISCECTOMY  01/10/2012   Procedure: LUMBAR LAMINECTOMY/DECOMPRESSION MICRODISCECTOMY 2 LEVELS;  Surgeon: Hosie Spangle, MD;  Location: [place] [facility];  Service: Neurosurgery;  Laterality: Bilateral;  Lumbar four-Sacral One laminectomies   LUNG SURGERY Right 11/22/12   right upper lobe   POLYPECTOMY N/A 12/25/2019   Procedure: POLYPECTOMY;  Surgeon: Lin Landsman, MD;  Location: [place];  Service: Endoscopy;  Laterality: N/A;   PROSTATE SURGERY     biopsy-due to elelvated PSA   RENAL ANGIOGRAPHY Right 07/02/2019   Procedure: RENAL ANGIOGRAPHY;  Surgeon: Algernon Huxley, MD;  Location: [place] [facility];  Service: Cardiovascular;  Laterality: Right;   SHOULDER SURGERY     VASECTOMY      Social  History Social History   Tobacco Use   Smoking status: Former    Packs/day: 2.00    Years: 45.00    Pack years: 90.00    Types: Cigarettes    Quit date: 11/28/2012    Years since quitting: 8.7   Smokeless tobacco: Current    Types: Snuff   Tobacco comments:    Quit in 2014  Vaping Use   Vaping Use: Never used  Substance Use Topics   Alcohol use: No   Drug use: No    Family History Family History  Problem Relation Age of Onset   Cancer Father        prostate   Heart disease Father        MI   Stroke Father    Cancer Brother        liver    Allergies  Allergen Reactions   Codeine Itching    And hyper also   Hydromorphone Itching   Morphine And Related Itching     REVIEW OF SYSTEMS (Negative unless checked)  Constitutional: [] Weight loss  [] Fever  [] Chills Cardiac: [] Chest pain   [] Chest pressure   [] Palpitations   [] Shortness of breath when laying flat   [] Shortness of breath with exertion. Vascular:  [x] Pain in legs with walking   [] Pain in legs at rest  [] History of DVT   [] Phlebitis   [] Swelling in legs   [] Varicose veins   [] Non-healing ulcers Pulmonary:   [] Uses home oxygen   [] Productive cough   [] Hemoptysis   [] Wheeze  [x] COPD   [] Asthma Neurologic:  [] Dizziness   [] Seizures   [] History of stroke   [] History of TIA  [] Aphasia   [] Vissual changes   [] Weakness or numbness in arm   [] Weakness or numbness in leg Musculoskeletal:   [] Joint swelling   [x] Joint pain   [] Low back pain Hematologic:  [] Easy bruising  [] Easy bleeding   [] Hypercoagulable state   [] Anemic Gastrointestinal:  [] Diarrhea   [] Vomiting  [] Gastroesophageal reflux/heartburn   [] Difficulty swallowing. Genitourinary:  [] Chronic kidney disease   [] Difficult urination  [] Frequent urination   [] Blood in urine Skin:  [] Rashes   [] Ulcers  Psychological:  [] History of anxiety   []  History of major depression.  Physical Examination  Vitals:   08/10/21 1400  BP: 137/62  Pulse: 80  Resp: 16   Weight: 202 lb (91.6 kg)   Body mass index is 29.83 kg/m. Gen: WD/WN, NAD Head: [place]/AT, No temporalis wasting.  Ear/Nose/Throat: Hearing grossly intact, nares w/o erythema or drainage Eyes: PER, EOMI, sclera nonicteric.  Neck: Supple, no masses.  No bruit or JVD.  Pulmonary:  Good air movement, no audible wheezing, no use of accessory muscles.  Cardiac: RRR, normal S1,  S2, no Murmurs. Vascular:   bilateral carotid bruit Vessel Right Left  Radial Palpable Palpable  Carotid Palpable Palpable  PT Not Palpable Not Palpable  DP Not Palpable Not Palpable  Gastrointestinal: soft, non-distended. No guarding/no peritoneal signs.  Musculoskeletal: M/S 5/5 throughout.  No visible deformity.  Neurologic: CN 2-12 intact. Pain and light touch intact in extremities.  Symmetrical.  Speech is fluent. Motor exam as listed above. Psychiatric: Judgment intact, Mood & affect appropriate for pt's clinical situation. Dermatologic: No rashes or ulcers noted.  No changes consistent with cellulitis.   CBC Lab Results  Component Value Date   WBC 5.4 06/09/2021   HGB 10.4 (L) 06/09/2021   HCT 31.8 (L) 06/09/2021   MCV 89 06/09/2021   PLT 206 06/09/2021    BMET    Component Value Date/Time   NA 139 02/08/2021 1221   NA 140 10/19/2019 1046   K 4.7 02/08/2021 1221   CL 108 02/08/2021 1221   CO2 21 (L) 02/08/2021 1221   GLUCOSE 131 (H) 02/08/2021 1221   BUN 46 (H) 02/08/2021 1221   BUN 30 (H) 10/19/2019 1046   CREATININE 1.99 (H) 02/08/2021 1221   CALCIUM 9.2 02/08/2021 1221   GFRNONAA 36 (L) 02/08/2021 1221   GFRAA 51 (L) 10/19/2019 1046   CrCl cannot be calculated (Patient's most recent lab result is older than the maximum 21 days allowed.).  COAG No results found for: INR, PROTIME  Radiology No results found.   Assessment/Plan 1. Bilateral carotid artery stenosis Recommend:  Given the patient's asymptomatic subcritical stenosis no further invasive testing or surgery at this  time.  Duplex ultrasound shows 40-59% stenosis bilaterally.  Continue antiplatelet therapy as prescribed Continue management of CAD, HTN and Hyperlipidemia Healthy heart diet,  encouraged exercise at least 4 times per week Follow up in 12 months with duplex ultrasound and physical exam   - VAS US CAROTID; Future  2. Atherosclerosis of native artery of both lower extremities with intermittent claudication (HCC)  Recommend:  The patient has evidence of atherosclerosis of the lower extremities with claudication.  The patient does not voice lifestyle limiting changes at this point in time.  Noninvasive studies do not suggest clinically significant change.  No invasive studies, angiography or surgery at this time The patient should continue walking and begin a more formal exercise program.  The patient should continue antiplatelet therapy and aggressive treatment of the lipid abnormalities  No changes in the patient's medications at this time  - VAS [place] ABI WITH/WO TBI; Future  3. Essential hypertension Continue antihypertensive medications as already ordered, these medications have been reviewed and there are no changes at this time.   4. Type 2 diabetes mellitus without complication, without long-term current use of insulin (HCC) Continue hypoglycemic medications as already ordered, these medications have been reviewed and there are no changes at this time.  Hgb A1C to be monitored as already arranged by primary service   5. Mixed hyperlipidemia Continue statin as ordered and reviewed, no changes at this time     Hortencia Pilar, MD  08/10/2021 2:22 PM

## 2021-08-21 ENCOUNTER — Other Ambulatory Visit: Payer: Self-pay | Admitting: *Deleted

## 2021-08-21 MED ORDER — OLMESARTAN MEDOXOMIL 40 MG PO TABS: 40.0000 mg | ORAL_TABLET | Freq: Every day | ORAL | 5 refills | Status: DC

## 2021-09-02 ENCOUNTER — Other Ambulatory Visit: Payer: Self-pay | Admitting: *Deleted

## 2021-09-02 MED ORDER — CARVEDILOL 6.25 MG PO TABS: 6.2500 mg | ORAL_TABLET | Freq: Two times a day (BID) | ORAL | 0 refills | Status: DC

## 2021-09-17 ENCOUNTER — Encounter: Payer: Self-pay | Admitting: Gastroenterology

## 2021-09-17 ENCOUNTER — Ambulatory Visit (INDEPENDENT_AMBULATORY_CARE_PROVIDER_SITE_OTHER): Payer: Medicare PPO | Admitting: Gastroenterology

## 2021-09-17 VITALS — BP 155/65 | HR 73 | Temp 97.6°F | Ht 69.0 in | Wt 195.0 lb

## 2021-09-17 DIAGNOSIS — Z86010 Personal history of colonic polyps: Secondary | ICD-10-CM

## 2021-09-17 DIAGNOSIS — K529 Noninfective gastroenteritis and colitis, unspecified: Secondary | ICD-10-CM

## 2021-09-17 MED ORDER — NA SULFATE-K SULFATE-MG SULF 17.5-3.13-1.6 GM/177ML PO SOLN: 1.0000 | Freq: Once | ORAL | 0 refills | Status: AC

## 2021-09-17 NOTE — Progress Notes (Signed)
[name] MD, MRCP(U.K) [address]  [place]  [place], [place] [address]  Main: [phone]  Fax: [phone]   Gastroenterology Consultation  Referring Provider:     Margo Common, PA-C Primary Care Physician:  Mar Daring, PA-C Primary Gastroenterologist:  Dr. [name]  Reason for Consultation:    Chronic Diarrhea         HPI:   DAYSEN GUNDRUM is a 70 y.o. y/o male referred for consultation & management  by  Mar Daring, PA-C.    He states that he is here to see me for chronic diarrhea going on since June of this year.  Has to 10-12 bowel movements per day nonbloody in nature very watery.  Denies any use any artificial sugars or sweeteners denies any NSAID use.  He recollects he started taking olmesartan just about 5 to 6 months back.  Some weight loss.  He is not on metformin. 12/25/2019: Colonoscopy : fair prep- 5 polyps resected 4/5 adenomas.  06/10/2021: Hb 10. 4 grams , cr 2.0   Past Medical History:  Diagnosis Date   Arthritis    hands   Back pain    leg pain   CAD (coronary artery disease)    a. 10/2019 Cor CTA: LM nl, LAD nl, LCX distal dzs w/ FFR 0.65-0.75. RCA nl-->Med Rx.   CKD (chronic kidney disease), stage III (HCC)    Claudication (HCC)    Diabetes mellitus    type 2   Diastolic dysfunction    a. 08/2019 Echo: EF 60-65%, mild LVH, impaired relaxation. Nl RV size/fxn. Nl LA szie.   History of urinary retention    Hyperlipidemia    Hypertension    a. 05/2019 renal u/s concerning for R RAS; b. 06/2019 Renal artery angio: no more than15% bilat RAS.   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   RUL Lobectomy   Wears dentures    full upper    Past Surgical History:  Procedure Laterality Date   BASAL CELL CARCINOMA EXCISION     COLONOSCOPY WITH PROPOFOL N/A 12/25/2019   Procedure: COLONOSCOPY WITH PROPOFOL;  Surgeon: Lin Landsman, MD;  Location: [place];  Service:  Endoscopy;  Laterality: N/A;  Diabetic - injectable and oral meds   LUMBAR LAMINECTOMY/DECOMPRESSION MICRODISCECTOMY  01/10/2012   Procedure: LUMBAR LAMINECTOMY/DECOMPRESSION MICRODISCECTOMY 2 LEVELS;  Surgeon: Hosie Spangle, MD;  Location: [place] [facility];  Service: Neurosurgery;  Laterality: Bilateral;  Lumbar four-Sacral One laminectomies   LUNG SURGERY Right 11/22/12   right upper lobe   POLYPECTOMY N/A 12/25/2019   Procedure: POLYPECTOMY;  Surgeon: Lin Landsman, MD;  Location: [facility];  Service: Endoscopy;  Laterality: N/A;   PROSTATE SURGERY     biopsy-due to elelvated PSA   RENAL ANGIOGRAPHY Right 07/02/2019   Procedure: RENAL ANGIOGRAPHY;  Surgeon: Algernon Huxley, MD;  Location: [place] [facility];  Service: Cardiovascular;  Laterality: Right;   SHOULDER SURGERY     VASECTOMY      Prior to Admission medications   Medication Sig Start Date End Date Taking? Authorizing Provider  amLODipine (NORVASC) 10 MG tablet Take 1 tablet (10 mg total) by mouth daily. 07/03/21   Kate Sable, MD  aspirin EC 81 MG tablet Take 1 tablet (81 mg total) by mouth daily. Swallow whole. 08/14/20   Kate Sable, MD  atorvastatin (LIPITOR) 80 MG tablet TAKE 1 TABLET  BY MOUTH EVERY DAY 01/15/21   Kate Sable, MD  carvedilol (COREG) 6.25 MG tablet Take 1 tablet (6.25 mg total) by mouth 2 (two) times daily. 09/02/21   Kate Sable, MD  cilostazol (PLETAL) 50 MG tablet TAKE 1 TABLET BY MOUTH TWICE A DAY 04/02/21   Kate Sable, MD  Dulaglutide 1.5 MG/0.5ML SOPN Inject 1.5 mg into the skin once a week.  12/17/15   [provider]  FARXIGA 10 MG TABS tablet Take 10 mg by mouth every morning. 08/31/19   [provider]  finasteride (PROSCAR) 5 MG tablet Take 1 tablet (5 mg total) by mouth daily. 02/19/21   Vaillancourt, Aldona Bar, PA-C  glucose blood (ONETOUCH VERIO) test strip USE ONCE DAILY. USE AS INSTRUCTED. 06/29/18   [provider]  hydrALAZINE  (APRESOLINE) 25 MG tablet Take 1 tablet (25 mg total) by mouth in the morning and at bedtime. 01/15/21   Kate Sable, MD  olmesartan (BENICAR) 40 MG tablet Take 1 tablet (40 mg total) by mouth daily. 08/21/21   Kate Sable, MD  tamsulosin (FLOMAX) 0.4 MG CAPS capsule TAKE 1 CAPSULE (0.4 MG TOTAL) BY MOUTH DAILY AFTER SUPPER. 04/15/21   Stoioff, Ronda Fairly, MD    Family History  Problem Relation Age of Onset   Cancer Father        prostate   Heart disease Father        MI   Stroke Father    Cancer Brother        liver     Social History   Tobacco Use   Smoking status: Former    Packs/day: 2.00    Years: 45.00    Pack years: 90.00    Types: Cigarettes    Quit date: 11/28/2012    Years since quitting: 8.8   Smokeless tobacco: Current    Types: Snuff   Tobacco comments:    Quit in 2014  Vaping Use   Vaping Use: Never used  Substance Use Topics   Alcohol use: No   Drug use: No    Allergies as of 09/17/2021 - Review Complete 08/10/2021  Allergen Reaction Noted   Codeine Itching 01/10/2012   Hydromorphone Itching 05/07/2016   Morphine and related Itching 05/07/2016    Review of Systems:    All systems reviewed and negative except where noted in HPI.   Physical Exam:  There were no vitals taken for this visit. No LMP for male patient. Psych:  Alert and cooperative. Normal mood and affect. General:   Alert,  Well-developed, well-nourished, pleasant and cooperative in NAD Head:  Normocephalic and atraumatic. Eyes:  Sclera clear, no icterus.   Conjunctiva pink. Ears:  Normal auditory acuity. Nose:  No deformity, discharge, or lesions. Mouth:  No deformity or lesions,oropharynx pink & moist. Neck:  Supple; no masses or thyromegaly. Lungs:  Respirations even and unlabored.  Clear throughout to auscultation.   No wheezes, crackles, or rhonchi. No acute distress. Heart:  Regular rate and rhythm; no murmurs, clicks, rubs, or gallops. Abdomen:  Normal bowel sounds.   No bruits.  Soft, non-tender and non-distended without masses, hepatosplenomegaly or hernias noted.  No guarding or rebound tenderness.    Neurologic:  Alert and oriented x3;  grossly normal neurologically. Psych:  Alert and cooperative. Normal mood and affect.  Imaging Studies: No results found.  Assessment and Plan:   Nicholas Nolan is a 70 y.o. y/o male has been referred for chronic diarrhea going on for 4 to 5 months.  Nonbloody.  Colonoscopy over a year back grossly appeared normal but was a poor prep and hence polyps may have been missed.  He was recently started on a medication called olmesartan which has a strong correlation with microscopic/lymphocytic colitis.   Plan 1.  Stool test to rule out infection, celiac serology 2.  I will get in touch with Cher Nakai PA-C, Simona Huh Chrismon PA-C to inquire if they can hold olmesartan for a few months to see if diarrhea gets better.  I will proceed with colonoscopy for colon cancer screening as her prior prep was inadequate and also take biopsies to rule out microscopic or lymphocytic colitis.  I have discussed alternative options, risks & benefits,  which include, but are not limited to, bleeding, infection, perforation,respiratory complication & drug reaction.  The patient agrees with this plan & written consent will be obtained.     Follow up in 6-8 weeks   Dr [name] MD,MRCP(U.K)

## 2021-09-17 NOTE — Patient Instructions (Signed)
Go to [employer] drawing station inside for labs [place], [place], [place] [address]

## 2021-09-17 NOTE — Addendum Note (Signed)
Addended by: Lurlean Nanny on: 09/17/2021 03:37 PM   Modules accepted: Orders, SmartSet

## 2021-09-17 NOTE — Addendum Note (Signed)
Addended by: Lurlean Nanny on: 09/17/2021 01:51 PM   Modules accepted: Orders

## 2021-09-17 NOTE — Addendum Note (Signed)
Addended by: [name] on: 09/17/2021 01:33 PM   Modules accepted: Orders

## 2021-09-22 LAB — GI PROFILE, STOOL, PCR

## 2021-09-22 LAB — CELIAC DISEASE AB SCREEN W/RFX
Antigliadin Abs, IgA: 3 units (ref 0–19)
IgA/Immunoglobulin A, Serum: 131 mg/dL (ref 61–437)
Transglutaminase IgA: <2 U/mL (ref 0–3)

## 2021-09-22 LAB — C DIFFICILE, CYTOTOXIN B

## 2021-09-22 LAB — CALPROTECTIN, FECAL: Calprotectin, Fecal: 47 ug/g (ref 0–120)

## 2021-09-22 LAB — C DIFFICILE TOXINS A+B W/RFLX: C difficile Toxins A+B, EIA: NEGATIVE

## 2021-10-06 ENCOUNTER — Other Ambulatory Visit: Payer: Self-pay | Admitting: *Deleted

## 2021-10-06 MED ORDER — CILOSTAZOL 50 MG PO TABS: 50.0000 mg | ORAL_TABLET | Freq: Two times a day (BID) | ORAL | 0 refills | Status: DC

## 2021-10-08 ENCOUNTER — Ambulatory Visit
Admission: RE | Admit: 2021-10-08 | Discharge: 2021-10-08 | Disposition: A | Discharge status: Home / Self Care | Payer: Medicare PPO | Attending: Gastroenterology | Admitting: Gastroenterology

## 2021-10-08 ENCOUNTER — Ambulatory Visit: Payer: Medicare PPO | Admitting: Anesthesiology

## 2021-10-08 ENCOUNTER — Other Ambulatory Visit: Payer: Self-pay

## 2021-10-08 ENCOUNTER — Other Ambulatory Visit: Payer: Self-pay | Admitting: Urology

## 2021-10-08 ENCOUNTER — Encounter
Admission: RE | Disposition: A | Discharge status: Home / Self Care | Payer: Self-pay | Source: Home / Self Care | Attending: Gastroenterology

## 2021-10-08 ENCOUNTER — Encounter: Payer: Self-pay | Admitting: Gastroenterology

## 2021-10-08 DIAGNOSIS — D122 Benign neoplasm of ascending colon: Secondary | ICD-10-CM | POA: Insufficient documentation

## 2021-10-08 DIAGNOSIS — E1140 Type 2 diabetes mellitus with diabetic neuropathy, unspecified: Secondary | ICD-10-CM | POA: Diagnosis not present

## 2021-10-08 DIAGNOSIS — N184 Chronic kidney disease, stage 4 (severe): Secondary | ICD-10-CM | POA: Diagnosis not present

## 2021-10-08 DIAGNOSIS — D125 Benign neoplasm of sigmoid colon: Secondary | ICD-10-CM | POA: Diagnosis not present

## 2021-10-08 DIAGNOSIS — D124 Benign neoplasm of descending colon: Secondary | ICD-10-CM | POA: Diagnosis not present

## 2021-10-08 DIAGNOSIS — E1165 Type 2 diabetes mellitus with hyperglycemia: Secondary | ICD-10-CM | POA: Diagnosis not present

## 2021-10-08 DIAGNOSIS — E1122 Type 2 diabetes mellitus with diabetic chronic kidney disease: Secondary | ICD-10-CM | POA: Insufficient documentation

## 2021-10-08 DIAGNOSIS — Z85118 Personal history of other malignant neoplasm of bronchus and lung: Secondary | ICD-10-CM | POA: Insufficient documentation

## 2021-10-08 DIAGNOSIS — K529 Noninfective gastroenteritis and colitis, unspecified: Secondary | ICD-10-CM

## 2021-10-08 DIAGNOSIS — E1151 Type 2 diabetes mellitus with diabetic peripheral angiopathy without gangrene: Secondary | ICD-10-CM | POA: Insufficient documentation

## 2021-10-08 DIAGNOSIS — I129 Hypertensive chronic kidney disease with stage 1 through stage 4 chronic kidney disease, or unspecified chronic kidney disease: Secondary | ICD-10-CM | POA: Insufficient documentation

## 2021-10-08 DIAGNOSIS — Z1211 Encounter for screening for malignant neoplasm of colon: Secondary | ICD-10-CM | POA: Diagnosis not present

## 2021-10-08 DIAGNOSIS — D126 Benign neoplasm of colon, unspecified: Secondary | ICD-10-CM

## 2021-10-08 DIAGNOSIS — Z86010 Personal history of colonic polyps: Secondary | ICD-10-CM | POA: Diagnosis not present

## 2021-10-08 DIAGNOSIS — Z87891 Personal history of nicotine dependence: Secondary | ICD-10-CM | POA: Insufficient documentation

## 2021-10-08 HISTORY — PX: COLONOSCOPY WITH PROPOFOL: SHX5780

## 2021-10-08 LAB — GLUCOSE, CAPILLARY
Glucose-Capillary: 55 mg/dL — ABNORMAL LOW (ref 70–99)
Glucose-Capillary: 152 mg/dL — ABNORMAL HIGH (ref 70–99)

## 2021-10-08 SURGERY — COLONOSCOPY WITH PROPOFOL
Anesthesia: General

## 2021-10-08 MED ORDER — PROPOFOL 500 MG/50ML IV EMUL
INTRAVENOUS | Status: DC | PRN
  Administered 2021-10-08: 125 ug/kg/min via INTRAVENOUS

## 2021-10-08 MED ORDER — LIDOCAINE HCL (CARDIAC) PF 100 MG/5ML IV SOSY
PREFILLED_SYRINGE | INTRAVENOUS | Status: DC | PRN
  Administered 2021-10-08: 50 mg via INTRAVENOUS

## 2021-10-08 MED ORDER — DEXTROSE 50 % IV SOLN
INTRAVENOUS | Status: AC
  Filled 2021-10-08: qty 50

## 2021-10-08 MED ORDER — SODIUM CHLORIDE 0.9 % IV SOLN: INTRAVENOUS | Status: DC

## 2021-10-08 MED ORDER — PROPOFOL 10 MG/ML IV BOLUS
INTRAVENOUS | Status: DC | PRN
  Administered 2021-10-08: 40 mg via INTRAVENOUS

## 2021-10-08 MED ORDER — DEXTROSE 50 % IV SOLN
1.0000 | Freq: Once | INTRAVENOUS | Status: AC
  Administered 2021-10-08: 50 mL via INTRAVENOUS

## 2021-10-08 NOTE — Anesthesia Postprocedure Evaluation (Signed)
Anesthesia Post Note  Patient: Nicholas Nolan  Procedure(s) Performed: COLONOSCOPY WITH PROPOFOL  Patient location during evaluation: PACU Anesthesia Type: General Level of consciousness: awake and alert, oriented and patient cooperative Pain management: pain level controlled Vital Signs Assessment: post-procedure vital signs reviewed and stable Respiratory status: spontaneous breathing, nonlabored ventilation and respiratory function stable Cardiovascular status: blood pressure returned to baseline and stable Postop Assessment: adequate PO intake Anesthetic complications: no   No notable events documented.   Last Vitals:  Vitals:   10/08/21 0848 10/08/21 1000  BP: (!) 186/69 (!) 116/52  Pulse: 69   Resp: 16   Temp: 36.6 C (!) 36.1 C  SpO2: 100% 98%    Last Pain:  Vitals:   10/08/21 1000  TempSrc: Temporal  PainSc: Asleep                 Darrin Nipper

## 2021-10-08 NOTE — Transfer of Care (Signed)
Immediate Anesthesia Transfer of Care Note  Patient: Nicholas Nolan  Procedure(s) Performed: COLONOSCOPY WITH PROPOFOL  Patient Location: PACU and Endoscopy Unit  Anesthesia Type:General  Level of Consciousness: drowsy and patient cooperative  Airway & Oxygen Therapy: Patient Spontanous Breathing  Post-op Assessment: Report given to RN and Post -op Vital signs reviewed and stable  Post vital signs: Reviewed and stable  Last Vitals:  Vitals Value Taken Time  BP 116/52 10/08/21 1001  Temp 36.1 C 10/08/21 1000  Pulse 70 10/08/21 1001  Resp 8 10/08/21 1001  SpO2 98 % 10/08/21 1001  Vitals shown include unvalidated device data.  Last Pain:  Vitals:   10/08/21 1000  TempSrc: Temporal  PainSc: Asleep         Complications: No notable events documented.

## 2021-10-08 NOTE — Op Note (Signed)
[facility] Gastroenterology Patient Name: Nicholas Nolan Procedure Date: 10/08/2021 9:24 AM MRN: [id number] Account #: [id number] Date of Birth: February 26, 1951 Admit Type: Outpatient Age: 70 Room: [facility] ENDO ROOM 4 Gender: Male Note Status: Finalized Instrument Name: Jasper Riling [id number] Procedure:             Colonoscopy Indications:           Surveillance: History of adenomatous polyps,                         inadequate prep on last exam (<75yr), Last colonoscopy:                         February 2021 Providers:             [name] MD, MD Referring MD:          Mar Daring (Referring MD) Medicines:             Monitored Anesthesia Care Complications:         No immediate complications. Procedure:             Pre-Anesthesia Assessment:                        - Prior to the procedure, a History and Physical was                         performed, and patient medications, allergies and                         sensitivities were reviewed. The patient's tolerance                         of previous anesthesia was reviewed.                        - The risks and benefits of the procedure and the                         sedation options and risks were discussed with the                         patient. All questions were answered and informed                         consent was obtained.                        - ASA Grade Assessment: II - A patient with mild                         systemic disease.                        - Prior to the procedure, a History and Physical was                         performed, and patient medications, allergies and                         sensitivities were reviewed.  The patient's tolerance                         of previous anesthesia was reviewed.                        - The risks and benefits of the procedure and the                         sedation options and risks were discussed with the                         patient.  All questions were answered and informed                         consent was obtained.                        After obtaining informed consent, the colonoscope was                         passed under direct vision. Throughout the procedure,                         the patient's blood pressure, pulse, and oxygen                         saturations were monitored continuously. The                         Colonoscope was introduced through the anus and                         advanced to the the cecum, identified by the                         appendiceal orifice. Findings:      The perianal and digital rectal examinations were normal.      Two sessile polyps were found in the descending colon. The polyps were 4       to 5 mm in size. These polyps were removed with a cold snare. Resection       and retrieval were complete.      Two sessile polyps were found in the sigmoid colon and ascending colon.       The polyps were 4 to 5 mm in size. These polyps were removed with a cold       snare. Resection and retrieval were complete.      The exam was otherwise without abnormality on direct and retroflexion       views. Impression:            - Two 4 to 5 mm polyps in the descending colon,                         removed with a cold snare. Resected and retrieved.                        - Two 4 to 5 mm polyps in the sigmoid colon and in the  ascending colon, removed with a cold snare. Resected                         and retrieved.                        - The examination was otherwise normal on direct and                         retroflexion views. Recommendation:        - Discharge patient to home (with escort).                        - Resume previous diet.                        - Continue present medications.                        - Await pathology results.                        - Repeat colonoscopy in 3 years for surveillance. Procedure Code(s):     ---  Professional ---                        [phone], Colonoscopy, flexible; with removal of                         tumor(s), polyp(s), or other lesion(s) by snare                         technique Diagnosis Code(s):     --- Professional ---                        Z86.010, Personal history of colonic polyps                        K63.5, Polyp of colon CPT copyright 2019 American Medical Association. All rights reserved. The codes documented in this report are preliminary and upon coder review may  be revised to meet current compliance requirements. [name], MD [name] MD, MD 10/08/2021 10:01:29 AM This report has been signed electronically. Number of Addenda: 0 Note Initiated On: 10/08/2021 9:24 AM Scope Withdrawal Time: 0 hours 10 minutes 30 seconds  Total Procedure Duration: 0 hours 15 minutes 16 seconds  Estimated Blood Loss:  Estimated blood loss: none.      [facility]

## 2021-10-08 NOTE — Anesthesia Preprocedure Evaluation (Signed)
Anesthesia Evaluation  Patient identified by MRN, date of birth, ID band Patient awake    Reviewed: Allergy & Precautions, NPO status , Patient's Chart, lab work & pertinent test results  History of Anesthesia Complications Negative for: history of anesthetic complications  Airway Mallampati: III   Neck ROM: Full    Dental  (+) Edentulous Upper   Pulmonary former smoker (quit 2014),  Lung CA s/p RUL lobectomy   breath sounds clear to auscultation + decreased breath sounds      Cardiovascular hypertension, + Peripheral Vascular Disease (carotid stenosis)  Normal cardiovascular exam Rhythm:Regular Rate:Normal  ECG 07/28/21: normal   Neuro/Psych  Neuromuscular disease (neuropathy)    GI/Hepatic negative GI ROS,   Endo/Other  diabetes, Type 2  Renal/GU Renal disease (stage IV CKD)   BPH    Musculoskeletal  (+) Arthritis ,   Abdominal   Peds  Hematology negative hematology ROS (+)   Anesthesia Other Findings   Reproductive/Obstetrics                             Anesthesia Physical Anesthesia Plan  ASA: 3  Anesthesia Plan: General   Post-op Pain Management:    Induction: Intravenous  PONV Risk Score and Plan: 2 and Propofol infusion, TIVA and Treatment may vary due to age or medical condition  Airway Management Planned: Natural Airway  Additional Equipment:   Intra-op Plan:   Post-operative Plan:   Informed Consent: I have reviewed the patients History and Physical, chart, labs and discussed the procedure including the risks, benefits and alternatives for the proposed anesthesia with the patient or authorized representative who has indicated his/her understanding and acceptance.       Plan Discussed with: CRNA  Anesthesia Plan Comments: (LMA/GETA backup discussed.  Patient consented for risks of anesthesia including but not limited to:  - adverse reactions to  medications - damage to eyes, teeth, lips or other oral mucosa - nerve damage due to positioning  - sore throat or hoarseness - damage to heart, brain, nerves, lungs, other parts of body or loss of life  Informed patient about role of CRNA in peri- and intra-operative care.  Patient voiced understanding.)        Anesthesia Quick Evaluation

## 2021-10-08 NOTE — Anesthesia Procedure Notes (Signed)
Procedure Name: MAC Date/Time: 10/08/2021 9:38 AM Performed by: Jerrye Noble, CRNA Pre-anesthesia Checklist: Patient identified, Emergency Drugs available, Suction available and Patient being monitored Patient Re-evaluated:Patient Re-evaluated prior to induction Oxygen Delivery Method: Nasal cannula

## 2021-10-08 NOTE — H&P (Signed)
Nicholas Bellows, MD [address], [place], [place], [place], [address] [address], [place], [place], [place], [address] Phone: [phone]  Fax: [phone]  Primary Care Physician:  Mar Daring, PA-C   Pre-Procedure History & Physical: HPI:  Nicholas Nolan is a 70 y.o. male is here for an colonoscopy.   Past Medical History:  Diagnosis Date   Arthritis    hands   Back pain    leg pain   CAD (coronary artery disease)    a. 10/2019 Cor CTA: LM nl, LAD nl, LCX distal dzs w/ FFR 0.65-0.75. RCA nl-->Med Rx.   CKD (chronic kidney disease), stage III (HCC)    Claudication (HCC)    Diabetes mellitus    type 2   Diastolic dysfunction    a. 08/2019 Echo: EF 60-65%, mild LVH, impaired relaxation. Nl RV size/fxn. Nl LA szie.   History of urinary retention    Hyperlipidemia    Hypertension    a. 05/2019 renal u/s concerning for R RAS; b. 06/2019 Renal artery angio: no more than15% bilat RAS.   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   RUL Lobectomy   Wears dentures    full upper    Past Surgical History:  Procedure Laterality Date   BASAL CELL CARCINOMA EXCISION     COLONOSCOPY WITH PROPOFOL N/A 12/25/2019   Procedure: COLONOSCOPY WITH PROPOFOL;  Surgeon: Lin Landsman, MD;  Location: [facility];  Service: Endoscopy;  Laterality: N/A;  Diabetic - injectable and oral meds   LUMBAR LAMINECTOMY/DECOMPRESSION MICRODISCECTOMY  01/10/2012   Procedure: LUMBAR LAMINECTOMY/DECOMPRESSION MICRODISCECTOMY 2 LEVELS;  Surgeon: Hosie Spangle, MD;  Location: [facility];  Service: Neurosurgery;  Laterality: Bilateral;  Lumbar four-Sacral One laminectomies   LUNG SURGERY Right 11/22/12   right upper lobe   POLYPECTOMY N/A 12/25/2019   Procedure: POLYPECTOMY;  Surgeon: Lin Landsman, MD;  Location: [facility];  Service: Endoscopy;  Laterality: N/A;   PROSTATE SURGERY     biopsy-due to elelvated PSA   RENAL  ANGIOGRAPHY Right 07/02/2019   Procedure: RENAL ANGIOGRAPHY;  Surgeon: Algernon Huxley, MD;  Location: [facility];  Service: Cardiovascular;  Laterality: Right;   SHOULDER SURGERY     VASECTOMY      Prior to Admission medications   Medication Sig Start Date End Date Taking? Authorizing Provider  amLODipine (NORVASC) 10 MG tablet Take 1 tablet (10 mg total) by mouth daily. 07/03/21  Yes Kate Sable, MD  aspirin EC 81 MG tablet Take 1 tablet (81 mg total) by mouth daily. Swallow whole. 08/14/20  Yes Agbor-Etang, Aaron Edelman, MD  atorvastatin (LIPITOR) 80 MG tablet TAKE 1 TABLET BY MOUTH EVERY DAY 01/15/21  Yes Agbor-Etang, Aaron Edelman, MD  carvedilol (COREG) 6.25 MG tablet Take 1 tablet (6.25 mg total) by mouth 2 (two) times daily. 09/02/21  Yes Kate Sable, MD  cilostazol (PLETAL) 50 MG tablet Take 1 tablet (50 mg total) by mouth 2 (two) times daily. 10/06/21  Yes Agbor-Etang, Aaron Edelman, MD  FARXIGA 10 MG TABS tablet Take 10 mg by mouth every morning. 08/31/19  Yes [provider]  finasteride (PROSCAR) 5 MG tablet Take 1 tablet (5 mg total) by mouth daily. 02/19/21  Yes Vaillancourt, Samantha, PA-C  hydrALAZINE (APRESOLINE) 25 MG tablet Take 1 tablet (25 mg total) by mouth in the morning and at bedtime. 01/15/21  Yes Kate Sable, MD  olmesartan (  BENICAR) 40 MG tablet Take 1 tablet (40 mg total) by mouth daily. 08/21/21  Yes Agbor-Etang, Aaron Edelman, MD  tamsulosin (FLOMAX) 0.4 MG CAPS capsule TAKE 1 CAPSULE (0.4 MG TOTAL) BY MOUTH DAILY AFTER SUPPER. 10/08/21  Yes Stoioff, Ronda Fairly, MD  Dulaglutide 1.5 MG/0.5ML SOPN Inject 1.5 mg into the skin once a week.  12/17/15   [provider]  glucose blood (ONETOUCH VERIO) test strip USE ONCE DAILY. USE AS INSTRUCTED. 06/29/18   [provider]    Allergies as of 09/18/2021 - Review Complete 09/17/2021  Allergen Reaction Noted   Codeine Itching 01/10/2012   Hydromorphone Itching 05/07/2016   Morphine and related Itching  05/07/2016    Family History  Problem Relation Age of Onset   Cancer Father        prostate   Heart disease Father        MI   Stroke Father    Cancer Brother        liver    Social History   Socioeconomic History   Marital status: Married    Spouse name: Not on file   Number of children: 4   Years of education: Not on file   Highest education level: Associate degree: occupational, Hotel manager, or vocational program  Occupational History   Occupation: retired  Tobacco Use   Smoking status: Former    Packs/day: 2.00    Years: 45.00    Pack years: 90.00    Types: Cigarettes    Quit date: 11/28/2012    Years since quitting: 8.8   Smokeless tobacco: Current    Types: Snuff   Tobacco comments:    Quit in 2014  Vaping Use   Vaping Use: Never used  Substance and Sexual Activity   Alcohol use: No   Drug use: No   Sexual activity: Yes    Birth control/protection: None  Other Topics Concern   Not on file  Social History Narrative   Not on file   Social Determinants of Health   Financial Resource Strain: Low Risk    Difficulty of Paying Living Expenses: Not hard at all  Food Insecurity: No Food Insecurity   Worried About Charity fundraiser in the Last Year: Never true   [place] in the Last Year: Never true  Transportation Needs: No Transportation Needs   Lack of Transportation (Medical): No   Lack of Transportation (Non-Medical): No  Physical Activity: Inactive   Days of Exercise per Week: 0 days   Minutes of Exercise per Session: 0 min  Stress: No Stress Concern Present   Feeling of Stress : Not at all  Social Connections: Moderately Isolated   Frequency of Communication with Friends and Family: More than three times a week   Frequency of Social Gatherings with Friends and Family: More than three times a week   Attends Religious Services: Never   Marine scientist or Organizations: No   Attends Music therapist: Never   Marital  Status: Married  Human resources officer Violence: Not At Risk   Fear of Current or Ex-Partner: No   Emotionally Abused: No   Physically Abused: No   Sexually Abused: No    Review of Systems: See HPI, otherwise negative ROS  Physical Exam: BP (!) 186/69   Pulse 69   Temp 97.8 F (36.6 C) (Temporal)   Resp 16   Ht 5\' 9"  (1.753 m)   Wt 88.9 kg   SpO2 100%  BMI 28.94 kg/m  General:   Alert,  pleasant and cooperative in NAD Head:  Normocephalic and atraumatic. Neck:  Supple; no masses or thyromegaly. Lungs:  Clear throughout to auscultation, normal respiratory effort.    Heart:  +S1, +S2, Regular rate and rhythm, No edema. Abdomen:  Soft, nontender and nondistended. Normal bowel sounds, without guarding, and without rebound.   Neurologic:  Alert and  oriented x4;  grossly normal neurologically.  Impression/Plan: Nicholas Nolan is here for an colonoscopy to be performed for Screening colonoscopy due to prior poor bowel prep .  Risks, benefits, limitations, and alternatives regarding  colonoscopy have been reviewed with the patient.  Questions have been answered.  All parties agreeable.   Nicholas Bellows, MD  10/08/2021, 9:26 AM

## 2021-10-09 ENCOUNTER — Encounter: Payer: Self-pay | Admitting: Gastroenterology

## 2021-10-09 LAB — SURGICAL PATHOLOGY

## 2021-10-12 ENCOUNTER — Encounter: Payer: Self-pay | Admitting: Gastroenterology

## 2021-10-13 ENCOUNTER — Ambulatory Visit: Payer: Medicare PPO | Admitting: Gastroenterology

## 2021-10-26 ENCOUNTER — Encounter: Payer: Self-pay | Admitting: Gastroenterology

## 2021-11-16 ENCOUNTER — Other Ambulatory Visit: Payer: Self-pay

## 2021-11-16 ENCOUNTER — Encounter: Payer: Self-pay | Admitting: Gastroenterology

## 2021-11-16 ENCOUNTER — Ambulatory Visit: Payer: Medicare PPO | Admitting: Gastroenterology

## 2021-11-16 VITALS — BP 164/64 | HR 69 | Temp 97.9°F | Ht 69.0 in | Wt 195.0 lb

## 2021-11-16 DIAGNOSIS — K529 Noninfective gastroenteritis and colitis, unspecified: Secondary | ICD-10-CM | POA: Diagnosis not present

## 2021-11-16 NOTE — Progress Notes (Signed)
[name] MD, MRCP(U.K) [address]  [place]  [place], [place] [address]  Main: [phone]  Fax: [phone]   Primary Care Physician: Mar Daring, PA-C  Primary Gastroenterologist:  Dr. [name]   Chief Complaint  Patient presents with   Diarrhea    HPI: Nicholas Nolan is a 71 y.o. male   Summary of history :  Initially referred and seen on 09/17/2021.  He was seen for diarrhea.  Started taking olmesartan 5 to 6 months back. 12/25/2019: Colonoscopy : fair prep- 5 polyps resected 4/5 adenomas.  06/10/2021: Hb 10. 4 grams , cr 2.  09/18/2021 celiac serology negative, stool for GI PCR was negative as well as fecal calprotectin.   Interval history   09/17/2021-11/16/2021 10/08/2021 -4 polyps were resected Still has diarrhea up to 7 times a day.  Has a fair amount of gas and bloating.  Denies any use of any artificial sugars or sweeteners.  Current Outpatient Medications  Medication Sig Dispense Refill   amLODipine (NORVASC) 10 MG tablet Take 1 tablet (10 mg total) by mouth daily. 90 tablet 3   aspirin EC 81 MG tablet Take 1 tablet (81 mg total) by mouth daily. Swallow whole. 90 tablet 3   atorvastatin (LIPITOR) 80 MG tablet TAKE 1 TABLET BY MOUTH EVERY DAY 90 tablet 3   carvedilol (COREG) 6.25 MG tablet Take 1 tablet (6.25 mg total) by mouth 2 (two) times daily. 180 tablet 0   cilostazol (PLETAL) 50 MG tablet Take 1 tablet (50 mg total) by mouth 2 (two) times daily. 180 tablet 0   Dulaglutide 1.5 MG/0.5ML SOPN Inject 1.5 mg into the skin once a week.      FARXIGA 10 MG TABS tablet Take 10 mg by mouth every morning.     ferrous sulfate 325 (65 FE) MG tablet Take by mouth.     finasteride (PROSCAR) 5 MG tablet Take 1 tablet (5 mg total) by mouth daily. 30 tablet 11   fluorouracil (EFUDEX) 5 % cream Apply to both ears twice daily for four weeks.     glimepiride (AMARYL) 2 MG tablet Take 2 mg by mouth daily.     glucose blood (ONETOUCH VERIO) test strip  USE ONCE DAILY. USE AS INSTRUCTED.     hydrALAZINE (APRESOLINE) 25 MG tablet Take 1 tablet (25 mg total) by mouth in the morning and at bedtime. 180 tablet 3   olmesartan (BENICAR) 40 MG tablet Take 1 tablet (40 mg total) by mouth daily. 30 tablet 5   tamsulosin (FLOMAX) 0.4 MG CAPS capsule TAKE 1 CAPSULE (0.4 MG TOTAL) BY MOUTH DAILY AFTER SUPPER. 90 capsule 1   No current facility-administered medications for this visit.    Allergies as of 11/16/2021 - Review Complete 11/16/2021  Allergen Reaction Noted   Codeine Itching 01/10/2012   Hydromorphone Itching 05/07/2016   Morphine and related Itching 05/07/2016    ROS:  General: Negative for anorexia, weight loss, fever, chills, fatigue, weakness. ENT: Negative for hoarseness, difficulty swallowing , nasal congestion. CV: Negative for chest pain, angina, palpitations, dyspnea on exertion, peripheral edema.  Respiratory: Negative for dyspnea at rest, dyspnea on exertion, cough, sputum, wheezing.  GI: See history of present illness. GU:  Negative for dysuria, hematuria, urinary incontinence, urinary frequency, nocturnal urination.  Endo: Negative for unusual weight change.    Physical Examination:   BP (!) 164/64    Pulse 69    Temp 97.9 F (36.6 C) (Oral)  Ht 5\' 9"  (1.753 m)    Wt 195 lb (88.5 kg)    BMI 28.80 kg/m   General: Well-nourished, well-developed in no acute distress.  Eyes: No icterus. Conjunctivae pink. Mouth: Oropharyngeal mucosa moist and pink , no lesions erythema or exudate. Neuro: Alert and oriented x 3.  Grossly intact. Skin: Warm and dry, no jaundice.   Psych: Alert and cooperative, normal mood and affect.   Imaging Studies: No results found.  Assessment and Plan:   Nicholas Nolan is a 71 y.o. y/o male here to follow-up for chronic diarrhea.  Has been on olmesartan.  Recent colonoscopy and biopsy did not show any gross abnormality.  Still has significant diarrhea.  My differentials at this point of time  is IBS diarrhea versus medication induced diarrhea from olmesartan.  Plan 1.  Commence on Xifaxan to treat IBS diarrhea samples will be provided. 2.  I will see him back in 2 weeks if no better we will empirically treat for microscopic colitis which is strongly suspected him I think is associated olmesartan. 3.  I will send a message to his primary care provider to consider changing from olmesartan to a different antihypertensive.     Dr [name]  MD,MRCP [facility]) Follow up in 2 weeks

## 2021-11-18 ENCOUNTER — Other Ambulatory Visit: Payer: Self-pay

## 2021-11-20 ENCOUNTER — Other Ambulatory Visit: Payer: Self-pay

## 2021-11-20 DIAGNOSIS — R972 Elevated prostate specific antigen [PSA]: Secondary | ICD-10-CM

## 2021-11-23 ENCOUNTER — Other Ambulatory Visit: Payer: Self-pay

## 2021-11-23 ENCOUNTER — Other Ambulatory Visit: Payer: Medicare PPO

## 2021-11-23 DIAGNOSIS — R972 Elevated prostate specific antigen [PSA]: Secondary | ICD-10-CM

## 2021-11-24 LAB — PSA: Prostate Specific Ag, Serum: 14.2 ng/mL — ABNORMAL HIGH (ref 0.0–4.0)

## 2021-11-25 LAB — MICROALBUMIN / CREATININE URINE RATIO: Microalb Creat Ratio: 343.8 — AB (ref 40–300)

## 2021-11-25 LAB — HEMOGLOBIN A1C: Hemoglobin A1C: 5.6 (ref 4.2–5.6)

## 2021-11-27 ENCOUNTER — Encounter: Payer: Self-pay | Admitting: *Deleted

## 2021-11-30 ENCOUNTER — Ambulatory Visit: Payer: Medicare PPO | Admitting: Gastroenterology

## 2021-12-01 ENCOUNTER — Other Ambulatory Visit: Payer: Self-pay

## 2021-12-01 MED ORDER — CARVEDILOL 6.25 MG PO TABS: 6.2500 mg | ORAL_TABLET | Freq: Two times a day (BID) | ORAL | 0 refills | Status: DC

## 2022-01-25 ENCOUNTER — Other Ambulatory Visit: Payer: Self-pay

## 2022-01-25 ENCOUNTER — Encounter: Payer: Self-pay | Admitting: Cardiology

## 2022-01-25 ENCOUNTER — Ambulatory Visit: Payer: Medicare PPO | Admitting: Cardiology

## 2022-01-25 VITALS — BP 150/60 | HR 65 | Ht 70.0 in | Wt 190.0 lb

## 2022-01-25 DIAGNOSIS — I2510 Atherosclerotic heart disease of native coronary artery without angina pectoris: Secondary | ICD-10-CM

## 2022-01-25 DIAGNOSIS — I10 Essential (primary) hypertension: Secondary | ICD-10-CM

## 2022-01-25 MED ORDER — HYDRALAZINE HCL 50 MG PO TABS: 50.0000 mg | ORAL_TABLET | Freq: Three times a day (TID) | ORAL | 3 refills | Status: DC

## 2022-01-25 NOTE — Progress Notes (Signed)
?Cardiology Office Note:   ? ?Date:  01/25/2022  ? ?ID:  Nicholas Nolan, DOB 04/17/1951, MRN [id number] ? ?PCP:  Mar Daring, PA-C  ?Cardiologist:  Kate Sable, MD  ?Electrophysiologist:  None  ? ?Referring MD: Florian Buff*  ? ?Chief Complaint  ?Patient presents with  ? Other  ?  6 month follow up -- Meds reviewed verbally with patient.   ? ? ?History of Present Illness:   ? ?Nicholas Nolan is a 71 y.o. male with a hx of CAD (CCTA 10/2019-Ca score 1689, mid LAD, LCx disease, FFRct with no sig obstruction), PAD, CKD, hypertension, diabetes, hyperlipidemia, former smoker x40+ years who presents for follow-up. ? ?Being seen for CAD and hypertension.  Previously started on hydralazine.  Blood pressures at home range in the 916B systolic.  Recently saw nephrologist, renal function appears decreased, benazepril was decreased to 20 mg daily.  Feels well, denies chest pain.  Currently takes hydralazine 50 mg twice daily. ? ? ?Prior notes ? Renal ultrasound and renal angiogram were performed 06/2019 to evaluate for possible secondary causes but that was normal  ?Echo 08/2019 showed normal systolic function, EF 60 to 65%, impaired relaxation, mild LVH. ?PAD, evaluated by interventional cardiology, angio being deferred for now due to risk of CIN ? ? ?Past Medical History:  ?Diagnosis Date  ? Arthritis   ? hands  ? Back pain   ? leg pain  ? CAD (coronary artery disease)   ? a. 10/2019 Cor CTA: LM nl, LAD nl, LCX distal dzs w/ FFR 0.65-0.75. RCA nl-->Med Rx.  ? CKD (chronic kidney disease), stage III ([place])   ? Claudication [facility])   ? Diabetes mellitus   ? type 2  ? Diastolic dysfunction   ? a. 08/2019 Echo: EF 60-65%, mild LVH, impaired relaxation. Nl RV size/fxn. Nl LA szie.  ? History of urinary retention   ? Hyperlipidemia   ? Hypertension   ? a. 05/2019 renal u/s concerning for R RAS; b. 06/2019 Renal artery angio: no more than15% bilat RAS.  ? Neuropathy   ? feet  ? 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 RUL Lobectomy  ? Wears dentures   ? full upper  ? ? ?Past Surgical History:  ?Procedure Laterality Date  ? BASAL CELL CARCINOMA EXCISION    ? COLONOSCOPY WITH PROPOFOL N/A 12/25/2019  ? Procedure: COLONOSCOPY WITH PROPOFOL;  Surgeon: Lin Landsman, MD;  Location: [place];  Service: Endoscopy;  Laterality: N/A;  Diabetic - injectable and oral meds  ? COLONOSCOPY WITH PROPOFOL N/A 10/08/2021  ? Procedure: COLONOSCOPY WITH PROPOFOL;  Surgeon: [name], MD;  Location[facility] ENDOSCOPY;  Service: Gastroenterology;  Laterality: N/A;  ? LUMBAR LAMINECTOMY/DECOMPRESSION MICRODISCECTOMY  01/10/2012  ? Procedure: LUMBAR LAMINECTOMY/DECOMPRESSION MICRODISCECTOMY 2 LEVELS;  Surgeon: Hosie Spangle, MD;  Location: [place] NEURO ORS;  Service: Neurosurgery;  Laterality: Bilateral;  Lumbar four-Sacral One laminectomies  ? LUNG SURGERY Right 11/22/12  ? right upper lobe  ? POLYPECTOMY N/A 12/25/2019  ? Procedure: POLYPECTOMY;  Surgeon: Lin Landsman, MD;  Location: [place];  Service: Endoscopy;  Laterality: N/A;  ? PROSTATE SURGERY    ? biopsy-due to elelvated PSA  ? RENAL ANGIOGRAPHY Right 07/02/2019  ? Procedure: RENAL ANGIOGRAPHY;  Surgeon: Algernon Huxley, MD;  Location: [place] CV LAB;  Service: Cardiovascular;  Laterality: Right;  ? SHOULDER SURGERY    ? VASECTOMY    ? ? ?Current  Medications: ?Current Meds  ?Medication Sig  ? amLODipine (NORVASC) 10 MG tablet Take 1 tablet (10 mg total) by mouth daily.  ? aspirin EC 81 MG tablet Take 1 tablet (81 mg total) by mouth daily. Swallow whole.  ? atorvastatin (LIPITOR) 80 MG tablet TAKE 1 TABLET BY MOUTH EVERY DAY  ? carvedilol (COREG) 6.25 MG tablet Take 1 tablet (6.25 mg total) by mouth 2 (two) times daily.  ? cilostazol (PLETAL) 50 MG tablet Take 1 tablet (50 mg total) by mouth 2 (two) times daily.  ? Dulaglutide 1.5 MG/0.5ML SOPN Inject 1.5 mg into the skin once a week.   ? FARXIGA 10 MG TABS tablet Take 10 mg by mouth  every morning.  ? ferrous sulfate 325 (65 FE) MG tablet Take by mouth.  ? finasteride (PROSCAR) 5 MG tablet Take 1 tablet (5 mg total) by mouth daily.  ? fluorouracil (EFUDEX) 5 % cream Apply to both ears twice daily for four weeks.  ? glimepiride (AMARYL) 2 MG tablet Take 2 mg by mouth daily.  ? glucose blood (ONETOUCH VERIO) test strip USE ONCE DAILY. USE AS INSTRUCTED.  ? olmesartan (BENICAR) 40 MG tablet Take 20 mg by mouth daily.  ? tamsulosin (FLOMAX) 0.4 MG CAPS capsule TAKE 1 CAPSULE (0.4 MG TOTAL) BY MOUTH DAILY AFTER SUPPER.  ? [DISCONTINUED] hydrALAZINE (APRESOLINE) 25 MG tablet Take 1 tablet (25 mg total) by mouth in the morning and at bedtime.  ?  ? ?Allergies:   Codeine, Hydromorphone, and Morphine and related  ? ?Social History  ? ?Socioeconomic History  ? Marital status: Married  ?  Spouse name: Not on file  ? Number of children: 4  ? Years of education: Not on file  ? Highest education level: Associate degree: occupational, Hotel manager, or vocational program  ?Occupational History  ? Occupation: retired  ?Tobacco Use  ? Smoking status: Former  ?  Packs/day: 2.00  ?  Years: 45.00  ?  Pack years: 90.00  ?  Types: Cigarettes  ?  Quit date: 11/28/2012  ?  Years since quitting: 9.1  ? Smokeless tobacco: Current  ?  Types: Snuff  ? Tobacco comments:  ?  Quit in 2014  ?Vaping Use  ? Vaping Use: Never used  ?Substance and Sexual Activity  ? Alcohol use: No  ? Drug use: No  ? Sexual activity: Yes  ?  Birth control/protection: None  ?Other Topics Concern  ? Not on file  ?Social History Narrative  ? Not on file  ? ?Social Determinants of Health  ? ?Financial Resource Strain: Not on file  ?Food Insecurity: Not on file  ?Transportation Needs: Not on file  ?Physical Activity: Not on file  ?Stress: Not on file  ?Social Connections: Not on file  ?  ? ?Family History: ?The patient's family history includes Cancer in his brother and father; Heart disease in his father; Stroke in his father. ? ?ROS:   ?Please see the  history of present illness.    ? All other systems reviewed and are negative. ? ?EKGs/Labs/Other Studies Reviewed:   ? ?The following studies were reviewed today: ? ? ?EKG:  EKG is ordered today.  EKG shows normal sinus rhythm, normal ECG. ? ?Recent Labs: ?02/08/2021: BUN 46; Creatinine, Ser 1.99; Potassium 4.7; Sodium 139 ?06/09/2021: Hemoglobin 10.4; Platelets 206; TSH 0.874  ?Recent Lipid Panel ?   ?Component Value Date/Time  ? CHOL 126 08/21/2020 1246  ? CHOL 221 (H) 10/19/2019 1046  ? TRIG 89 08/21/2020 1246  ?  HDL 48 08/21/2020 1246  ? HDL 43 10/19/2019 1046  ? CHOLHDL 2.6 08/21/2020 1246  ? VLDL 18 08/21/2020 1246  ? [place] 60 08/21/2020 1246  ? LDLCALC 150 (H) 10/19/2019 1046  ? ? ?Physical Exam:   ? ?VS:  BP (!) 150/60 (BP Location: Left Arm, Patient Position: Sitting, Cuff Size: Normal)   Pulse 65   Ht 5\' 10"  (1.778 m)   Wt 190 lb (86.2 kg)   SpO2 98%   BMI 27.26 kg/m?    ? ?Wt Readings from Last 3 Encounters:  ?01/25/22 190 lb (86.2 kg)  ?11/16/21 195 lb (88.5 kg)  ?10/08/21 196 lb (88.9 kg)  ?  ? ?GEN:  Well nourished, well developed in no acute distress ?HEENT: Normal ?NECK: No JVD; bilateral carotid bruit noted worse on the right. ?CARDIAC: RRR, no murmurs, rubs, gallops ?RESPIRATORY:  Clear to auscultation without rales, wheezing or rhonchi  ?ABDOMEN: Soft, non-tender, non-distended ?MUSCULOSKELETAL:  No edema; No deformity, extremities appear warm, DP difficult to palpate ?SKIN: Warm and dry ?NEUROLOGIC:  Alert and oriented x 3 ?PSYCHIATRIC:  Normal affect  ? ?ASSESSMENT:   ? ?1. Primary hypertension   ?2. Coronary artery disease involving native coronary artery of native heart without angina pectoris   ? ? ?PLAN:   ? ?In order of problems listed above: ? ?Hypertension, BP elevated.  Increase hydralazine to 50 mg 3 times daily, continue Benicar 20 mg daily, Norvasc 10 mg, Coreg 6.25 mg twice daily.  Follow-up in 6 weeks titrate hydralazine for adequate BP control. ?nonobstructive CAD.   Asymptomatic.  Continue aspirin, Lipitor, beta-blocker. ? ?Follow-up in 6 weeks for BP ? ?Medication Adjustments/Labs and Tests Ordered: ?Current medicines are reviewed at length with the patient today.  Concerns regarding me

## 2022-01-25 NOTE — Patient Instructions (Signed)
Medication Instructions:  ? ?Your physician has recommended you make the following change in your medication:  ? ?INCREASE your Hydralazine to 50 MG Three times a day. ? ?*If you need a refill on your cardiac medications before your next appointment, please call your pharmacy* ? ? ?Lab Work: ?None ordered ?If you have labs (blood work) drawn today and your tests are completely normal, you will receive your results only by: ?MyChart Message (if you have MyChart) OR ?A paper copy in the mail ?If you have any lab test that is abnormal or we need to change your treatment, we will call you to review the results. ? ? ?Testing/Procedures: ?None ordered ? ? ?Follow-Up: ?At [facility], you and your health needs are our priority.  As part of our continuing mission to provide you with exceptional heart care, we have created designated Provider Care Teams.  These Care Teams include your primary Cardiologist (physician) and Advanced Practice Providers (APPs -  Physician Assistants and Nurse Practitioners) who all work together to provide you with the care you need, when you need it. ? ?We recommend signing up for the patient portal called "MyChart".  Sign up information is provided on this After Visit Summary.  MyChart is used to connect with patients for Virtual Visits (Telemedicine).  Patients are able to view lab/test results, encounter notes, upcoming appointments, etc.  Non-urgent messages can be sent to your provider as well.   ?To learn more about what you can do with MyChart, go to [email or web address].   ? ?Your next appointment:   ?6 week(s) ? ?The format for your next appointment:   ?In Person ? ?Provider:   ?You will see one of the following Advanced Practice Providers on your designated Care Team:   ?Murray Hodgkins, NP ?Christell Faith, PA-C ?Cadence Kathlen Mody, PA-C ? ?  ? ? ?Other Instructions ? ? ?

## 2022-01-27 ENCOUNTER — Other Ambulatory Visit: Payer: Self-pay | Admitting: *Deleted

## 2022-01-27 MED ORDER — ATORVASTATIN CALCIUM 80 MG PO TABS: 80.0000 mg | ORAL_TABLET | Freq: Every day | ORAL | 3 refills | Status: DC

## 2022-02-09 ENCOUNTER — Other Ambulatory Visit: Payer: Self-pay

## 2022-02-09 MED ORDER — OLMESARTAN MEDOXOMIL 40 MG PO TABS: 20.0000 mg | ORAL_TABLET | Freq: Every day | ORAL | 5 refills | Status: DC

## 2022-02-10 ENCOUNTER — Ambulatory Visit: Payer: Medicare PPO | Admitting: Physician Assistant

## 2022-02-10 ENCOUNTER — Encounter: Payer: Self-pay | Admitting: Physician Assistant

## 2022-02-10 VITALS — BP 147/59 | HR 74 | Ht 70.0 in | Wt 187.2 lb

## 2022-02-10 DIAGNOSIS — F322 Major depressive disorder, single episode, severe without psychotic features: Secondary | ICD-10-CM

## 2022-02-10 MED ORDER — SERTRALINE HCL 50 MG PO TABS: 50.0000 mg | ORAL_TABLET | Freq: Every day | ORAL | 1 refills | Status: DC

## 2022-02-10 NOTE — Patient Instructions (Signed)
Emergency Mental Health Services: ? ?[facility] (like an urgent care for mental health) ?[address] [place], [place] [address] ?[phone] ? ?[place] ?Several different mental health services, has a crisis center ? ?[place], [place] ?[address], [place], [place] [address] ? [phone] ?Same-Day Access Hours:  Monday, Wednesday and Friday, [facility]: ?Walk-In Crisis Hours: 7 days/week, 8am - 12am ?-  [employer] ?-  Law Enforcement Drop-Off (side door) ? ?

## 2022-02-10 NOTE — Progress Notes (Signed)
? ?I,Nicholas Nolan,acting as a scribe for [employer], PA-C.,have documented all relevant documentation on the behalf of Nicholas Kirschner, PA-C,as directed by  Nicholas Kirschner, PA-C while in the presence of Nicholas Kirschner, PA-C. ? ?Established Patient Office Visit ? ?Subjective:  ?Patient ID: Nicholas Nolan, male    DOB: 10/30/1951  Age: 71 y.o. MRN: [id number] ? ?CC: anxiety/depression ? ?Nicholas Nolan is a 71 y/o male who presents today with depressed mood, anxious thoughts, trouble sleeping, fidgeting, restlessness, stress for the past few months. Reports his elderly mother w/ alzhemiers lives with him and he frequently worries about her. Denies SI.  ? ? ?  02/10/2022  ?  3:36 PM  ?GAD 7 : Generalized Anxiety Score  ?Nervous, Anxious, on Edge 3  ?Control/stop worrying 3  ?Worry too much - different things 3  ?Trouble relaxing 3  ?Restless 3  ?Easily annoyed or irritable 3  ?Afraid - awful might happen 3  ?Total GAD 7 Score 21  ?Anxiety Difficulty Very difficult  ? ? ? ? ?  02/10/2022  ?  3:35 PM 06/09/2021  ?  1:16 PM 12/31/2020  ?  3:36 PM  ?PHQ9 SCORE ONLY  ?PHQ-9 Total Score 21 2 0  ? ? ?Past Medical History:  ?Diagnosis Date  ? Arthritis   ? hands  ? Back pain   ? leg pain  ? CAD (coronary artery disease)   ? a. 10/2019 Cor CTA: LM nl, LAD nl, LCX distal dzs w/ FFR 0.65-0.75. RCA nl-->Med Rx.  ? CKD (chronic kidney disease), stage III ([place])   ? Claudication [facility])   ? Diabetes mellitus   ? type 2  ? Diastolic dysfunction   ? a. 08/2019 Echo: EF 60-65%, mild LVH, impaired relaxation. Nl RV size/fxn. Nl LA szie.  ? History of urinary retention   ? Hyperlipidemia   ? Hypertension   ? a. 05/2019 renal u/s concerning for R RAS; b. 06/2019 Renal artery angio: no more than15% bilat RAS.  ? Neuropathy   ? feet  ? Primary cancer of right upper lobe of lung ([place]) 2014  ? RUL Lobectomy  ? Wears dentures   ? full upper  ? ? ?Past Surgical History:  ?Procedure Laterality Date  ? BASAL CELL CARCINOMA EXCISION    ? COLONOSCOPY WITH  PROPOFOL N/A 12/25/2019  ? Procedure: COLONOSCOPY WITH PROPOFOL;  Surgeon: Lin Landsman, MD;  Location: [place];  Service: Endoscopy;  Laterality: N/A;  Diabetic - injectable and oral meds  ? COLONOSCOPY WITH PROPOFOL N/A 10/08/2021  ? Procedure: COLONOSCOPY WITH PROPOFOL;  Surgeon: [name], MD;  Location: [facility] ENDOSCOPY;  Service: Gastroenterology;  Laterality: N/A;  ? LUMBAR LAMINECTOMY/DECOMPRESSION MICRODISCECTOMY  01/10/2012  ? Procedure: LUMBAR LAMINECTOMY/DECOMPRESSION MICRODISCECTOMY 2 LEVELS;  Surgeon: Hosie Spangle, MD;  Location: [place] NEURO ORS;  Service: Neurosurgery;  Laterality: Bilateral;  Lumbar four-Sacral One laminectomies  ? LUNG SURGERY Right 11/22/12  ? right upper lobe  ? POLYPECTOMY N/A 12/25/2019  ? Procedure: POLYPECTOMY;  Surgeon: Lin Landsman, MD;  Location: [place];  Service: Endoscopy;  Laterality: N/A;  ? PROSTATE SURGERY    ? biopsy-due to elelvated PSA  ? RENAL ANGIOGRAPHY Right 07/02/2019  ? Procedure: RENAL ANGIOGRAPHY;  Surgeon: Algernon Huxley, MD;  Location: [place] CV LAB;  Service: Cardiovascular;  Laterality: Right;  ? SHOULDER SURGERY    ? VASECTOMY    ? ? ?Family History  ?Problem Relation Age of Onset  ? Cancer Father   ?  prostate  ? Heart disease Father   ?     MI  ? Stroke Father   ? Cancer Brother   ?     liver  ? ? ?Social History  ? ?Socioeconomic History  ? Marital status: Married  ?  Spouse name: Not on file  ? Number of children: 4  ? Years of education: Not on file  ? Highest education level: Associate degree: occupational, Hotel manager, or vocational program  ?Occupational History  ? Occupation: retired  ?Tobacco Use  ? Smoking status: Former  ?  Packs/day: 2.00  ?  Years: 45.00  ?  Pack years: 90.00  ?  Types: Cigarettes  ?  Quit date: 11/28/2012  ?  Years since quitting: 9.2  ? Smokeless tobacco: Current  ?  Types: Snuff  ? Tobacco comments:  ?  Quit in 2014  ?Vaping Use  ? Vaping Use: Never used  ?Substance and Sexual  Activity  ? Alcohol use: No  ? Drug use: No  ? Sexual activity: Yes  ?  Birth control/protection: None  ?Other Topics Concern  ? Not on file  ?Social History Narrative  ? Not on file  ? ?Social Determinants of Health  ? ?Financial Resource Strain: Not on file  ?Food Insecurity: Not on file  ?Transportation Needs: Not on file  ?Physical Activity: Not on file  ?Stress: Not on file  ?Social Connections: Not on file  ?Intimate Partner Violence: Not on file  ? ? ?Outpatient Medications Prior to Visit  ?Medication Sig Dispense Refill  ? amLODipine (NORVASC) 10 MG tablet Take 1 tablet (10 mg total) by mouth daily. 90 tablet 3  ? aspirin EC 81 MG tablet Take 1 tablet (81 mg total) by mouth daily. Swallow whole. 90 tablet 3  ? atorvastatin (LIPITOR) 80 MG tablet Take 1 tablet (80 mg total) by mouth daily. 90 tablet 3  ? carvedilol (COREG) 6.25 MG tablet Take 1 tablet (6.25 mg total) by mouth 2 (two) times daily. 180 tablet 0  ? cilostazol (PLETAL) 50 MG tablet Take 1 tablet (50 mg total) by mouth 2 (two) times daily. 180 tablet 0  ? Dulaglutide 1.5 MG/0.5ML SOPN Inject 1.5 mg into the skin once a week.     ? FARXIGA 10 MG TABS tablet Take 10 mg by mouth every morning.    ? finasteride (PROSCAR) 5 MG tablet Take 1 tablet (5 mg total) by mouth daily. 30 tablet 11  ? glucose blood (ONETOUCH VERIO) test strip USE ONCE DAILY. USE AS INSTRUCTED.    ? hydrALAZINE (APRESOLINE) 50 MG tablet Take 1 tablet (50 mg total) by mouth 3 (three) times daily. 90 tablet 3  ? olmesartan (BENICAR) 40 MG tablet Take 0.5 tablets (20 mg total) by mouth daily. 30 tablet 5  ? tamsulosin (FLOMAX) 0.4 MG CAPS capsule TAKE 1 CAPSULE (0.4 MG TOTAL) BY MOUTH DAILY AFTER SUPPER. 90 capsule 1  ? ferrous sulfate 325 (65 FE) MG tablet Take by mouth. (Patient not taking: Reported on 02/10/2022)    ? fluorouracil (EFUDEX) 5 % cream Apply to both ears twice daily for four weeks. (Patient not taking: Reported on 02/10/2022)    ? glimepiride (AMARYL) 2 MG tablet Take 2  mg by mouth daily. (Patient not taking: Reported on 02/10/2022)    ? ?No facility-administered medications prior to visit.  ? ? ?Allergies  ?Allergen Reactions  ? Codeine Itching  ?  And hyper also  ? Hydromorphone Itching  ? Morphine And Related  Itching  ? ? ?ROS ?Review of Systems  ?Constitutional:  Negative for fatigue and fever.  ?Respiratory:  Negative for cough and shortness of breath.   ?Cardiovascular:  Negative for chest pain, palpitations and leg swelling.  ?Neurological:  Negative for dizziness and headaches.  ?Psychiatric/Behavioral:  Positive for decreased concentration, dysphoric mood and sleep disturbance. The patient is nervous/anxious.   ? ?  ?Objective:  ?  ?Physical Exam ?Constitutional:   ?   Appearance: Normal appearance. He is not ill-appearing.  ?HENT:  ?   Head: Normocephalic.  ?Eyes:  ?   Conjunctiva/sclera: Conjunctivae normal.  ?Cardiovascular:  ?   Rate and Rhythm: Normal rate and regular rhythm.  ?   Pulses: Normal pulses.  ?Pulmonary:  ?   Effort: Pulmonary effort is normal.  ?   Breath sounds: Normal breath sounds.  ?Neurological:  ?   Mental Status: He is oriented to person, place, and time.  ?Psychiatric:     ?   Mood and Affect: Mood normal.     ?   Behavior: Behavior normal.  ? ? ?BP (!) 147/59   Pulse 74   Ht 5\' 10"  (1.778 m)   Wt 187 lb 3.2 oz (84.9 kg)   SpO2 97%   BMI 26.86 kg/m?  ?Wt Readings from Last 3 Encounters:  ?02/10/22 187 lb 3.2 oz (84.9 kg)  ?01/25/22 190 lb (86.2 kg)  ?11/16/21 195 lb (88.5 kg)  ? ? ? ?Health Maintenance Due  ?Topic Date Due  ? COVID-19 Vaccine (1) Never done  ? OPHTHALMOLOGY EXAM  Never done  ? Hepatitis C Screening  Never done  ? Zoster Vaccines- Shingrix (1 of 2) Never done  ? Pneumonia Vaccine 58+ Years old (1 - PCV) Never done  ? HEMOGLOBIN A1C  04/05/2021  ? FOOT EXAM  10/06/2021  ? ? ?There are no preventive care reminders to display for this patient. ? ?Lab Results  ?Component Value Date  ? TSH 0.874 06/09/2021  ? ?Lab Results  ?Component  Value Date  ? WBC 5.4 06/09/2021  ? HGB 10.4 (L) 06/09/2021  ? HCT 31.8 (L) 06/09/2021  ? MCV 89 06/09/2021  ? PLT 206 06/09/2021  ? ?Lab Results  ?Component Value Date  ? NA 139 02/08/2021  ? K 4.7 02/08/2021

## 2022-02-10 NOTE — Assessment & Plan Note (Signed)
Discussed therapy, anxiety/depressant medication.  ?Pt open to most options given severity of current symptoms. ?Starting on sertraline 50 mg, should not need dose adjustments depsite CKD, does not appear to interact w/ any of current meds.  ?Ref to social work to help coordinate therapy or to give counseling themselves. Pt seemed hesitant to travel out of [place] for therapy. ? ?F/u 6 weeks ?

## 2022-02-11 ENCOUNTER — Telehealth: Payer: Self-pay | Admitting: *Deleted

## 2022-02-11 NOTE — Chronic Care Management (AMB) (Signed)
?  Care Management  ? ?Note ? ?02/11/2022 ?Name: Nicholas Nolan MRN: [id number] DOB: 07/22/1951 ? ?DOY TAAFFE is a 71 y.o. year old male who is a primary care patient of [name], [place]. I reached out to [name] by phone today offer care coordination services.  ? ?Mr. Koegel was given information about care management services today including:  ?Care management services include personalized support from designated clinical staff supervised by his physician, including individualized plan of care and coordination with other care providers ?24/7 contact phone numbers for assistance for urgent and routine care needs. ?The patient may stop care management services at any time by phone call to the office staff. ? ?Patient agreed to services and verbal consent obtained.  ? ?Follow up plan: ?Telephone appointment with care management team member scheduled for: 02/25/2022 ? ?Cammeron Greis, CCMA ?Care Guide, Embedded Care Coordination ?[place]  Care Management  ?Direct Dial: [phone] ? ? ?

## 2022-02-18 ENCOUNTER — Ambulatory Visit (INDEPENDENT_AMBULATORY_CARE_PROVIDER_SITE_OTHER): Payer: Medicare PPO | Admitting: Physician Assistant

## 2022-02-18 ENCOUNTER — Telehealth: Payer: Medicare PPO | Admitting: Physician Assistant

## 2022-02-18 ENCOUNTER — Encounter: Payer: Self-pay | Admitting: Physician Assistant

## 2022-02-18 DIAGNOSIS — F322 Major depressive disorder, single episode, severe without psychotic features: Secondary | ICD-10-CM

## 2022-02-18 NOTE — Patient Instructions (Signed)
Emergency Mental Health Services: ? ?[facility] (like an urgent care for mental health) ?[address], [place], [place] [address] ?[phone] ? ?[place] ?Several different mental health services, has a crisis center ? ?[place], [place] ?[address], [place], [place] [address] ? [phone] ?Same-Day Access Hours:  Monday, Wednesday and Friday, [place]: ?Walk-In Crisis Hours: 7 days/week, 8am - 12am ?-  [employer] ?-  Law Enforcement Drop-Off (side door) ? ?

## 2022-02-18 NOTE — Progress Notes (Signed)
? ? ?Virtual telephone visit ? ? ? ?Virtual Visit via Telephone Note  ? ?This visit type was conducted due to national recommendations for restrictions regarding the COVID-19 Pandemic (e.g. social distancing) in an effort to limit this patient's exposure and mitigate transmission in our community. Due to his co-morbid illnesses, this patient is at least at moderate risk for complications without adequate follow up. This format is felt to be most appropriate for this patient at this time. The patient did not have access to video technology or had technical difficulties with video requiring transitioning to audio format only (telephone). Physical exam was limited to content and character of the telephone converstion.   ? ?Patient location: home ?Provider location: [facility] ? ?I discussed the limitations of evaluation and management by telemedicine and the availability of in person appointments. The patient expressed understanding and agreed to proceed.  ? ?Visit Date: 02/18/2022 ? ?Today's healthcare provider: [name], PA-C  ? ?Chief Complaint  ?Patient presents with  ? Depression  ? ?Subjective  ?  ?HPI  ?Depression, Follow-up ?His concerns today is that he feels maybe he is more depressed than anxious. ?Would like a medication that helps today with depression. He states his wife uses ativan, wonders if he could have this prescribed.  ? ?He  was last seen for this 8 days ago. ?Changes made at last visit include starting on sertraline 50 mg. ?  ?He reports good compliance with treatment. ?He is not having side effects.  ? ?He reports good tolerance of treatment. ?Current symptoms include: depressed mood ?He feels he is Unchanged since last visit. ? ? ? ?  02/18/2022  ?  3:48 PM 02/10/2022  ?  3:35 PM 06/09/2021  ?  1:16 PM  ?Depression screen PHQ 2/9  ?Decreased Interest 1 3 0  ?Down, Depressed, Hopeless 3 3 0  ?PHQ - 2 Score 4 6 0  ?Altered sleeping 3 3 1   ?Tired, decreased energy 3 3 1   ?Change in appetite 3 3 0   ?Feeling bad or failure about yourself  3 3 0  ?Trouble concentrating 3 3 0  ?Moving slowly or fidgety/restless 0 0 0  ?Suicidal thoughts 0 0 0  ?PHQ-9 Score 19 21 2   ?Difficult doing work/chores Very difficult Extremely dIfficult Not difficult at all  ?  ?-----------------------------------------------------------------------------------------  ? ?Medications: ?Outpatient Medications Prior to Visit  ?Medication Sig  ? amLODipine (NORVASC) 10 MG tablet Take 1 tablet (10 mg total) by mouth daily.  ? aspirin EC 81 MG tablet Take 1 tablet (81 mg total) by mouth daily. Swallow whole.  ? atorvastatin (LIPITOR) 80 MG tablet Take 1 tablet (80 mg total) by mouth daily.  ? carvedilol (COREG) 6.25 MG tablet Take 1 tablet (6.25 mg total) by mouth 2 (two) times daily.  ? cilostazol (PLETAL) 50 MG tablet Take 1 tablet (50 mg total) by mouth 2 (two) times daily.  ? Dulaglutide 1.5 MG/0.5ML SOPN Inject 1.5 mg into the skin once a week.   ? FARXIGA 10 MG TABS tablet Take 10 mg by mouth every morning.  ? finasteride (PROSCAR) 5 MG tablet Take 1 tablet (5 mg total) by mouth daily.  ? glucose blood (ONETOUCH VERIO) test strip USE ONCE DAILY. USE AS INSTRUCTED.  ? hydrALAZINE (APRESOLINE) 50 MG tablet Take 1 tablet (50 mg total) by mouth 3 (three) times daily.  ? olmesartan (BENICAR) 40 MG tablet Take 0.5 tablets (20 mg total) by mouth daily.  ? sertraline (ZOLOFT) 50 MG tablet  Take 1 tablet (50 mg total) by mouth daily.  ? tamsulosin (FLOMAX) 0.4 MG CAPS capsule TAKE 1 CAPSULE (0.4 MG TOTAL) BY MOUTH DAILY AFTER SUPPER.  ? ?No facility-administered medications prior to visit.  ? ? ?Review of Systems  ?Constitutional:  Positive for appetite change. Negative for chills and fever.  ?Respiratory:  Negative for chest tightness, shortness of breath and wheezing.   ?Cardiovascular:  Negative for chest pain and palpitations.  ?Gastrointestinal:  Negative for abdominal pain, nausea and vomiting.  ?Psychiatric/Behavioral:  Positive for  decreased concentration. The patient is nervous/anxious.   ? ? Objective  ?  ?There were no vitals taken for this visit. ? ? ? Assessment & Plan  ?  ? ?Problem List Items Addressed This Visit   ? ?  ? Other  ? Depression, major, single episode, severe ([place]) - Primary  ?  Explained to pt how medications like ativan may not always be appropriate  ?I feel it may cause more problems than it would solve secondary to age, comorbidities, and polypharmacy. ?Advised pt to continue the sertraline 50 mg daily that it will start to work  ?He has a phone call scheduled with one of our social workers 4/20 which I encouraged him to keep.  ?F/u as planned in 5 weeks ?  ?  ?  ? ?Return as scheduled. ?  ? ?I discussed the assessment and treatment plan with the patient. The patient was provided an opportunity to ask questions and all were answered. The patient agreed with the plan and demonstrated an understanding of the instructions. ?  ?The patient was advised to call back or seek an in-person evaluation if the symptoms worsen or if the condition fails to improve as anticipated. ? ?I provided 10 minutes of non-face-to-face time during this encounter. ? ?I, [name], PA-C have reviewed all documentation for this visit. The documentation on  02/18/2022 for the exam, diagnosis, procedures, and orders are all accurate and complete. ? ?[name], PA-C ?[facility] ?[address] ?[place], [place], [address] ?Office: [phone] ?Fax: [phone]  ? ? Medical Group   ?

## 2022-02-18 NOTE — Assessment & Plan Note (Signed)
Explained to pt how medications like ativan may not always be appropriate  ?I feel it may cause more problems than it would solve secondary to age, comorbidities, and polypharmacy. ?Advised pt to continue the sertraline 50 mg daily that it will start to work  ?He has a phone call scheduled with one of our social workers 4/20 which I encouraged him to keep.  ?F/u as planned in 5 weeks ?

## 2022-02-25 ENCOUNTER — Ambulatory Visit: Payer: Medicare PPO | Admitting: *Deleted

## 2022-02-25 DIAGNOSIS — E119 Type 2 diabetes mellitus without complications: Secondary | ICD-10-CM

## 2022-02-25 DIAGNOSIS — F322 Major depressive disorder, single episode, severe without psychotic features: Secondary | ICD-10-CM

## 2022-02-25 NOTE — Chronic Care Management (AMB) (Signed)
Assessment: Assessed patient's previous and current treatment, coping skills, support system and barriers to care.. No Care Plan was developed during this encounter.  ?Recent life changes Nicholas Nolan: Patient discussed main source of stress and depression is challenges with care giving. Patient currently providing care for family member diagnosed with Dementia. Patient states that mood has improved since visit with provider. Confirmed medication adherence. ? ?Interventions:PHQ2/ PHQ9 completed ?Solution-Focused Strategies employed:  ?Active listening / Reflection utilized  ?Emotional Support Provided ?Participation in counseling encouraged  ?Discussed caregiver resources and support: through [facility] ? ?Recommendation: Patient may benefit from ongoing mental health counseling however patient declines case management services ? ?Follow up Plan: No follow up scheduled with CCM LCSW at this time. Patient will call office if needed. ? ?

## 2022-03-01 ENCOUNTER — Other Ambulatory Visit: Payer: Self-pay

## 2022-03-01 MED ORDER — CARVEDILOL 6.25 MG PO TABS: 6.2500 mg | ORAL_TABLET | Freq: Two times a day (BID) | ORAL | 0 refills | Status: DC

## 2022-03-08 ENCOUNTER — Ambulatory Visit: Payer: Medicare PPO | Admitting: Cardiology

## 2022-03-14 ENCOUNTER — Other Ambulatory Visit: Payer: Self-pay | Admitting: Physician Assistant

## 2022-03-14 ENCOUNTER — Other Ambulatory Visit: Payer: Self-pay | Admitting: Urology

## 2022-03-14 DIAGNOSIS — N401 Enlarged prostate with lower urinary tract symptoms: Secondary | ICD-10-CM

## 2022-03-15 ENCOUNTER — Other Ambulatory Visit: Payer: Self-pay

## 2022-03-15 MED ORDER — HYDRALAZINE HCL 50 MG PO TABS
50.0000 mg | ORAL_TABLET | Freq: Three times a day (TID) | ORAL | 3 refills | Status: DC
Start: 2022-03-15 — End: 2022-03-17

## 2022-03-15 MED ORDER — CILOSTAZOL 50 MG PO TABS: 50.0000 mg | ORAL_TABLET | Freq: Two times a day (BID) | ORAL | 0 refills | Status: DC

## 2022-03-15 MED ORDER — CARVEDILOL 6.25 MG PO TABS: 6.2500 mg | ORAL_TABLET | Freq: Two times a day (BID) | ORAL | 0 refills | Status: DC

## 2022-03-17 ENCOUNTER — Other Ambulatory Visit: Payer: Self-pay

## 2022-03-17 MED ORDER — HYDRALAZINE HCL 50 MG PO TABS: 50.0000 mg | ORAL_TABLET | Freq: Three times a day (TID) | ORAL | 3 refills | Status: DC

## 2022-03-17 MED ORDER — CILOSTAZOL 50 MG PO TABS: 50.0000 mg | ORAL_TABLET | Freq: Two times a day (BID) | ORAL | 3 refills | Status: DC

## 2022-03-24 ENCOUNTER — Encounter: Payer: Self-pay | Admitting: Physician Assistant

## 2022-03-24 ENCOUNTER — Ambulatory Visit: Payer: Medicare PPO | Admitting: Physician Assistant

## 2022-03-24 VITALS — BP 118/53 | HR 72 | Temp 98.6°F | Ht 70.0 in | Wt 180.5 lb

## 2022-03-24 DIAGNOSIS — F322 Major depressive disorder, single episode, severe without psychotic features: Secondary | ICD-10-CM | POA: Diagnosis not present

## 2022-03-24 NOTE — Progress Notes (Signed)
?  ? ? ?Established patient visit ? ? ?Patient: Nicholas Nolan   DOB: 1951/09/16   71 y.o. Male  MRN: [id number] ?Visit Date: 03/24/2022 ? ?Today's healthcare provider: [name], PA-C  ? ?Cc. Depression fu ? ?Subjective  ?  ?HPI  ?Patient is being seen today for a 6 week depression follow up.  ? ?Depression, Follow-up ? ?He  was last seen for this 6 weeks ago. ?Changes made at last visit include sertraline 50 mg. ?  ?He reports excellent compliance with treatment. ?He is not having side effects.  ? ?He reports excellent tolerance of treatment. ?He feels he is Improved since last visit. ? ? ?  03/24/2022  ?  1:08 PM 02/25/2022  ?  2:07 PM 02/18/2022  ?  3:48 PM  ?Depression screen PHQ 2/9  ?Decreased Interest 0 1 1  ?Down, Depressed, Hopeless 0 1 3  ?PHQ - 2 Score 0 2 4  ?Altered sleeping 1 0 3  ?Tired, decreased energy 3 1 3   ?Change in appetite 0 1 3  ?Feeling bad or failure about yourself  0 1 3  ?Trouble concentrating 0 0 3  ?Moving slowly or fidgety/restless 0 0 0  ?Suicidal thoughts 0 0 0  ?PHQ-9 Score 4 5 19   ?Difficult doing work/chores Somewhat difficult  Very difficult  ?  ?-----------------------------------------------------------------------------------------  ? ?Medications: ?Outpatient Medications Prior to Visit  ?Medication Sig  ? amLODipine (NORVASC) 10 MG tablet Take 1 tablet (10 mg total) by mouth daily.  ? aspirin EC 81 MG tablet Take 1 tablet (81 mg total) by mouth daily. Swallow whole.  ? atorvastatin (LIPITOR) 80 MG tablet Take 1 tablet (80 mg total) by mouth daily.  ? carvedilol (COREG) 6.25 MG tablet Take 1 tablet (6.25 mg total) by mouth 2 (two) times daily.  ? cilostazol (PLETAL) 50 MG tablet Take 1 tablet (50 mg total) by mouth 2 (two) times daily.  ? Dulaglutide 1.5 MG/0.5ML SOPN Inject 1.5 mg into the skin once a week.   ? FARXIGA 10 MG TABS tablet Take 10 mg by mouth every morning.  ? finasteride (PROSCAR) 5 MG tablet TAKE 1 TABLET (5 MG TOTAL) BY MOUTH DAILY.  ? glucose blood  (ONETOUCH VERIO) test strip USE ONCE DAILY. USE AS INSTRUCTED.  ? hydrALAZINE (APRESOLINE) 50 MG tablet Take 1 tablet (50 mg total) by mouth 3 (three) times daily.  ? olmesartan (BENICAR) 40 MG tablet Take 0.5 tablets (20 mg total) by mouth daily.  ? sertraline (ZOLOFT) 50 MG tablet Take 1 tablet (50 mg total) by mouth daily.  ? tamsulosin (FLOMAX) 0.4 MG CAPS capsule TAKE 1 CAPSULE BY MOUTH EVERY DAY AFTER SUPPER  ? ?No facility-administered medications prior to visit.  ? ? ?Review of Systems  ?Constitutional:  Negative for fatigue and fever.  ?Respiratory:  Negative for cough and shortness of breath.   ?Cardiovascular:  Negative for chest pain, palpitations and leg swelling.  ?Neurological:  Negative for dizziness and headaches.  ? ? ?  Objective  ?  ?Ht 5\' 10"  (1.778 m)   Wt 180 lb 8 oz (81.9 kg)   BMI 25.90 kg/m?  ?Blood pressure (!) 118/53, pulse 72, temperature 98.6 ?F (37 ?C), temperature source Oral, height 5\' 10"  (1.778 m), weight 180 lb 8 oz (81.9 kg), SpO2 99 %.  ? ?Physical Exam ?Vitals reviewed.  ?Constitutional:   ?   General: He is awake.  ?   Appearance: He is well-developed. He is not ill-appearing.  ?HENT:  ?  Head: Normocephalic.  ?Eyes:  ?   Conjunctiva/sclera: Conjunctivae normal.  ?Cardiovascular:  ?   Rate and Rhythm: Normal rate and regular rhythm.  ?   Heart sounds: Normal heart sounds.  ?Pulmonary:  ?   Effort: Pulmonary effort is normal. No respiratory distress.  ?   Breath sounds: Normal breath sounds.  ?Skin: ?   General: Skin is warm.  ?Neurological:  ?   General: No focal deficit present.  ?   Mental Status: He is alert and oriented to person, place, and time.  ?Psychiatric:     ?   Attention and Perception: Attention normal.     ?   Mood and Affect: Mood normal.     ?   Speech: Speech normal.     ?   Behavior: Behavior normal. Behavior is cooperative.  ?  ? ?No results found for any visits on 03/24/22. ? Assessment & Plan  ?  ? ?Problem List Items Addressed This Visit   ? ?  ?  Other  ? Depression, major, single episode, severe ([place]) - Primary  ?  Continue sertraline 50 mg. ?Feels much improved. Overall mood and affect improved. ? ?  ?  ?  ? ?Will try to schedule AVW to try to coordinate specialist visits ?   ? ?I, [name], PA-C have reviewed all documentation for this visit. The documentation on  03/24/2022  for the exam, diagnosis, procedures, and orders are all accurate and complete. ? ?[name], PA-C ?[facility] ?[address] ?[place], [place], [address] ?Office: [phone] ?Fax: [phone]  ? ?[facility] ? ?

## 2022-03-24 NOTE — Assessment & Plan Note (Signed)
Continue sertraline 50 mg. ?Feels much improved. Overall mood and affect improved. ?

## 2022-03-30 ENCOUNTER — Telehealth: Payer: Self-pay

## 2022-03-30 NOTE — Telephone Encounter (Signed)
Copied from [place] [id number]. Topic: Appointment Scheduling - Scheduling Inquiry for Clinic >> Mar 30, 2022 10:12 AM Valere Dross wrote: Reason for CRM: Pt called in stating he just had a AWV appt with his insurance, and doesn't think he needs another and wanted to cancel that AWV appt he has tomorrow 05/24, please advise.

## 2022-03-31 IMAGING — US US RENAL
1 series · 14 of 25 positions shown · non-contrast
Comparison: CT 12/27/2014

CLINICAL DATA: Stage III B chronic kidney disease

EXAM:
RENAL / URINARY TRACT ULTRASOUND COMPLETE

[Series 1: us renal · 0.23mm/px · 14 of 39 slices shown]
[im 1/39]
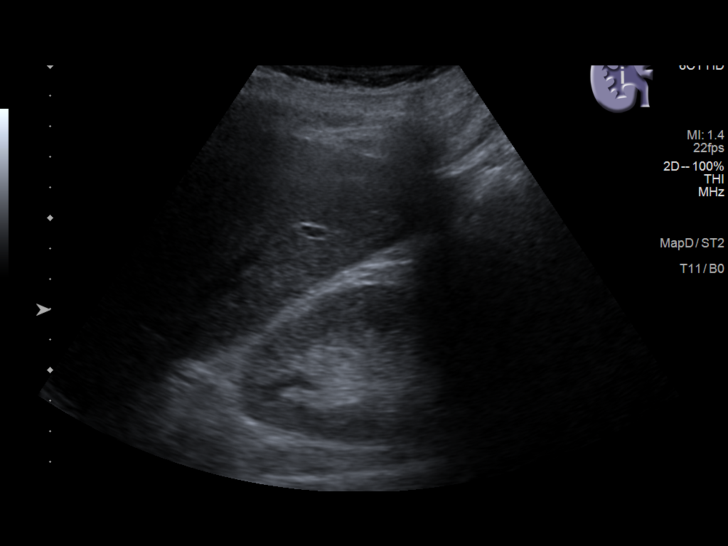
[im 4/39]
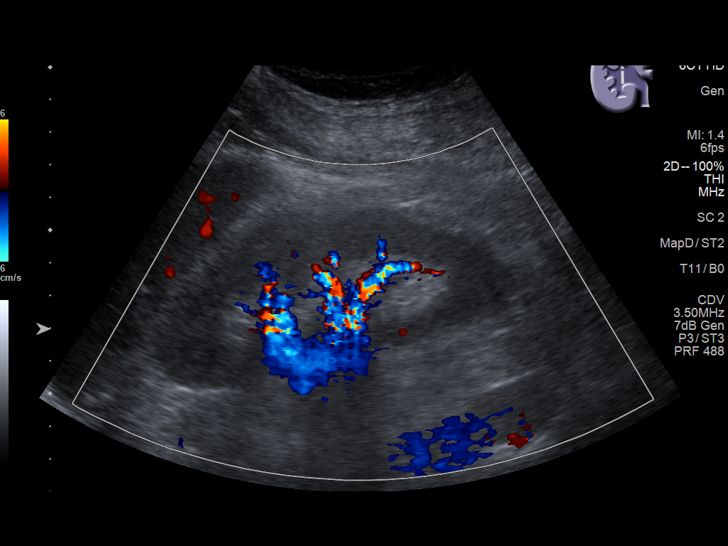
[im 7/39]
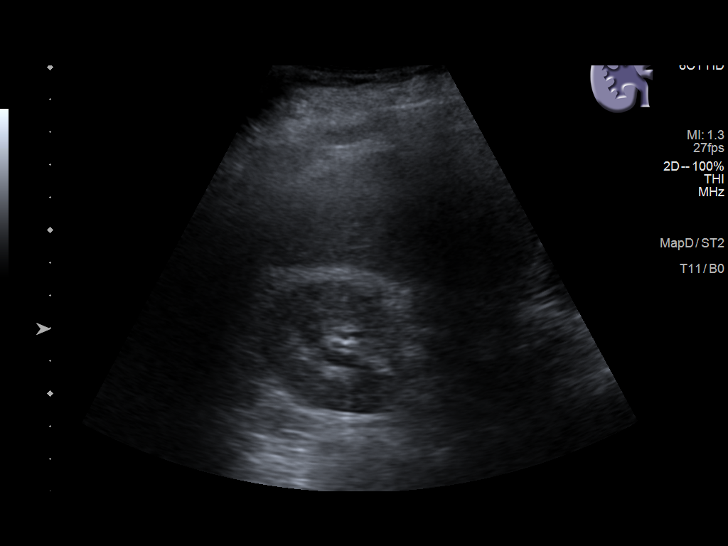
[im 10/39]
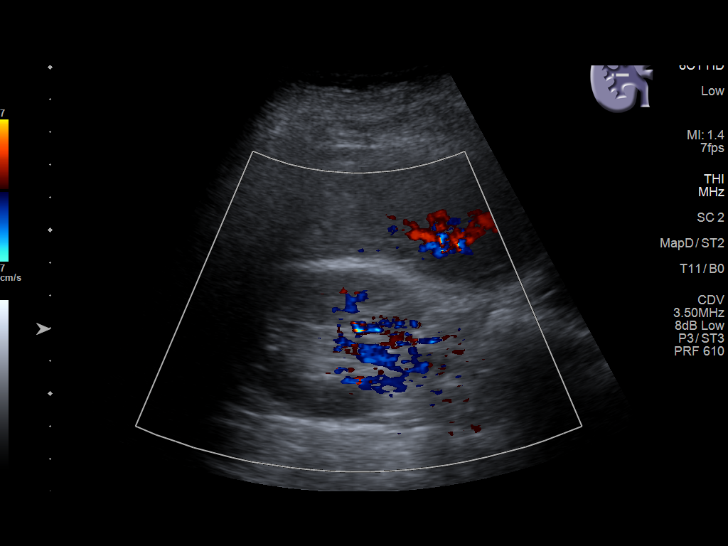
[im 13/39]
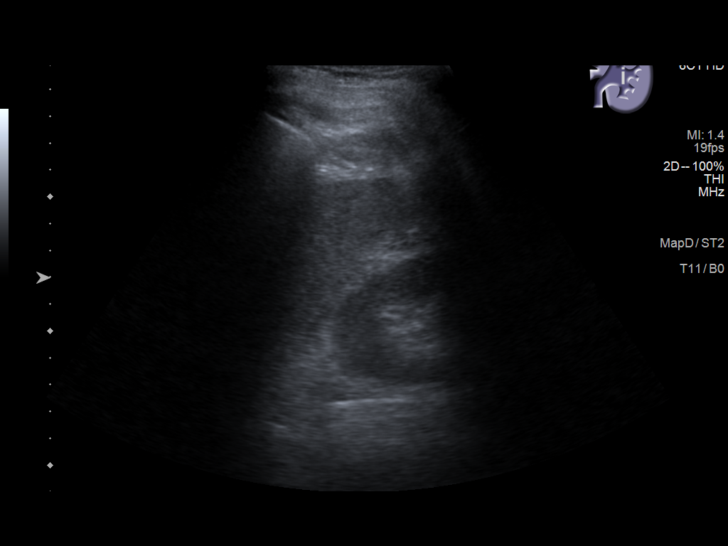
[im 15/39]
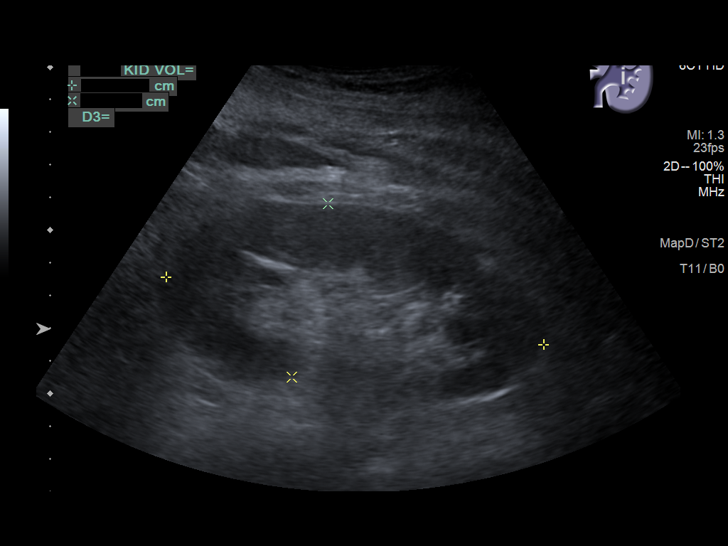
[im 18/39]
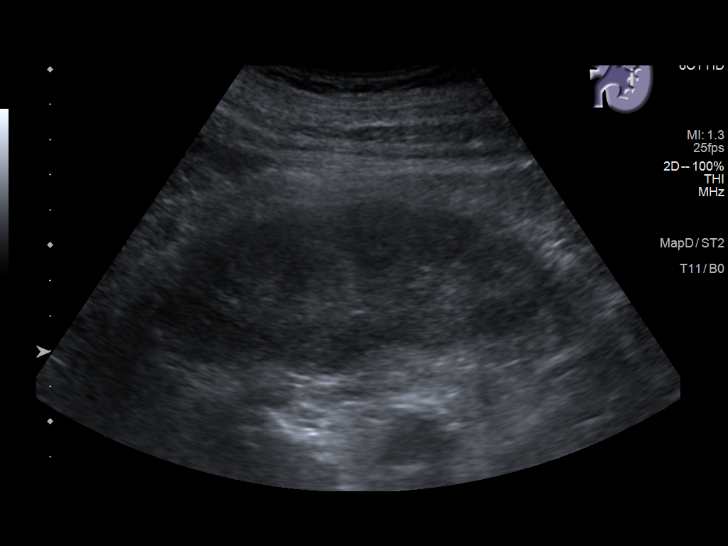
[im 21/39]
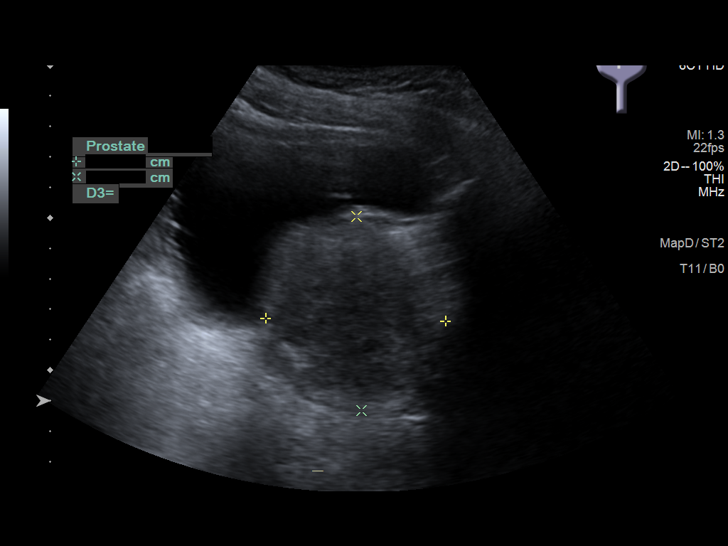
[im 24/39]
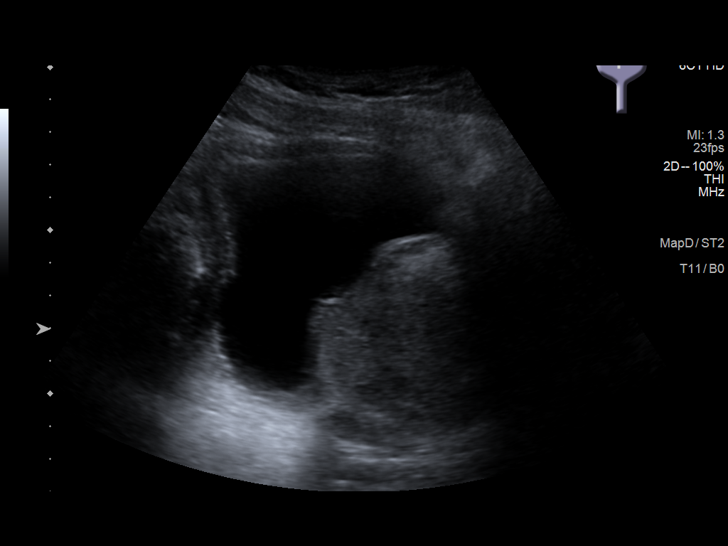
[im 26/39]
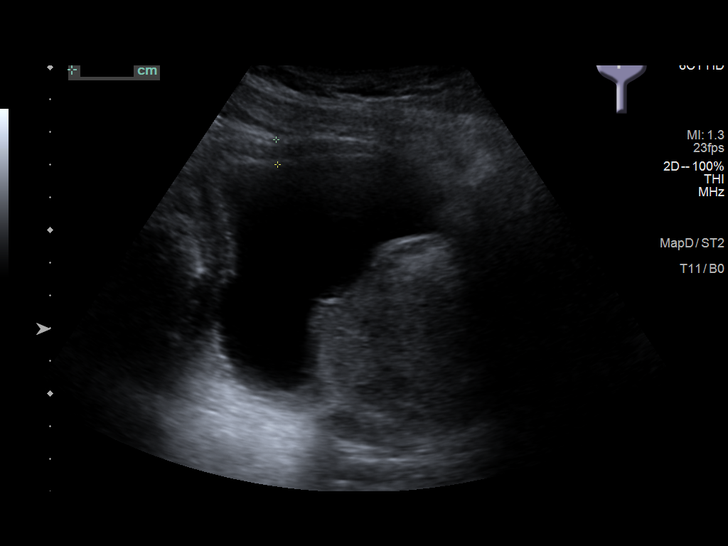
[im 29/39]
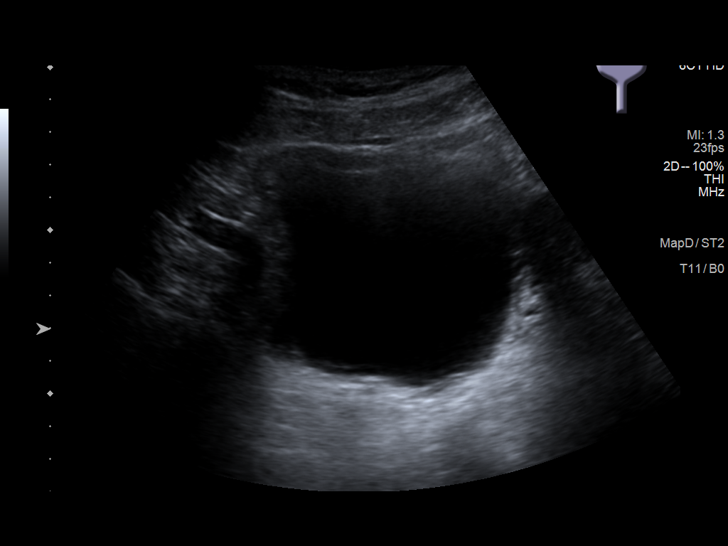
[im 32/39]
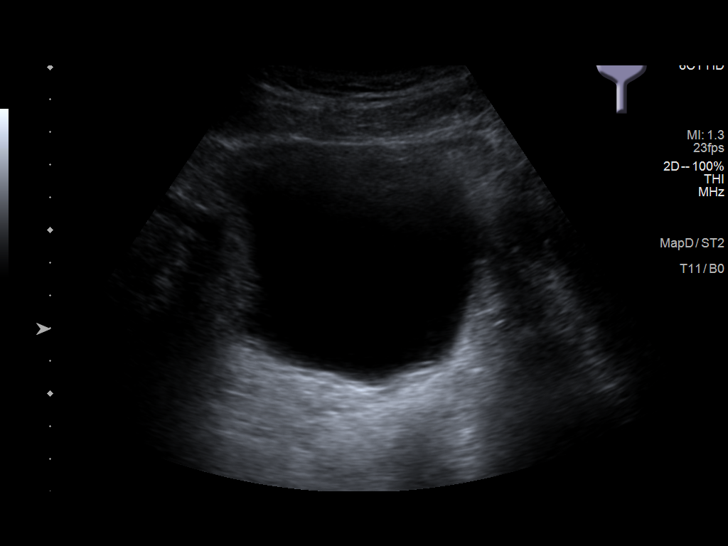
[im 35/39]
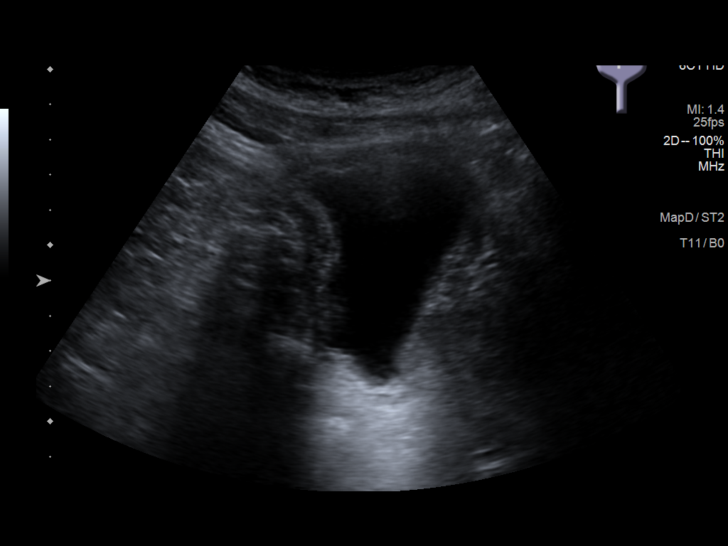
[im 39/39]
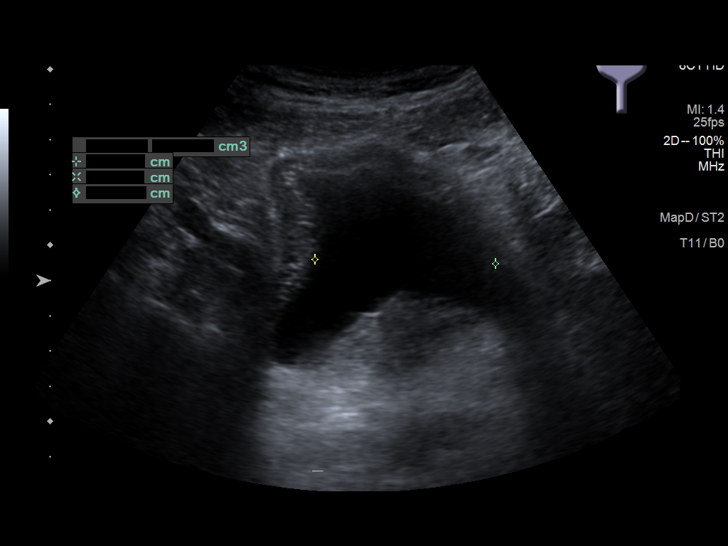

[14 of 25 positions shown; findings below may reference images not displayed]

FINDINGS: Right Kidney:

Renal measurements: 11.2 x 5.0 x 5.1 cm = volume: 148 mL.
Echogenicity within normal limits. No mass or hydronephrosis
visualized.

Left Kidney:

Renal measurements: 11.7 x 5.4 x 5.1 cm = volume: 170 mL.
Echogenicity within normal limits. No mass or hydronephrosis
visualized.

Bladder:

Mild bladder wall thickening with small postvoid residual of 69 mL.

Other:

Prostate enlargement measuring up to 6.5 cm.
IMPRESSION: No acute findings.  No hydronephrosis.

Prostate enlargement with bladder wall thickening and small postvoid
residual.

## 2022-04-16 ENCOUNTER — Ambulatory Visit: Payer: Medicare PPO | Admitting: Cardiology

## 2022-04-22 ENCOUNTER — Ambulatory Visit: Payer: Medicare PPO | Admitting: Cardiology

## 2022-05-03 DIAGNOSIS — E875 Hyperkalemia: Secondary | ICD-10-CM | POA: Diagnosis not present

## 2022-05-03 DIAGNOSIS — N400 Enlarged prostate without lower urinary tract symptoms: Secondary | ICD-10-CM | POA: Diagnosis not present

## 2022-05-03 DIAGNOSIS — E1122 Type 2 diabetes mellitus with diabetic chronic kidney disease: Secondary | ICD-10-CM | POA: Diagnosis not present

## 2022-05-03 DIAGNOSIS — R809 Proteinuria, unspecified: Secondary | ICD-10-CM | POA: Diagnosis not present

## 2022-05-03 DIAGNOSIS — K529 Noninfective gastroenteritis and colitis, unspecified: Secondary | ICD-10-CM | POA: Diagnosis not present

## 2022-05-03 DIAGNOSIS — N2581 Secondary hyperparathyroidism of renal origin: Secondary | ICD-10-CM | POA: Diagnosis not present

## 2022-05-03 DIAGNOSIS — I10 Essential (primary) hypertension: Secondary | ICD-10-CM | POA: Diagnosis not present

## 2022-05-03 DIAGNOSIS — N184 Chronic kidney disease, stage 4 (severe): Secondary | ICD-10-CM | POA: Diagnosis not present

## 2022-05-03 DIAGNOSIS — E119 Type 2 diabetes mellitus without complications: Secondary | ICD-10-CM | POA: Diagnosis not present

## 2022-05-06 DIAGNOSIS — E1122 Type 2 diabetes mellitus with diabetic chronic kidney disease: Secondary | ICD-10-CM | POA: Diagnosis not present

## 2022-05-06 DIAGNOSIS — R809 Proteinuria, unspecified: Secondary | ICD-10-CM | POA: Diagnosis not present

## 2022-05-06 DIAGNOSIS — N2581 Secondary hyperparathyroidism of renal origin: Secondary | ICD-10-CM | POA: Diagnosis not present

## 2022-05-06 DIAGNOSIS — I10 Essential (primary) hypertension: Secondary | ICD-10-CM | POA: Diagnosis not present

## 2022-05-06 DIAGNOSIS — E875 Hyperkalemia: Secondary | ICD-10-CM | POA: Diagnosis not present

## 2022-05-06 DIAGNOSIS — D631 Anemia in chronic kidney disease: Secondary | ICD-10-CM | POA: Diagnosis not present

## 2022-05-06 DIAGNOSIS — E119 Type 2 diabetes mellitus without complications: Secondary | ICD-10-CM | POA: Diagnosis not present

## 2022-05-06 DIAGNOSIS — K529 Noninfective gastroenteritis and colitis, unspecified: Secondary | ICD-10-CM | POA: Diagnosis not present

## 2022-05-06 DIAGNOSIS — N184 Chronic kidney disease, stage 4 (severe): Secondary | ICD-10-CM | POA: Diagnosis not present

## 2022-05-19 ENCOUNTER — Ambulatory Visit: Payer: Self-pay | Admitting: Urology

## 2022-05-28 ENCOUNTER — Ambulatory Visit: Payer: Medicare PPO | Admitting: Urology

## 2022-05-28 ENCOUNTER — Encounter: Payer: Self-pay | Admitting: Urology

## 2022-05-28 VITALS — BP 121/49 | HR 65 | Ht 70.0 in | Wt 180.0 lb

## 2022-05-28 DIAGNOSIS — R972 Elevated prostate specific antigen [PSA]: Secondary | ICD-10-CM | POA: Diagnosis not present

## 2022-05-28 DIAGNOSIS — N401 Enlarged prostate with lower urinary tract symptoms: Secondary | ICD-10-CM

## 2022-05-28 LAB — BLADDER SCAN AMB NON-IMAGING: Scan Result: 36

## 2022-05-28 LAB — URINALYSIS, COMPLETE
Bilirubin, UA: NEGATIVE
Ketones, UA: NEGATIVE
Nitrite, UA: NEGATIVE
RBC, UA: NEGATIVE
Specific Gravity, UA: 1.015 (ref 1.005–1.030)
Urobilinogen, Ur: 0.2 mg/dL (ref 0.2–1.0)
pH, UA: 5 (ref 5.0–7.5)

## 2022-05-28 LAB — MICROSCOPIC EXAMINATION: Bacteria, UA: NONE SEEN

## 2022-05-28 NOTE — Progress Notes (Unsigned)
05/28/2022 2:23 PM   Nicholas Nolan May 05, 1951 [id number]  Referring provider: Mar Daring, PA-C [address],  [place] [address]  Chief Complaint  Patient presents with   Benign Prostatic Hypertrophy    Urologic history: 1.  Elevated PSA -Prostate biopsy 2010 PSA 5.4; volume 64 cc; benign -PSA 8.3 05/2016; MRI 103 cc volume, PI-RADS 3  -MR fusion biopsy 08/2016, 82 cc volume, ROI and 12 core template benign -PSA rise 16.8 10/2019; repeat PSA 12/2019 remains elevated 16.3 -Repeat MRI 128 cc gland; contrast not given secondary to GFR 2 "intermediate suspicious lesions" seen in left PZ with mild capsular bulging and 2 other lesions NTZ -MR fusion biopsy 02/2020; 139 cc gland; 4 biopsies each were taken of 3 ROI lesions in addition to a standard 12 core biopsy; all cores with benign prostate tissue and foci of acute/chronic inflammation   2.  BPH with history urinary retention -Successful voiding trial -On tamsulosin/finasteride   HPI: 71 y.o. male presents for annual follow-up.  Doing well since last visit Stable LUTS on tamsulosin/finasteride Denies dysuria, gross hematuria Denies flank, abdominal or pelvic pain   PMH: Past Medical History:  Diagnosis Date   Arthritis    hands   Back pain    leg pain   CAD (coronary artery disease)    a. 10/2019 Cor CTA: LM nl, LAD nl, LCX distal dzs w/ FFR 0.65-0.75. RCA nl-->Med Rx.   CKD (chronic kidney disease), stage III (HCC)    Claudication (HCC)    Diabetes mellitus    type 2   Diastolic dysfunction    a. 08/2019 Echo: EF 60-65%, mild LVH, impaired relaxation. Nl RV size/fxn. Nl LA szie.   History of urinary retention    Hyperlipidemia    Hypertension    a. 05/2019 renal u/s concerning for R RAS; b. 06/2019 Renal artery angio: no more than15% bilat RAS.   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   RUL Lobectomy   Wears dentures    full upper     Surgical History: Past Surgical History:  Procedure Laterality Date   BASAL CELL CARCINOMA EXCISION     COLONOSCOPY WITH PROPOFOL N/A 12/25/2019   Procedure: COLONOSCOPY WITH PROPOFOL;  Surgeon: Lin Landsman, MD;  Location: [facility];  Service: Endoscopy;  Laterality: N/A;  Diabetic - injectable and oral meds   COLONOSCOPY WITH PROPOFOL N/A 10/08/2021   Procedure: COLONOSCOPY WITH PROPOFOL;  Surgeon: [name], MD;  [facility] ENDOSCOPY;  Service: Gastroenterology;  Laterality: N/A;   LUMBAR LAMINECTOMY/DECOMPRESSION MICRODISCECTOMY  01/10/2012   Procedure: LUMBAR LAMINECTOMY/DECOMPRESSION MICRODISCECTOMY 2 LEVELS;  Surgeon: Hosie Spangle, MD;  Location: [facility] NEURO ORS;  Service: Neurosurgery;  Laterality: Bilateral;  Lumbar four-Sacral One laminectomies   LUNG SURGERY Right 11/22/12   right upper lobe   POLYPECTOMY N/A 12/25/2019   Procedure: POLYPECTOMY;  Surgeon: Lin Landsman, MD;  Location: [facility];  Service: Endoscopy;  Laterality: N/A;   PROSTATE SURGERY     biopsy-due to elelvated PSA   RENAL ANGIOGRAPHY Right 07/02/2019   Procedure: RENAL ANGIOGRAPHY;  Surgeon: Algernon Huxley, MD;  Location: [facility] CV LAB;  Service: Cardiovascular;  Laterality: Right;   SHOULDER SURGERY     VASECTOMY      Home Medications:  Allergies as of 05/28/2022       Reactions   Codeine Itching   And hyper also  Hydromorphone Itching   Morphine And Related Itching        Medication List        Accurate as of May 28, 2022  2:23 PM. If you have any questions, ask your nurse or doctor.          amLODipine 10 MG tablet Commonly known as: NORVASC Take 1 tablet (10 mg total) by mouth daily.   aspirin EC 81 MG tablet Take 1 tablet (81 mg total) by mouth daily. Swallow whole.   atorvastatin 80 MG tablet Commonly known as: LIPITOR Take 1 tablet (80 mg total) by mouth daily.   carvedilol 6.25 MG tablet Commonly known as: COREG Take 1 tablet  (6.25 mg total) by mouth 2 (two) times daily.   cilostazol 50 MG tablet Commonly known as: PLETAL Take 1 tablet (50 mg total) by mouth 2 (two) times daily.   Dulaglutide 1.5 MG/0.5ML Sopn Inject 1.5 mg into the skin once a week.   Trulicity 1.5 XA/1.2IN Sopn Generic drug: Dulaglutide Inject into the skin.   Farxiga 10 MG Tabs tablet Generic drug: dapagliflozin propanediol Take 10 mg by mouth every morning.   finasteride 5 MG tablet Commonly known as: PROSCAR TAKE 1 TABLET (5 MG TOTAL) BY MOUTH DAILY.   hydrALAZINE 50 MG tablet Commonly known as: APRESOLINE Take 1 tablet (50 mg total) by mouth 3 (three) times daily.   olmesartan 20 MG tablet Commonly known as: BENICAR Take by mouth.   olmesartan 20 MG tablet Commonly known as: BENICAR Take by mouth.   olmesartan 40 MG tablet Commonly known as: BENICAR Take 0.5 tablets (20 mg total) by mouth daily.   OneTouch Verio test strip Generic drug: glucose blood USE ONCE DAILY. USE AS INSTRUCTED.   sertraline 50 MG tablet Commonly known as: ZOLOFT Take 1 tablet (50 mg total) by mouth daily.   tamsulosin 0.4 MG Caps capsule Commonly known as: FLOMAX TAKE 1 CAPSULE BY MOUTH EVERY DAY AFTER SUPPER        Allergies:  Allergies  Allergen Reactions   Codeine Itching    And hyper also   Hydromorphone Itching   Morphine And Related Itching    Family History: Family History  Problem Relation Age of Onset   Cancer Father        prostate   Heart disease Father        MI   Stroke Father    Cancer Brother        liver    Social History:  reports that he quit smoking about 9 years ago. His smoking use included cigarettes. He has a 90.00 pack-year smoking history. His smokeless tobacco use includes snuff. He reports that he does not drink alcohol and does not use drugs.   Physical Exam: BP (!) 121/49   Pulse 65   Ht 5\' 10"  (1.778 m)   Wt 180 lb (81.6 kg)   BMI 25.83 kg/m   Constitutional:  Alert and oriented,  No acute distress. HEENT: [place] AT, moist mucus membranes.  Trachea midline, no masses. Cardiovascular: No clubbing, cyanosis, or edema. Respiratory: Normal respiratory effort, no increased work of breathing. GI: Abdomen is soft, nontender, nondistended, no abdominal masses   Assessment & Plan:    1. Benign prostatic hyperplasia with LUTS Stable PVR today 36 mL Continue tamsulosin/finasteride  2.  Elevated PSA PSA drawn today and if stable lab visit for PSA 6 months Will expect ~ 50% reduction in PSA due to finasteride Follow-up 6 months  Abbie Sons, [place] [address], [place], [place] [address] [phone]

## 2022-05-29 ENCOUNTER — Encounter: Payer: Self-pay | Admitting: Urology

## 2022-05-29 LAB — PSA: Prostate Specific Ag, Serum: 8.4 ng/mL — ABNORMAL HIGH (ref 0.0–4.0)

## 2022-05-31 ENCOUNTER — Encounter: Payer: Self-pay | Admitting: *Deleted

## 2022-06-03 DIAGNOSIS — E1159 Type 2 diabetes mellitus with other circulatory complications: Secondary | ICD-10-CM | POA: Diagnosis not present

## 2022-06-03 DIAGNOSIS — E782 Mixed hyperlipidemia: Secondary | ICD-10-CM | POA: Diagnosis not present

## 2022-06-03 DIAGNOSIS — I10 Essential (primary) hypertension: Secondary | ICD-10-CM | POA: Diagnosis not present

## 2022-06-03 DIAGNOSIS — N184 Chronic kidney disease, stage 4 (severe): Secondary | ICD-10-CM | POA: Diagnosis not present

## 2022-06-03 DIAGNOSIS — E1122 Type 2 diabetes mellitus with diabetic chronic kidney disease: Secondary | ICD-10-CM | POA: Diagnosis not present

## 2022-06-03 DIAGNOSIS — Z794 Long term (current) use of insulin: Secondary | ICD-10-CM | POA: Diagnosis not present

## 2022-06-15 ENCOUNTER — Telehealth: Payer: Self-pay | Admitting: Physician Assistant

## 2022-06-15 NOTE — Telephone Encounter (Signed)
Copied from [place] [id number]. Topic: Medicare AWV >> Jun 15, 2022 12:23 PM Jae Dire wrote: Reason for CRM:  Left message for patient to call back and schedule Medicare Annual Wellness Visit (AWV) in office.   If unable to come into the office for AWV,  please offer to do virtually or by telephone.  Last AWV: 12/31/2020  Please schedule at anytime with [facility] Health Advisor.  30 minute appointment for Virtual or phone 45 minute appointment for in office or Initial virtual/phone  Any questions, please contact me at [phone]

## 2022-06-17 ENCOUNTER — Encounter: Payer: Self-pay | Admitting: Cardiology

## 2022-06-17 ENCOUNTER — Ambulatory Visit: Payer: Medicare PPO | Admitting: Cardiology

## 2022-06-17 VITALS — BP 140/48 | HR 68 | Ht 69.0 in | Wt 179.5 lb

## 2022-06-17 DIAGNOSIS — I2510 Atherosclerotic heart disease of native coronary artery without angina pectoris: Secondary | ICD-10-CM | POA: Diagnosis not present

## 2022-06-17 DIAGNOSIS — I10 Essential (primary) hypertension: Secondary | ICD-10-CM | POA: Diagnosis not present

## 2022-06-17 NOTE — Patient Instructions (Signed)
Medication Instructions:   Your physician recommends that you continue on your current medications as directed. Please refer to the Current Medication list given to you today.  *If you need a refill on your cardiac medications before your next appointment, please call your pharmacy*   Lab Work:  None ordered  Testing/Procedures:  None ordered   Follow-Up: At [facility], you and your health needs are our priority.  As part of our continuing mission to provide you with exceptional heart care, we have created designated Provider Care Teams.  These Care Teams include your primary Cardiologist (physician) and Advanced Practice Providers (APPs -  Physician Assistants and Nurse Practitioners) who all work together to provide you with the care you need, when you need it.  We recommend signing up for the patient portal called "MyChart".  Sign up information is provided on this After Visit Summary.  MyChart is used to connect with patients for Virtual Visits (Telemedicine).  Patients are able to view lab/test results, encounter notes, upcoming appointments, etc.  Non-urgent messages can be sent to your provider as well.   To learn more about what you can do with MyChart, go to [email or web address].    Your next appointment:   6 month(s)  The format for your next appointment:   In Person  Provider:   You may see Kate Sable, MD or one of the following Advanced Practice Providers on your designated Care Team:   Murray Hodgkins, NP Christell Faith, PA-C Cadence Kathlen Mody, [place]    Important Information About Sugar

## 2022-06-17 NOTE — Progress Notes (Signed)
Cardiology Office Note:    Date:  06/17/2022   ID:  Nicholas Nolan, DOB Sep 13, 1951, MRN [id number]  PCP:  [name], PA-C  Cardiologist:  Kate Sable, MD  Electrophysiologist:  None   Referring MD: Florian Buff*   Chief Complaint  Patient presents with   6 week follow up     "Doing well." Medications reviewed by the patient verbally.     History of Present Illness:    Nicholas Nolan is a 71 y.o. male with a hx of CAD (CCTA 10/2019-Ca score 1689, mild LAD, LCx disease, FFRct with no sig obstruction), PAD, CKD, hypertension, diabetes, hyperlipidemia, former smoker x40+ years who presents for follow-up.  Previously seen for elevated blood pressures, hydralazine titrated to 50 mg 3 times daily.  States feeling dizzy initially but currently tolerating medication.  Denies chest pain or shortness of breath, compliant with all medications as prescribed.  Has no concerns at this time.   Prior notes  Renal ultrasound and renal angiogram were performed 06/2019 to evaluate for possible secondary causes but that was normal  Echo 08/2019 showed normal systolic function, EF 60 to 65%, impaired relaxation, mild LVH. PAD, evaluated by interventional cardiology, angio being deferred for now due to risk of CIN   Past Medical History:  Diagnosis Date   Arthritis    hands   Back pain    leg pain   CAD (coronary artery disease)    a. 10/2019 Cor CTA: LM nl, LAD nl, LCX distal dzs w/ FFR 0.65-0.75. RCA nl-->Med Rx.   CKD (chronic kidney disease), stage III (HCC)    Claudication (HCC)    Diabetes mellitus    type 2   Diastolic dysfunction    a. 08/2019 Echo: EF 60-65%, mild LVH, impaired relaxation. Nl RV size/fxn. Nl LA szie.   History of urinary retention    Hyperlipidemia    Hypertension    a. 05/2019 renal u/s concerning for R RAS; b. 06/2019 Renal artery angio: no more than15% bilat RAS.   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   RUL Lobectomy   Wears dentures    full upper    Past Surgical History:  Procedure Laterality Date   BASAL CELL CARCINOMA EXCISION     COLONOSCOPY WITH PROPOFOL N/A 12/25/2019   Procedure: COLONOSCOPY WITH PROPOFOL;  Surgeon: Lin Landsman, MD;  Location: [facility];  Service: Endoscopy;  Laterality: N/A;  Diabetic - injectable and oral meds   COLONOSCOPY WITH PROPOFOL N/A 10/08/2021   Procedure: COLONOSCOPY WITH PROPOFOL;  Surgeon: [name], MD;  Location: [facility] ENDOSCOPY;  Service: Gastroenterology;  Laterality: N/A;   LUMBAR LAMINECTOMY/DECOMPRESSION MICRODISCECTOMY  01/10/2012   Procedure: LUMBAR LAMINECTOMY/DECOMPRESSION MICRODISCECTOMY 2 LEVELS;  Surgeon: Hosie Spangle, MD;  Location: [place] NEURO ORS;  Service: Neurosurgery;  Laterality: Bilateral;  Lumbar four-Sacral One laminectomies   LUNG SURGERY Right 11/22/12   right upper lobe   POLYPECTOMY N/A 12/25/2019   Procedure: POLYPECTOMY;  Surgeon: Lin Landsman, MD;  Location: [place];  Service: Endoscopy;  Laterality: N/A;   PROSTATE SURGERY     biopsy-due to elelvated PSA   RENAL ANGIOGRAPHY Right 07/02/2019   Procedure: RENAL ANGIOGRAPHY;  Surgeon: Algernon Huxley, MD;  Location: [place] CV [facility];  Service: Cardiovascular;  Laterality: Right;   SHOULDER SURGERY     VASECTOMY      Current Medications: Current Meds  Medication  Sig   amLODipine (NORVASC) 10 MG tablet Take 1 tablet (10 mg total) by mouth daily.   aspirin EC 81 MG tablet Take 1 tablet (81 mg total) by mouth daily. Swallow whole.   atorvastatin (LIPITOR) 80 MG tablet Take 1 tablet (80 mg total) by mouth daily.   carvedilol (COREG) 6.25 MG tablet Take 1 tablet (6.25 mg total) by mouth 2 (two) times daily.   cilostazol (PLETAL) 50 MG tablet Take 1 tablet (50 mg total) by mouth 2 (two) times daily.   Dulaglutide (TRULICITY) 1.5 VC/9.4WH SOPN Inject into the skin.   FARXIGA 10 MG TABS tablet Take 10 mg by mouth every morning.    finasteride (PROSCAR) 5 MG tablet TAKE 1 TABLET (5 MG TOTAL) BY MOUTH DAILY.   hydrALAZINE (APRESOLINE) 50 MG tablet Take 1 tablet (50 mg total) by mouth 3 (three) times daily.   sertraline (ZOLOFT) 50 MG tablet Take 1 tablet (50 mg total) by mouth daily.   tamsulosin (FLOMAX) 0.4 MG CAPS capsule TAKE 1 CAPSULE BY MOUTH EVERY DAY AFTER SUPPER     Allergies:   Codeine, Hydromorphone, and Morphine and related   Social History   Socioeconomic History   Marital status: Married    Spouse name: Not on file   Number of children: 4   Years of education: Not on file   Highest education level: Associate degree: occupational, Hotel manager, or vocational program  Occupational History   Occupation: retired  Tobacco Use   Smoking status: Former    Packs/day: 2.00    Years: 45.00    Total pack years: 90.00    Types: Cigarettes    Quit date: 11/28/2012    Years since quitting: 9.5   Smokeless tobacco: Current    Types: Snuff   Tobacco comments:    Quit in 2014  Vaping Use   Vaping Use: Never used  Substance and Sexual Activity   Alcohol use: No   Drug use: No   Sexual activity: Yes    Birth control/protection: None  Other Topics Concern   Not on file  Social History Narrative   Not on file   Social Determinants of Health   Financial Resource Strain: Low Risk  (02/25/2022)   Overall Financial Resource Strain (CARDIA)    Difficulty of Paying Living Expenses: Not hard at all  Food Insecurity: No Food Insecurity (02/25/2022)   Hunger Vital Sign    Worried About Running Out of Food in the Last Year: Never true    [place] in the Last Year: Never true  Transportation Needs: Unmet Transportation Needs (02/25/2022)   PRAPARE - Transportation    Lack of Transportation (Medical): Yes    Lack of Transportation (Non-Medical): Yes  Physical Activity: Inactive (12/31/2020)   Exercise Vital Sign    Days of Exercise per Week: 0 days    Minutes of Exercise per Session: 0 min  Stress: Stress  Concern Present (02/25/2022)   [place]    Feeling of Stress : To some extent  Social Connections: Moderately Isolated (02/25/2022)   Social Connection and Isolation Panel [NHANES]    Frequency of Communication with Friends and Family: More than three times a week    Frequency of Social Gatherings with Friends and Family: More than three times a week    Attends Religious Services: Never    Marine scientist or Organizations: No    Attends Archivist Meetings: Never  Marital Status: Married     Family History: The patient's family history includes Cancer in his brother and father; Heart disease in his father; Stroke in his father.  ROS:   Please see the history of present illness.     All other systems reviewed and are negative.  EKGs/Labs/Other Studies Reviewed:    The following studies were reviewed today:   EKG:  EKG is ordered today.  EKG shows normal sinus rhythm, normal ECG.  Recent Labs: No results found for requested labs within last 365 days.  Recent Lipid Panel    Component Value Date/Time   CHOL 126 08/21/2020 1246   CHOL 221 (H) 10/19/2019 1046   TRIG 89 08/21/2020 1246   HDL 48 08/21/2020 1246   HDL 43 10/19/2019 1046   CHOLHDL 2.6 08/21/2020 1246   VLDL 18 08/21/2020 1246   LDLCALC 60 08/21/2020 1246   LDLCALC 150 (H) 10/19/2019 1046    Physical Exam:    VS:  BP (!) 140/48 (BP Location: Left Arm, Patient Position: Sitting, Cuff Size: Normal)   Pulse 68   Ht 5\' 9"  (1.753 m)   Wt 179 lb 8 oz (81.4 kg)   SpO2 97%   BMI 26.51 kg/m     Wt Readings from Last 3 Encounters:  06/17/22 179 lb 8 oz (81.4 kg)  05/28/22 180 lb (81.6 kg)  03/24/22 180 lb 8 oz (81.9 kg)     GEN:  Well nourished, well developed in no acute distress HEENT: Normal NECK: No JVD; bilateral carotid bruit noted worse on the right. CARDIAC: RRR, no murmurs, rubs, gallops RESPIRATORY:  Clear to  auscultation without rales, wheezing or rhonchi  ABDOMEN: Soft, non-tender, non-distended MUSCULOSKELETAL:  No edema; No deformity, extremities appear warm, DP difficult to palpate SKIN: Warm and dry NEUROLOGIC:  Alert and oriented x 3 PSYCHIATRIC:  Normal affect   ASSESSMENT:    1. Primary hypertension   2. Coronary artery disease involving native coronary artery of native heart without angina pectoris     PLAN:    In order of problems listed above:  Hypertension, BP systolic elevated, usually controlled.  Continue hydralazine 50 mg 3 times daily,Benicar 20 mg daily, Norvasc 10 mg, Coreg 6.25 mg twice daily.  nonobstructive CAD in LAD and RCA(Ca2+ 1698, FFR CT no stenosis).  Patient is asymptomatic.  LDL at goal.  Continue aspirin, Lipitor, beta-blocker.  Consider left heart cath if patient becomes symptomatic.  EF 60 to 65%  Follow-up in 6 months.  Medication Adjustments/Labs and Tests Ordered: Current medicines are reviewed at length with the patient today.  Concerns regarding medicines are outlined above.  Orders Placed This Encounter  Procedures   EKG 12-Lead   No orders of the defined types were placed in this encounter.   Patient Instructions  Medication Instructions:   Your physician recommends that you continue on your current medications as directed. Please refer to the Current Medication list given to you today.  *If you need a refill on your cardiac medications before your next appointment, please call your pharmacy*   Lab Work:  None ordered  Testing/Procedures:  None ordered   Follow-Up: At [facility], you and your health needs are our priority.  As part of our continuing mission to provide you with exceptional heart care, we have created designated Provider Care Teams.  These Care Teams include your primary Cardiologist (physician) and Advanced Practice Providers (APPs -  Physician Assistants and Nurse Practitioners) who all work together to provide  you with the care you need, when you need it.  We recommend signing up for the patient portal called "MyChart".  Sign up information is provided on this After Visit Summary.  MyChart is used to connect with patients for Virtual Visits (Telemedicine).  Patients are able to view lab/test results, encounter notes, upcoming appointments, etc.  Non-urgent messages can be sent to your provider as well.   To learn more about what you can do with MyChart, go to [email or web address].    Your next appointment:   6 month(s)  The format for your next appointment:   In Person  Provider:   You may see Kate Sable, MD or one of the following Advanced Practice Providers on your designated Care Team:   Murray Hodgkins, NP Christell Faith, PA-C Cadence Kathlen Mody, [place]    Important Information About Sugar         Signed, Kate Sable, MD  06/17/2022 12:13 PM    [place]

## 2022-08-03 ENCOUNTER — Other Ambulatory Visit: Payer: Self-pay | Admitting: Physician Assistant

## 2022-08-03 DIAGNOSIS — F322 Major depressive disorder, single episode, severe without psychotic features: Secondary | ICD-10-CM

## 2022-08-09 ENCOUNTER — Encounter (INDEPENDENT_AMBULATORY_CARE_PROVIDER_SITE_OTHER): Payer: Medicare PPO

## 2022-08-09 ENCOUNTER — Ambulatory Visit (INDEPENDENT_AMBULATORY_CARE_PROVIDER_SITE_OTHER): Payer: Medicare PPO | Admitting: Vascular Surgery

## 2022-08-13 ENCOUNTER — Encounter: Payer: Self-pay | Admitting: Physician Assistant

## 2022-08-13 ENCOUNTER — Ambulatory Visit: Payer: Medicare PPO | Admitting: Physician Assistant

## 2022-08-13 VITALS — BP 139/41 | HR 67 | Ht 70.0 in | Wt 185.0 lb

## 2022-08-13 DIAGNOSIS — E119 Type 2 diabetes mellitus without complications: Secondary | ICD-10-CM | POA: Diagnosis not present

## 2022-08-13 DIAGNOSIS — G2581 Restless legs syndrome: Secondary | ICD-10-CM | POA: Diagnosis not present

## 2022-08-13 NOTE — Assessment & Plan Note (Addendum)
Etiology may be restless leg, anxiety, referred pain from lumbar spine Will order iron panel, cbc, cmp to r/o iron def before prescribing Advised we could start with gabapentin at night to help if labs normal

## 2022-08-13 NOTE — Progress Notes (Signed)
I,Sha'taria Tyson,acting as a Education administrator for [employer], PA-C.,have documented all relevant documentation on the behalf of [name], PA-C,as directed by  [name], PA-C while in the presence of [name], PA-C.   Established patient visit   Patient: Nicholas Nolan   DOB: 1951-09-25   71 y.o. Male  MRN: [id number] Visit Date: 08/13/2022  Today's healthcare provider: [name], PA-C   Cc. Restless leg  Subjective    Pt reports having tingling in his legs, jerking movements when trying to sleep. Reports this has been happening for years but now is every night. Denies any history of medications. Reports tingling at the tops of his thighs when sitting, but this resolved when standing/walking .  Medications: Outpatient Medications Prior to Visit  Medication Sig   amLODipine (NORVASC) 10 MG tablet Take 1 tablet (10 mg total) by mouth daily.   carvedilol (COREG) 6.25 MG tablet Take 1 tablet (6.25 mg total) by mouth 2 (two) times daily.   cilostazol (PLETAL) 50 MG tablet Take 1 tablet (50 mg total) by mouth 2 (two) times daily.   Dulaglutide (TRULICITY) 1.5 NO/6.7EH SOPN Inject into the skin.   Dulaglutide 1.5 MG/0.5ML SOPN Inject 1.5 mg into the skin once a week.   FARXIGA 10 MG TABS tablet Take 10 mg by mouth every morning.   finasteride (PROSCAR) 5 MG tablet TAKE 1 TABLET (5 MG TOTAL) BY MOUTH DAILY.   glucose blood (ONETOUCH VERIO) test strip    sertraline (ZOLOFT) 50 MG tablet TAKE 1 TABLET BY MOUTH EVERY DAY   aspirin EC 81 MG tablet Take 1 tablet (81 mg total) by mouth daily. Swallow whole. (Patient not taking: Reported on 08/13/2022)   tamsulosin (FLOMAX) 0.4 MG CAPS capsule TAKE 1 CAPSULE BY MOUTH EVERY DAY AFTER SUPPER (Patient not taking: Reported on 08/13/2022)   [DISCONTINUED] atorvastatin (LIPITOR) 80 MG tablet Take 1 tablet (80 mg total) by mouth daily. (Patient not taking: Reported on 08/13/2022)   [DISCONTINUED] hydrALAZINE (APRESOLINE) 50 MG tablet  Take 1 tablet (50 mg total) by mouth 3 (three) times daily. (Patient not taking: Reported on 08/13/2022)   No facility-administered medications prior to visit.    Review of Systems  Psychiatric/Behavioral:  The patient has insomnia.      Objective    Blood pressure (!) 139/41, pulse 67, height 5\' 10"  (1.778 m), weight 185 lb (83.9 kg), SpO2 100 %.   Physical Exam Vitals reviewed.  Constitutional:      Appearance: He is not ill-appearing.  HENT:     Head: Normocephalic.  Eyes:     Conjunctiva/sclera: Conjunctivae normal.  Cardiovascular:     Rate and Rhythm: Normal rate.  Pulmonary:     Effort: Pulmonary effort is normal. No respiratory distress.  Neurological:     General: No focal deficit present.     Mental Status: He is alert and oriented to person, place, and time.  Psychiatric:        Mood and Affect: Mood normal.        Behavior: Behavior normal.      No results found for any visits on 08/13/22.  Assessment & Plan     Problem List Items Addressed This Visit       Other   Restless leg - Primary    Will order iron panel, cbc, cmp to r/o iron def before prescribing Advised we could start with gabapentin at night to help if labs normal Etiology may be restless leg, anxiety,  referred pain from low back.        Relevant Orders   Iron, TIBC and Ferritin Panel   CBC w/Diff/Platelet   Vitamin B12   Comprehensive Metabolic Panel (CMET)    Return if symptoms worsen or fail to improve, f/u 2-4 mo for awv.      I, [name], PA-C have reviewed all documentation for this visit. The documentation on  08/13/2022  for the exam, diagnosis, procedures, and orders are all accurate and complete.  [name], PA-C [facility] [address] [place], [place], [address] Office: [phone] Fax: [place]

## 2022-08-14 LAB — IRON,TIBC AND FERRITIN PANEL
Ferritin: 28 ng/mL — ABNORMAL LOW (ref 30–400)
Iron Saturation: 23 % (ref 15–55)
Iron: 67 ug/dL (ref 38–169)
Total Iron Binding Capacity: 297 ug/dL (ref 250–450)
UIBC: 230 ug/dL (ref 111–343)

## 2022-08-14 LAB — COMPREHENSIVE METABOLIC PANEL
ALT: 8 IU/L (ref 0–44)
AST: 9 IU/L (ref 0–40)
Albumin/Globulin Ratio: 1.8 (ref 1.2–2.2)
Albumin: 4 g/dL (ref 3.8–4.8)
Alkaline Phosphatase: 85 IU/L (ref 44–121)
BUN/Creatinine Ratio: 17 (ref 10–24)
BUN: 49 mg/dL — ABNORMAL HIGH (ref 8–27)
Bilirubin Total: <0.2 mg/dL (ref 0.0–1.2)
CO2: 20 mmol/L (ref 20–29)
Calcium: 8.3 mg/dL — ABNORMAL LOW (ref 8.6–10.2)
Chloride: 104 mmol/L (ref 96–106)
Creatinine, Ser: 2.84 mg/dL — ABNORMAL HIGH (ref 0.76–1.27)
Globulin, Total: 2.2 g/dL (ref 1.5–4.5)
Glucose: 243 mg/dL — ABNORMAL HIGH (ref 70–99)
Potassium: 4.4 mmol/L (ref 3.5–5.2)
Sodium: 138 mmol/L (ref 134–144)
Total Protein: 6.2 g/dL (ref 6.0–8.5)
eGFR: 23 mL/min/{1.73_m2} — ABNORMAL LOW (ref >59)

## 2022-08-14 LAB — CBC WITH DIFFERENTIAL/PLATELET
Basophils Absolute: 0.1 10*3/uL (ref 0.0–0.2)
Basos: 1 %
EOS (ABSOLUTE): 0.1 10*3/uL (ref 0.0–0.4)
Eos: 3 %
Hematocrit: 28.8 % — ABNORMAL LOW (ref 37.5–51.0)
Hemoglobin: 9.7 g/dL — ABNORMAL LOW (ref 13.0–17.7)
Immature Grans (Abs): 0 10*3/uL (ref 0.0–0.1)
Immature Granulocytes: 0 %
Lymphocytes Absolute: 1.4 10*3/uL (ref 0.7–3.1)
Lymphs: 34 %
MCH: 30.4 pg (ref 26.6–33.0)
MCHC: 33.7 g/dL (ref 31.5–35.7)
MCV: 90 fL (ref 79–97)
Monocytes Absolute: 0.3 10*3/uL (ref 0.1–0.9)
Monocytes: 6 %
Neutrophils Absolute: 2.4 10*3/uL (ref 1.4–7.0)
Neutrophils: 56 %
Platelets: 200 10*3/uL (ref 150–450)
RBC: 3.19 x10E6/uL — ABNORMAL LOW (ref 4.14–5.80)
RDW: 12.5 % (ref 11.6–15.4)
WBC: 4.3 10*3/uL (ref 3.4–10.8)

## 2022-08-14 LAB — VITAMIN B12: Vitamin B-12: 629 pg/mL (ref 232–1245)

## 2022-08-16 ENCOUNTER — Encounter (INDEPENDENT_AMBULATORY_CARE_PROVIDER_SITE_OTHER): Payer: Medicare PPO

## 2022-08-16 ENCOUNTER — Ambulatory Visit (INDEPENDENT_AMBULATORY_CARE_PROVIDER_SITE_OTHER): Payer: Medicare PPO | Admitting: Vascular Surgery

## 2022-08-16 ENCOUNTER — Other Ambulatory Visit: Payer: Self-pay | Admitting: Physician Assistant

## 2022-08-16 DIAGNOSIS — D649 Anemia, unspecified: Secondary | ICD-10-CM

## 2022-08-16 DIAGNOSIS — N184 Chronic kidney disease, stage 4 (severe): Secondary | ICD-10-CM

## 2022-08-16 DIAGNOSIS — E1122 Type 2 diabetes mellitus with diabetic chronic kidney disease: Secondary | ICD-10-CM

## 2022-08-18 ENCOUNTER — Other Ambulatory Visit: Payer: Self-pay

## 2022-08-18 LAB — HGB A1C W/O EAG: Hgb A1c MFr Bld: 5.4 % (ref 4.8–5.6)

## 2022-08-18 LAB — SPECIMEN STATUS REPORT

## 2022-08-18 MED ORDER — CARVEDILOL 6.25 MG PO TABS: 6.2500 mg | ORAL_TABLET | Freq: Two times a day (BID) | ORAL | 0 refills | Status: DC

## 2022-08-20 ENCOUNTER — Encounter: Payer: Self-pay | Admitting: Internal Medicine

## 2022-08-20 ENCOUNTER — Inpatient Hospital Stay: Payer: Medicare PPO | Attending: Internal Medicine | Admitting: Internal Medicine

## 2022-08-20 ENCOUNTER — Inpatient Hospital Stay: Payer: Medicare PPO

## 2022-08-20 DIAGNOSIS — Z8041 Family history of malignant neoplasm of ovary: Secondary | ICD-10-CM | POA: Insufficient documentation

## 2022-08-20 DIAGNOSIS — Z720 Tobacco use: Secondary | ICD-10-CM | POA: Diagnosis not present

## 2022-08-20 DIAGNOSIS — E119 Type 2 diabetes mellitus without complications: Secondary | ICD-10-CM | POA: Insufficient documentation

## 2022-08-20 DIAGNOSIS — Z808 Family history of malignant neoplasm of other organs or systems: Secondary | ICD-10-CM | POA: Diagnosis not present

## 2022-08-20 DIAGNOSIS — Z85118 Personal history of other malignant neoplasm of bronchus and lung: Secondary | ICD-10-CM | POA: Diagnosis not present

## 2022-08-20 DIAGNOSIS — N184 Chronic kidney disease, stage 4 (severe): Secondary | ICD-10-CM | POA: Diagnosis not present

## 2022-08-20 DIAGNOSIS — D649 Anemia, unspecified: Secondary | ICD-10-CM | POA: Diagnosis not present

## 2022-08-20 DIAGNOSIS — I509 Heart failure, unspecified: Secondary | ICD-10-CM | POA: Insufficient documentation

## 2022-08-20 NOTE — Progress Notes (Signed)
[place] NOTE  Patient Care Team: [name], PA-C as PCP - General (Physician Assistant) Kate Sable, MD as PCP - Cardiology (Cardiology) Anell Barr, OD (Optometry) Solum, Betsey Holiday, MD as Physician Assistant (Endocrinology) Abbie Sons, MD (Urology) Lyla Son, MD as Consulting Physician (Nephrology) Lin Landsman, MD as Consulting Physician (Gastroenterology) Vern Claude, LCSW as Social Worker  CHIEF COMPLAINTS/PURPOSE OF CONSULTATION: ANEMIA   HEMATOLOGY HISTORY  # Hx of right Upper lobe lung cancer [[facility]]- no chemo/RT.   # ANEMIA[Hb; MCV-platelets- WBC; Iron sat; ferritin;  GFR- IV CT/US; EGD/colonoscopy-[2022- [facility]]  # CKD- IV[Dr.Korrapati]   Latest Reference Range & Units 08/13/22 14:04  Iron 38 - 169 ug/dL 67  UIBC 111 - 343 ug/dL 230  TIBC 250 - 450 ug/dL 297  Ferritin 30 - 400 ng/mL 28 (L)  Iron Saturation 15 - 55 % 23  Vitamin B12 232 - 1,245 pg/mL 629  Globulin, Total 1.5 - 4.5 g/dL 2.2  WBC 3.4 - 10.8 x10E3/uL 4.3  RBC 4.14 - 5.80 x10E6/uL 3.19 (L)  Hemoglobin 13.0 - 17.7 g/dL 9.7 (L)  HCT 37.5 - 51.0 % 28.8 (L)  MCV 79 - 97 fL 90  MCH 26.6 - 33.0 pg 30.4  MCHC 31.5 - 35.7 g/dL 33.7  RDW 11.6 - 15.4 % 12.5  Platelets 150 - 450 x10E3/uL 200  (L): Data is abnormally low  HISTORY OF PRESENTING ILLNESS: Alone alone.  Ambulating independently. [name] 71 y.o.  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  Patient admits to chronic fatigue.  Also admits to restless legs especially at nighttime.  Blood in stools: none Blood in urine:none   Prior blood transfusion:none Prior history of blood loss: none Liver disease: none Alcohol: noen Bariatric surgery:none  Prior evaluation with  hematology:none Prior bone marrow biopsy: none  Oral iron: PO iron last 2 days Prior IV iron infusions: none.     Review of Systems  Constitutional:  Positive for malaise/fatigue. Negative for chills, diaphoresis, fever and weight loss.  HENT:  Negative for nosebleeds and sore throat.   Eyes:  Negative for double vision.  Respiratory:  Positive for shortness of breath. Negative for cough, hemoptysis, sputum production and wheezing.   Cardiovascular:  Negative for chest pain, palpitations, orthopnea and leg swelling.  Gastrointestinal:  Negative for abdominal pain, blood in stool, constipation, diarrhea, heartburn, melena, nausea and vomiting.  Genitourinary:  Negative for dysuria, frequency and urgency.  Musculoskeletal:  Negative for back pain and joint pain.  Skin: Negative.  Negative for itching and rash.  Neurological:  Positive for tingling. Negative for dizziness, focal weakness, weakness and headaches.  Endo/Heme/Allergies:  Does not bruise/bleed easily.  Psychiatric/Behavioral:  Negative for depression. The patient is not nervous/anxious and does not have insomnia.      MEDICAL HISTORY:  Past Medical History:  Diagnosis Date   Arthritis    hands   Back pain    leg pain   CAD (coronary artery disease)    a. 10/2019 Cor CTA: LM nl, LAD nl, LCX distal dzs w/ FFR 0.65-0.75. RCA nl-->Med Rx.   CKD (chronic kidney disease), stage III (HCC)    Claudication (HCC)    Diabetes mellitus    type 2   Diastolic dysfunction    a. 08/2019 Echo: EF 60-65%, mild LVH, impaired relaxation.  Nl RV size/fxn. Nl LA szie.   History of urinary retention    Hyperlipidemia    Hypertension    a. 05/2019 renal u/s concerning for R RAS; b. 06/2019 Renal artery angio: no more than15% bilat RAS.   Neuropathy    feet   Primary cancer of right upper lobe of lung ([place]) 2014   RUL Lobectomy   Wears dentures    full upper    SURGICAL HISTORY: Past Surgical History:  Procedure Laterality Date    BASAL CELL CARCINOMA EXCISION     COLONOSCOPY WITH PROPOFOL N/A 12/25/2019   Procedure: COLONOSCOPY WITH PROPOFOL;  Surgeon: Lin Landsman, MD;  Location: [place];  Service: Endoscopy;  Laterality: N/A;  Diabetic - injectable and oral meds   COLONOSCOPY WITH PROPOFOL N/A 10/08/2021   Procedure: COLONOSCOPY WITH PROPOFOL;  Surgeon: [name], MD;  Location: [facility] END[facility]COPY;  Service: Gastroenterology;  Laterality: N/A;   LUMBAR LAMINECTOMY/DECOMPRESSION MICRODISCECTOMY  01/10/2012   Procedure: LUMBAR LAMINECTOMY/DECOMPRESSION MICRODISCECTOMY 2 LEVELS;  Surgeon: Hosie Spangle, MD;  Location: [place] NEURO ORS;  Service: Neurosurgery;  Laterality: Bilateral;  Lumbar four-Sacral One laminectomies   LUNG SURGERY Right 11/22/12   right upper lobe   POLYPECTOMY N/A 12/25/2019   Procedure: POLYPECTOMY;  Surgeon: Lin Landsman, MD;  Location: [place];  Service: Endoscopy;  Laterality: N/A;   PROSTATE SURGERY     biopsy-due to elelvated PSA   RENAL ANGIOGRAPHY Right 07/02/2019   Procedure: RENAL ANGIOGRAPHY;  Surgeon: Algernon Huxley, MD;  Location: [place] [facility];  Service: Cardiovascular;  Laterality: Right;   SHOULDER SURGERY     VASECTOMY      SOCIAL HISTORY: Social History   Socioeconomic History   Marital status: Married    Spouse name: Not on file   Number of children: 4   Years of education: Not on file   Highest education level: Associate degree: occupational, Hotel manager, or vocational program  Occupational History   Occupation: retired  Tobacco Use   Smoking status: Former    Packs/day: 2.00    Years: 45.00    Total pack years: 90.00    Types: Cigarettes    Quit date: 11/28/2012    Years since quitting: 9.7   Smokeless tobacco: Current    Types: Snuff   Tobacco comments:    Quit in 2014  Vaping Use   Vaping Use: Never used  Substance and Sexual Activity   Alcohol use: No   Drug use: No   Sexual activity: Yes    Birth control/protection:  None  Other Topics Concern   Not on file  Social History Narrative   Lives in with wife; daughter; mother [alzheimer]; quit smoking- 2015 [lung cancer dx]; no alcohol; 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    Social Determinants of Health   Financial Resource Strain: Low Risk  (02/25/2022)   Overall Financial Resource Strain (CARDIA)    Difficulty of Paying Living Expenses: Not hard at all  Food Insecurity: No Food Insecurity (02/25/2022)   Hunger Vital Sign    Worried About Running Out of Food in the Last Year: Never true    Ran Out of Food in the Last Year: Never true  Transportation Needs: Unmet Transportation Needs (02/25/2022)   PRAPARE - Transportation    Lack of Transportation (Medical): Yes    Lack of Transportation (Non-Medical): Yes  Physical Activity: Inactive (12/31/2020)   Exercise Vital Sign    Days of Exercise per Week: 0 days  Minutes of Exercise per Session: 0 min  Stress: Stress Concern Present (02/25/2022)   [place]    Feeling of Stress : To some extent  Social Connections: Moderately Isolated (02/25/2022)   Social Connection and Isolation Panel [NHANES]    Frequency of Communication with Friends and Family: More than three times a week    Frequency of Social Gatherings with Friends and Family: More than three times a week    Attends Religious Services: Never    Marine scientist or Organizations: No    Attends Archivist Meetings: Never    Marital Status: Married  Human resources officer Violence: Not At Risk (12/31/2020)   Humiliation, Afraid, Rape, and Kick questionnaire    Fear of Current or Ex-Partner: No    Emotionally Abused: No    Physically Abused: No    Sexually Abused: No    FAMILY HISTORY: Family History  Problem Relation Age of Onset   Cancer Father        prostate   Heart disease Father        MI   Stroke Father    Cancer Brother        liver    ALLERGIES:  is  allergic to codeine, hydromorphone, and morphine and related.  MEDICATIONS:  Current Outpatient Medications  Medication Sig Dispense Refill   amLODipine (NORVASC) 10 MG tablet Take 1 tablet (10 mg total) by mouth daily. 90 tablet 3   aspirin EC 81 MG tablet Take 1 tablet (81 mg total) by mouth daily. Swallow whole. (Patient not taking: Reported on 08/13/2022) 90 tablet 3   carvedilol (COREG) 6.25 MG tablet Take 1 tablet (6.25 mg total) by mouth 2 (two) times daily. 180 tablet 0   cilostazol (PLETAL) 50 MG tablet Take 1 tablet (50 mg total) by mouth 2 (two) times daily. 180 tablet 3   Dulaglutide (TRULICITY) 1.5 GY/6.9SW SOPN Inject into the skin.     Dulaglutide 1.5 MG/0.5ML SOPN Inject 1.5 mg into the skin once a week.     FARXIGA 10 MG TABS tablet Take 10 mg by mouth every morning.     finasteride (PROSCAR) 5 MG tablet TAKE 1 TABLET (5 MG TOTAL) BY MOUTH DAILY. 90 tablet 3   glucose blood (ONETOUCH VERIO) test strip      sertraline (ZOLOFT) 50 MG tablet TAKE 1 TABLET BY MOUTH EVERY DAY 90 tablet 1   tamsulosin (FLOMAX) 0.4 MG CAPS capsule TAKE 1 CAPSULE BY MOUTH EVERY DAY AFTER SUPPER (Patient not taking: Reported on 08/13/2022) 90 capsule 1   No current facility-administered medications for this visit.     Marland Kitchen  PHYSICAL EXAMINATION:   Vitals:   08/20/22 1339  BP: (!) 188/70  Pulse: 65  Temp: 97.9 F (36.6 C)   Filed Weights   08/20/22 1339  Weight: 184 lb (83.5 kg)    Physical Exam Vitals and nursing note reviewed.  HENT:     Head: Normocephalic and atraumatic.     Mouth/Throat:     Pharynx: Oropharynx is clear.  Eyes:     Extraocular Movements: Extraocular movements intact.     Pupils: Pupils are equal, round, and reactive to light.  Cardiovascular:     Rate and Rhythm: Normal rate and regular rhythm.  Pulmonary:     Comments: Decreased breath sounds bilaterally.  Abdominal:     Palpations: Abdomen is soft.  Musculoskeletal:        General: Normal  range of motion.      Cervical back: Normal range of motion.  Skin:    General: Skin is warm.  Neurological:     General: No focal deficit present.     Mental Status: He is alert and oriented to person, place, and time.  Psychiatric:        Behavior: Behavior normal.        Judgment: Judgment normal.      LABORATORY DATA:  I have reviewed the data as listed Lab Results  Component Value Date   WBC 4.3 08/13/2022   HGB 9.7 (L) 08/13/2022   HCT 28.8 (L) 08/13/2022   MCV 90 08/13/2022   PLT 200 08/13/2022   Recent Labs    08/13/22 1404  NA 138  K 4.4  CL 104  CO2 20  GLUCOSE 243*  BUN 49*  CREATININE 2.84*  CALCIUM 8.3*  PROT 6.2  ALBUMIN 4.0  AST 9  ALT 8  ALKPHOS 85  BILITOT <0.2     No results found.  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  Patient is quite symptomatic [fatigue/restless leg] from his anemia .  Etiology likely CKD.  I had a long discussion with patient regarding multiple other etiologies of anemia-including nutritional; malabsorption;; primary bone marrow disorders etc. hold off any bone marrow biopsies at this time.  No labs today.  In 2 months/next visit-  cbc;bmp;LDH;  MM panel;K;/L ight chains; retic count; V03; folic acid-  # Discussed at length the pathophysiology of anemia from chronic kidney disease which includes decreased erythropoietin production; and decreased iron stores in the body.  I discussed that I would recommend using iron infusion/Venofer; along with Retacrit to maintain hemoglobin around 10.  I discussed the potential acute infusion reactions with IV iron; which are quite rare.  Patient understands the risk; will proceed with infusions.  Also discussed the role of Retacrit boosting the hemoglobin.  However the goal hemoglobin less than 10.  Hold off any Retacrit injections at this time.   # CKD stage IV- [DM/HTN]  # DM [Hb J0K-9.3]-  trulicity/ SGLT2 inhbitors  # CHF- chronic [Dr.Etang]- STABLE.   Thank you Ms. Drubel PAC for allowing me to participate in the care of your pleasant patient. Please do not hesitate to contact me with questions or concerns in the interim.  # DISPOSITION: # NO labs today- # venofer weekly x4- start ASAP Follow up in 8 weeks- MD: labs- cbc;bmp;LDH;  MM panel;K;/L ight chains; retic count; G18; folic acid- ; possible venofer-Dr.B    All questions were answered. The patient knows to call the clinic with any problems, questions or concerns.    Cammie Sickle, MD 08/20/2022 3:52 PM

## 2022-08-20 NOTE — Progress Notes (Signed)
Patient here for new patient appt for Anemia. Patient denies any concerns at this time.

## 2022-08-20 NOTE — Assessment & Plan Note (Addendum)
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  Patient is quite symptomatic [fatigue/restless leg] from his anemia .  Etiology likely CKD.  I had a long discussion with patient regarding multiple other etiologies of anemia-including nutritional; malabsorption;; primary bone marrow disorders etc. hold off any bone marrow biopsies at this time.  No labs today.  In 2 months/next visit-  cbc;bmp;LDH;  MM panel;K;/L ight chains; retic count; N98; folic acid-  # Discussed at length the pathophysiology of anemia from chronic kidney disease which includes decreased erythropoietin production; and decreased iron stores in the body.  I discussed that I would recommend using iron infusion/Venofer; along with Retacrit to maintain hemoglobin around 10.  I discussed the potential acute infusion reactions with IV iron; which are quite rare.  Patient understands the risk; will proceed with infusions.  Also discussed the role of Retacrit boosting the hemoglobin.  However the goal hemoglobin less than 10.  Hold off any Retacrit injections at this time.   # CKD stage IV- [DM/HTN]  # DM [Hb X2J-1.9]- trulicity/ SGLT2 inhbitors  # CHF- chronic [Dr.Etang]- STABLE.   Thank you Ms. Drubel PAC for allowing me to participate in the care of your pleasant patient. Please do not hesitate to contact me with questions or concerns in the interim.  # DISPOSITION: # NO labs today- # venofer weekly x4- start ASAP Follow up in 8 weeks- MD: labs- cbc;bmp;LDH;  MM panel;K;/L ight chains; retic count; E17; folic acid- ; possible venofer-Dr.B

## 2022-08-24 ENCOUNTER — Inpatient Hospital Stay: Payer: Medicare PPO

## 2022-08-24 VITALS — BP 146/67 | HR 66 | Temp 97.0°F | Resp 18

## 2022-08-24 DIAGNOSIS — D649 Anemia, unspecified: Secondary | ICD-10-CM | POA: Diagnosis not present

## 2022-08-24 DIAGNOSIS — Z8041 Family history of malignant neoplasm of ovary: Secondary | ICD-10-CM | POA: Diagnosis not present

## 2022-08-24 DIAGNOSIS — I509 Heart failure, unspecified: Secondary | ICD-10-CM | POA: Diagnosis not present

## 2022-08-24 DIAGNOSIS — Z808 Family history of malignant neoplasm of other organs or systems: Secondary | ICD-10-CM | POA: Diagnosis not present

## 2022-08-24 DIAGNOSIS — Z720 Tobacco use: Secondary | ICD-10-CM | POA: Diagnosis not present

## 2022-08-24 DIAGNOSIS — N184 Chronic kidney disease, stage 4 (severe): Secondary | ICD-10-CM | POA: Diagnosis not present

## 2022-08-24 DIAGNOSIS — E119 Type 2 diabetes mellitus without complications: Secondary | ICD-10-CM | POA: Diagnosis not present

## 2022-08-24 MED ORDER — SODIUM CHLORIDE 0.9 % IV SOLN
Freq: Once | INTRAVENOUS | Status: AC
  Filled 2022-08-24: qty 250

## 2022-08-24 MED ORDER — SODIUM CHLORIDE 0.9 % IV SOLN
200.0000 mg | Freq: Once | INTRAVENOUS | Status: AC
  Administered 2022-08-24: 200 mg via INTRAVENOUS
  Filled 2022-08-24: qty 200

## 2022-08-24 NOTE — Patient Instructions (Signed)

## 2022-08-26 ENCOUNTER — Encounter (INDEPENDENT_AMBULATORY_CARE_PROVIDER_SITE_OTHER): Payer: Medicare PPO

## 2022-08-26 ENCOUNTER — Ambulatory Visit (INDEPENDENT_AMBULATORY_CARE_PROVIDER_SITE_OTHER): Payer: Medicare PPO | Admitting: Vascular Surgery

## 2022-08-30 ENCOUNTER — Encounter (INDEPENDENT_AMBULATORY_CARE_PROVIDER_SITE_OTHER): Payer: Medicare PPO

## 2022-08-30 ENCOUNTER — Ambulatory Visit (INDEPENDENT_AMBULATORY_CARE_PROVIDER_SITE_OTHER): Payer: Medicare PPO | Admitting: Vascular Surgery

## 2022-08-30 ENCOUNTER — Inpatient Hospital Stay: Payer: Medicare PPO

## 2022-08-30 VITALS — BP 154/54 | HR 57 | Temp 97.0°F | Resp 18

## 2022-08-30 DIAGNOSIS — Z8041 Family history of malignant neoplasm of ovary: Secondary | ICD-10-CM | POA: Diagnosis not present

## 2022-08-30 DIAGNOSIS — I509 Heart failure, unspecified: Secondary | ICD-10-CM | POA: Diagnosis not present

## 2022-08-30 DIAGNOSIS — N184 Chronic kidney disease, stage 4 (severe): Secondary | ICD-10-CM | POA: Diagnosis not present

## 2022-08-30 DIAGNOSIS — Z720 Tobacco use: Secondary | ICD-10-CM | POA: Diagnosis not present

## 2022-08-30 DIAGNOSIS — D649 Anemia, unspecified: Secondary | ICD-10-CM

## 2022-08-30 DIAGNOSIS — Z808 Family history of malignant neoplasm of other organs or systems: Secondary | ICD-10-CM | POA: Diagnosis not present

## 2022-08-30 DIAGNOSIS — E119 Type 2 diabetes mellitus without complications: Secondary | ICD-10-CM | POA: Diagnosis not present

## 2022-08-30 MED ORDER — SODIUM CHLORIDE 0.9 % IV SOLN
Freq: Once | INTRAVENOUS | Status: AC
  Filled 2022-08-30: qty 250

## 2022-08-30 MED ORDER — SODIUM CHLORIDE 0.9 % IV SOLN
200.0000 mg | Freq: Once | INTRAVENOUS | Status: AC
  Administered 2022-08-30: 200 mg via INTRAVENOUS
  Filled 2022-08-30: qty 200

## 2022-08-30 NOTE — Patient Instructions (Signed)
[facility]  Discharge Instructions: Thank you for choosing [facility] to provide your oncology and hematology care.  If you have a lab appointment with the Cancer Center, please go directly to the Cancer Center and check in at the registration area.  Wear comfortable clothing and clothing appropriate for easy access to any Portacath or PICC line.   We strive to give you quality time with your provider. You may need to reschedule your appointment if you arrive late (15 or more minutes).  Arriving late affects you and other patients whose appointments are after yours.  Also, if you miss three or more appointments without notifying the office, you may be dismissed from the clinic at the provider's discretion.      For prescription refill requests, have your pharmacy contact our office and allow 72 hours for refills to be completed.    Today you received the following chemotherapy and/or immunotherapy agents VENOFER      To help prevent nausea and vomiting after your treatment, we encourage you to take your nausea medication as directed.  BELOW ARE SYMPTOMS THAT SHOULD BE REPORTED IMMEDIATELY: *FEVER GREATER THAN 100.4 F (38 C) OR HIGHER *CHILLS OR SWEATING *NAUSEA AND VOMITING THAT IS NOT CONTROLLED WITH YOUR NAUSEA MEDICATION *UNUSUAL SHORTNESS OF BREATH *UNUSUAL BRUISING OR BLEEDING *URINARY PROBLEMS (pain or burning when urinating, or frequent urination) *BOWEL PROBLEMS (unusual diarrhea, constipation, pain near the anus) TENDERNESS IN MOUTH AND THROAT WITH OR WITHOUT PRESENCE OF ULCERS (sore throat, sores in mouth, or a toothache) UNUSUAL RASH, SWELLING OR PAIN  UNUSUAL VAGINAL DISCHARGE OR ITCHING   Items with * indicate a potential emergency and should be followed up as soon as possible or go to the Emergency Department if any problems should occur.  Please show the CHEMOTHERAPY ALERT CARD or IMMUNOTHERAPY ALERT CARD at check-in to the  Emergency Department and triage nurse.  Should you have questions after your visit or need to cancel or reschedule your appointment, please contact [facility]  [phone] and follow the prompts.  Office hours are 8:00 a.m. to 4:30 p.m. Monday - Friday. Please note that voicemails left after 4:00 p.m. may not be returned until the following business day.  We are closed weekends and major holidays. You have access to a nurse at all times for urgent questions. Please call the main number to the clinic [phone] and follow the prompts.  For any non-urgent questions, you may also contact your provider using MyChart. We now offer e-Visits for anyone 18 and older to request care online for non-urgent symptoms. For details visit [email or web address].   Also download the MyChart app! Go to the app store, search "MyChart", open the app, select [facility], and log in with your MyChart username and password.  Masks are optional in the cancer centers. If you would like for your care team to wear a mask while they are taking care of you, please let them know. For doctor visits, patients may have with them one support person who is at least 71 years old. At this time, visitors are not allowed in the infusion area.  Iron Sucrose Injection What is this medication? IRON SUCROSE (EYE ern SOO krose) treats low levels of iron (iron deficiency anemia) in people with kidney disease. Iron is a mineral that plays an important role in making red blood cells, which carry oxygen from your lungs to the rest of your body. This medicine may be   used for other purposes; ask your health care provider or pharmacist if you have questions. COMMON BRAND NAME(S): Venofer What should I tell my care team before I take this medication? They need to know if you have any of these conditions: Anemia not caused by low iron levels Heart disease High levels of iron in the blood Kidney disease Liver  disease An unusual or allergic reaction to iron, other medications, foods, dyes, or preservatives Pregnant or trying to get pregnant Breast-feeding How should I use this medication? This medication is for infusion into a vein. It is given in a hospital or clinic setting. Talk to your care team about the use of this medication in children. While this medication may be prescribed for children as young as 2 years for selected conditions, precautions do apply. Overdosage: If you think you have taken too much of this medicine contact a poison control center or emergency room at once. NOTE: This medicine is only for you. Do not share this medicine with others. What if I miss a dose? It is important not to miss your dose. Call your care team if you are unable to keep an appointment. What may interact with this medication? Do not take this medication with any of the following: Deferoxamine Dimercaprol Other iron products This medication may also interact with the following: Chloramphenicol Deferasirox This list may not describe all possible interactions. Give your health care provider a list of all the medicines, herbs, non-prescription drugs, or dietary supplements you use. Also tell them if you smoke, drink alcohol, or use illegal drugs. Some items may interact with your medicine. What should I watch for while using this medication? Visit your care team regularly. Tell your care team if your symptoms do not start to get better or if they get worse. You may need blood work done while you are taking this medication. You may need to follow a special diet. Talk to your care team. Foods that contain iron include: whole grains/cereals, dried fruits, beans, or peas, leafy green vegetables, and organ meats (liver, kidney). What side effects may I notice from receiving this medication? Side effects that you should report to your care team as soon as possible: Allergic reactions--skin rash, itching, hives,  swelling of the face, lips, tongue, or throat Low blood pressure--dizziness, feeling faint or lightheaded, blurry vision Shortness of breath Side effects that usually do not require medical attention (report to your care team if they continue or are bothersome): Flushing Headache Joint pain Muscle pain Nausea Pain, redness, or irritation at injection site This list may not describe all possible side effects. Call your doctor for medical advice about side effects. You may report side effects to FDA at 1-800-FDA-1088. Where should I keep my medication? This medication is given in a hospital or clinic and will not be stored at home. NOTE: This sheet is a summary. It may not cover all possible information. If you have questions about this medicine, talk to your doctor, pharmacist, or health care provider.  2023 Elsevier/Gold Standard (2007-12-16 00:00:00)   

## 2022-09-06 ENCOUNTER — Encounter (INDEPENDENT_AMBULATORY_CARE_PROVIDER_SITE_OTHER): Payer: Self-pay

## 2022-09-06 ENCOUNTER — Inpatient Hospital Stay: Payer: Medicare PPO

## 2022-09-06 VITALS — BP 153/52 | HR 59 | Temp 97.9°F | Resp 18

## 2022-09-06 DIAGNOSIS — N184 Chronic kidney disease, stage 4 (severe): Secondary | ICD-10-CM | POA: Diagnosis not present

## 2022-09-06 DIAGNOSIS — D649 Anemia, unspecified: Secondary | ICD-10-CM | POA: Diagnosis not present

## 2022-09-06 DIAGNOSIS — Z8041 Family history of malignant neoplasm of ovary: Secondary | ICD-10-CM | POA: Diagnosis not present

## 2022-09-06 DIAGNOSIS — Z808 Family history of malignant neoplasm of other organs or systems: Secondary | ICD-10-CM | POA: Diagnosis not present

## 2022-09-06 DIAGNOSIS — E119 Type 2 diabetes mellitus without complications: Secondary | ICD-10-CM | POA: Diagnosis not present

## 2022-09-06 DIAGNOSIS — Z720 Tobacco use: Secondary | ICD-10-CM | POA: Diagnosis not present

## 2022-09-06 DIAGNOSIS — I509 Heart failure, unspecified: Secondary | ICD-10-CM | POA: Diagnosis not present

## 2022-09-06 MED ORDER — SODIUM CHLORIDE 0.9 % IV SOLN
Freq: Once | INTRAVENOUS | Status: AC
  Filled 2022-09-06: qty 250

## 2022-09-06 MED ORDER — SODIUM CHLORIDE 0.9 % IV SOLN
200.0000 mg | Freq: Once | INTRAVENOUS | Status: AC
  Administered 2022-09-06: 200 mg via INTRAVENOUS
  Filled 2022-09-06: qty 200

## 2022-09-06 NOTE — Patient Instructions (Signed)

## 2022-09-13 ENCOUNTER — Inpatient Hospital Stay: Payer: Medicare PPO | Attending: Internal Medicine

## 2022-09-13 VITALS — BP 157/48 | HR 68 | Temp 97.2°F | Resp 18

## 2022-09-13 DIAGNOSIS — N184 Chronic kidney disease, stage 4 (severe): Secondary | ICD-10-CM | POA: Insufficient documentation

## 2022-09-13 DIAGNOSIS — E1122 Type 2 diabetes mellitus with diabetic chronic kidney disease: Secondary | ICD-10-CM | POA: Diagnosis not present

## 2022-09-13 DIAGNOSIS — K529 Noninfective gastroenteritis and colitis, unspecified: Secondary | ICD-10-CM | POA: Diagnosis not present

## 2022-09-13 DIAGNOSIS — D649 Anemia, unspecified: Secondary | ICD-10-CM

## 2022-09-13 DIAGNOSIS — D509 Iron deficiency anemia, unspecified: Secondary | ICD-10-CM | POA: Diagnosis not present

## 2022-09-13 DIAGNOSIS — E119 Type 2 diabetes mellitus without complications: Secondary | ICD-10-CM | POA: Diagnosis not present

## 2022-09-13 DIAGNOSIS — N400 Enlarged prostate without lower urinary tract symptoms: Secondary | ICD-10-CM | POA: Diagnosis not present

## 2022-09-13 DIAGNOSIS — N2581 Secondary hyperparathyroidism of renal origin: Secondary | ICD-10-CM | POA: Diagnosis not present

## 2022-09-13 DIAGNOSIS — R809 Proteinuria, unspecified: Secondary | ICD-10-CM | POA: Diagnosis not present

## 2022-09-13 DIAGNOSIS — I10 Essential (primary) hypertension: Secondary | ICD-10-CM | POA: Diagnosis not present

## 2022-09-13 DIAGNOSIS — E875 Hyperkalemia: Secondary | ICD-10-CM | POA: Diagnosis not present

## 2022-09-13 LAB — PROTEIN / CREATININE RATIO, URINE
Albumin, U: 171
Creatinine, Urine: 50

## 2022-09-13 LAB — MICROALBUMIN / CREATININE URINE RATIO: Microalb Creat Ratio: 3420

## 2022-09-13 LAB — HEMOGLOBIN A1C: Hemoglobin A1C: 5.6

## 2022-09-13 MED ORDER — SODIUM CHLORIDE 0.9 % IV SOLN
Freq: Once | INTRAVENOUS | Status: AC
  Filled 2022-09-13: qty 250

## 2022-09-13 MED ORDER — SODIUM CHLORIDE 0.9 % IV SOLN
200.0000 mg | Freq: Once | INTRAVENOUS | Status: AC
  Administered 2022-09-13: 200 mg via INTRAVENOUS
  Filled 2022-09-13: qty 200

## 2022-09-13 NOTE — Patient Instructions (Signed)

## 2022-09-16 DIAGNOSIS — K529 Noninfective gastroenteritis and colitis, unspecified: Secondary | ICD-10-CM | POA: Diagnosis not present

## 2022-09-16 DIAGNOSIS — N184 Chronic kidney disease, stage 4 (severe): Secondary | ICD-10-CM | POA: Diagnosis not present

## 2022-09-16 DIAGNOSIS — N400 Enlarged prostate without lower urinary tract symptoms: Secondary | ICD-10-CM | POA: Diagnosis not present

## 2022-09-16 DIAGNOSIS — N2581 Secondary hyperparathyroidism of renal origin: Secondary | ICD-10-CM | POA: Diagnosis not present

## 2022-09-16 DIAGNOSIS — R809 Proteinuria, unspecified: Secondary | ICD-10-CM | POA: Diagnosis not present

## 2022-09-16 DIAGNOSIS — E1122 Type 2 diabetes mellitus with diabetic chronic kidney disease: Secondary | ICD-10-CM | POA: Diagnosis not present

## 2022-09-16 DIAGNOSIS — E875 Hyperkalemia: Secondary | ICD-10-CM | POA: Diagnosis not present

## 2022-09-16 DIAGNOSIS — D631 Anemia in chronic kidney disease: Secondary | ICD-10-CM | POA: Diagnosis not present

## 2022-09-16 DIAGNOSIS — I10 Essential (primary) hypertension: Secondary | ICD-10-CM | POA: Diagnosis not present

## 2022-10-18 ENCOUNTER — Inpatient Hospital Stay: Payer: Medicare PPO | Admitting: Internal Medicine

## 2022-10-18 ENCOUNTER — Inpatient Hospital Stay: Payer: Medicare PPO | Attending: Internal Medicine

## 2022-10-18 ENCOUNTER — Inpatient Hospital Stay: Payer: Medicare PPO

## 2022-10-18 ENCOUNTER — Encounter: Payer: Self-pay | Admitting: Internal Medicine

## 2022-10-18 VITALS — BP 216/78 | HR 63 | Temp 98.1°F | Wt 184.0 lb

## 2022-10-18 DIAGNOSIS — I130 Hypertensive heart and chronic kidney disease with heart failure and stage 1 through stage 4 chronic kidney disease, or unspecified chronic kidney disease: Secondary | ICD-10-CM | POA: Insufficient documentation

## 2022-10-18 DIAGNOSIS — D649 Anemia, unspecified: Secondary | ICD-10-CM | POA: Insufficient documentation

## 2022-10-18 DIAGNOSIS — N184 Chronic kidney disease, stage 4 (severe): Secondary | ICD-10-CM | POA: Diagnosis not present

## 2022-10-18 DIAGNOSIS — E1122 Type 2 diabetes mellitus with diabetic chronic kidney disease: Secondary | ICD-10-CM | POA: Insufficient documentation

## 2022-10-18 LAB — CBC WITH DIFFERENTIAL/PLATELET
Abs Immature Granulocytes: 0.01 10*3/uL (ref 0.00–0.07)
Basophils Absolute: 0.1 10*3/uL (ref 0.0–0.1)
Basophils Relative: 1 %
Eosinophils Absolute: 0.2 10*3/uL (ref 0.0–0.5)
Eosinophils Relative: 3 %
HCT: 34.3 % — ABNORMAL LOW (ref 39.0–52.0)
Hemoglobin: 11.2 g/dL — ABNORMAL LOW (ref 13.0–17.0)
Immature Granulocytes: 0 %
Lymphocytes Relative: 30 %
Lymphs Abs: 1.5 10*3/uL (ref 0.7–4.0)
MCH: 29.6 pg (ref 26.0–34.0)
MCHC: 32.7 g/dL (ref 30.0–36.0)
MCV: 90.7 fL (ref 80.0–100.0)
Monocytes Absolute: 0.3 10*3/uL (ref 0.1–1.0)
Monocytes Relative: 5 %
Neutro Abs: 3 10*3/uL (ref 1.7–7.7)
Neutrophils Relative %: 61 %
Platelets: 175 10*3/uL (ref 150–400)
RBC: 3.78 MIL/uL — ABNORMAL LOW (ref 4.22–5.81)
RDW: 12.5 % (ref 11.5–15.5)
WBC: 5 10*3/uL (ref 4.0–10.5)
nRBC: 0 % (ref 0.0–0.2)

## 2022-10-18 LAB — VITAMIN B12: Vitamin B-12: 301 pg/mL (ref 180–914)

## 2022-10-18 LAB — LACTATE DEHYDROGENASE: LDH: 148 U/L (ref 98–192)

## 2022-10-18 LAB — FOLATE: Folate: 13.2 ng/mL (ref >5.9)

## 2022-10-18 LAB — RETICULOCYTES
Immature Retic Fract: 6.9 % (ref 2.3–15.9)
RBC.: 3.77 MIL/uL — ABNORMAL LOW (ref 4.22–5.81)
Retic Count, Absolute: 35.4 10*3/uL (ref 19.0–186.0)
Retic Ct Pct: 0.9 % (ref 0.4–3.1)

## 2022-10-18 LAB — BASIC METABOLIC PANEL
Anion gap: 10 (ref 5–15)
BUN: 57 mg/dL — ABNORMAL HIGH (ref 8–23)
CO2: 21 mmol/L — ABNORMAL LOW (ref 22–32)
Calcium: 8.4 mg/dL — ABNORMAL LOW (ref 8.9–10.3)
Chloride: 109 mmol/L (ref 98–111)
Creatinine, Ser: 2.77 mg/dL — ABNORMAL HIGH (ref 0.61–1.24)
GFR, Estimated: 24 mL/min — ABNORMAL LOW (ref >60)
Glucose, Bld: 107 mg/dL — ABNORMAL HIGH (ref 70–99)
Potassium: 4.5 mmol/L (ref 3.5–5.1)
Sodium: 140 mmol/L (ref 135–145)

## 2022-10-18 NOTE — Progress Notes (Signed)
[place] NOTE  Patient Care Team: [name], PA-C as PCP - General (Physician Assistant) Kate Sable, MD as PCP - Cardiology (Cardiology) Anell Barr, OD (Optometry) Solum, Betsey Holiday, MD as Physician Assistant (Endocrinology) Abbie Sons, MD (Urology) Lyla Son, MD as Consulting Physician (Nephrology) Lin Landsman, MD as Consulting Physician (Gastroenterology) Vern Claude, LCSW as Social Worker  CHIEF COMPLAINTS/PURPOSE OF CONSULTATION: ANEMIA   HEMATOLOGY HISTORY  # Hx of right Upper lobe lung cancer [[facility]]- no chemo/RT.   # ANEMIA[Hb; MCV-platelets- WBC; Iron sat; ferritin;  GFR- IV CT/US; EGD/colonoscopy-[2022- [facility]]  # CKD- IV[Dr.Korrapati]   Latest Reference Range & Units 08/13/22 14:04  Iron 38 - 169 ug/dL 67  UIBC 111 - 343 ug/dL 230  TIBC 250 - 450 ug/dL 297  Ferritin 30 - 400 ng/mL 28 (L)  Iron Saturation 15 - 55 % 23  Vitamin B12 232 - 1,245 pg/mL 629  Globulin, Total 1.5 - 4.5 g/dL 2.2  WBC 3.4 - 10.8 x10E3/uL 4.3  RBC 4.14 - 5.80 x10E6/uL 3.19 (L)  Hemoglobin 13.0 - 17.7 g/dL 9.7 (L)  HCT 37.5 - 51.0 % 28.8 (L)  MCV 79 - 97 fL 90  MCH 26.6 - 33.0 pg 30.4  MCHC 31.5 - 35.7 g/dL 33.7  RDW 11.6 - 15.4 % 12.5  Platelets 150 - 450 x10E3/uL 200  (L): Data is abnormally low  HISTORY OF PRESENTING ILLNESS: Alone alone.  Ambulating independently.  Nicholas Nolan 71 y.o.  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   Patient is currently status post weekly IV iron infusions x 4.    Notes for improvement of energy levels.  Improvement of cold intolerance and also sleeping better at nighttime [restless legs].  He admits to not taking his blood pressure medication this morning.    Review of Systems  Constitutional:   Positive for malaise/fatigue. Negative for chills, diaphoresis, fever and weight loss.  HENT:  Negative for nosebleeds and sore throat.   Eyes:  Negative for double vision.  Respiratory:  Positive for shortness of breath. Negative for cough, hemoptysis, sputum production and wheezing.   Cardiovascular:  Negative for chest pain, palpitations, orthopnea and leg swelling.  Gastrointestinal:  Negative for abdominal pain, blood in stool, constipation, diarrhea, heartburn, melena, nausea and vomiting.  Genitourinary:  Negative for dysuria, frequency and urgency.  Musculoskeletal:  Negative for back pain and joint pain.  Skin: Negative.  Negative for itching and rash.  Neurological:  Positive for tingling. Negative for dizziness, focal weakness, weakness and headaches.  Endo/Heme/Allergies:  Does not bruise/bleed easily.  Psychiatric/Behavioral:  Negative for depression. The patient is not nervous/anxious and does not have insomnia.      MEDICAL HISTORY:  Past Medical History:  Diagnosis Date   Arthritis    hands   Back pain    leg pain   CAD (coronary artery disease)    a. 10/2019 Cor CTA: LM nl, LAD nl, LCX distal dzs w/ FFR 0.65-0.75. RCA nl-->Med Rx.   CKD (chronic kidney disease), stage III (HCC)    Claudication (HCC)    Diabetes mellitus    type 2   Diastolic dysfunction    a. 08/2019 Echo: EF 60-65%, mild LVH, impaired relaxation. Nl RV size/fxn. Nl LA szie.   History of urinary retention    Hyperlipidemia  Hypertension    a. 05/2019 renal u/s concerning for R RAS; b. 06/2019 Renal artery angio: no more than15% bilat RAS.   Neuropathy    feet   Primary cancer of right upper lobe of lung ([place]) 2014   RUL Lobectomy   Wears dentures    full upper    SURGICAL HISTORY: Past Surgical History:  Procedure Laterality Date   BASAL CELL CARCINOMA EXCISION     COLONOSCOPY WITH PROPOFOL N/A 12/25/2019   Procedure: COLONOSCOPY WITH PROPOFOL;  Surgeon: Lin Landsman, MD;   Location: [facility];  Service: Endoscopy;  Laterality: N/A;  Diabetic - injectable and oral meds   COLONOSCOPY WITH PROPOFOL N/A 10/08/2021   Procedure: COLONOSCOPY WITH PROPOFOL;  Surgeon: [name], MD;  Location: [facility] ENDOSCOPY;  Service: Gastroenterology;  Laterality: N/A;   LUMBAR LAMINECTOMY/DECOMPRESSION MICRODISCECTOMY  01/10/2012   Procedure: LUMBAR LAMINECTOMY/DECOMPRESSION MICRODISCECTOMY 2 LEVELS;  Surgeon: Hosie Spangle, MD;  Location: [facility] NEURO ORS;  Service: Neurosurgery;  Laterality: Bilateral;  Lumbar four-Sacral One laminectomies   LUNG SURGERY Right 11/22/12   right upper lobe   POLYPECTOMY N/A 12/25/2019   Procedure: POLYPECTOMY;  Surgeon: Lin Landsman, MD;  Location: [facility];  Service: Endoscopy;  Laterality: N/A;   PROSTATE SURGERY     biopsy-due to elelvated PSA   RENAL ANGIOGRAPHY Right 07/02/2019   Procedure: RENAL ANGIOGRAPHY;  Surgeon: Algernon Huxley, MD;  Location: [facility];  Service: Cardiovascular;  Laterality: Right;   SHOULDER SURGERY     VASECTOMY      SOCIAL HISTORY: Social History   Socioeconomic History   Marital status: Married    Spouse name: Not on file   Number of children: 4   Years of education: Not on file   Highest education level: Associate degree: occupational, Hotel manager, or vocational program  Occupational History   Occupation: retired  Tobacco Use   Smoking status: Former    Packs/day: 2.00    Years: 45.00    Total pack years: 90.00    Types: Cigarettes    Quit date: 11/28/2012    Years since quitting: 9.8   Smokeless tobacco: Current    Types: Snuff   Tobacco comments:    Quit in 2014  Vaping Use   Vaping Use: Never used  Substance and Sexual Activity   Alcohol use: No   Drug use: No   Sexual activity: Yes    Birth control/protection: None  Other Topics Concern   Not on file  Social History Narrative   Lives in with wife; daughter; mother [alzheimer]; quit smoking- 2015 [lung cancer  dx]; no alcohol; 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    Social Determinants of Health   Financial Resource Strain: Low Risk  (02/25/2022)   Overall Financial Resource Strain (CARDIA)    Difficulty of Paying Living Expenses: Not hard at all  Food Insecurity: No Food Insecurity (02/25/2022)   Hunger Vital Sign    Worried About Running Out of Food in the Last Year: Never true    Ran Out of Food in the Last Year: Never true  Transportation Needs: Unmet Transportation Needs (02/25/2022)   PRAPARE - Transportation    Lack of Transportation (Medical): Yes    Lack of Transportation (Non-Medical): Yes  Physical Activity: Inactive (12/31/2020)   Exercise Vital Sign    Days of Exercise per Week: 0 days    Minutes of Exercise per Session: 0 min  Stress: Stress Concern Present (02/25/2022)   [employer]  Occupational Health - Occupational Stress Questionnaire    Feeling of Stress : To some extent  Social Connections: Moderately Isolated (02/25/2022)   Social Connection and Isolation Panel [NHANES]    Frequency of Communication with Friends and Family: More than three times a week    Frequency of Social Gatherings with Friends and Family: More than three times a week    Attends Religious Services: Never    Marine scientist or Organizations: No    Attends Archivist Meetings: Never    Marital Status: Married  Human resources officer Violence: Not At Risk (12/31/2020)   Humiliation, Afraid, Rape, and Kick questionnaire    Fear of Current or Ex-Partner: No    Emotionally Abused: No    Physically Abused: No    Sexually Abused: No    FAMILY HISTORY: Family History  Problem Relation Age of Onset   Cancer Father        prostate   Heart disease Father        MI   Stroke Father    Cancer Brother        liver    ALLERGIES:  is allergic to codeine, hydromorphone, and morphine and related.  MEDICATIONS:  Current Outpatient Medications  Medication Sig Dispense Refill   amLODipine  (NORVASC) 10 MG tablet Take 1 tablet (10 mg total) by mouth daily. 90 tablet 3   carvedilol (COREG) 6.25 MG tablet Take 1 tablet (6.25 mg total) by mouth 2 (two) times daily. 180 tablet 0   cilostazol (PLETAL) 50 MG tablet Take 1 tablet (50 mg total) by mouth 2 (two) times daily. 180 tablet 3   Dulaglutide (TRULICITY) 1.5 VP/7.1GG SOPN Inject into the skin.     Dulaglutide 1.5 MG/0.5ML SOPN Inject 1.5 mg into the skin once a week.     FARXIGA 10 MG TABS tablet Take 10 mg by mouth every morning.     finasteride (PROSCAR) 5 MG tablet TAKE 1 TABLET (5 MG TOTAL) BY MOUTH DAILY. 90 tablet 3   glucose blood (ONETOUCH VERIO) test strip      sertraline (ZOLOFT) 50 MG tablet TAKE 1 TABLET BY MOUTH EVERY DAY 90 tablet 1   tamsulosin (FLOMAX) 0.4 MG CAPS capsule TAKE 1 CAPSULE BY MOUTH EVERY DAY AFTER SUPPER 90 capsule 1   aspirin EC 81 MG tablet Take 1 tablet (81 mg total) by mouth daily. Swallow whole. (Patient not taking: Reported on 08/13/2022) 90 tablet 3   No current facility-administered medications for this visit.     Marland Kitchen  PHYSICAL EXAMINATION:   Vitals:   10/18/22 1355  BP: (!) 216/78  Pulse: 63  Temp: 98.1 F (36.7 C)   Filed Weights   10/18/22 1355  Weight: 184 lb (83.5 kg)    Physical Exam Vitals and nursing note reviewed.  HENT:     Head: Normocephalic and atraumatic.     Mouth/Throat:     Pharynx: Oropharynx is clear.  Eyes:     Extraocular Movements: Extraocular movements intact.     Pupils: Pupils are equal, round, and reactive to light.  Cardiovascular:     Rate and Rhythm: Normal rate and regular rhythm.  Pulmonary:     Comments: Decreased breath sounds bilaterally.  Abdominal:     Palpations: Abdomen is soft.  Musculoskeletal:        General: Normal range of motion.     Cervical back: Normal range of motion.  Skin:    General: Skin is warm.  Neurological:  General: No focal deficit present.     Mental Status: He is alert and oriented to person, place, and  time.  Psychiatric:        Behavior: Behavior normal.        Judgment: Judgment normal.      LABORATORY DATA:  I have reviewed the data as listed Lab Results  Component Value Date   WBC 5.0 10/18/2022   HGB 11.2 (L) 10/18/2022   HCT 34.3 (L) 10/18/2022   MCV 90.7 10/18/2022   PLT 175 10/18/2022   Recent Labs    08/13/22 1404 10/18/22 1320  NA 138 140  K 4.4 4.5  CL 104 109  CO2 20 21*  GLUCOSE 243* 107*  BUN 49* 57*  CREATININE 2.84* 2.77*  CALCIUM 8.3* 8.4*  GFRNONAA  --  24*  PROT 6.2  --   ALBUMIN 4.0  --   AST 9  --   ALT 8  --   ALKPHOS 85  --   BILITOT <0.2  --      No results found.  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 HOLD Iv venofer today. Recommend re-starting PO iron pills [no GI issues].   Today Pending today MM panel;K;/L ight chains; retic count; U23; folic acid. Also discussed the role of Retacrit boosting the hemoglobin.  However the goal hemoglobin less than 10.  Hold off any Retacrit injections at this time.   #Hypertension-poorly [id number] systolic. Discussed that poorly controlled blood pressures can cause a stroke/heart attacks and other cardiovascular problems. recommend compliance with medication/also checking blood pressures at home frequently. keep a log of blood pressures/and bring it to PCPs attention. Recommend taking BP medications regularly.   # CKD stage IV- [DM/HTN]-follow-up with PCP.  # DM [Hb P6P-9.5]- trulicity/ SGLT2 inhbitors  # CHF- chronic [Dr.Etang]- STABLE.   # DISPOSITION:  # HOLD venofer. # Follow up in 3 months - MD: labs- cbc;bmp;LDH; iron studies;ferritin;; possible venofer-Dr.B     All questions were answered. The patient knows to call the clinic with any problems, questions or concerns.    Cammie Sickle, MD 10/18/2022 2:21 PM

## 2022-10-18 NOTE — Progress Notes (Signed)
Patient here for follow up. Patient denies any concerns. Patient bp is 214/79 P:63

## 2022-10-18 NOTE — Assessment & Plan Note (Addendum)
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 HOLD Iv venofer today. Recommend re-starting PO iron pills [no GI issues].   Today Pending today MM panel;K;/L ight chains; retic count; B61; folic acid. Also discussed the role of Retacrit boosting the hemoglobin.  However the goal hemoglobin less than 10.  Hold off any Retacrit injections at this time.   #Hypertension-poorly [id number] systolic. Discussed that poorly controlled blood pressures can cause a stroke/heart attacks and other cardiovascular problems. recommend compliance with medication/also checking blood pressures at home frequently. keep a log of blood pressures/and bring it to PCPs attention. Recommend taking BP medications regularly.   # CKD stage IV- [DM/HTN]-follow-up with PCP.  # DM [Hb Q3L-9.4]- trulicity/ SGLT2 inhbitors  # CHF- chronic [Dr.Etang]- STABLE.   # DISPOSITION:  # HOLD venofer. # Follow up in 3 months - MD: labs- cbc;bmp;LDH; iron studies;ferritin;; possible venofer-Dr.B

## 2022-10-19 LAB — KAPPA/LAMBDA LIGHT CHAINS
Kappa free light chain: 103.8 mg/L — ABNORMAL HIGH (ref 3.3–19.4)
Kappa, lambda light chain ratio: 1.85 — ABNORMAL HIGH (ref 0.26–1.65)
Lambda free light chains: 56.2 mg/L — ABNORMAL HIGH (ref 5.7–26.3)

## 2022-10-25 LAB — MULTIPLE MYELOMA PANEL, SERUM
Albumin SerPl Elph-Mcnc: 3.5 g/dL (ref 2.9–4.4)
Albumin/Glob SerPl: 1.4 (ref 0.7–1.7)
Alpha 1: 0.2 g/dL (ref 0.0–0.4)
Alpha2 Glob SerPl Elph-Mcnc: 0.7 g/dL (ref 0.4–1.0)
B-Globulin SerPl Elph-Mcnc: 0.8 g/dL (ref 0.7–1.3)
Gamma Glob SerPl Elph-Mcnc: 0.8 g/dL (ref 0.4–1.8)
Globulin, Total: 2.6 g/dL (ref 2.2–3.9)
IgA: 145 mg/dL (ref 61–437)
IgG (Immunoglobin G), Serum: 708 mg/dL (ref 603–1613)
IgM (Immunoglobulin M), Srm: 175 mg/dL — ABNORMAL HIGH (ref 15–143)
Total Protein ELP: 6.1 g/dL (ref 6.0–8.5)

## 2022-11-02 ENCOUNTER — Other Ambulatory Visit: Payer: Self-pay

## 2022-11-02 MED ORDER — CILOSTAZOL 50 MG PO TABS: 50.0000 mg | ORAL_TABLET | Freq: Two times a day (BID) | ORAL | 0 refills | Status: DC

## 2022-11-04 ENCOUNTER — Other Ambulatory Visit: Payer: Self-pay | Admitting: *Deleted

## 2022-11-04 NOTE — Telephone Encounter (Signed)
X2  Unable to LVM to verify if pt is taking hydralazine 50 mg tablet. Refill request received from [facility].

## 2022-11-09 ENCOUNTER — Telehealth: Payer: Self-pay | Admitting: Cardiology

## 2022-11-09 NOTE — Telephone Encounter (Signed)
Unable to leave VM to verify medication. Pt's VM box is full/no answer.

## 2022-11-09 NOTE — Telephone Encounter (Signed)
Left a message with the patient that there was no record of someone calling but to call back if anything was needed.

## 2022-11-09 NOTE — Telephone Encounter (Signed)
Patient states he is returning a call, but he does not know who called or what it may have been regarding.

## 2022-11-29 ENCOUNTER — Other Ambulatory Visit: Payer: Medicare PPO

## 2022-12-06 ENCOUNTER — Other Ambulatory Visit: Payer: Medicare PPO

## 2022-12-06 DIAGNOSIS — N401 Enlarged prostate with lower urinary tract symptoms: Secondary | ICD-10-CM

## 2022-12-07 ENCOUNTER — Other Ambulatory Visit: Payer: Self-pay | Admitting: Physician Assistant

## 2022-12-07 ENCOUNTER — Encounter: Payer: Self-pay | Admitting: Family Medicine

## 2022-12-07 DIAGNOSIS — I10 Essential (primary) hypertension: Secondary | ICD-10-CM | POA: Diagnosis not present

## 2022-12-07 DIAGNOSIS — N184 Chronic kidney disease, stage 4 (severe): Secondary | ICD-10-CM | POA: Diagnosis not present

## 2022-12-07 DIAGNOSIS — Z794 Long term (current) use of insulin: Secondary | ICD-10-CM | POA: Diagnosis not present

## 2022-12-07 DIAGNOSIS — F322 Major depressive disorder, single episode, severe without psychotic features: Secondary | ICD-10-CM

## 2022-12-07 DIAGNOSIS — E1122 Type 2 diabetes mellitus with diabetic chronic kidney disease: Secondary | ICD-10-CM | POA: Diagnosis not present

## 2022-12-07 DIAGNOSIS — E782 Mixed hyperlipidemia: Secondary | ICD-10-CM | POA: Diagnosis not present

## 2022-12-07 DIAGNOSIS — E1159 Type 2 diabetes mellitus with other circulatory complications: Secondary | ICD-10-CM | POA: Diagnosis not present

## 2022-12-07 LAB — PSA: Prostate Specific Ag, Serum: 11.5 ng/mL — ABNORMAL HIGH (ref 0.0–4.0)

## 2022-12-17 ENCOUNTER — Telehealth: Payer: Self-pay | Admitting: *Deleted

## 2022-12-17 DIAGNOSIS — R972 Elevated prostate specific antigen [PSA]: Secondary | ICD-10-CM

## 2022-12-17 NOTE — Telephone Encounter (Signed)
-----   Message from Abbie Sons, MD sent at 12/17/2022  7:32 AM EST ----- Has not read his MyChart message.  He has had a previous biopsy for a PSA of 16.3 so can continue to monitor.  Recommend follow-up visit with PSA July 2024

## 2022-12-17 NOTE — Telephone Encounter (Signed)
Pt called back, he has not been taking the finasteride. He does have follow-up in July for labs and [place] for PSA entered.

## 2022-12-20 ENCOUNTER — Encounter: Payer: Self-pay | Admitting: Cardiology

## 2022-12-20 ENCOUNTER — Ambulatory Visit: Payer: Medicare PPO | Attending: Cardiology | Admitting: Cardiology

## 2022-12-20 VITALS — BP 192/68 | HR 59 | Ht 70.0 in | Wt 184.6 lb

## 2022-12-20 DIAGNOSIS — I2510 Atherosclerotic heart disease of native coronary artery without angina pectoris: Secondary | ICD-10-CM | POA: Diagnosis not present

## 2022-12-20 DIAGNOSIS — I10 Essential (primary) hypertension: Secondary | ICD-10-CM | POA: Diagnosis not present

## 2022-12-20 MED ORDER — HYDRALAZINE HCL 100 MG PO TABS: 100.0000 mg | ORAL_TABLET | Freq: Three times a day (TID) | ORAL | 3 refills | Status: DC

## 2022-12-20 NOTE — Patient Instructions (Signed)
Medication Instructions:   INCREASE Hydralazine - take one tablet (100 mg) by mouth Three times a day.   *If you need a refill on your cardiac medications before your next appointment, please call your pharmacy*   Lab Work:  None ordered  If you have labs (blood work) drawn today and your tests are completely normal, you will receive your results only by: [facility] [place] (if you have MyChart) OR A paper copy in the mail If you have any lab test that is abnormal or we need to change your treatment, we will call you to review the results.   Testing/Procedures:  None Ordered   Follow-Up: At [facility], you and your health needs are our priority.  As part of our continuing mission to provide you with exceptional heart care, we have created designated Provider Care Teams.  These Care Teams include your primary Cardiologist (physician) and Advanced Practice Providers (APPs -  Physician Assistants and Nurse Practitioners) who all work together to provide you with the care you need, when you need it.  We recommend signing up for the patient portal called "MyChart".  Sign up information is provided on this After Visit Summary.  MyChart is used to connect with patients for Virtual Visits (Telemedicine).  Patients are able to view lab/test results, encounter notes, upcoming appointments, etc.  Non-urgent messages can be sent to your provider as well.   To learn more about what you can do with MyChart, go to [email or web address].    Your next appointment:   6 week(s)  Provider:   Kate Sable, MD ONLY

## 2022-12-20 NOTE — Progress Notes (Signed)
Cardiology Office Note:    Date:  12/20/2022   [name], DOB 1951/04/14, MRN [id number]  PCP:  [name], PA-C  Cardiologist:  Kate Sable, MD  Electrophysiologist:  None   Referring MD: [name], PA-C   Chief Complaint  Patient presents with   Follow-up    6 month f/u, Blood pressure still running high     History of Present Illness:    Nicholas Nolan is a 72 y.o. male with a hx of CAD (CCTA 10/2019-Ca score 1689, mild LAD, LCx disease, FFRct with no sig obstruction), PAD, CKD, hypertension, diabetes, hyperlipidemia, former smoker x40+ years who presents for follow-up.  Being seen for nonobstructive CAD, hypertension.  BP has been elevated of late.  Denies any chest pain or shortness of breath.  Compliant with medications as prescribed.   Prior notes  Renal ultrasound and renal angiogram were performed 06/2019 to evaluate for possible secondary causes but that was normal  Echo 08/2019 showed normal systolic function, EF 60 to 65%, impaired relaxation, mild LVH. PAD, evaluated by interventional cardiology, angio being deferred for now due to risk of CIN   Past Medical History:  Diagnosis Date   Arthritis    hands   Back pain    leg pain   CAD (coronary artery disease)    a. 10/2019 Cor CTA: LM nl, LAD nl, LCX distal dzs w/ FFR 0.65-0.75. RCA nl-->Med Rx.   CKD (chronic kidney disease), stage III (HCC)    Claudication (HCC)    Diabetes mellitus    type 2   Diastolic dysfunction    a. 08/2019 Echo: EF 60-65%, mild LVH, impaired relaxation. Nl RV size/fxn. Nl LA szie.   History of urinary retention    Hyperlipidemia    Hypertension    a. 05/2019 renal u/s concerning for R RAS; b. 06/2019 Renal artery angio: no more than15% bilat RAS.   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   RUL Lobectomy   Wears dentures    full upper    Past Surgical History:  Procedure Laterality Date   BASAL CELL CARCINOMA  EXCISION     COLONOSCOPY WITH PROPOFOL N/A 12/25/2019   Procedure: COLONOSCOPY WITH PROPOFOL;  Surgeon: Lin Landsman, MD;  Location: [facility];  Service: Endoscopy;  Laterality: N/A;  Diabetic - injectable and oral meds   COLONOSCOPY WITH PROPOFOL N/A 10/08/2021   Procedure: COLONOSCOPY WITH PROPOFOL;  Surgeon: [name], MD;  Location: [facility] ENDOSCOPY;  Service: Gastroenterology;  Laterality: N/A;   LUMBAR LAMINECTOMY/DECOMPRESSION MICRODISCECTOMY  01/10/2012   Procedure: LUMBAR LAMINECTOMY/DECOMPRESSION MICRODISCECTOMY 2 LEVELS;  Surgeon: Hosie Spangle, MD;  Location: [facility]URO ORS;  Service: Neurosurgery;  Laterality: Bilateral;  Lumbar four-Sacral One laminectomies   LUNG SURGERY Right 11/22/12   right upper lobe   POLYPECTOMY N/A 12/25/2019   Procedure: POLYPECTOMY;  Surgeon: Lin Landsman, MD;  Location: [facility];  Service: Endoscopy;  Laterality: N/A;   PROSTATE SURGERY     biopsy-due to elelvated PSA   RENAL ANGIOGRAPHY Right 07/02/2019   Procedure: RENAL ANGIOGRAPHY;  Surgeon: Algernon Huxley, MD;  Location: [facility];  Service: Cardiovascular;  Laterality: Right;   SHOULDER SURGERY     VASECTOMY      Current Medications: Current Meds  Medication Sig   amLODipine (NORVASC) 10 MG tablet Take 1 tablet (10 mg total) by mouth daily.   carvedilol (COREG)  6.25 MG tablet Take 1 tablet (6.25 mg total) by mouth 2 (two) times daily.   cilostazol (PLETAL) 50 MG tablet Take 1 tablet (50 mg total) by mouth 2 (two) times daily.   Dulaglutide 1.5 MG/0.5ML SOPN Inject 1.5 mg into the skin once a week.   FARXIGA 10 MG TABS tablet Take 10 mg by mouth every morning.   finasteride (PROSCAR) 5 MG tablet TAKE 1 TABLET (5 MG TOTAL) BY MOUTH DAILY.   glucose blood (ONETOUCH VERIO) test strip    hydrALAZINE (APRESOLINE) 100 MG tablet Take 1 tablet (100 mg total) by mouth 3 (three) times daily.   olmesartan (BENICAR) 5 MG tablet Take 5 mg by mouth daily.    sertraline (ZOLOFT) 50 MG tablet TAKE 1 TABLET BY MOUTH EVERY DAY   tamsulosin (FLOMAX) 0.4 MG CAPS capsule TAKE 1 CAPSULE BY MOUTH EVERY DAY AFTER SUPPER   [DISCONTINUED] hydrALAZINE (APRESOLINE) 50 MG tablet Take 50 mg by mouth 3 (three) times daily.     Allergies:   Codeine, Hydromorphone, and Morphine and related   Social History   Socioeconomic History   Marital status: Married    Spouse name: Not on file   Number of children: 4   Years of education: Not on file   Highest education level: Associate degree: occupational, Hotel manager, or vocational program  Occupational History   Occupation: retired  Tobacco Use   Smoking status: Former    Packs/day: 2.00    Years: 45.00    Total pack years: 90.00    Types: Cigarettes    Quit date: 11/28/2012    Years since quitting: 10.0   Smokeless tobacco: Current    Types: Snuff  Vaping Use   Vaping Use: Never used  Substance and Sexual Activity   Alcohol use: No   Drug use: No   Sexual activity: Yes    Birth control/protection: None  Other Topics Concern   Not on file  Social History Narrative   Lives in with wife; daughter; mother [alzheimer]; quit smoking- 2015 [lung cancer dx]; no alcohol; 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    Social Determinants of Health   Financial Resource Strain: Low Risk  (02/25/2022)   Overall Financial Resource Strain (CARDIA)    Difficulty of Paying Living Expenses: Not hard at all  Food Insecurity: No Food Insecurity (02/25/2022)   Hunger Vital Sign    Worried About Running Out of Food in the Last Year: Never true    Ran Out of Food in the Last Year: Never true  Transportation Needs: Unmet Transportation Needs (02/25/2022)   PRAPARE - Transportation    Lack of Transportation (Medical): Yes    Lack of Transportation (Non-Medical): Yes  Physical Activity: Inactive (12/31/2020)   Exercise Vital Sign    Days of Exercise per Week: 0 days    Minutes of Exercise per Session: 0 min  Stress: Stress Concern  Present (02/25/2022)   [place]    Feeling of Stress : To some extent  Social Connections: Moderately Isolated (02/25/2022)   Social Connection and Isolation Panel [NHANES]    Frequency of Communication with Friends and Family: More than three times a week    Frequency of Social Gatherings with Friends and Family: More than three times a week    Attends Religious Services: Never    Marine scientist or Organizations: No    Attends Archivist Meetings: Never    Marital Status: Married  Family History: The patient's family history includes Cancer in his brother and father; Heart disease in his father; Stroke in his father.  ROS:   Please see the history of present illness.     All other systems reviewed and are negative.  EKGs/Labs/Other Studies Reviewed:    The following studies were reviewed today:   EKG:  EKG is ordered today.  EKG shows sinus bradycardia, heart rate 59  Recent Labs: 08/13/2022: ALT 8 10/18/2022: BUN 57; Creatinine, Ser 2.77; Hemoglobin 11.2; Platelets 175; Potassium 4.5; Sodium 140  Recent Lipid Panel    Component Value Date/Time   CHOL 126 08/21/2020 1246   CHOL 221 (H) 10/19/2019 1046   TRIG 89 08/21/2020 1246   HDL 48 08/21/2020 1246   HDL 43 10/19/2019 1046   CHOLHDL 2.6 08/21/2020 1246   VLDL 18 08/21/2020 1246   LDLCALC 60 08/21/2020 1246   LDLCALC 150 (H) 10/19/2019 1046    Physical Exam:    VS:  BP (!) 192/68 (BP Location: Left Arm, Patient Position: Sitting, Cuff Size: Normal)   Pulse (!) 59   Ht 5\' 10"  (1.778 m)   Wt 184 lb 9.6 oz (83.7 kg)   SpO2 98%   BMI 26.49 kg/m     Wt Readings from Last 3 Encounters:  12/20/22 184 lb 9.6 oz (83.7 kg)  10/18/22 184 lb (83.5 kg)  08/20/22 184 lb (83.5 kg)     GEN:  Well nourished, well developed in no acute distress HEENT: Normal NECK: No JVD; bilateral carotid bruit noted worse on the right. CARDIAC: RRR,  no murmurs, rubs, gallops RESPIRATORY:  Clear to auscultation without rales, wheezing or rhonchi  ABDOMEN: Soft, non-tender, non-distended MUSCULOSKELETAL:  No edema; No deformity, extremities appear warm, DP difficult to palpate SKIN: Warm and dry NEUROLOGIC:  Alert and oriented x 3 PSYCHIATRIC:  Normal affect  ASSESSMENT:    1. Primary hypertension   2. Coronary artery disease involving native coronary artery of native heart without angina pectoris     PLAN:    In order of problems listed above:  Hypertension, BP systolic elevated,.  Increase hydralazine to 100 mg 3 times daily.  Continue Norvasc, Coreg.  Plan to titrate Benicar at follow-up visit if BP still elevated.  Heart rate 59 preventing up titration of Coreg.   nonobstructive CAD in LAD and RCA(Ca2+ 1698, FFR CT no stenosis).  Denies chest pain.  LDL at goal.  Continue aspirin, Lipitor, carvedilol.  Follow-up in 6 weeks  Medication Adjustments/Labs and Tests Ordered: Current medicines are reviewed at length with the patient today.  Concerns regarding medicines are outlined above.  Orders Placed This Encounter  Procedures   EKG 12-Lead   Meds ordered this encounter  Medications   hydrALAZINE (APRESOLINE) 100 MG tablet    Sig: Take 1 tablet (100 mg total) by mouth 3 (three) times daily.    Dispense:  270 tablet    Refill:  3    Patient Instructions  Medication Instructions:   INCREASE Hydralazine - take one tablet (100 mg) by mouth Three times a day.   *If you need a refill on your cardiac medications before your next appointment, please call your pharmacy*   Lab Work:  None ordered  If you have labs (blood work) drawn today and your tests are completely normal, you will receive your results only by: [place] (if you have MyChart) OR A paper copy in the mail If you have any lab test that is abnormal or  we need to change your treatment, we will call you to review the  results.   Testing/Procedures:  None Ordered   Follow-Up: At [facility], you and your health needs are our priority.  As part of our continuing mission to provide you with exceptional heart care, we have created designated Provider Care Teams.  These Care Teams include your primary Cardiologist (physician) and Advanced Practice Providers (APPs -  Physician Assistants and Nurse Practitioners) who all work together to provide you with the care you need, when you need it.  We recommend signing up for the patient portal called "MyChart".  Sign up information is provided on this After Visit Summary.  MyChart is used to connect with patients for Virtual Visits (Telemedicine).  Patients are able to view lab/test results, encounter notes, upcoming appointments, etc.  Non-urgent messages can be sent to your provider as well.   To learn more about what you can do with MyChart, go to [email or web address].    Your next appointment:   6 week(s)  Provider:   Kate Sable, MD ONLY       Signed, Kate Sable, MD  12/20/2022 2:47 PM    [place]

## 2022-12-28 ENCOUNTER — Ambulatory Visit: Payer: Self-pay | Admitting: *Deleted

## 2022-12-28 NOTE — Telephone Encounter (Signed)
Third attempt to reach pt, routing to practice for PCP's resolution per protocol.

## 2022-12-28 NOTE — Telephone Encounter (Signed)
Second attempt to contact patient- left message to call office

## 2022-12-28 NOTE — Telephone Encounter (Signed)
possible nausea   Pt called to reschedule his CPE that was tomorrow due to feeling sick on his stomach / please advise     Left VM to call back to assess symptoms.

## 2022-12-29 ENCOUNTER — Encounter: Payer: Medicare PPO | Admitting: Physician Assistant

## 2023-01-05 ENCOUNTER — Encounter: Payer: Self-pay | Admitting: Physician Assistant

## 2023-01-05 ENCOUNTER — Ambulatory Visit (INDEPENDENT_AMBULATORY_CARE_PROVIDER_SITE_OTHER): Payer: Medicare PPO | Admitting: Physician Assistant

## 2023-01-05 VITALS — BP 171/67 | HR 67 | Temp 97.7°F | Ht 70.0 in | Wt 181.8 lb

## 2023-01-05 DIAGNOSIS — N184 Chronic kidney disease, stage 4 (severe): Secondary | ICD-10-CM | POA: Diagnosis not present

## 2023-01-05 DIAGNOSIS — E1159 Type 2 diabetes mellitus with other circulatory complications: Secondary | ICD-10-CM | POA: Diagnosis not present

## 2023-01-05 DIAGNOSIS — E1122 Type 2 diabetes mellitus with diabetic chronic kidney disease: Secondary | ICD-10-CM

## 2023-01-05 DIAGNOSIS — Z5320 Procedure and treatment not carried out because of patient's decision for unspecified reasons: Secondary | ICD-10-CM

## 2023-01-05 DIAGNOSIS — H9193 Unspecified hearing loss, bilateral: Secondary | ICD-10-CM | POA: Diagnosis not present

## 2023-01-05 DIAGNOSIS — G4700 Insomnia, unspecified: Secondary | ICD-10-CM

## 2023-01-05 DIAGNOSIS — E785 Hyperlipidemia, unspecified: Secondary | ICD-10-CM

## 2023-01-05 DIAGNOSIS — I152 Hypertension secondary to endocrine disorders: Secondary | ICD-10-CM | POA: Diagnosis not present

## 2023-01-05 DIAGNOSIS — H9313 Tinnitus, bilateral: Secondary | ICD-10-CM | POA: Diagnosis not present

## 2023-01-05 DIAGNOSIS — Z0000 Encounter for general adult medical examination without abnormal findings: Secondary | ICD-10-CM | POA: Diagnosis not present

## 2023-01-05 DIAGNOSIS — Z7985 Long-term (current) use of injectable non-insulin antidiabetic drugs: Secondary | ICD-10-CM

## 2023-01-05 DIAGNOSIS — E1169 Type 2 diabetes mellitus with other specified complication: Secondary | ICD-10-CM | POA: Diagnosis not present

## 2023-01-05 MED ORDER — TRAZODONE HCL 50 MG PO TABS: 25.0000 mg | ORAL_TABLET | Freq: Every evening | ORAL | 1 refills | Status: DC | PRN

## 2023-01-05 NOTE — Progress Notes (Signed)
I,Connie R Striblin,acting as a Education administrator for [employer], PA-C.,have documented all relevant documentation on the behalf of [name], PA-C,as directed by  [name], PA-C while in the presence of [name], PA-C.   Annual Wellness Visit     Patient: Nicholas Nolan, Male    DOB: 12-29-1950, 72 y.o.   MRN: [id number] Visit Date: 01/05/2023  Today's Provider: [name], PA-C   Cc. awv  Subjective    Nicholas Nolan is a 72 y.o. male who presents today for his Annual Wellness Visit. He reports consuming a general diet. The patient has a physically strenuous job, but has no regular exercise apart from work.  He generally feels fairly well. He reports sleeping poorly. He does not have additional problems to discuss today.   HPI Tinnitus: Patient presents with tinnitus. Onset of symptoms started  3 years ago ago with gradually worsening course since that time. Patient describes the tinnitus as constant located in the bilateral ear. The quality is described as medium range pitch .Reports hearing loss on both sides.   Medications: Outpatient Medications Prior to Visit  Medication Sig   amLODipine (NORVASC) 10 MG tablet Take 1 tablet (10 mg total) by mouth daily.   carvedilol (COREG) 6.25 MG tablet Take 1 tablet (6.25 mg total) by mouth 2 (two) times daily.   cilostazol (PLETAL) 50 MG tablet Take 1 tablet (50 mg total) by mouth 2 (two) times daily.   FARXIGA 10 MG TABS tablet Take 10 mg by mouth every morning.   finasteride (PROSCAR) 5 MG tablet TAKE 1 TABLET (5 MG TOTAL) BY MOUTH DAILY.   glucose blood (ONETOUCH VERIO) test strip    hydrALAZINE (APRESOLINE) 100 MG tablet Take 1 tablet (100 mg total) by mouth 3 (three) times daily.   olmesartan (BENICAR) 5 MG tablet Take 5 mg by mouth daily.   sertraline (ZOLOFT) 50 MG tablet TAKE 1 TABLET BY MOUTH EVERY DAY   tamsulosin (FLOMAX) 0.4 MG CAPS capsule TAKE 1 CAPSULE BY MOUTH EVERY DAY AFTER SUPPER   aspirin EC 81  MG tablet Take 1 tablet (81 mg total) by mouth daily. Swallow whole. (Patient not taking: Reported on 12/20/2022)   Dulaglutide (TRULICITY) 1.5 0000000 SOPN Inject into the skin. (Patient not taking: Reported on 12/20/2022)   Dulaglutide 1.5 MG/0.5ML SOPN Inject 1.5 mg into the skin once a week. (Patient not taking: Reported on 01/05/2023)   No facility-administered medications prior to visit.    Allergies  Allergen Reactions   Codeine Itching    And hyper also   Hydromorphone Itching   Morphine And Related Itching    Patient Care Team: [name], PA-C as PCP - General (Physician Assistant) Kate Sable, MD as PCP - Cardiology (Cardiology) Anell Barr, OD (Optometry) Solum, Betsey Holiday, MD as Physician Assistant (Endocrinology) Abbie Sons, MD (Urology) Lyla Son, MD as Consulting Physician (Nephrology) Lin Landsman, MD as Consulting Physician (Gastroenterology) Vern Claude, LCSW as Social Worker  Review of Systems  Constitutional:  Negative for fatigue and fever.  HENT:  Positive for hearing loss and tinnitus.   Respiratory:  Negative for cough and shortness of breath.   Cardiovascular:  Negative for chest pain, palpitations and leg swelling.  Genitourinary:  Positive for frequency.  Neurological:  Negative for dizziness and headaches.       Objective    Vitals: BP (!) 171/67 (BP Location: Left Arm, Patient Position: Sitting, Cuff Size: Normal)   Pulse 67  Temp 97.7 F (36.5 C) (Oral)   Ht '5\' 10"'$  (1.778 m)   Wt 181 lb 12.8 oz (82.5 kg)   SpO2 99%   BMI 26.09 kg/m     Physical Exam Constitutional:      General: He is awake.     Appearance: He is well-developed.  HENT:     Head: Normocephalic.     Right Ear: Tympanic membrane, ear canal and external ear normal.     Left Ear: Tympanic membrane, ear canal and external ear normal.     Nose: Nose normal. No congestion or rhinorrhea.     Mouth/Throat:     Mouth: Mucous membranes  are moist.     Pharynx: No oropharyngeal exudate or posterior oropharyngeal erythema.  Eyes:     Pupils: Pupils are equal, round, and reactive to light.  Cardiovascular:     Rate and Rhythm: Normal rate and regular rhythm.     Heart sounds: Normal heart sounds.  Pulmonary:     Effort: Pulmonary effort is normal.     Breath sounds: Normal breath sounds.  Musculoskeletal:     Cervical back: Normal range of motion.     Right lower leg: No edema.     Left lower leg: No edema.  Lymphadenopathy:     Cervical: No cervical adenopathy.  Skin:    General: Skin is warm.  Neurological:     Mental Status: He is alert and oriented to person, place, and time.  Psychiatric:        Attention and Perception: Attention normal.        Mood and Affect: Mood normal.        Speech: Speech normal.        Behavior: Behavior normal. Behavior is cooperative.    Most recent functional status assessment:    01/05/2023   10:10 AM  In your present state of health, do you have any difficulty performing the following activities:  Hearing? 1  Vision? 0  Difficulty concentrating or making decisions? 1  Walking or climbing stairs? 1  Dressing or bathing? 0  Doing errands, shopping? 0   Most recent fall risk assessment:    01/05/2023   10:10 AM  Fall Risk   Falls in the past year? 0  Number falls in past yr: 0  Injury with Fall? 0    Most recent depression screenings:    01/05/2023   10:11 AM 08/13/2022    1:29 PM  PHQ 2/9 Scores  PHQ - 2 Score 0 0  PHQ- 9 Score 6 5   Most recent cognitive screening:    12/06/2019   10:18 AM  6CIT Screen  What Year? 0 points  What month? 0 points  What time? 0 points  Count back from 20 0 points  Months in reverse 0 points  Repeat phrase 0 points  Total Score 0 points   Most recent Audit-C alcohol use screening    01/05/2023   10:10 AM  Alcohol Use Disorder Test (AUDIT)  1. How often do you have a drink containing alcohol? 0  2. How many drinks  containing alcohol do you have on a typical day when you are drinking? 0  3. How often do you have six or more drinks on one occasion? 0  AUDIT-C Score 0   A score of 3 or more in women, and 4 or more in men indicates increased risk for alcohol abuse, EXCEPT if all of the points are from question 1  Results for orders placed or performed in visit on 01/05/23  Protein / creatinine ratio, urine  Result Value Ref Range   Creatinine, Urine 50    Albumin, U 171   Hemoglobin A1c  Result Value Ref Range   Hemoglobin A1C 5.6   Microalbumin / creatinine urine ratio  Result Value Ref Range   Microalb Creat Ratio 3,420.0     Assessment & Plan     Annual wellness visit done today including the all of the following: Reviewed patient's Family Medical History Reviewed and updated list of patient's medical providers Assessment of cognitive impairment was done Assessed patient's functional ability Established a written schedule for health screening [place] Completed and Reviewed  Exercise Activities and Dietary recommendations --balanced diet high in fiber and protein, low in sugars, carbs, fats. --physical activity/exercise 30 minutes 3-5 times a week  --Stop smoking   Immunization History  Administered Date(s) Administered   Td 06/07/2007   Tdap 06/07/2007, 07/17/2017    Health Maintenance  Topic Date Due   COVID-19 Vaccine (1) Never done   Zoster Vaccines- Shingrix (1 of 2) Never done   Pneumonia Vaccine 8+ Years old (1 of 1 - PCV) Never done   INFLUENZA VACCINE  02/06/2023 (Originally 06/08/2022)   Hepatitis C Screening  03/25/2023 (Originally [birth year]/1969)   HEMOGLOBIN A1C  03/14/2023   Diabetic kidney evaluation - Urine ACR  09/14/2023   Diabetic kidney evaluation - eGFR measurement  10/19/2023   OPHTHALMOLOGY EXAM  12/02/2023   FOOT EXAM  12/08/2023   Medicare Annual Wellness (AWV)  01/06/2024   COLONOSCOPY (Pts 45-30yr Insurance coverage will need to  be confirmed)  10/08/2024   DTaP/Tdap/Td (4 - Td or Tdap) 07/18/2027   HPV VACCINES  Aged Out     Discussed health benefits of physical activity, and encouraged him to engage in regular exercise appropriate for his age and condition.    Problem List Items Addressed This Visit       Cardiovascular and Mediastinum   Hypertension associated with diabetes ([place]    F/b cardio Resistant despite multiple medications  Reviewed cmp        Endocrine   Type 2 diabetes mellitus with stage 4 chronic kidney disease ([place]    Last A1c 11/23 5.6%. Pt has been unable to get his Trulicity d/t supply. Referred to pharmacy for assistance. Follows with endo Uacr utd, on ace/arb. Pt declines statin        Hyperlipidemia associated with type 2 diabetes mellitus ([place]    Pt was managed on lipitor and 7/23 LDL was < 100 but pt stopped as he felt it caused fatigue. Discussed alternatives, importance of statin compliance. Pt declines statin use.         Genitourinary   Chronic kidney disease, stage 4 (severe) ([place]    F/b nephrology. On farxiga Last gfr 24.  Pt aware to avoid nsaids, drink appropriate fluids        Other   Insomnia    2/2 chronic disease, stress, anxiety. Try trazodone 25-50 mg qhs prn       Relevant Medications   traZODone (DESYREL) 50 MG tablet   Tinnitus of both ears    Ref to ent as associated with hearing loss Recommended trying claritin otc       Relevant Orders   Ambulatory referral to ENT   Statin declined   Other Visit Diagnoses     Encounter for annual wellness exam in Medicare patient    -  Primary   Annual physical exam       Bilateral hearing loss, unspecified hearing loss type       Relevant Orders   Ambulatory referral to ENT      Reviewed all recent labs Declines vaccines today  Return in about 6 months (around 07/06/2023) for chronic conditions.     I, [name], PA-C have reviewed all documentation for this visit. The  documentation on  01/05/23  for the exam, diagnosis, procedures, and orders are all accurate and complete.  [name], PA-C [facility] [address] [place], [place], [address] Office: [phone] Fax: [place]

## 2023-01-05 NOTE — Assessment & Plan Note (Signed)
Last A1c 11/23 5.6%. Pt has been unable to get his Trulicity d/t supply. Referred to pharmacy for assistance. Follows with endo Uacr utd, on ace/arb. Pt declines statin

## 2023-01-05 NOTE — Assessment & Plan Note (Signed)
2/2 chronic disease, stress, anxiety. Try trazodone 25-50 mg qhs prn

## 2023-01-05 NOTE — Assessment & Plan Note (Addendum)
F/b nephrology. On farxiga Last gfr 24.  Pt aware to avoid nsaids, drink appropriate fluids

## 2023-01-05 NOTE — Assessment & Plan Note (Signed)
Ref to ent as associated with hearing loss Recommended trying claritin otc

## 2023-01-05 NOTE — Assessment & Plan Note (Signed)
F/b cardio Resistant despite multiple medications  Reviewed cmp

## 2023-01-05 NOTE — Assessment & Plan Note (Signed)
Pt was managed on lipitor and 7/23 LDL was < 100 but pt stopped as he felt it caused fatigue. Discussed alternatives, importance of statin compliance. Pt declines statin use.

## 2023-01-12 ENCOUNTER — Ambulatory Visit (INDEPENDENT_AMBULATORY_CARE_PROVIDER_SITE_OTHER): Payer: Medicare PPO

## 2023-01-12 VITALS — BP 128/64 | Ht 70.0 in | Wt 180.2 lb

## 2023-01-12 DIAGNOSIS — Z0000 Encounter for general adult medical examination without abnormal findings: Secondary | ICD-10-CM | POA: Diagnosis not present

## 2023-01-12 NOTE — Patient Instructions (Signed)
[name] , Thank you for taking time to come for your Medicare Wellness Visit. I appreciate your ongoing commitment to your health goals. Please review the following plan we discussed and let me know if I can assist you in the future.   These are the goals we discussed:  Goals      DIET - REDUCE SUGAR INTAKE     Recommend to continue current diet plan of cutting out all bad carbohydrates and sugar in diet to help aid in weight loss and diabetes control.     Exercise 3x per week (30 min per time)     Recommend to exercise for 3 days a week for at least 30 minutes at a time.         This is a list of the screening recommended for you and due dates:  Health Maintenance  Topic Date Due   COVID-19 Vaccine (1) Never done   Zoster (Shingles) Vaccine (1 of 2) Never done   Pneumonia Vaccine (1 of 1 - PCV) Never done   Flu Shot  02/06/2023*   Hepatitis C Screening: USPSTF Recommendation to screen - Ages 18-79 yo.  03/25/2023*   Hemoglobin A1C  03/14/2023   Yearly kidney health urinalysis for diabetes  09/14/2023   Yearly kidney function blood test for diabetes  10/19/2023   Eye exam for diabetics  12/02/2023   Complete foot exam   12/08/2023   Medicare Annual Wellness Visit  01/12/2024   Colon Cancer Screening  10/08/2024   DTaP/Tdap/Td vaccine (4 - Td or Tdap) 07/18/2027   HPV Vaccine  Aged Out  *Topic was postponed. The date shown is not the original due date.    Advanced directives: no  Conditions/risks identified: low falls risk  Next appointment: Follow up in one year for your annual wellness visit. 01/17/2024 @ 10am in person  Preventive Care 65 Years and Older, Male  Preventive care refers to lifestyle choices and visits with your health care provider that can promote health and wellness. What does preventive care include? A yearly physical exam. This is also called an annual well check. Dental exams once or twice a year. Routine eye exams. Ask your health care provider  how often you should have your eyes checked. Personal lifestyle choices, including: Daily care of your teeth and gums. Regular physical activity. Eating a healthy diet. Avoiding tobacco and drug use. Limiting alcohol use. Practicing safe sex. Taking low doses of aspirin every day. Taking vitamin and mineral supplements as recommended by your health care provider. What happens during an annual well check? The services and screenings done by your health care provider during your annual well check will depend on your age, overall health, lifestyle risk factors, and family history of disease. Counseling  Your health care provider may ask you questions about your: Alcohol use. Tobacco use. Drug use. Emotional well-being. Home and relationship well-being. Sexual activity. Eating habits. History of falls. Memory and ability to understand (cognition). Work and work Statistician. Screening  You may have the following tests or measurements: Height, weight, and BMI. Blood pressure. Lipid and cholesterol levels. These may be checked every 5 years, or more frequently if you are over 73 years old. Skin check. Lung cancer screening. You may have this screening every year starting at age 72 if you have a 30-pack-year history of smoking and currently smoke or have quit within the past 15 years. Fecal occult blood test (FOBT) of the stool. You may have this test every  year starting at age 72. Flexible sigmoidoscopy or colonoscopy. You may have a sigmoidoscopy every 5 years or a colonoscopy every 10 years starting at age 72. Prostate cancer screening. Recommendations will vary depending on your family history and other risks. Hepatitis C blood test. Hepatitis B blood test. Sexually transmitted disease (STD) testing. Diabetes screening. This is done by checking your blood sugar (glucose) after you have not eaten for a while (fasting). You may have this done every 1-3 years. Abdominal aortic aneurysm  (AAA) screening. You may need this if you are a current or former smoker. Osteoporosis. You may be screened starting at age 72 if you are at high risk. Talk with your health care provider about your test results, treatment options, and if necessary, the need for more tests. Vaccines  Your health care provider may recommend certain vaccines, such as: Influenza vaccine. This is recommended every year. Tetanus, diphtheria, and acellular pertussis (Tdap, Td) vaccine. You may need a Td booster every 10 years. Zoster vaccine. You may need this after age 22. Pneumococcal 13-valent conjugate (PCV13) vaccine. One dose is recommended after age 72. Pneumococcal polysaccharide (PPSV23) vaccine. One dose is recommended after age 72. Talk to your health care provider about which screenings and vaccines you need and how often you need them. This information is not intended to replace advice given to you by your health care provider. Make sure you discuss any questions you have with your health care provider. Document Released: 11/21/2015 Document Revised: 07/14/2016 Document Reviewed: 08/26/2015 Elsevier Interactive Patient Education  2017 [place] Prevention in the Home Falls can cause injuries. They can happen to people of all ages. There are many things you can do to make your home safe and to help prevent falls. What can I do on the outside of my home? Regularly fix the edges of walkways and driveways and fix any cracks. Remove anything that might make you trip as you walk through a door, such as a raised step or threshold. Trim any bushes or trees on the path to your home. Use bright outdoor lighting. Clear any walking paths of anything that might make someone trip, such as rocks or tools. Regularly check to see if handrails are loose or broken. Make sure that both sides of any steps have handrails. Any raised decks and porches should have guardrails on the edges. Have any leaves, snow, or  ice cleared regularly. Use sand or salt on walking paths during winter. Clean up any spills in your garage right away. This includes oil or grease spills. What can I do in the bathroom? Use night lights. Install grab bars by the toilet and in the tub and shower. Do not use towel bars as grab bars. Use non-skid mats or decals in the tub or shower. If you need to sit down in the shower, use a plastic, non-slip stool. Keep the floor dry. Clean up any water that spills on the floor as soon as it happens. Remove soap buildup in the tub or shower regularly. Attach bath mats securely with double-sided non-slip rug tape. Do not have throw rugs and other things on the floor that can make you trip. What can I do in the bedroom? Use night lights. Make sure that you have a light by your bed that is easy to reach. Do not use any sheets or blankets that are too big for your bed. They should not hang down onto the floor. Have a firm chair that has side  arms. You can use this for support while you get dressed. Do not have throw rugs and other things on the floor that can make you trip. What can I do in the kitchen? Clean up any spills right away. Avoid walking on wet floors. Keep items that you use a lot in easy-to-reach places. If you need to reach something above you, use a strong step stool that has a grab bar. Keep electrical cords out of the way. Do not use floor polish or wax that makes floors slippery. If you must use wax, use non-skid floor wax. Do not have throw rugs and other things on the floor that can make you trip. What can I do with my stairs? Do not leave any items on the stairs. Make sure that there are handrails on both sides of the stairs and use them. Fix handrails that are broken or loose. Make sure that handrails are as long as the stairways. Check any carpeting to make sure that it is firmly attached to the stairs. Fix any carpet that is loose or worn. Avoid having throw rugs at  the top or bottom of the stairs. If you do have throw rugs, attach them to the floor with carpet tape. Make sure that you have a light switch at the top of the stairs and the bottom of the stairs. If you do not have them, ask someone to add them for you. What else can I do to help prevent falls? Wear shoes that: Do not have high heels. Have rubber bottoms. Are comfortable and fit you well. Are closed at the toe. Do not wear sandals. If you use a stepladder: Make sure that it is fully opened. Do not climb a closed stepladder. Make sure that both sides of the stepladder are locked into place. Ask someone to hold it for you, if possible. Clearly mark and make sure that you can see: Any grab bars or handrails. First and last steps. Where the edge of each step is. Use tools that help you move around (mobility aids) if they are needed. These include: Canes. Walkers. Scooters. Crutches. Turn on the lights when you go into a dark area. Replace any light bulbs as soon as they burn out. Set up your furniture so you have a clear path. Avoid moving your furniture around. If any of your floors are uneven, fix them. If there are any pets around you, be aware of where they are. Review your medicines with your doctor. Some medicines can make you feel dizzy. This can increase your chance of falling. Ask your doctor what other things that you can do to help prevent falls. This information is not intended to replace advice given to you by your health care provider. Make sure you discuss any questions you have with your health care provider. Document Released: 08/21/2009 Document Revised: 04/01/2016 Document Reviewed: 11/29/2014 Elsevier Interactive Patient Education  2017 [employer].

## 2023-01-12 NOTE — Progress Notes (Signed)
Subjective:   Nicholas Nolan is a 72 y.o. male who presents for Medicare Annual/Subsequent preventive examination.  Review of Systems     Cardiac Risk Factors include: advanced age (>57mn, >>76women);diabetes mellitus;dyslipidemia;male gender;sedentary lifestyle;hypertension     Objective:    Today's Vitals   01/12/23 0950 01/12/23 0951  Weight: 180 lb 3.2 oz (81.7 kg)   Height: '5\' 10"'$  (1.778 m)   PainSc:  7    Body mass index is 25.86 kg/m.     01/12/2023   10:04 AM 10/18/2022    1:49 PM 09/13/2022   12:45 PM 09/06/2022    1:02 PM 08/30/2022   12:39 PM 08/20/2022    1:36 PM 10/08/2021    8:46 AM  Advanced Directives  Does Patient Have a Medical Advance Directive? Unable to assess, patient is non-responsive or altered mental status No No No No No No  Would patient like information on creating a medical advance directive?   No - Patient declined No - Patient declined No - Patient declined  No - Patient declined    Current Medications (verified) Outpatient Encounter Medications as of 01/12/2023  Medication Sig   amLODipine (NORVASC) 10 MG tablet Take 1 tablet (10 mg total) by mouth daily.   carvedilol (COREG) 6.25 MG tablet Take 1 tablet (6.25 mg total) by mouth 2 (two) times daily.   cilostazol (PLETAL) 50 MG tablet Take 1 tablet (50 mg total) by mouth 2 (two) times daily.   FARXIGA 10 MG TABS tablet Take 10 mg by mouth every morning.   finasteride (PROSCAR) 5 MG tablet TAKE 1 TABLET (5 MG TOTAL) BY MOUTH DAILY.   glucose blood (ONETOUCH VERIO) test strip    hydrALAZINE (APRESOLINE) 100 MG tablet Take 1 tablet (100 mg total) by mouth 3 (three) times daily.   olmesartan (BENICAR) 5 MG tablet Take 5 mg by mouth daily.   sertraline (ZOLOFT) 50 MG tablet TAKE 1 TABLET BY MOUTH EVERY DAY   tamsulosin (FLOMAX) 0.4 MG CAPS capsule TAKE 1 CAPSULE BY MOUTH EVERY DAY AFTER SUPPER   traZODone (DESYREL) 50 MG tablet Take 0.5-1 tablets (25-50 mg total) by mouth at bedtime as needed  for sleep.   aspirin EC 81 MG tablet Take 1 tablet (81 mg total) by mouth daily. Swallow whole. (Patient not taking: Reported on 12/20/2022)   Dulaglutide (TRULICITY) 1.5 M0000000SOPN Inject into the skin. (Patient not taking: Reported on 12/20/2022)   Dulaglutide 1.5 MG/0.5ML SOPN Inject 1.5 mg into the skin once a week. (Patient not taking: Reported on 01/05/2023)   No facility-administered encounter medications on file as of 01/12/2023.    Allergies (verified) Codeine, Hydromorphone, and Morphine and related   History: Past Medical History:  Diagnosis Date   Arthritis    hands   Back pain    leg pain   CAD (coronary artery disease)    a. 10/2019 Cor CTA: LM nl, LAD nl, LCX distal dzs w/ FFR 0.65-0.75. RCA nl-->Med Rx.   CKD (chronic kidney disease), stage III (HCC)    Claudication (HCC)    Diabetes mellitus    type 2   Diastolic dysfunction    a. 08/2019 Echo: EF 60-65%, mild LVH, impaired relaxation. Nl RV size/fxn. Nl LA szie.   History of urinary retention    Hyperlipidemia    Hypertension    a. 05/2019 renal u/s concerning for R RAS; b. 06/2019 Renal artery angio: no more than15% bilat RAS.   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   RUL Lobectomy   Wears dentures    full upper   Past Surgical History:  Procedure Laterality Date   BASAL CELL CARCINOMA EXCISION     COLONOSCOPY WITH PROPOFOL N/A 12/25/2019   Procedure: COLONOSCOPY WITH PROPOFOL;  Surgeon: Lin Landsman, MD;  Location: [facility];  Service: Endoscopy;  Laterality: N/A;  Diabetic - injectable and oral meds   COLONOSCOPY WITH PROPOFOL N/A 10/08/2021   Procedure: COLONOSCOPY WITH PROPOFOL;  Surgeon: [name], MD;  Location: [facility] ENDOSCOPY;  Service: Gastroenterology;  Laterality: N/A;   LUMBAR LAMINECTOMY/DECOMPRESSION MICRODISCECTOMY  01/10/2012   Procedure: LUMBAR LAMINECTOMY/DECOMPRESSION MICRODISCECTOMY 2 LEVELS;  Surgeon: Hosie Spangle, MD;  Location:  [facility] NEURO ORS;  Service: Neurosurgery;  Laterality: Bilateral;  Lumbar four-Sacral One laminectomies   LUNG SURGERY Right 11/22/12   right upper lobe   POLYPECTOMY N/A 12/25/2019   Procedure: POLYPECTOMY;  Surgeon: Lin Landsman, MD;  Location: [facility];  Service: Endoscopy;  Laterality: N/A;   PROSTATE SURGERY     biopsy-due to elelvated PSA   RENAL ANGIOGRAPHY Right 07/02/2019   Procedure: RENAL ANGIOGRAPHY;  Surgeon: Algernon Huxley, MD;  Location: [facility];  Service: Cardiovascular;  Laterality: Right;   SHOULDER SURGERY     VASECTOMY     Family History  Problem Relation Age of Onset   Cancer Father        prostate   Heart disease Father        MI   Stroke Father    Cancer Brother        liver   Social History   Socioeconomic History   Marital status: Married    Spouse name: Not on file   Number of children: 4   Years of education: Not on file   Highest education level: Associate degree: occupational, Hotel manager, or vocational program  Occupational History   Occupation: retired  Tobacco Use   Smoking status: Former    Packs/day: 2.00    Years: 45.00    Total pack years: 90.00    Types: Cigarettes    Quit date: 11/28/2012    Years since quitting: 10.1   Smokeless tobacco: Current    Types: Snuff  Vaping Use   Vaping Use: Never used  Substance and Sexual Activity   Alcohol use: No   Drug use: No   Sexual activity: Yes    Birth control/protection: None  Other Topics Concern   Not on file  Social History Narrative   Lives in with wife; daughter; mother [alzheimer]; quit smoking- 2015 [lung cancer dx]; no alcohol; 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    Social Determinants of Health   Financial Resource Strain: Low Risk  (01/12/2023)   Overall Financial Resource Strain (CARDIA)    Difficulty of Paying Living Expenses: Not hard at all  Food Insecurity: No Food Insecurity (02/25/2022)   Hunger Vital Sign    Worried About Running Out of Food in the  Last Year: Never true    Ran Out of Food in the Last Year: Never true  Transportation Needs: No Transportation Needs (01/12/2023)   PRAPARE - Hydrologist (Medical): No    Lack of Transportation (Non-Medical): No  Physical Activity: Inactive (01/12/2023)   Exercise Vital Sign    Days of Exercise per Week: 0 days    Minutes of Exercise per Session: 0 min  Stress: Stress Concern Present (01/12/2023)  [place] Questionnaire    Feeling of Stress : To some extent  Social Connections: Moderately Isolated (01/12/2023)   Social Connection and Isolation Panel [NHANES]    Frequency of Communication with Friends and Family: More than three times a week    Frequency of Social Gatherings with Friends and Family: More than three times a week    Attends Religious Services: Never    Marine scientist or Organizations: No    Attends Music therapist: Never    Marital Status: Married    Tobacco Counseling Ready to quit: Not Answered Counseling given: Not Answered   Clinical Intake:  Pre-visit preparation completed: Yes  Pain : 0-10 Pain Score: 7  Pain Type: Chronic pain Pain Location: Back Pain Descriptors / Indicators: Aching, Shooting, Spasm Pain Onset: More than a month ago Pain Frequency: Constant Pain Relieving Factors: moving around, tylenol sometimes, heat all a little  Pain Relieving Factors: moving around, tylenol sometimes, heat all a little  BMI - recorded: 25.86 Nutritional Status: BMI 25 -29 Overweight Nutritional Risks: None Diabetes: Yes CBG done?: No Did pt. bring in CBG monitor from home?: No  How often do you need to have someone help you when you read instructions, pamphlets, or other written materials from your doctor or pharmacy?: 1 - Never  Diabetic?yes  Interpreter Needed?: No  Information entered by :: B.Kamilla Hands,LPN   Activities of Daily Living    01/12/2023    10:04 AM 01/05/2023   10:10 AM  In your present state of health, do you have any difficulty performing the following activities:  Hearing? 1 1  Vision? 0 0  Difficulty concentrating or making decisions? 1 1  Walking or climbing stairs? 0 1  Comment pain   Dressing or bathing? 0 0  Doing errands, shopping? 0 0  Preparing Food and eating ? N   Using the Toilet? N   In the past six months, have you accidently leaked urine? N   Do you have problems with loss of bowel control? Y   Comment diarrhea due IBS   Managing your Medications? N   Managing your Finances? N   Housekeeping or managing your Housekeeping? N     Patient Care Team: [name], PA-C as PCP - General (Physician Assistant) Kate Sable, MD as PCP - Cardiology (Cardiology) Anell Barr, OD (Optometry) Solum, Betsey Holiday, MD as Physician Assistant (Endocrinology) Abbie Sons, MD (Urology) Lyla Son, MD as Consulting Physician (Nephrology) Lin Landsman, MD as Consulting Physician (Gastroenterology) Vern Claude, LCSW as Social Worker  Indicate any recent Medical Services you may have received from other than [facility] providers in the past year (date may be approximate).     Assessment:   This is a routine wellness examination for Sabrina.  Hearing/Vision screen Hearing Screening - Comments:: Hearing problems Declines referral Vision Screening - Comments:: Adequate vision  Wears readers Dr Ellin Mayhew  Dietary issues and exercise activities discussed: Current Exercise Habits: The patient does not participate in regular exercise at present, Exercise limited by: orthopedic condition(s)   Goals Addressed             This Visit's Progress    DIET - REDUCE SUGAR INTAKE   Not on track    Recommend to continue current diet plan of cutting out all bad carbohydrates and sugar in diet to help aid in weight loss and diabetes control.     Exercise 3x per week (  30 min per time)   Not on track     Recommend to exercise for 3 days a week for at least 30 minutes at a time.        Depression Screen    01/12/2023   10:00 AM 01/05/2023   10:11 AM 08/13/2022    1:29 PM 03/24/2022    1:08 PM 02/25/2022    2:07 PM 02/18/2022    3:48 PM 02/10/2022    3:35 PM  PHQ 2/9 Scores  PHQ - 2 Score 0 0 0 0 '2 4 6  '$ PHQ- 9 Score '4 6 5 4 5 19 21    '$ Fall Risk    01/12/2023    9:58 AM 01/05/2023   10:10 AM 02/10/2022    3:36 PM 06/09/2021    1:16 PM 12/31/2020    3:38 PM  Fall Risk   Falls in the past year? 0 0 0 0 0  Number falls in past yr: 0 0 0 0 0  Injury with Fall? 0 0 0 0 0  Risk for fall due to : No Fall Risks  No Fall Risks    Follow up Education provided;Falls prevention discussed        FALL RISK PREVENTION PERTAINING TO THE HOME:  Any stairs in or around the home? Yes  If so, are there any without handrails? Yes  Home free of loose throw rugs in walkways, pet beds, electrical cords, etc? Yes  Adequate lighting in your home to reduce risk of falls? Yes   ASSISTIVE DEVICES UTILIZED TO PREVENT FALLS:  Life alert? No  Use of a cane, walker or w/c? No  Grab bars in the bathroom? No  Shower chair or bench in shower? No  Elevated toilet seat or a handicapped toilet? Yes   TIMED UP AND GO:  Was the test performed? Yes .  Length of time to ambulate 10 feet: 15 sec.   Gait slow and steady without use of assistive device  Cognitive Function:        01/12/2023   10:07 AM 12/06/2019   10:18 AM 12/04/2018   10:58 AM  6CIT Screen  What Year? 0 points 0 points 0 points  What month? 0 points 0 points 0 points  What time? 0 points 0 points 0 points  Count back from 20 0 points 0 points 0 points  Months in reverse 0 points 0 points 0 points  Repeat phrase 0 points 0 points 0 points  Total Score 0 points 0 points 0 points    Immunizations Immunization History  Administered Date(s) Administered   Td 06/07/2007   Tdap 06/07/2007, 07/17/2017    TDAP status: Up to date  Flu  Vaccine status: Declined, Education has been provided regarding the importance of this vaccine but patient still declined. Advised may receive this vaccine at local pharmacy or Health Dept. Aware to provide a copy of the vaccination record if obtained from local pharmacy or Health Dept. Verbalized acceptance and understanding.  Pneumococcal vaccine status: Declined,  Education has been provided regarding the importance of this vaccine but patient still declined. Advised may receive this vaccine at local pharmacy or Health Dept. Aware to provide a copy of the vaccination record if obtained from local pharmacy or Health Dept. Verbalized acceptance and understanding.   Covid-19 vaccine status: Declined, Education has been provided regarding the importance of this vaccine but patient still declined. Advised may receive this vaccine at local pharmacy or Health Dept.or vaccine clinic. Aware  to provide a copy of the vaccination record if obtained from local pharmacy or Health Dept. Verbalized acceptance and understanding.  Qualifies for Shingles Vaccine? Yes   Zostavax completed No   Shingrix Completed?: No.    Education has been provided regarding the importance of this vaccine. Patient has been advised to call insurance company to determine out of pocket expense if they have not yet received this vaccine. Advised may also receive vaccine at local pharmacy or Health Dept. Verbalized acceptance and understanding.  Screening Tests Health Maintenance  Topic Date Due   COVID-19 Vaccine (1) Never done   Zoster Vaccines- Shingrix (1 of 2) Never done   Pneumonia Vaccine 7+ Years old (1 of 1 - PCV) Never done   INFLUENZA VACCINE  02/06/2023 (Originally 06/08/2022)   Hepatitis C Screening  03/25/2023 (Originally 04/05/1969)   HEMOGLOBIN A1C  03/14/2023   Diabetic kidney evaluation - Urine ACR  09/14/2023   Diabetic kidney evaluation - eGFR measurement  10/19/2023   OPHTHALMOLOGY EXAM  12/02/2023   FOOT EXAM   12/08/2023   Medicare Annual Wellness (AWV)  01/12/2024   COLONOSCOPY (Pts 45-70yr Insurance coverage will need to be confirmed)  10/08/2024   DTaP/Tdap/Td (4 - Td or Tdap) 07/18/2027   HPV VACCINES  Aged Out    Health Maintenance  Health Maintenance Due  Topic Date Due   COVID-19 Vaccine (1) Never done   Zoster Vaccines- Shingrix (1 of 2) Never done   Pneumonia Vaccine 72 Years old (1 of 1 - PCV) Never done    Colorectal cancer screening: Type of screening: Colonoscopy. Completed yes. Repeat every 5 years  Lung Cancer Screening: (Low Dose CT Chest recommended if Age 72-80years, 30 pack-year currently smoking OR have quit w/in 15years.) does qualify.   Lung Cancer Screening Referral: no done already  Additional Screening:  Hepatitis C Screening: does not qualify; Completed no  Vision Screening: Recommended annual ophthalmology exams for early detection of glaucoma and other disorders of the eye. Is the patient up to date with their annual eye exam?  Yes  Who is the provider or what is the name of the office in which the patient attends annual eye exams? Dr WEllin MayhewIf pt is not established with a provider, would they like to be referred to a provider to establish care? No .   Dental Screening: Recommended annual dental exams for proper oral hygiene  Community Resource Referral / Chronic Care Management: CRR required this visit?  No   CCM required this visit?  No      Plan:     I have personally reviewed and noted the following in the patient's chart:   Medical and social history Use of alcohol, tobacco or illicit drugs  Current medications and supplements including opioid prescriptions. Patient is not currently taking opioid prescriptions. Functional ability and status Nutritional status Physical activity Advanced directives List of other physicians Hospitalizations, surgeries, and ER visits in previous 12 months Vitals Screenings to include cognitive,  depression, and falls Referrals and appointments  In addition, I have reviewed and discussed with patient certain preventive protocols, quality metrics, and best practice recommendations. A written personalized care plan for preventive services as well as general preventive health recommendations were provided to patient.     [facility] LPN   [id number]  Nurse Notes: pt states he is doing alright other than dealing with more back pain the last couple of weeks. He states as he moves about it lessens but  still always in pain. Pt indicates he does not want to take medication due to kidney problems. He has no concerns or questions at this time.

## 2023-01-13 ENCOUNTER — Telehealth: Payer: Self-pay

## 2023-01-13 NOTE — Progress Notes (Signed)
  Chronic Care Management   Note  01/13/2023 Name: Nicholas Nolan MRN: [id number] DOB: 06/10/1951  [name] is a 72 y.o. year old male who is a primary care patient of [name], [place]. I reached out to [name] by phone today in response to a referral sent by [name]'s PCP.  [name] was given information about Chronic Care Management services today including:  CCM service includes personalized support from designated clinical staff supervised by the physician, including individualized plan of care and coordination with other care providers 24/7 contact phone numbers for assistance for urgent and routine care needs. Service will only be billed when office clinical staff spend 20 minutes or more in a month to coordinate care. Only one practitioner may furnish and bill the service in a calendar month. The patient may stop CCM services at amy time (effective at the end of the month) by phone call to the office staff. The patient will be responsible for cost sharing (co-pay) or up to 20% of the service fee (after annual deductible is met)  Mr. [name]  agreedto scheduling an appointment with the CCM RN Case Manager   Follow up plan: Patient agreed to scheduled appointment with RN Case Manager on 01/18/2023 pharm d 02/09/2023(date/time).   Noreene Larsson, [place], [place] [address] Direct Dial: [phone] [email or web address]

## 2023-01-14 ENCOUNTER — Other Ambulatory Visit: Payer: Self-pay

## 2023-01-14 DIAGNOSIS — D649 Anemia, unspecified: Secondary | ICD-10-CM

## 2023-01-17 ENCOUNTER — Inpatient Hospital Stay: Payer: Medicare PPO | Admitting: Internal Medicine

## 2023-01-17 ENCOUNTER — Inpatient Hospital Stay: Payer: Medicare PPO

## 2023-01-17 ENCOUNTER — Encounter: Payer: Self-pay | Admitting: Internal Medicine

## 2023-01-17 ENCOUNTER — Inpatient Hospital Stay: Payer: Medicare PPO | Attending: Internal Medicine

## 2023-01-17 VITALS — BP 145/49 | HR 64 | Temp 98.1°F

## 2023-01-17 VITALS — BP 162/61 | HR 66 | Temp 97.1°F | Resp 16 | Wt 182.8 lb

## 2023-01-17 DIAGNOSIS — D649 Anemia, unspecified: Secondary | ICD-10-CM

## 2023-01-17 DIAGNOSIS — N184 Chronic kidney disease, stage 4 (severe): Secondary | ICD-10-CM | POA: Insufficient documentation

## 2023-01-17 DIAGNOSIS — D509 Iron deficiency anemia, unspecified: Secondary | ICD-10-CM | POA: Insufficient documentation

## 2023-01-17 LAB — CBC WITH DIFFERENTIAL (CANCER CENTER ONLY)
Abs Immature Granulocytes: 0.01 10*3/uL (ref 0.00–0.07)
Basophils Absolute: 0.1 10*3/uL (ref 0.0–0.1)
Basophils Relative: 2 %
Eosinophils Absolute: 0.1 10*3/uL (ref 0.0–0.5)
Eosinophils Relative: 2 %
HCT: 25.6 % — ABNORMAL LOW (ref 39.0–52.0)
Hemoglobin: 8.5 g/dL — ABNORMAL LOW (ref 13.0–17.0)
Immature Granulocytes: 0 %
Lymphocytes Relative: 38 %
Lymphs Abs: 2.1 10*3/uL (ref 0.7–4.0)
MCH: 31 pg (ref 26.0–34.0)
MCHC: 33.2 g/dL (ref 30.0–36.0)
MCV: 93.4 fL (ref 80.0–100.0)
Monocytes Absolute: 0.4 10*3/uL (ref 0.1–1.0)
Monocytes Relative: 7 %
Neutro Abs: 2.9 10*3/uL (ref 1.7–7.7)
Neutrophils Relative %: 51 %
Platelet Count: 214 10*3/uL (ref 150–400)
RBC: 2.74 MIL/uL — ABNORMAL LOW (ref 4.22–5.81)
RDW: 12.1 % (ref 11.5–15.5)
WBC Count: 5.6 10*3/uL (ref 4.0–10.5)
nRBC: 0 % (ref 0.0–0.2)

## 2023-01-17 LAB — BASIC METABOLIC PANEL - CANCER CENTER ONLY
Anion gap: 6 (ref 5–15)
BUN: 58 mg/dL — ABNORMAL HIGH (ref 8–23)
CO2: 22 mmol/L (ref 22–32)
Calcium: 8.7 mg/dL — ABNORMAL LOW (ref 8.9–10.3)
Chloride: 109 mmol/L (ref 98–111)
Creatinine: 3.43 mg/dL — ABNORMAL HIGH (ref 0.61–1.24)
GFR, Estimated: 18 mL/min — ABNORMAL LOW (ref >60)
Glucose, Bld: 83 mg/dL (ref 70–99)
Potassium: 4.4 mmol/L (ref 3.5–5.1)
Sodium: 137 mmol/L (ref 135–145)

## 2023-01-17 LAB — IRON AND TIBC
Iron: 89 ug/dL (ref 45–182)
Saturation Ratios: 27 % (ref 17.9–39.5)
TIBC: 333 ug/dL (ref 250–450)
UIBC: 244 ug/dL

## 2023-01-17 LAB — LACTATE DEHYDROGENASE: LDH: 142 U/L (ref 98–192)

## 2023-01-17 LAB — FERRITIN: Ferritin: 78 ng/mL (ref 24–336)

## 2023-01-17 MED ORDER — SODIUM CHLORIDE 0.9 % IV SOLN
200.0000 mg | Freq: Once | INTRAVENOUS | Status: AC
  Administered 2023-01-17: 200 mg via INTRAVENOUS
  Filled 2023-01-17: qty 200

## 2023-01-17 MED ORDER — SODIUM CHLORIDE 0.9 % IV SOLN
Freq: Once | INTRAVENOUS | Status: AC
  Filled 2023-01-17: qty 250

## 2023-01-17 NOTE — Assessment & Plan Note (Addendum)
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 Recommend re-starting PO iron pills [no GI issues]. DEC 2023- MM - NEG;/L ight chains- 1.88- sec to CKD  # hemoglobin today- 8.5 - worse- proceed with venofer today. Also discussed the role of Retacrit boosting the hemoglobin.  However the goal hemoglobin less than 10.  Hold off any Retacrit injections at this time; consider at next viist.   #Hypertension-poorly [id number] s systolic- better/stable; recommend checking BP  regularly.   # CKD stage IV- [DM/HTN; Dr.Korrapati]-follow-up- GFR 18- stable.   # DM [Hb 123456- trulicity/ SGLT2 inhbitors- stable  # CHF- chronic [Dr.Etang]- stable  # DISPOSITION:  # venofer today; again in 2 weeks-  # Follow up in 1 month - MD: labs- cbc;bmp; LDH; possible venofer OR retacrit- Dr.B

## 2023-01-17 NOTE — Progress Notes (Signed)
Patient denies new problems/concerns today.   °

## 2023-01-17 NOTE — Progress Notes (Signed)
[facility] NOTE  Patient Care Team: [name], PA-C as PCP - General (Physician Assistant) Kate Sable, MD as PCP - Cardiology (Cardiology) Anell Barr, OD (Optometry) Solum, Betsey Holiday, MD as Physician Assistant (Endocrinology) Abbie Sons, MD (Urology) Lyla Son, MD as Consulting Physician (Nephrology) Lin Landsman, MD as Consulting Physician (Gastroenterology) Vern Claude, LCSW as Social Worker  CHIEF COMPLAINTS/PURPOSE OF CONSULTATION: ANEMIA   HEMATOLOGY HISTORY  # Hx of right Upper lobe lung cancer [[facility]]- no chemo/RT.   # ANEMIA[Hb; MCV-platelets- WBC; Iron sat; ferritin;  GFR- IV CT/US; EGD/colonoscopy-[2022- [facility]]  # CKD- IV[Dr.Korrapati]   Latest Reference Range & Units 08/13/22 14:04  Iron 38 - 169 ug/dL 67  UIBC 111 - 343 ug/dL 230  TIBC 250 - 450 ug/dL 297  Ferritin 30 - 400 ng/mL 28 (L)  Iron Saturation 15 - 55 % 23  Vitamin B12 232 - 1,245 pg/mL 629  Globulin, Total 1.5 - 4.5 g/dL 2.2  WBC 3.4 - 10.8 x10E3/uL 4.3  RBC 4.14 - 5.80 x10E6/uL 3.19 (L)  Hemoglobin 13.0 - 17.7 g/dL 9.7 (L)  HCT 37.5 - 51.0 % 28.8 (L)  MCV 79 - 97 fL 90  MCH 26.6 - 33.0 pg 30.4  MCHC 31.5 - 35.7 g/dL 33.7  RDW 11.6 - 15.4 % 12.5  Platelets 150 - 450 x10E3/uL 200  (L): Data is abnormally low  HISTORY OF PRESENTING ILLNESS: Alone alone.  Ambulating independently.  Nicholas Nolan 72 y.o.  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   Patient is currently status post weekly IV iron infusions x 4.  Notes for improvement of energy levels.  Improvement of cold intolerance and also sleeping better at nighttime [restless legs].   Currently on PO iron a day.  However more tired recently.  Shortness of breath on exertion.    Review of Systems   Constitutional:  Positive for malaise/fatigue. Negative for chills, diaphoresis, fever and weight loss.  HENT:  Negative for nosebleeds and sore throat.   Eyes:  Negative for double vision.  Respiratory:  Positive for shortness of breath. Negative for cough, hemoptysis, sputum production and wheezing.   Cardiovascular:  Negative for chest pain, palpitations, orthopnea and leg swelling.  Gastrointestinal:  Negative for abdominal pain, blood in stool, constipation, diarrhea, heartburn, melena, nausea and vomiting.  Genitourinary:  Negative for dysuria, frequency and urgency.  Musculoskeletal:  Negative for back pain and joint pain.  Skin: Negative.  Negative for itching and rash.  Neurological:  Positive for tingling. Negative for dizziness, focal weakness, weakness and headaches.  Endo/Heme/Allergies:  Does not bruise/bleed easily.  Psychiatric/Behavioral:  Negative for depression. The patient is not nervous/anxious and does not have insomnia.      MEDICAL HISTORY:  Past Medical History:  Diagnosis Date   Arthritis    hands   Back pain    leg pain   CAD (coronary artery disease)    a. 10/2019 Cor CTA: LM nl, LAD nl, LCX distal dzs w/ FFR 0.65-0.75. RCA nl-->Med Rx.   CKD (chronic kidney disease), stage III (HCC)    Claudication (HCC)    Diabetes mellitus    type 2   Diastolic dysfunction    a. 08/2019 Echo: EF 60-65%, mild LVH, impaired relaxation. Nl RV size/fxn. Nl LA szie.   History of urinary retention  Hyperlipidemia    Hypertension    a. 05/2019 renal u/s concerning for R RAS; b. 06/2019 Renal artery angio: no more than15% bilat RAS.   Neuropathy    feet   Primary cancer of right upper lobe of lung ([facility]) 2014   RUL Lobectomy   Wears dentures    full upper    SURGICAL HISTORY: Past Surgical History:  Procedure Laterality Date   BASAL CELL CARCINOMA EXCISION     COLONOSCOPY WITH PROPOFOL N/A 12/25/2019   Procedure: COLONOSCOPY WITH PROPOFOL;  Surgeon: Lin Landsman, MD;  Location: [facility];  Service: Endoscopy;  Laterality: N/A;  Diabetic - injectable and oral meds   COLONOSCOPY WITH PROPOFOL N/A 10/08/2021   Procedure: COLONOSCOPY WITH PROPOFOL;  Surgeon: [name], MD;  Location[facility];  Service: Gastroenterology;  Laterality: N/A;   LUMBAR LAMINECTOMY/DECOMPRESSION MICRODISCECTOMY  01/10/2012   Procedure: LUMBAR LAMINECTOMY/DECOMPRESSION MICRODISCECTOMY 2 LEVELS;  Surgeon: Hosie Spangle, MD;  Location: [facility]UR[facility];  Service: Neurosurgery;  Laterality: Bilateral;  Lumbar four-Sacral One laminectomies   LUNG SURGERY Right 11/22/12   right upper lobe   POLYPECTOMY N/A 12/25/2019   Procedure: POLYPECTOMY;  Surgeon: Lin Landsman, MD;  Location: [facility];  Service: Endoscopy;  Laterality: N/A;   PROSTATE SURGERY     biopsy-due to elelvated PSA   RENAL ANGIOGRAPHY Right 07/02/2019   Procedure: RENAL ANGIOGRAPHY;  Surgeon: Algernon Huxley, MD;  Location: [facility];  Service: Cardiovascular;  Laterality: Right;   SHOULDER SURGERY     VASECTOMY      SOCIAL HISTORY: Social History   Socioeconomic History   Marital status: Married    Spouse name: Not on file   Number of children: 4   Years of education: Not on file   Highest education level: Associate degree: occupational, Hotel manager, or vocational program  Occupational History   Occupation: retired  Tobacco Use   Smoking status: Former    Packs/day: 2.00    Years: 45.00    Total pack years: 90.00    Types: Cigarettes    Quit date: 11/28/2012    Years since quitting: 10.1   Smokeless tobacco: Current    Types: Snuff  Vaping Use   Vaping Use: Never used  Substance and Sexual Activity   Alcohol use: No   Drug use: No   Sexual activity: Yes    Birth control/protection: None  Other Topics Concern   Not on file  Social History Narrative   Lives in with wife; daughter; mother [alzheimer]; quit smoking- 2015 [lung cancer dx]; no alcohol; 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    Social Determinants of Health   Financial Resource Strain: Low Risk  (01/12/2023)   Overall Financial Resource Strain (CARDIA)    Difficulty of Paying Living Expenses: Not hard at all  Food Insecurity: No Food Insecurity (02/25/2022)   Hunger Vital Sign    Worried About Running Out of Food in the Last Year: Never true    Ran Out of Food in the Last Year: Never true  Transportation Needs: No Transportation Needs (01/12/2023)   PRAPARE - Hydrologist (Medical): No    Lack of Transportation (Non-Medical): No  Physical Activity: Inactive (01/12/2023)   Exercise Vital Sign    Days of Exercise per Week: 0 days    Minutes of Exercise per Session: 0 min  Stress: Stress Concern Present (01/12/2023)   [place]  Feeling of Stress : To some extent  Social Connections: Moderately Isolated (01/12/2023)   Social Connection and Isolation Panel [NHANES]    Frequency of Communication with Friends and Family: More than three times a week    Frequency of Social Gatherings with Friends and Family: More than three times a week    Attends Religious Services: Never    Marine scientist or Organizations: No    Attends Archivist Meetings: Never    Marital Status: Married  Human resources officer Violence: Not At Risk (01/12/2023)   Humiliation, Afraid, Rape, and Kick questionnaire    Fear of Current or Ex-Partner: No    Emotionally Abused: No    Physically Abused: No    Sexually Abused: No    FAMILY HISTORY: Family History  Problem Relation Age of Onset   Cancer Father        prostate   Heart disease Father        MI   Stroke Father    Cancer Brother        liver    ALLERGIES:  is allergic to codeine, hydromorphone, and morphine and related.  MEDICATIONS:  Current Outpatient Medications  Medication Sig Dispense Refill   amLODipine (NORVASC) 10 MG tablet Take 1 tablet  (10 mg total) by mouth daily. 90 tablet 3   carvedilol (COREG) 6.25 MG tablet Take 1 tablet (6.25 mg total) by mouth 2 (two) times daily. 180 tablet 0   cilostazol (PLETAL) 50 MG tablet Take 1 tablet (50 mg total) by mouth 2 (two) times daily. 180 tablet 0   Dulaglutide 1.5 MG/0.5ML SOPN Inject 1.5 mg into the skin once a week.     FARXIGA 10 MG TABS tablet Take 10 mg by mouth every morning.     finasteride (PROSCAR) 5 MG tablet TAKE 1 TABLET (5 MG TOTAL) BY MOUTH DAILY. 90 tablet 3   glucose blood (ONETOUCH VERIO) test strip      hydrALAZINE (APRESOLINE) 100 MG tablet Take 1 tablet (100 mg total) by mouth 3 (three) times daily. 270 tablet 3   olmesartan (BENICAR) 5 MG tablet Take 5 mg by mouth daily.     sertraline (ZOLOFT) 50 MG tablet TAKE 1 TABLET BY MOUTH EVERY DAY 90 tablet 1   tamsulosin (FLOMAX) 0.4 MG CAPS capsule TAKE 1 CAPSULE BY MOUTH EVERY DAY AFTER SUPPER 90 capsule 1   traZODone (DESYREL) 50 MG tablet Take 0.5-1 tablets (25-50 mg total) by mouth at bedtime as needed for sleep. 90 tablet 1   aspirin EC 81 MG tablet Take 1 tablet (81 mg total) by mouth daily. Swallow whole. (Patient not taking: Reported on 12/20/2022) 90 tablet 3   Dulaglutide (TRULICITY) 1.5 0000000 SOPN Inject into the skin. (Patient not taking: Reported on 12/20/2022)     No current facility-administered medications for this visit.   Facility-Administered Medications Ordered in Other Visits  Medication Dose Route Frequency Provider Last Rate Last Admin   iron sucrose (VENOFER) 200 mg in sodium chloride 0.9 % 100 mL IVPB  200 mg Intravenous Once Cammie Sickle, MD 440 mL/hr at 01/17/23 1356 200 mg at 01/17/23 1356     .  PHYSICAL EXAMINATION:   Vitals:   01/17/23 1300  BP: (!) 162/61  Pulse: 66  Resp: 16  Temp: (!) 97.1 F (36.2 C)   Filed Weights   01/17/23 1300  Weight: 182 lb 12.8 oz (82.9 kg)    Physical Exam Vitals and nursing note reviewed.  HENT:     Head: Normocephalic and  atraumatic.     Mouth/Throat:     Pharynx: Oropharynx is clear.  Eyes:     Extraocular Movements: Extraocular movements intact.     Pupils: Pupils are equal, round, and reactive to light.  Cardiovascular:     Rate and Rhythm: Normal rate and regular rhythm.  Pulmonary:     Comments: Decreased breath sounds bilaterally.  Abdominal:     Palpations: Abdomen is soft.  Musculoskeletal:        General: Normal range of motion.     Cervical back: Normal range of motion.  Skin:    General: Skin is warm.  Neurological:     General: No focal deficit present.     Mental Status: He is alert and oriented to person, place, and time.  Psychiatric:        Behavior: Behavior normal.        Judgment: Judgment normal.      LABORATORY DATA:  I have reviewed the data as listed Lab Results  Component Value Date   WBC 5.6 01/17/2023   HGB 8.5 (L) 01/17/2023   HCT 25.6 (L) 01/17/2023   MCV 93.4 01/17/2023   PLT 214 01/17/2023   Recent Labs    08/13/22 1404 10/18/22 1320 01/17/23 1247  NA 138 140 137  K 4.4 4.5 4.4  CL 104 109 109  CO2 20 21* 22  GLUCOSE 243* 107* 83  BUN 49* 57* 58*  CREATININE 2.84* 2.77* 3.43*  CALCIUM 8.3* 8.4* 8.7*  GFRNONAA  --  24* 18*  PROT 6.2  --   --   ALBUMIN 4.0  --   --   AST 9  --   --   ALT 8  --   --   ALKPHOS 85  --   --   BILITOT <0.2  --   --      No results found.  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 Recommend re-starting PO iron pills [no GI issues]. DEC 2023- MM - NEG;/L ight chains- 1.88- sec to CKD  # hemoglobin today- 8.5 - worse- proceed with venofer today. Also discussed the role of Retacrit boosting the hemoglobin.  However the goal hemoglobin less than 10.  Hold off any Retacrit injections at this time; consider at next viist.   #Hypertension-poorly [id number] s  systolic- better/stable; recommend checking BP  regularly.   # CKD stage IV- [DM/HTN; Dr.Korrapati]-follow-up- GFR 18- stable.   # DM [Hb 123456- trulicity/ SGLT2 inhbitors- stable  # CHF- chronic [Dr.Etang]- stable  # DISPOSITION:  # venofer today; again in 2 weeks-  # Follow up in 1 month - MD: labs- cbc;bmp; LDH; possible venofer OR retacrit- Dr.B   All questions were answered. The patient knows to call the clinic with any problems, questions or concerns.    Cammie Sickle, MD 01/17/2023 2:09 PM

## 2023-01-18 ENCOUNTER — Telehealth: Payer: Medicare PPO

## 2023-01-18 ENCOUNTER — Telehealth: Payer: Self-pay

## 2023-01-18 DIAGNOSIS — H6983 Other specified disorders of Eustachian tube, bilateral: Secondary | ICD-10-CM | POA: Diagnosis not present

## 2023-01-18 DIAGNOSIS — I10 Essential (primary) hypertension: Secondary | ICD-10-CM | POA: Diagnosis not present

## 2023-01-18 DIAGNOSIS — J302 Other seasonal allergic rhinitis: Secondary | ICD-10-CM | POA: Diagnosis not present

## 2023-01-18 DIAGNOSIS — N400 Enlarged prostate without lower urinary tract symptoms: Secondary | ICD-10-CM | POA: Diagnosis not present

## 2023-01-18 DIAGNOSIS — R809 Proteinuria, unspecified: Secondary | ICD-10-CM | POA: Diagnosis not present

## 2023-01-18 DIAGNOSIS — H90A32 Mixed conductive and sensorineural hearing loss, unilateral, left ear with restricted hearing on the contralateral side: Secondary | ICD-10-CM | POA: Diagnosis not present

## 2023-01-18 DIAGNOSIS — N2581 Secondary hyperparathyroidism of renal origin: Secondary | ICD-10-CM | POA: Diagnosis not present

## 2023-01-18 DIAGNOSIS — H903 Sensorineural hearing loss, bilateral: Secondary | ICD-10-CM | POA: Diagnosis not present

## 2023-01-18 DIAGNOSIS — D631 Anemia in chronic kidney disease: Secondary | ICD-10-CM | POA: Diagnosis not present

## 2023-01-18 DIAGNOSIS — E875 Hyperkalemia: Secondary | ICD-10-CM | POA: Diagnosis not present

## 2023-01-18 DIAGNOSIS — K529 Noninfective gastroenteritis and colitis, unspecified: Secondary | ICD-10-CM | POA: Diagnosis not present

## 2023-01-18 DIAGNOSIS — E1122 Type 2 diabetes mellitus with diabetic chronic kidney disease: Secondary | ICD-10-CM | POA: Diagnosis not present

## 2023-01-18 DIAGNOSIS — N184 Chronic kidney disease, stage 4 (severe): Secondary | ICD-10-CM | POA: Diagnosis not present

## 2023-01-18 NOTE — Telephone Encounter (Signed)
   CCM RN Visit Note   01/18/23 Name: Nicholas Nolan MRN: [id number]      DOB: [birth year]  Subjective: Nicholas Nolan is a 72 y.o. year old male who is a primary care patient of [name], [place]. The patient was referred to the Chronic Care Management team for assistance with care management needs subsequent to provider initiation of CCM services and plan of care.      An unsuccessful outreach attempt was made today for a scheduled CCM visit.   PLAN: Unable to leave a voice message.  Received voice prompt that voice mailbox could not record the message. The care management team will make additional outreach attempts.   Horris Latino RN Care Manager/Chronic Care Management [phone]

## 2023-01-25 DIAGNOSIS — D631 Anemia in chronic kidney disease: Secondary | ICD-10-CM | POA: Diagnosis not present

## 2023-01-25 DIAGNOSIS — N2581 Secondary hyperparathyroidism of renal origin: Secondary | ICD-10-CM | POA: Diagnosis not present

## 2023-01-25 DIAGNOSIS — N400 Enlarged prostate without lower urinary tract symptoms: Secondary | ICD-10-CM | POA: Diagnosis not present

## 2023-01-25 DIAGNOSIS — N184 Chronic kidney disease, stage 4 (severe): Secondary | ICD-10-CM | POA: Diagnosis not present

## 2023-01-25 DIAGNOSIS — E875 Hyperkalemia: Secondary | ICD-10-CM | POA: Diagnosis not present

## 2023-01-25 DIAGNOSIS — R809 Proteinuria, unspecified: Secondary | ICD-10-CM | POA: Diagnosis not present

## 2023-01-25 DIAGNOSIS — E1122 Type 2 diabetes mellitus with diabetic chronic kidney disease: Secondary | ICD-10-CM | POA: Diagnosis not present

## 2023-01-25 DIAGNOSIS — K529 Noninfective gastroenteritis and colitis, unspecified: Secondary | ICD-10-CM | POA: Diagnosis not present

## 2023-01-25 DIAGNOSIS — I10 Essential (primary) hypertension: Secondary | ICD-10-CM | POA: Diagnosis not present

## 2023-01-27 ENCOUNTER — Telehealth: Payer: Self-pay | Admitting: *Deleted

## 2023-01-27 NOTE — Telephone Encounter (Signed)
Received a message from patient wife that she wants patient to see Dr B because patient has last significant weight this past week alone. I attempted to call her back for more information and I got her voice mail and left message to call me back

## 2023-01-27 NOTE — Telephone Encounter (Signed)
Wife returned call but was unable answer all questions and said that patient is not there to answer them either. She states that he has had vomiting twice a day for the past month and that he has lost 7 pounds this past week, she doe snot know how much in the past month he has lost, just that he keeps getting smaller and smaller. She does state that he has had diarrhea also but does not know how much or the consistency of it.She is wanting him to come in for evaluation Please advise.  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  Patient has a GFR of around 18 cc/min. He was recently started on Benicar. His latest hemoglobin 8.3 and has been seeing hematology and has been receiving iron infusions through their office. He has been more careful with his diet and lost 7 pounds since last visit. Patient has chronic diarrhea. He has an average 6-7 bowel movements daily. Has been seeing gastroenterology and had stool studies done which were negative.  He was started back on Benicar at 5 mg daily and has been tolerating well. He denies any abdominal pain, nausea or vomiting. Denies any use of nonsteroidal anti-inflammatory drugs. He has been more careful with his diet at this time. "

## 2023-01-28 MED FILL — Iron Sucrose Inj 20 MG/ML (Fe Equiv): INTRAVENOUS | Qty: 10 | Status: AC

## 2023-01-31 ENCOUNTER — Inpatient Hospital Stay: Payer: Medicare PPO

## 2023-01-31 ENCOUNTER — Inpatient Hospital Stay: Payer: Medicare PPO | Admitting: Nurse Practitioner

## 2023-01-31 ENCOUNTER — Telehealth: Payer: Self-pay

## 2023-01-31 NOTE — Progress Notes (Signed)
  Chronic Care Management Note  01/31/2023 Name: Nicholas Nolan MRN: [id number] DOB: [birth year]  EGAN HENDLER is a 72 y.o. year old male who is a primary care patient of Thedore Mins, Delman Cheadle and is actively engaged with the Chronic Care Management team. I reached out to [name] by phone today to assist with re-scheduling an initial visit with the RN Case Manager  Follow up plan: Unsuccessful telephone outreach attempt made. A HIPAA compliant phone message was left for the patient providing contact information and requesting a return call.  The care management team will reach out to the patient again over the next 7 days.  If patient returns call to provider office, please advise to call [facility]  at [place], [place],  [address] Direct Dial: [phone] [email or web address]

## 2023-02-02 ENCOUNTER — Other Ambulatory Visit: Payer: Self-pay | Admitting: *Deleted

## 2023-02-02 MED ORDER — CILOSTAZOL 50 MG PO TABS: 50.0000 mg | ORAL_TABLET | Freq: Two times a day (BID) | ORAL | 0 refills | Status: DC

## 2023-02-03 ENCOUNTER — Other Ambulatory Visit: Payer: Self-pay

## 2023-02-03 MED ORDER — CARVEDILOL 6.25 MG PO TABS: 6.2500 mg | ORAL_TABLET | Freq: Two times a day (BID) | ORAL | 1 refills | Status: DC

## 2023-02-04 ENCOUNTER — Ambulatory Visit: Payer: Medicare PPO | Attending: Cardiology | Admitting: Cardiology

## 2023-02-04 ENCOUNTER — Encounter: Payer: Self-pay | Admitting: Cardiology

## 2023-02-04 VITALS — BP 130/50 | HR 63 | Ht 70.0 in | Wt 169.8 lb

## 2023-02-04 DIAGNOSIS — I10 Essential (primary) hypertension: Secondary | ICD-10-CM

## 2023-02-04 DIAGNOSIS — I2510 Atherosclerotic heart disease of native coronary artery without angina pectoris: Secondary | ICD-10-CM

## 2023-02-04 NOTE — Patient Instructions (Signed)
Medication Instructions:   Your physician recommends that you continue on your current medications as directed. Please refer to the Current Medication list given to you today.  *If you need a refill on your cardiac medications before your next appointment, please call your pharmacy*   Lab Work:  None ordered  If you have labs (blood work) drawn today and your tests are completely normal, you will receive your results only by: MyChart Message (if you have MyChart) OR A paper copy in the mail If you have any lab test that is abnormal or we need to change your treatment, we will call you to review the results.   Testing/Procedures:  None Ordered    Follow-Up: At [facility], you and your health needs are our priority.  As part of our continuing mission to provide you with exceptional heart care, we have created designated Provider Care Teams.  These Care Teams include your primary Cardiologist (physician) and Advanced Practice Providers (APPs -  Physician Assistants and Nurse Practitioners) who all work together to provide you with the care you need, when you need it.  We recommend signing up for the patient portal called "MyChart".  Sign up information is provided on this After Visit Summary.  MyChart is used to connect with patients for Virtual Visits (Telemedicine).  Patients are able to view lab/test results, encounter notes, upcoming appointments, etc.  Non-urgent messages can be sent to your provider as well.   To learn more about what you can do with MyChart, go to [email or web address].    Your next appointment:   6 month(s)  Provider:   You may see Brian Agbor-Etang, MD or one of the following Advanced Practice Providers on your designated Care Team:   Christopher Berge, NP Ryan Dunn, PA-C Cadence Furth, PA-C Sheri Hammock, NP 

## 2023-02-04 NOTE — Progress Notes (Signed)
Cardiology Office Note:    Date:  02/04/2023   ID:  Nicholas Nolan, DOB 06-Sep-1951, MRN [id number]  PCP:  [name], PA-C  Cardiologist:  Kate Sable, MD  Electrophysiologist:  None   Referring MD: [name], PA-C   Chief Complaint  Patient presents with   Follow-up    6 week f/u, reports 140s/60s at home     History of Present Illness:    Nicholas Nolan is a 72 y.o. male with a hx of CAD (CCTA 10/2019-Ca score 1689, mild LAD, LCx disease, FFRct with no sig obstruction), PAD, CKD, hypertension, diabetes, hyperlipidemia, former smoker x40+ years who presents for follow-up.  Being seen for hypertension.  Hydralazine increased to 100 mg 3 times daily at previous visit.  States feeling a little tired with increased dose of hydralazine.  Blood pressures at home have improved, systolic usually in the [id number].  No new concerns at this time.   Prior notes  Renal ultrasound and renal angiogram were performed 06/2019 to evaluate for possible secondary causes but that was normal  Echo 08/2019 showed normal systolic function, EF 60 to 65%, impaired relaxation, mild LVH. PAD, evaluated by interventional cardiology, angio being deferred for now due to risk of CIN   Past Medical History:  Diagnosis Date   Arthritis    hands   Back pain    leg pain   CAD (coronary artery disease)    a. 10/2019 Cor CTA: LM nl, LAD nl, LCX distal dzs w/ FFR 0.65-0.75. RCA nl-->Med Rx.   CKD (chronic kidney disease), stage III (HCC)    Claudication (HCC)    Diabetes mellitus    type 2   Diastolic dysfunction    a. 08/2019 Echo: EF 60-65%, mild LVH, impaired relaxation. Nl RV size/fxn. Nl LA szie.   History of urinary retention    Hyperlipidemia    Hypertension    a. 05/2019 renal u/s concerning for R RAS; b. 06/2019 Renal artery angio: no more than15% bilat RAS.   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   RUL Lobectomy   Wears dentures    full upper     Past Surgical History:  Procedure Laterality Date   BASAL CELL CARCINOMA EXCISION     COLONOSCOPY WITH PROPOFOL N/A 12/25/2019   Procedure: COLONOSCOPY WITH PROPOFOL;  Surgeon: Lin Landsman, MD;  Location: [place];  Service: Endoscopy;  Laterality: N/A;  Diabetic - injectable and oral meds   COLONOSCOPY WITH PROPOFOL N/A 10/08/2021   Procedure: COLONOSCOPY WITH PROPOFOL;  Surgeon: [name], MD;  Location: [facility] ENDOSCOPY;  Service: Gastroenterology;  Laterality: N/A;   LUMBAR LAMINECTOMY/DECOMPRESSION MICRODISCECTOMY  01/10/2012   Procedure: LUMBAR LAMINECTOMY/DECOMPRESSION MICRODISCECTOMY 2 LEVELS;  Surgeon: Hosie Spangle, MD;  Location: [place] NEURO ORS;  Service: Neurosurgery;  Laterality: Bilateral;  Lumbar four-Sacral One laminectomies   LUNG SURGERY Right 11/22/12   right upper lobe   POLYPECTOMY N/A 12/25/2019   Procedure: POLYPECTOMY;  Surgeon: Lin Landsman, MD;  Location: [place];  Service: Endoscopy;  Laterality: N/A;   PROSTATE SURGERY     biopsy-due to elelvated PSA   RENAL ANGIOGRAPHY Right 07/02/2019   Procedure: RENAL ANGIOGRAPHY;  Surgeon: Algernon Huxley, MD;  Location: [place] CV LAB;  Service: Cardiovascular;  Laterality: Right;   SHOULDER SURGERY     VASECTOMY      Current Medications: Current Meds  Medication Sig   amLODipine (  NORVASC) 10 MG tablet Take 1 tablet (10 mg total) by mouth daily.   carvedilol (COREG) 6.25 MG tablet Take 1 tablet (6.25 mg total) by mouth 2 (two) times daily.   cilostazol (PLETAL) 50 MG tablet Take 1 tablet (50 mg total) by mouth 2 (two) times daily.   Dulaglutide (TRULICITY) 1.5 0000000 SOPN Inject into the skin.   Dulaglutide 1.5 MG/0.5ML SOPN Inject 1.5 mg into the skin once a week.   FARXIGA 10 MG TABS tablet Take 10 mg by mouth every morning.   finasteride (PROSCAR) 5 MG tablet TAKE 1 TABLET (5 MG TOTAL) BY MOUTH DAILY.   glucose blood (ONETOUCH VERIO) test strip    hydrALAZINE (APRESOLINE)  100 MG tablet Take 1 tablet (100 mg total) by mouth 3 (three) times daily.   olmesartan (BENICAR) 5 MG tablet Take 5 mg by mouth daily.   sertraline (ZOLOFT) 50 MG tablet TAKE 1 TABLET BY MOUTH EVERY DAY   tamsulosin (FLOMAX) 0.4 MG CAPS capsule TAKE 1 CAPSULE BY MOUTH EVERY DAY AFTER SUPPER   traZODone (DESYREL) 50 MG tablet Take 0.5-1 tablets (25-50 mg total) by mouth at bedtime as needed for sleep.     Allergies:   Codeine, Hydromorphone, and Morphine and related   Social History   Socioeconomic History   Marital status: Married    Spouse name: Not on file   Number of children: 4   Years of education: Not on file   Highest education level: Associate degree: occupational, Hotel manager, or vocational program  Occupational History   Occupation: retired  Tobacco Use   Smoking status: Former    Packs/day: 2.00    Years: 45.00    Additional pack years: 0.00    Total pack years: 90.00    Types: Cigarettes    Quit date: 11/28/2012    Years since quitting: 10.1   Smokeless tobacco: Current    Types: Snuff  Vaping Use   Vaping Use: Never used  Substance and Sexual Activity   Alcohol use: No   Drug use: No   Sexual activity: Yes    Birth control/protection: None  Other Topics Concern   Not on file  Social History Narrative   Lives in with wife; daughter; mother [alzheimer]; quit smoking- 2015 [lung cancer dx]; no alcohol; 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    Social Determinants of Health   Financial Resource Strain: Low Risk  (01/12/2023)   Overall Financial Resource Strain (CARDIA)    Difficulty of Paying Living Expenses: Not hard at all  Food Insecurity: No Food Insecurity (02/25/2022)   Hunger Vital Sign    Worried About Running Out of Food in the Last Year: Never true    Ran Out of Food in the Last Year: Never true  Transportation Needs: No Transportation Needs (01/12/2023)   PRAPARE - Hydrologist (Medical): No    Lack of Transportation (Non-Medical):  No  Physical Activity: Inactive (01/12/2023)   Exercise Vital Sign    Days of Exercise per Week: 0 days    Minutes of Exercise per Session: 0 min  Stress: Stress Concern Present (01/12/2023)   [place]    Feeling of Stress : To some extent  Social Connections: Moderately Isolated (01/12/2023)   Social Connection and Isolation Panel [NHANES]    Frequency of Communication with Friends and Family: More than three times a week    Frequency of Social Gatherings with Friends and Family: More  than three times a week    Attends Religious Services: Never    Active Member of Clubs or Organizations: No    Attends Archivist Meetings: Never    Marital Status: Married     Family History: The patient's family history includes Cancer in his brother and father; Heart disease in his father; Stroke in his father.  ROS:   Please see the history of present illness.     All other systems reviewed and are negative.  EKGs/Labs/Other Studies Reviewed:    The following studies were reviewed today:   EKG:  EKG not ordered today.   Recent Labs: 08/13/2022: ALT 8 01/17/2023: BUN 58; Creatinine 3.43; Hemoglobin 8.5; Platelet Count 214; Potassium 4.4; Sodium 137  Recent Lipid Panel    Component Value Date/Time   CHOL 126 08/21/2020 1246   CHOL 221 (H) 10/19/2019 1046   TRIG 89 08/21/2020 1246   HDL 48 08/21/2020 1246   HDL 43 10/19/2019 1046   CHOLHDL 2.6 08/21/2020 1246   VLDL 18 08/21/2020 1246   LDLCALC 60 08/21/2020 1246   LDLCALC 150 (H) 10/19/2019 1046    Physical Exam:    VS:  BP (!) 130/50 (BP Location: Left Arm, Patient Position: Sitting, Cuff Size: Normal)   Pulse 63   Ht 5\' 10"  (1.778 m)   Wt 169 lb 12.8 oz (77 kg)   SpO2 96%   BMI 24.36 kg/m     Wt Readings from Last 3 Encounters:  02/04/23 169 lb 12.8 oz (77 kg)  01/17/23 182 lb 12.8 oz (82.9 kg)  01/12/23 180 lb 3.2 oz (81.7 kg)     GEN:  Well  nourished, well developed in no acute distress HEENT: Normal NECK: No JVD; bilateral carotid bruit noted worse on the right. CARDIAC: RRR, no murmurs, rubs, gallops RESPIRATORY:  Clear to auscultation without rales, wheezing or rhonchi  ABDOMEN: Soft, non-tender, non-distended MUSCULOSKELETAL:  No edema; No deformity, extremities appear warm, DP difficult to palpate SKIN: Warm and dry NEUROLOGIC:  Alert and oriented x 3 PSYCHIATRIC:  Normal affect  ASSESSMENT:    1. Primary hypertension   2. Coronary artery disease involving native coronary artery of native heart without angina pectoris    PLAN:    In order of problems listed above:  Hypertension, BP controlled.  Continue hydralazine 100 mg 3 times daily, Norvasc, benazepril, Coreg.   nonobstructive CAD in LAD and RCA(Ca2+ 1698, FFR CT no stenosis).  Denies chest pain.  LDL at goal.  Continue aspirin, Lipitor, carvedilol.  Follow-up in 6 months  Medication Adjustments/Labs and Tests Ordered: Current medicines are reviewed at length with the patient today.  Concerns regarding medicines are outlined above.  No orders of the defined types were placed in this encounter.  No orders of the defined types were placed in this encounter.   Patient Instructions  Medication Instructions:   Your physician recommends that you continue on your current medications as directed. Please refer to the Current Medication list given to you today.  *If you need a refill on your cardiac medications before your next appointment, please call your pharmacy*   Lab Work:  None ordered  If you have labs (blood work) drawn today and your tests are completely normal, you will receive your results only by: [place] (if you have MyChart) OR A paper copy in the mail If you have any lab test that is abnormal or we need to change your treatment, we will call you to review  the results.   Testing/Procedures:  None Ordered   Follow-Up: At [facility], you and your health needs are our priority.  As part of our continuing mission to provide you with exceptional heart care, we have created designated Provider Care Teams.  These Care Teams include your primary Cardiologist (physician) and Advanced Practice Providers (APPs -  Physician Assistants and Nurse Practitioners) who all work together to provide you with the care you need, when you need it.  We recommend signing up for the patient portal called "MyChart".  Sign up information is provided on this After Visit Summary.  MyChart is used to connect with patients for Virtual Visits (Telemedicine).  Patients are able to view lab/test results, encounter notes, upcoming appointments, etc.  Non-urgent messages can be sent to your provider as well.   To learn more about what you can do with MyChart, go to [email or web address].    Your next appointment:   6 month(s)  Provider:   You may see Kate Sable, MD or one of the following Advanced Practice Providers on your designated Care Team:   Murray Hodgkins, NP Christell Faith, PA-C Cadence Kathlen Mody, PA-C Gerrie Nordmann, NP    Signed, Kate Sable, MD  02/04/2023 2:14 PM    [place]

## 2023-02-07 ENCOUNTER — Inpatient Hospital Stay: Payer: Medicare PPO

## 2023-02-07 ENCOUNTER — Telehealth: Payer: Medicare PPO

## 2023-02-07 ENCOUNTER — Telehealth: Payer: Self-pay

## 2023-02-07 ENCOUNTER — Other Ambulatory Visit: Payer: Self-pay

## 2023-02-07 ENCOUNTER — Encounter: Payer: Self-pay | Admitting: Nurse Practitioner

## 2023-02-07 ENCOUNTER — Inpatient Hospital Stay (HOSPITAL_BASED_OUTPATIENT_CLINIC_OR_DEPARTMENT_OTHER): Payer: Medicare PPO | Admitting: Nurse Practitioner

## 2023-02-07 ENCOUNTER — Inpatient Hospital Stay: Payer: Medicare PPO | Attending: Internal Medicine

## 2023-02-07 VITALS — BP 140/50 | HR 63 | Temp 97.6°F | Ht 70.0 in | Wt 171.2 lb

## 2023-02-07 VITALS — BP 147/50 | HR 56

## 2023-02-07 DIAGNOSIS — D509 Iron deficiency anemia, unspecified: Secondary | ICD-10-CM | POA: Diagnosis not present

## 2023-02-07 DIAGNOSIS — D649 Anemia, unspecified: Secondary | ICD-10-CM

## 2023-02-07 DIAGNOSIS — D631 Anemia in chronic kidney disease: Secondary | ICD-10-CM | POA: Insufficient documentation

## 2023-02-07 DIAGNOSIS — I130 Hypertensive heart and chronic kidney disease with heart failure and stage 1 through stage 4 chronic kidney disease, or unspecified chronic kidney disease: Secondary | ICD-10-CM | POA: Diagnosis not present

## 2023-02-07 DIAGNOSIS — N184 Chronic kidney disease, stage 4 (severe): Secondary | ICD-10-CM | POA: Diagnosis not present

## 2023-02-07 DIAGNOSIS — R634 Abnormal weight loss: Secondary | ICD-10-CM | POA: Insufficient documentation

## 2023-02-07 LAB — CBC WITH DIFFERENTIAL (CANCER CENTER ONLY)
Abs Immature Granulocytes: 0.01 10*3/uL (ref 0.00–0.07)
Basophils Absolute: 0 10*3/uL (ref 0.0–0.1)
Basophils Relative: 1 %
Eosinophils Absolute: 0.1 10*3/uL (ref 0.0–0.5)
Eosinophils Relative: 3 %
HCT: 27.8 % — ABNORMAL LOW (ref 39.0–52.0)
Hemoglobin: 9 g/dL — ABNORMAL LOW (ref 13.0–17.0)
Immature Granulocytes: 0 %
Lymphocytes Relative: 43 %
Lymphs Abs: 1.4 10*3/uL (ref 0.7–4.0)
MCH: 30 pg (ref 26.0–34.0)
MCHC: 32.4 g/dL (ref 30.0–36.0)
MCV: 92.7 fL (ref 80.0–100.0)
Monocytes Absolute: 0.3 10*3/uL (ref 0.1–1.0)
Monocytes Relative: 9 %
Neutro Abs: 1.4 10*3/uL — ABNORMAL LOW (ref 1.7–7.7)
Neutrophils Relative %: 44 %
Platelet Count: 152 10*3/uL (ref 150–400)
RBC: 3 MIL/uL — ABNORMAL LOW (ref 4.22–5.81)
RDW: 12.3 % (ref 11.5–15.5)
WBC Count: 3.3 10*3/uL — ABNORMAL LOW (ref 4.0–10.5)
nRBC: 0 % (ref 0.0–0.2)

## 2023-02-07 LAB — CMP (CANCER CENTER ONLY)
ALT: 8 U/L (ref 0–44)
AST: 11 U/L — ABNORMAL LOW (ref 15–41)
Albumin: 3.7 g/dL (ref 3.5–5.0)
Alkaline Phosphatase: 51 U/L (ref 38–126)
Anion gap: 6 (ref 5–15)
BUN: 75 mg/dL — ABNORMAL HIGH (ref 8–23)
CO2: 19 mmol/L — ABNORMAL LOW (ref 22–32)
Calcium: 8.4 mg/dL — ABNORMAL LOW (ref 8.9–10.3)
Chloride: 110 mmol/L (ref 98–111)
Creatinine: 4.08 mg/dL — ABNORMAL HIGH (ref 0.61–1.24)
GFR, Estimated: 15 mL/min — ABNORMAL LOW (ref >60)
Glucose, Bld: 107 mg/dL — ABNORMAL HIGH (ref 70–99)
Potassium: 4.9 mmol/L (ref 3.5–5.1)
Sodium: 135 mmol/L (ref 135–145)
Total Bilirubin: 0.3 mg/dL (ref 0.3–1.2)
Total Protein: 6.4 g/dL — ABNORMAL LOW (ref 6.5–8.1)

## 2023-02-07 LAB — LACTATE DEHYDROGENASE: LDH: 108 U/L (ref 98–192)

## 2023-02-07 MED ORDER — SODIUM CHLORIDE 0.9 % IV SOLN
200.0000 mg | Freq: Once | INTRAVENOUS | Status: AC
  Administered 2023-02-07: 200 mg via INTRAVENOUS
  Filled 2023-02-07: qty 200

## 2023-02-07 MED ORDER — SODIUM CHLORIDE 0.9 % IV SOLN
Freq: Once | INTRAVENOUS | Status: AC
  Filled 2023-02-07: qty 250

## 2023-02-07 NOTE — Progress Notes (Signed)
Cash NOTE  Patient Care Team: [name], PA-C as PCP - General (Physician Assistant) Kate Sable, MD as PCP - Cardiology (Cardiology) Anell Barr, OD (Optometry) Solum, Betsey Holiday, MD as Physician Assistant (Endocrinology) Abbie Sons, MD (Urology) Lyla Son, MD as Consulting Physician (Nephrology) Lin Landsman, MD as Consulting Physician (Gastroenterology) Vern Claude, LCSW as Social Worker  CHIEF COMPLAINTS/PURPOSE OF CONSULTATION: ANEMIA   HEMATOLOGY HISTORY  # Hx of right Upper lobe lung cancer [[facility]]- no chemo/RT.   # ANEMIA[Hb; MCV-platelets- WBC; Iron sat; ferritin;  GFR- IV CT/US; EGD/colonoscopy-[2022- [facility]]  # CKD- IV[Dr.Korrapati]   Latest Reference Range & Units 08/13/22 14:04  Iron 38 - 169 ug/dL 67  UIBC 111 - 343 ug/dL 230  TIBC 250 - 450 ug/dL 297  Ferritin 30 - 400 ng/mL 28 (L)  Iron Saturation 15 - 55 % 23  Vitamin B12 232 - 1,245 pg/mL 629  Globulin, Total 1.5 - 4.5 g/dL 2.2  WBC 3.4 - 10.8 x10E3/uL 4.3  RBC 4.14 - 5.80 x10E6/uL 3.19 (L)  Hemoglobin 13.0 - 17.7 g/dL 9.7 (L)  HCT 37.5 - 51.0 % 28.8 (L)  MCV 79 - 97 fL 90  MCH 26.6 - 33.0 pg 30.4  MCHC 31.5 - 35.7 g/dL 33.7  RDW 11.6 - 15.4 % 12.5  Platelets 150 - 450 x10E3/uL 200  (L): Data is abnormally low  HISTORY OF PRESENTING ILLNESS: Accompanied by spouse. Ambulating independently.  Nicholas Nolan 72 y.o.  male pleasant patient with hypertension, coronary artery disease, congestive heart failure, diabetes, peripheral vascular disease, and chronic kidney disease stage IV, with proteinuria, former smoker, and anemia who returns to clinic for concerns of unintentional weight loss.  He received IV iron at last visit with Dr. Rogue Bussing.  Per patient he has had fatigue since adjustment of his hydralazine to 3 times a day.  Ongoing cold intolerance.  Wife has noticed unintentional weight loss.  Per patient, he has been trying to be more  mindful of his diet and adhering to modifications for his kidneys, sugars, and heart.  He continues to take oral iron.  He has had an approximate 10 pound weight loss over the past month which she has accounted to adjustment in his diuretic medications.    Review of Systems  Constitutional:  Positive for malaise/fatigue and weight loss. Negative for chills, diaphoresis and fever.  HENT:  Negative for nosebleeds and sore throat.   Eyes:  Negative for double vision.  Respiratory:  Positive for shortness of breath. Negative for cough, hemoptysis, sputum production and wheezing.   Cardiovascular:  Negative for chest pain, palpitations, orthopnea and leg swelling.  Gastrointestinal:  Negative for abdominal pain, blood in stool, constipation, diarrhea, heartburn, melena, nausea and vomiting.  Genitourinary:  Negative for dysuria, frequency and urgency.  Musculoskeletal:  Positive for back pain. Negative for joint pain.  Skin:  Negative for itching and rash.  Neurological:  Negative for dizziness, tingling, focal weakness, weakness and headaches.  Endo/Heme/Allergies:  Does not bruise/bleed easily.  Psychiatric/Behavioral:  Negative for depression. The patient is not nervous/anxious and does not have insomnia.     MEDICAL HISTORY:  Past Medical History:  Diagnosis Date   Arthritis    hands   Back pain    leg pain   CAD (coronary artery disease)    a. 10/2019 Cor CTA: LM nl, LAD nl, LCX distal dzs w/ FFR 0.65-0.75. RCA nl-->Med Rx.   CKD (chronic kidney disease), stage  III    Claudication    Diabetes mellitus    type 2   Diastolic dysfunction    a. 08/2019 Echo: EF 60-65%, mild LVH, impaired relaxation. Nl RV size/fxn. Nl LA szie.   History of urinary retention    Hyperlipidemia    Hypertension    a. 05/2019 renal u/s concerning for R RAS; b. 06/2019 Renal artery angio: no more than15% bilat RAS.   Neuropathy    feet   Primary cancer of right upper lobe of lung 2014   RUL Lobectomy    Wears dentures    full upper    SURGICAL HISTORY: Past Surgical History:  Procedure Laterality Date   BASAL CELL CARCINOMA EXCISION     COLONOSCOPY WITH PROPOFOL N/A 12/25/2019   Procedure: COLONOSCOPY WITH PROPOFOL;  Surgeon: Lin Landsman, MD;  Location: [place];  Service: Endoscopy;  Laterality: N/A;  Diabetic - injectable and oral meds   COLONOSCOPY WITH PROPOFOL N/A 10/08/2021   Procedure: COLONOSCOPY WITH PROPOFOL;  Surgeon: [name], MD;  Location: [facility] ENDOSCOPY;  Service: Gastroenterology;  Laterality: N/A;   LUMBAR LAMINECTOMY/DECOMPRESSION MICRODISCECTOMY  01/10/2012   Procedure: LUMBAR LAMINECTOMY/DECOMPRESSION MICRODISCECTOMY 2 LEVELS;  Surgeon: Hosie Spangle, MD;  Location: [place] NEURO ORS;  Service: Neurosurgery;  Laterality: Bilateral;  Lumbar four-Sacral One laminectomies   LUNG SURGERY Right 11/22/12   right upper lobe   POLYPECTOMY N/A 12/25/2019   Procedure: POLYPECTOMY;  Surgeon: Lin Landsman, MD;  Location: [place];  Service: Endoscopy;  Laterality: N/A;   PROSTATE SURGERY     biopsy-due to elelvated PSA   RENAL ANGIOGRAPHY Right 07/02/2019   Procedure: RENAL ANGIOGRAPHY;  Surgeon: Algernon Huxley, MD;  Location: [place] [facility];  Service: Cardiovascular;  Laterality: Right;   SHOULDER SURGERY     VASECTOMY      SOCIAL HISTORY: Social History   Socioeconomic History   Marital status: Married    Spouse name: Not on file   Number of children: 4   Years of education: Not on file   Highest education level: Associate degree: occupational, Hotel manager, or vocational program  Occupational History   Occupation: retired  Tobacco Use   Smoking status: Former    Packs/day: 2.00    Years: 45.00    Additional pack years: 0.00    Total pack years: 90.00    Types: Cigarettes    Quit date: 11/28/2012    Years since quitting: 10.2   Smokeless tobacco: Current    Types: Snuff  Vaping Use   Vaping Use: Never used  Substance and  Sexual Activity   Alcohol use: No   Drug use: No   Sexual activity: Yes    Birth control/protection: None  Other Topics Concern   Not on file  Social History Narrative   Lives in with wife; daughter; mother [alzheimer]; quit smoking- 2015 [lung cancer dx]; no alcohol; 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    Social Determinants of Health   Financial Resource Strain: Low Risk  (01/12/2023)   Overall Financial Resource Strain (CARDIA)    Difficulty of Paying Living Expenses: Not hard at all  Food Insecurity: No Food Insecurity (02/25/2022)   Hunger Vital Sign    Worried About Running Out of Food in the Last Year: Never true    Ran Out of Food in the Last Year: Never true  Transportation Needs: No Transportation Needs (01/12/2023)   PRAPARE - Hydrologist (Medical): No  Lack of Transportation (Non-Medical): No  Physical Activity: Inactive (01/12/2023)   Exercise Vital Sign    Days of Exercise per Week: 0 days    Minutes of Exercise per Session: 0 min  Stress: Stress Concern Present (01/12/2023)   [place]    Feeling of Stress : To some extent  Social Connections: Moderately Isolated (01/12/2023)   Social Connection and Isolation Panel [NHANES]    Frequency of Communication with Friends and Family: More than three times a week    Frequency of Social Gatherings with Friends and Family: More than three times a week    Attends Religious Services: Never    Marine scientist or Organizations: No    Attends Archivist Meetings: Never    Marital Status: Married  Human resources officer Violence: Not At Risk (01/12/2023)   Humiliation, Afraid, Rape, and Kick questionnaire    Fear of Current or Ex-Partner: No    Emotionally Abused: No    Physically Abused: No    Sexually Abused: No    FAMILY HISTORY: Family History  Problem Relation Age of Onset   Cancer Father        prostate   Heart disease  Father        MI   Stroke Father    Cancer Brother        liver    ALLERGIES:  is allergic to codeine, hydromorphone, and morphine and related.  MEDICATIONS:  Current Outpatient Medications  Medication Sig Dispense Refill   amLODipine (NORVASC) 10 MG tablet Take 1 tablet (10 mg total) by mouth daily. 90 tablet 3   carvedilol (COREG) 6.25 MG tablet Take 1 tablet (6.25 mg total) by mouth 2 (two) times daily. 180 tablet 1   cilostazol (PLETAL) 50 MG tablet Take 1 tablet (50 mg total) by mouth 2 (two) times daily. 180 tablet 0   Dulaglutide (TRULICITY) 1.5 0000000 SOPN Inject into the skin.     Dulaglutide 1.5 MG/0.5ML SOPN Inject 1.5 mg into the skin once a week.     FARXIGA 10 MG TABS tablet Take 10 mg by mouth every morning.     finasteride (PROSCAR) 5 MG tablet TAKE 1 TABLET (5 MG TOTAL) BY MOUTH DAILY. 90 tablet 3   glucose blood (ONETOUCH VERIO) test strip      hydrALAZINE (APRESOLINE) 100 MG tablet Take 1 tablet (100 mg total) by mouth 3 (three) times daily. 270 tablet 3   olmesartan (BENICAR) 5 MG tablet Take 5 mg by mouth daily.     sertraline (ZOLOFT) 50 MG tablet TAKE 1 TABLET BY MOUTH EVERY DAY 90 tablet 1   tamsulosin (FLOMAX) 0.4 MG CAPS capsule TAKE 1 CAPSULE BY MOUTH EVERY DAY AFTER SUPPER 90 capsule 1   traZODone (DESYREL) 50 MG tablet Take 0.5-1 tablets (25-50 mg total) by mouth at bedtime as needed for sleep. 90 tablet 1   aspirin EC 81 MG tablet Take 1 tablet (81 mg total) by mouth daily. Swallow whole. (Patient not taking: Reported on 12/20/2022) 90 tablet 3   No current facility-administered medications for this visit.     Marland Kitchen  PHYSICAL EXAMINATION: Vitals:   02/07/23 1328  BP: (!) 140/50  Pulse: 63  Temp: 97.6 F (36.4 C)  SpO2: 99%   Filed Weights   02/07/23 1328  Weight: 171 lb 3.2 oz (77.7 kg)   Physical Exam Vitals reviewed.  Constitutional:      Appearance: He is not  ill-appearing.  HENT:     Head: Normocephalic and atraumatic.  Eyes:      Extraocular Movements: Extraocular movements intact.  Pulmonary:     Comments: Decreased breath sounds bilaterally.  Musculoskeletal:        General: No deformity. Normal range of motion.  Skin:    General: Skin is warm and dry.     Coloration: Skin is not pale.     Findings: Bruising present.  Neurological:     Mental Status: He is alert and oriented to person, place, and time.  Psychiatric:        Mood and Affect: Mood normal.        Behavior: Behavior normal.     LABORATORY DATA:  I have reviewed the data as listed Lab Results  Component Value Date   WBC 3.3 (L) 02/07/2023   HGB 9.0 (L) 02/07/2023   HCT 27.8 (L) 02/07/2023   MCV 92.7 02/07/2023   PLT 152 02/07/2023   Recent Labs    08/13/22 1404 10/18/22 1320 01/17/23 1247  NA 138 140 137  K 4.4 4.5 4.4  CL 104 109 109  CO2 20 21* 22  GLUCOSE 243* 107* 83  BUN 49* 57* 58*  CREATININE 2.84* 2.77* 3.43*  CALCIUM 8.3* 8.4* 8.7*  GFRNONAA  --  24* 18*  PROT 6.2  --   --   ALBUMIN 4.0  --   --   AST 9  --   --   ALT 8  --   --   ALKPHOS 85  --   --   BILITOT <0.2  --   --    Iron/TIBC/Ferritin/ %Sat    Component Value Date/Time   IRON 89 01/17/2023 1247   IRON 67 08/13/2022 1404   TIBC 333 01/17/2023 1247   TIBC 297 08/13/2022 1404   FERRITIN 78 01/17/2023 1247   FERRITIN 28 (L) 08/13/2022 1404   IRONPCTSAT 27 01/17/2023 1247   IRONPCTSAT 23 08/13/2022 1404      No results found.  ASSESSMENT & PLAN:   No problem-specific Assessment & Plan notes found for this encounter.  Anemia- Chronic. Octob 2023 hmg 9.7. 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 Recommend re-starting PO iron pills [no GI issues]. DEC 2023- MM - NEG;/L ight chains- 1.88- sec to CKD. March 2024 hmg worse to 8.5. Received venofer 200 mg x 1.   # Today, hemoglobin 9. Defer starting EPO to Dr Rogue Bussing whom patient will see on 02/18/23. Proceed with  Venofer today as planned.   # Hypertension- improved. 140/50 today. Question if weight loss related to diuresis.   # Stage IV Chronic Kidney Disease - related to DM & HTN. Dr. Lanora Manis nephrologist. GFR worse, now 15. K borderline high at 4.9. I've reached out to Dr Lanora Manis to provide update. His office will see patient tomorrow.   # DM- HmgA1c  5.4. On trulicity/GLP1 inhibitors and farxiga/SGLT2 inhibitor which I reviewed may also result in weight loss d/t MOA.   # CHF- chronic. Dr. Charlestine Night cardiologist. Reviewed that medications such as diuretics may also result in weight loss.   # Weight loss- Likely secondary to diuretics, GLP1 inhibitor use and dietary discretion. However, after lengthy discussion, patient and wife prefer to proceed with imaging to evaluate for other etiologies. Had ct chest in 10/21 that showed tree in bud nodules w/ recommendation for short follow up which has not been performed. Colonoscopy and Egd  10/2021; repeat due 10/2024.   # Former smoker- quit 10 years ago. Would qualify for lung cancer screening.   # Elevated PSA- 11.5. Followed by urology.     # DISPOSITION:  # venofer today as planned # Ct c/a/p wo contrast - 1 week # Follow up as scheduled - [place]   All questions were answered. The patient knows to call the clinic with any problems, questions or concerns.  Verlon Au, NP 02/07/2023

## 2023-02-07 NOTE — Telephone Encounter (Signed)
   CCM RN Visit Note   02/07/23 Name: Nicholas Nolan MRN: [id number]      DOB: 07/20/1951  Subjective: Nicholas Nolan is a 72 y.o. year old male who is a primary care patient of [name], [place]. The patient was referred to the Chronic Care Management team for assistance with care management needs subsequent to provider initiation of CCM services and plan of care.      An unsuccessful outreach attempt was made today for a scheduled CCM visit.   PLAN: A HIPAA compliant phone message was left for the patient providing contact information and requesting a return call.   Horris Latino RN Care Manager/Chronic Care Management [phone]

## 2023-02-07 NOTE — Patient Instructions (Signed)

## 2023-02-09 ENCOUNTER — Other Ambulatory Visit: Payer: Medicare PPO | Admitting: Pharmacist

## 2023-02-09 NOTE — Progress Notes (Signed)
02/09/2023 Name: Nicholas Nolan MRN: [id number] DOB: 10-Jan-1951  Chief Complaint  Patient presents with   Medication Access    Nicholas Nolan is a 72 y.o. year old male who presented for a telephone visit.   They were referred to the pharmacist by their PCP for assistance in managing medication access.    Subjective:  Care Team: Primary Care Provider: [name], PA-C  Cardiologist: Kate Sable, MD; Next Scheduled Visit: 08/05/2023 Endocrinologist Solum, Felipa Evener, MD; Next Scheduled Visit: 06/07/2023 Nephrologist: Lyla Son, MD; Next Scheduled Visit: 03/29/2023 Urologist: Abbie Sons, MD; Next Scheduled Visit: 05/20/2023 Oncologist: Verlon Au, NP; Next Scheduled Visit: 02/18/2023  Medication Access/Adherence  Current Pharmacy:  [id number] - [place], [address] [place] [address] Phone: [phone] Fax: [phone]   Patient reports affordability concerns with their medications: No  Patient reports access/transportation concerns to their pharmacy: No  Patient reports adherence concerns with their medications:  No    Per referral from PCP, patient having difficulty with obtaining his Trulicity 1.5 mg Rx since this strength of the medication has been on back order with intermittent availability. - From review of chart, note Endocrinologist sent prescription for patient's Trulicity to [place] on 3/1. Patient confirms received 3 month supply of Trulicity from mail order, currently has >2 month supply remaining  Diabetes:  Note patient follows with [facility]  Current medications: Trulicity 1.5 mg weekly; Farxiga 10 mg daily Medications tried in the past:   Current glucose readings: ranging 70-100s  Patient denies hypoglycemic s/sx including dizziness, shakiness, sweating.     Objective:  Lab Results  Component Value Date   HGBA1C 5.6 09/13/2022    Lab  Results  Component Value Date   CREATININE 4.08 (H) 02/07/2023   BUN 75 (H) 02/07/2023   NA 135 02/07/2023   K 4.9 02/07/2023   CL 110 02/07/2023   CO2 19 (L) 02/07/2023    Lab Results  Component Value Date   CHOL 126 08/21/2020   HDL 48 08/21/2020   LDLCALC 60 08/21/2020   TRIG 89 08/21/2020   CHOLHDL 2.6 08/21/2020    Medications Reviewed Today     Reviewed by Rhoderick Moody, CMA (Certified Medical Assistant) on 02/07/23 at 1325  Med List Status: <None>   Medication Order Taking? Sig Documenting Provider Last Dose Status Informant  amLODipine (NORVASC) 10 MG tablet [id number]  Take 1 tablet (10 mg total) by mouth daily. Kate Sable, MD  Active   aspirin EC 81 MG tablet [id number]  Take 1 tablet (81 mg total) by mouth daily. Swallow whole.  Patient not taking: Reported on 12/20/2022   Kate Sable, MD  Active Self  carvedilol (COREG) 6.25 MG tablet [id number]  Take 1 tablet (6.25 mg total) by mouth 2 (two) times daily. Kate Sable, MD  Active   cilostazol (PLETAL) 50 MG tablet [id number]  Take 1 tablet (50 mg total) by mouth 2 (two) times daily. Kate Sable, MD  Active   Dulaglutide (TRULICITY) 1.5 0000000 SOPN [id number]  Inject into the skin. [provider]  Active   Dulaglutide 1.5 MG/0.5ML SOPN [id number]  Inject 1.5 mg into the skin once a week. [provider]  Active Self           Med Note Tyrone Sage Jan 15, 2021  8:59 AM)    FARXIGA 10 MG TABS tablet [id number]  Take 10  mg by mouth every morning. [provider]  Active Self  finasteride (PROSCAR) 5 MG tablet [id number]  TAKE 1 TABLET (5 MG TOTAL) BY MOUTH DAILY. Debroah Loop, PA-C  Active   glucose blood (ONETOUCH VERIO) test strip [id number]   [provider]  Active Self  hydrALAZINE (APRESOLINE) 100 MG tablet [id number]  Take 1 tablet (100 mg total) by mouth 3 (three) times daily. Kate Sable, MD  Active   olmesartan (BENICAR) 5  MG tablet [id number]  Take 5 mg by mouth daily. [provider]  Active   sertraline (ZOLOFT) 50 MG tablet [id number]  TAKE 1 TABLET BY MOUTH EVERY DAY [name], PA-C  Active   tamsulosin (FLOMAX) 0.4 MG CAPS capsule [id number]  TAKE 1 CAPSULE BY MOUTH EVERY DAY AFTER SUPPER Stoioff, Ronda Fairly, MD  Active   traZODone (DESYREL) 50 MG tablet [id number]  Take 0.5-1 tablets (25-50 mg total) by mouth at bedtime as needed for sleep. [name], PA-C  Active               Assessment/Plan:   Patient declines need to complete medication review with pharmacist at this time  Diabetes: - Currently controlled - Recommend to contact mail order pharmacy a couple of weeks before out of Trulicity 1.5 mg Rx to order refill  - Recommend to check glucose, keep log of results and to contact Endocrinology if needed for readings outside of established parameters    Follow Up Plan:  Patient denies further medication questions or concerns today Provide patient with contact information for clinical pharmacist to contact if needed in future for medication questions/concerns   Wallace Cullens, PharmD, [place] [phone]

## 2023-02-09 NOTE — Patient Instructions (Signed)
It was a pleasure speaking with you today.   Please feel free to give me a call if needed for medication questions/concerns.  Thank you!  Wallace Cullens, PharmD, [facility] [phone]

## 2023-02-15 ENCOUNTER — Ambulatory Visit
Admission: RE | Admit: 2023-02-15 | Discharge: 2023-02-15 | Disposition: A | Discharge status: Home / Self Care | Payer: Medicare PPO | Source: Ambulatory Visit | Attending: Nurse Practitioner | Admitting: Nurse Practitioner

## 2023-02-15 DIAGNOSIS — N200 Calculus of kidney: Secondary | ICD-10-CM | POA: Insufficient documentation

## 2023-02-15 DIAGNOSIS — I10 Essential (primary) hypertension: Secondary | ICD-10-CM | POA: Diagnosis not present

## 2023-02-15 DIAGNOSIS — R809 Proteinuria, unspecified: Secondary | ICD-10-CM | POA: Diagnosis not present

## 2023-02-15 DIAGNOSIS — E875 Hyperkalemia: Secondary | ICD-10-CM | POA: Diagnosis not present

## 2023-02-15 DIAGNOSIS — N400 Enlarged prostate without lower urinary tract symptoms: Secondary | ICD-10-CM | POA: Insufficient documentation

## 2023-02-15 DIAGNOSIS — D631 Anemia in chronic kidney disease: Secondary | ICD-10-CM | POA: Diagnosis not present

## 2023-02-15 DIAGNOSIS — R634 Abnormal weight loss: Secondary | ICD-10-CM | POA: Insufficient documentation

## 2023-02-15 DIAGNOSIS — K828 Other specified diseases of gallbladder: Secondary | ICD-10-CM | POA: Insufficient documentation

## 2023-02-15 DIAGNOSIS — K529 Noninfective gastroenteritis and colitis, unspecified: Secondary | ICD-10-CM | POA: Diagnosis not present

## 2023-02-15 DIAGNOSIS — K861 Other chronic pancreatitis: Secondary | ICD-10-CM | POA: Diagnosis not present

## 2023-02-15 DIAGNOSIS — J9811 Atelectasis: Secondary | ICD-10-CM | POA: Diagnosis not present

## 2023-02-15 DIAGNOSIS — E1122 Type 2 diabetes mellitus with diabetic chronic kidney disease: Secondary | ICD-10-CM | POA: Diagnosis not present

## 2023-02-15 DIAGNOSIS — R911 Solitary pulmonary nodule: Secondary | ICD-10-CM | POA: Insufficient documentation

## 2023-02-15 DIAGNOSIS — E042 Nontoxic multinodular goiter: Secondary | ICD-10-CM | POA: Insufficient documentation

## 2023-02-15 DIAGNOSIS — N2581 Secondary hyperparathyroidism of renal origin: Secondary | ICD-10-CM | POA: Diagnosis not present

## 2023-02-15 DIAGNOSIS — N184 Chronic kidney disease, stage 4 (severe): Secondary | ICD-10-CM | POA: Diagnosis not present

## 2023-02-18 ENCOUNTER — Inpatient Hospital Stay: Payer: Medicare PPO

## 2023-02-18 ENCOUNTER — Inpatient Hospital Stay: Payer: Medicare PPO | Admitting: Internal Medicine

## 2023-02-23 ENCOUNTER — Telehealth: Payer: Self-pay

## 2023-02-23 ENCOUNTER — Telehealth: Payer: Medicare PPO

## 2023-02-23 NOTE — Telephone Encounter (Signed)
   CCM RN Visit Note   02/23/23 Name: Nicholas Nolan MRN: [id number]      DOB: [birth year]  Subjective: Nicholas Nolan is a 72 y.o. year old male who is a primary care patient of Drubel,Lindsay, PA-C. The patient was referred to the Chronic Care Management team for assistance with care management needs subsequent to provider initiation of CCM services and plan of care.      An unsuccessful outreach attempt was made today for a scheduled CCM visit.   PLAN: A HIPAA compliant phone message was left for the patient providing contact information and requesting a return call.   Katha Cabal RN Care Manager/Chronic Care Management [phone]

## 2023-03-03 MED FILL — Iron Sucrose Inj 20 MG/ML (Fe Equiv): INTRAVENOUS | Qty: 10 | Status: AC

## 2023-03-04 ENCOUNTER — Inpatient Hospital Stay: Payer: Medicare PPO

## 2023-03-04 ENCOUNTER — Encounter: Payer: Self-pay | Admitting: Internal Medicine

## 2023-03-04 ENCOUNTER — Inpatient Hospital Stay: Payer: Medicare PPO | Admitting: Internal Medicine

## 2023-03-04 VITALS — BP 125/50 | HR 69 | Temp 97.3°F | Ht 70.0 in | Wt 168.2 lb

## 2023-03-04 DIAGNOSIS — D649 Anemia, unspecified: Secondary | ICD-10-CM | POA: Diagnosis not present

## 2023-03-04 DIAGNOSIS — N184 Chronic kidney disease, stage 4 (severe): Secondary | ICD-10-CM | POA: Diagnosis not present

## 2023-03-04 DIAGNOSIS — D631 Anemia in chronic kidney disease: Secondary | ICD-10-CM | POA: Diagnosis not present

## 2023-03-04 DIAGNOSIS — I130 Hypertensive heart and chronic kidney disease with heart failure and stage 1 through stage 4 chronic kidney disease, or unspecified chronic kidney disease: Secondary | ICD-10-CM | POA: Diagnosis not present

## 2023-03-04 DIAGNOSIS — D509 Iron deficiency anemia, unspecified: Secondary | ICD-10-CM | POA: Diagnosis not present

## 2023-03-04 LAB — CBC WITH DIFFERENTIAL/PLATELET
Abs Immature Granulocytes: 0 10*3/uL (ref 0.00–0.07)
Basophils Absolute: 0 10*3/uL (ref 0.0–0.1)
Basophils Relative: 1 %
Eosinophils Absolute: 0.1 10*3/uL (ref 0.0–0.5)
Eosinophils Relative: 2 %
HCT: 26.3 % — ABNORMAL LOW (ref 39.0–52.0)
Hemoglobin: 8.5 g/dL — ABNORMAL LOW (ref 13.0–17.0)
Immature Granulocytes: 0 %
Lymphocytes Relative: 46 %
Lymphs Abs: 1.4 10*3/uL (ref 0.7–4.0)
MCH: 29.9 pg (ref 26.0–34.0)
MCHC: 32.3 g/dL (ref 30.0–36.0)
MCV: 92.6 fL (ref 80.0–100.0)
Monocytes Absolute: 0.2 10*3/uL (ref 0.1–1.0)
Monocytes Relative: 7 %
Neutro Abs: 1.4 10*3/uL — ABNORMAL LOW (ref 1.7–7.7)
Neutrophils Relative %: 44 %
Platelets: 161 10*3/uL (ref 150–400)
RBC: 2.84 MIL/uL — ABNORMAL LOW (ref 4.22–5.81)
RDW: 12.4 % (ref 11.5–15.5)
WBC: 3.2 10*3/uL — ABNORMAL LOW (ref 4.0–10.5)
nRBC: 0 % (ref 0.0–0.2)

## 2023-03-04 LAB — BASIC METABOLIC PANEL - CANCER CENTER ONLY
Anion gap: 5 (ref 5–15)
BUN: 68 mg/dL — ABNORMAL HIGH (ref 8–23)
CO2: 21 mmol/L — ABNORMAL LOW (ref 22–32)
Calcium: 8.3 mg/dL — ABNORMAL LOW (ref 8.9–10.3)
Chloride: 111 mmol/L (ref 98–111)
Creatinine: 3.17 mg/dL — ABNORMAL HIGH (ref 0.61–1.24)
GFR, Estimated: 20 mL/min — ABNORMAL LOW (ref >60)
Glucose, Bld: 116 mg/dL — ABNORMAL HIGH (ref 70–99)
Potassium: 4.6 mmol/L (ref 3.5–5.1)
Sodium: 137 mmol/L (ref 135–145)

## 2023-03-04 LAB — LACTATE DEHYDROGENASE: LDH: 117 U/L (ref 98–192)

## 2023-03-04 MED ORDER — EPOETIN ALFA-EPBX 10000 UNIT/ML IJ SOLN
20000.0000 [IU] | Freq: Once | INTRAMUSCULAR | Status: AC
  Administered 2023-03-04: 20000 [IU] via SUBCUTANEOUS
  Filled 2023-03-04: qty 2

## 2023-03-04 NOTE — Addendum Note (Signed)
Addended by: Darrold Span A on: 03/04/2023 01:37 PM   Modules accepted: Orders

## 2023-03-04 NOTE — Assessment & Plan Note (Addendum)
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 Also discussed the role of Retacrit boosting the hemoglobin.  Proceed with retacrit today.   # Mild leukopenia white count 3.2 ANC 1.4-monitor for now.  Asymptomatic.  # Unintentional weight loss-MARCH 2024- CT scan chest and pelvis noncontrast-negative for any obvious malignancy.CT scan - Multinodular thyroid- [defer to PCP] for further work up-  #Hypertension-poorly controlled-160 s systolic- better/stable; recommend checking BP  regularly.   # CKD stage IV- [DM/HTN; Dr.Korrapati]-follow-up- GFR 18- stable.   # DM [Hb A1C-5.4]- trulicity/ SGLT2 inhbitors- stable  # CHF- chronic [Dr.Etang]- stable  # 6 mm Left lung nodule; Prior Hx of Lung cancer Right lung [s/p Lobectomy- 2014? [facility]]- follow up CT scna in 6 months- Oct 2024. Marland Kitchen  #Incidental findings on Imaging  CT , 2024:Atheromatous calcifications;  Evidence of chronic pancreatitis; Gallbladder sludge.; Bilateral nephrolithiasis without hydronephrosis; Urinary bladder wall thickening; Enlarged prostate.I reviewed/discussed/counseled the patient.   # DISPOSITION:  # HOLD venofer today; Retacrit # Follow up in 2 weeks- labs- H&H-possible retacrit- # Follow up in 4 weeks- labs- H&H-possible retacrit-  # Follow up in 6 weeks- labs- H&H-possible retacrit-  # Follow up in 8 weeks- MD: labs- cbc;bmp; LDH; possible venofer OR retacrit- Dr.B

## 2023-03-04 NOTE — Progress Notes (Signed)
[facility] CONSULT NOTE  Patient Care Team: Alfredia Ferguson, PA-C as PCP - General (Physician Assistant) Debbe Odea, MD as PCP - Cardiology (Cardiology) Isla Pence, OD (Optometry) Solum, Marlana Salvage, MD as Physician Assistant (Endocrinology) Riki Altes, MD (Urology) Lorain Childes, MD as Consulting Physician (Nephrology) Toney Reil, MD as Consulting Physician (Gastroenterology) Wenda Overland, [place] as Social Worker Delles, Jackelyn Poling, RPH-CPP as Pharmacist  CHIEF COMPLAINTS/PURPOSE OF CONSULTATION: ANEMIA  HEMATOLOGY HISTORY  # Hx of right Upper lobe lung cancer [[facility]]- no chemo/RT.   # ANEMIA[Hb; MCV-platelets- WBC; Iron sat; ferritin;  GFR- IV CT/US; EGD/colonoscopy-[2022- [facility]]  # CKD- IV[Dr.Korrapati]   Latest Reference Range & Units 08/13/22 14:04  Iron 38 - 169 ug/dL 67  UIBC 161 - 096 ug/dL 045  TIBC 409 - 811 ug/dL 914  Ferritin 30 - 782 ng/mL 28 (L)  Iron Saturation 15 - 55 % 23  Vitamin B12 232 - 1,245 pg/mL 629  Globulin, Total 1.5 - 4.5 g/dL 2.2  WBC 3.4 - 95.6 O13Y8/MV 4.3  RBC 4.14 - 5.80 x10E6/uL 3.19 (L)  Hemoglobin 13.0 - 17.7 g/dL 9.7 (L)  HCT 78.4 - 69.6 % 28.8 (L)  MCV 79 - 97 fL 90  MCH 26.6 - 33.0 pg 30.4  MCHC 31.5 - 35.7 g/dL 29.5  RDW 28.4 - 13.2 % 12.5  Platelets 150 - 450 x10E3/uL 200  (L): Data is abnormally low  HISTORY OF PRESENTING ILLNESS: Alone alone.  Ambulating independently.  Nicholas Nolan 72 y.o.  male pleasant patient hypertension, coronary artery disease, congestive heart failure, diabetes, peripheral vascular disease and chronic kidney disease stage 4 with proteinuria is here for a follow up of anemia- CT scan ordered for on going weight loss.  Patient complains of ongoing fatigue.  Denies any nausea vomit.  Denies any worsening shortness of breath or cough.  No blood in stools or black or stools.  Continues to notice improvement of cold intolerance and also sleeping better at  nighttime [restless legs].    Review of Systems  Constitutional:  Positive for malaise/fatigue. Negative for chills, diaphoresis, fever and weight loss.  HENT:  Negative for nosebleeds and sore throat.   Eyes:  Negative for double vision.  Respiratory:  Positive for shortness of breath. Negative for cough, hemoptysis, sputum production and wheezing.   Cardiovascular:  Negative for chest pain, palpitations, orthopnea and leg swelling.  Gastrointestinal:  Negative for abdominal pain, blood in stool, constipation, diarrhea, heartburn, melena, nausea and vomiting.  Genitourinary:  Negative for dysuria, frequency and urgency.  Musculoskeletal:  Negative for back pain and joint pain.  Skin: Negative.  Negative for itching and rash.  Neurological:  Positive for tingling. Negative for dizziness, focal weakness, weakness and headaches.  Endo/Heme/Allergies:  Does not bruise/bleed easily.  Psychiatric/Behavioral:  Negative for depression. The patient is not nervous/anxious and does not have insomnia.      MEDICAL HISTORY:  Past Medical History:  Diagnosis Date   Arthritis    hands   Back pain    leg pain   CAD (coronary artery disease)    a. 10/2019 Cor CTA: LM nl, LAD nl, LCX distal dzs w/ FFR 0.65-0.75. RCA nl-->Med Rx.   CKD (chronic kidney disease), stage III (HCC)    Claudication (HCC)    Diabetes mellitus    type 2   Diastolic dysfunction    a. 08/2019 Echo: EF 60-65%, mild LVH, impaired relaxation. Nl RV size/fxn. Nl LA szie.  History of urinary retention    Hyperlipidemia    Hypertension    a. 05/2019 renal u/s concerning for R RAS; b. 06/2019 Renal artery angio: no more than15% bilat RAS.   Neuropathy    feet   Primary cancer of right upper lobe of lung (HCC) 2014   [facility] Lobectomy   Wears dentures    full upper    SURGICAL HISTORY: Past Surgical History:  Procedure Laterality Date   BASAL CELL CARCINOMA EXCISION     COLONOSCOPY WITH PROPOFOL N/A 12/25/2019   Procedure:  COLONOSCOPY WITH PROPOFOL;  Surgeon: Toney Reil, MD;  Location: [facility];  Service: Endoscopy;  Laterality: N/A;  Diabetic - injectable and oral meds   COLONOSCOPY WITH PROPOFOL N/A 10/08/2021   Procedure: COLONOSCOPY WITH PROPOFOL;  Surgeon: Wyline Mood, MD;  Location: [facility] END[facility];  Service: Gastroenterology;  Laterality: N/A;   LUMBAR LAMINECTOMY/DECOMPRESSION MICRODISCECTOMY  01/10/2012   Procedure: LUMBAR LAMINECTOMY/DECOMPRESSION MICRODISCECTOMY 2 LEVELS;  Surgeon: Hewitt Shorts, MD;  Location: [facility]UR[facility];  Service: Neurosurgery;  Laterality: Bilateral;  Lumbar four-Sacral One laminectomies   LUNG SURGERY Right 11/22/12   right upper lobe   POLYPECTOMY N/A 12/25/2019   Procedure: POLYPECTOMY;  Surgeon: Toney Reil, MD;  Location: [facility];  Service: Endoscopy;  Laterality: N/A;   PROSTATE SURGERY     biopsy-due to elelvated PSA   RENAL ANGIOGRAPHY Right 07/02/2019   Procedure: RENAL ANGIOGRAPHY;  Surgeon: Annice Needy, MD;  Location: [facility] INVASIVE CV LAB;  Service: Cardiovascular;  Laterality: Right;   SHOULDER SURGERY     VASECTOMY      SOCIAL HISTORY: Social History   Socioeconomic History   Marital status: Married    Spouse name: Not on file   Number of children: 4   Years of education: Not on file   Highest education level: Associate degree: occupational, Scientist, product/process development, or vocational program  Occupational History   Occupation: retired  Tobacco Use   Smoking status: Former    Packs/day: 2.00    Years: 45.00    Additional pack years: 0.00    Total pack years: 90.00    Types: Cigarettes    Quit date: 11/28/2012    Years since quitting: 10.2   Smokeless tobacco: Current    Types: Snuff  Vaping Use   Vaping Use: Never used  Substance and Sexual Activity   Alcohol use: No   Drug use: No   Sexual activity: Yes    Birth control/protection: None  Other Topics Concern   Not on file  Social History Narrative   Lives in with wife;  daughter; mother [alzheimer]; quit smoking- 2015 [lung cancer dx]; no alcohol; retd- air Psychiatric nurse.    Social Determinants of Health   Financial Resource Strain: Low Risk  (01/12/2023)   Overall Financial Resource Strain (CARDIA)    Difficulty of Paying Living Expenses: Not hard at all  Food Insecurity: No Food Insecurity (02/25/2022)   Hunger Vital Sign    Worried About Running Out of Food in the Last Year: Never true    Ran Out of Food in the Last Year: Never true  Transportation Needs: No Transportation Needs (01/12/2023)   PRAPARE - Administrator, Civil Service (Medical): No    Lack of Transportation (Non-Medical): No  Physical Activity: Inactive (01/12/2023)   Exercise Vital Sign    Days of Exercise per Week: 0 days    Minutes of Exercise per Session: 0 min  Stress: Stress  Concern Present (01/12/2023)   [employer] of Occupational Health - Occupational Stress Questionnaire    Feeling of Stress : To some extent  Social Connections: Moderately Isolated (01/12/2023)   Social Connection and Isolation Panel [NHANES]    Frequency of Communication with Friends and Family: More than three times a week    Frequency of Social Gatherings with Friends and Family: More than three times a week    Attends Religious Services: Never    Database administrator or Organizations: No    Attends Banker Meetings: Never    Marital Status: Married  Catering manager Violence: Not At Risk (01/12/2023)   Humiliation, Afraid, Rape, and Kick questionnaire    Fear of Current or Ex-Partner: No    Emotionally Abused: No    Physically Abused: No    Sexually Abused: No    FAMILY HISTORY: Family History  Problem Relation Age of Onset   Cancer Father        prostate   Heart disease Father        MI   Stroke Father    Cancer Brother        liver    ALLERGIES:  is allergic to codeine, hydromorphone, and morphine and related.  MEDICATIONS:  Current Outpatient Medications   Medication Sig Dispense Refill   amLODipine (NORVASC) 10 MG tablet Take 1 tablet (10 mg total) by mouth daily. 90 tablet 3   carvedilol (COREG) 6.25 MG tablet Take 1 tablet (6.25 mg total) by mouth 2 (two) times daily. 180 tablet 1   cilostazol (PLETAL) 50 MG tablet Take 1 tablet (50 mg total) by mouth 2 (two) times daily. 180 tablet 0   Dulaglutide (TRULICITY) 1.5 MG/0.5ML SOPN Inject into the skin.     Dulaglutide 1.5 MG/0.5ML SOPN Inject 1.5 mg into the skin once a week.     FARXIGA 10 MG TABS tablet Take 10 mg by mouth every morning.     finasteride (PROSCAR) 5 MG tablet TAKE 1 TABLET (5 MG TOTAL) BY MOUTH DAILY. 90 tablet 3   glucose blood (ONETOUCH VERIO) test strip      hydrALAZINE (APRESOLINE) 100 MG tablet Take 1 tablet (100 mg total) by mouth 3 (three) times daily. 270 tablet 3   olmesartan (BENICAR) 5 MG tablet Take 5 mg by mouth daily.     sertraline (ZOLOFT) 50 MG tablet TAKE 1 TABLET BY MOUTH EVERY DAY 90 tablet 1   tamsulosin (FLOMAX) 0.4 MG CAPS capsule TAKE 1 CAPSULE BY MOUTH EVERY DAY AFTER SUPPER 90 capsule 1   traZODone (DESYREL) 50 MG tablet Take 0.5-1 tablets (25-50 mg total) by mouth at bedtime as needed for sleep. 90 tablet 1   No current facility-administered medications for this visit.   Facility-Administered Medications Ordered in Other Visits  Medication Dose Route Frequency Provider Last Rate Last Admin   epoetin alfa-epbx (RETACRIT) injection 20,000 Units  20,000 Units Subcutaneous Once Louretta Shorten R, MD         .  PHYSICAL EXAMINATION:   Vitals:   03/04/23 1239  BP: (!) 125/50  Pulse: 69  Temp: (!) 97.3 F (36.3 C)  SpO2: 100%    Filed Weights   03/04/23 1239  Weight: 168 lb 3.2 oz (76.3 kg)     Physical Exam Vitals and nursing note reviewed.  HENT:     Head: Normocephalic and atraumatic.     Mouth/Throat:     Pharynx: Oropharynx is clear.  Eyes:  Extraocular Movements: Extraocular movements intact.     Pupils: Pupils are  equal, round, and reactive to light.  Cardiovascular:     Rate and Rhythm: Normal rate and regular rhythm.  Pulmonary:     Comments: Decreased breath sounds bilaterally.  Abdominal:     Palpations: Abdomen is soft.  Musculoskeletal:        General: Normal range of motion.     Cervical back: Normal range of motion.  Skin:    General: Skin is warm.  Neurological:     General: No focal deficit present.     Mental Status: He is alert and oriented to person, place, and time.  Psychiatric:        Behavior: Behavior normal.        Judgment: Judgment normal.      LABORATORY DATA:  I have reviewed the data as listed Lab Results  Component Value Date   WBC 3.2 (L) 03/04/2023   HGB 8.5 (L) 03/04/2023   HCT 26.3 (L) 03/04/2023   MCV 92.6 03/04/2023   PLT 161 03/04/2023   Recent Labs    08/13/22 1404 10/18/22 1320 01/17/23 1247 02/07/23 1311 03/04/23 1231  NA 138   < > 137 135 137  K 4.4   < > 4.4 4.9 4.6  CL 104   < > 109 110 111  CO2 20   < > 22 19* 21*  GLUCOSE 243*   < > 83 107* 116*  BUN 49*   < > 58* 75* 68*  CREATININE 2.84*   < > 3.43* 4.08* 3.17*  CALCIUM 8.3*   < > 8.7* 8.4* 8.3*  GFRNONAA  --    < > 18* 15* 20*  PROT 6.2  --   --  6.4*  --   ALBUMIN 4.0  --   --  3.7  --   AST 9  --   --  11*  --   ALT 8  --   --  8  --   ALKPHOS 85  --   --  51  --   BILITOT <0.2  --   --  0.3  --    < > = values in this interval not displayed.     CT CHEST ABDOMEN PELVIS WO CONTRAST  Result Date: 02/17/2023 CLINICAL DATA:  Weight loss EXAM: CT CHEST, ABDOMEN AND PELVIS WITHOUT CONTRAST TECHNIQUE: Multidetector CT imaging of the chest, abdomen and pelvis was performed following the standard protocol without IV contrast. RADIATION DOSE REDUCTION: This exam was performed according to the departmental dose-optimization program which includes automated exposure control, adjustment of the mA and/or kV according to patient size and/or use of iterative reconstruction technique.  COMPARISON:  10/11/2019 FINDINGS: CT CHEST FINDINGS Cardiovascular: No cardiomegaly there is dense atheromatous calcification of the aorta and coronary arteries. No pericardial effusion. Mediastinum/Nodes: Thyroid demonstrates diffuse nodularity which can be correlated with ultrasound. No esophageal or tracheal bronchial abnormalities. No suspicious adenopathy. Lungs/Pleura: Minimal bibasilar dependent subsegmental atelectasis or scarring. Pleural-based 6 mm nodule left lower lobe. If the patient is deemed to be at high risk consider follow up CT in 12 months. No pleural or pericardial effusion. Musculoskeletal: No chest wall mass or suspicious bone lesions identified. CT ABDOMEN PELVIS FINDINGS Hepatobiliary: No hepatic parenchymal abnormalities identified. There is gallbladder sludge. No pericholecystic inflammatory changes. Pancreas: Pancreatic calcifications are seen consistent with chronic pancreatitis. No acute peripancreatic inflammatory changes or lesions identified without contrast. Spleen: Normal in size without focal abnormality. Adrenals/Urinary  Tract: Punctate bilateral intrarenal stones identified. No renal parenchymal lesions. No adrenal lesions. Nonspecific bladder wall thickening consistent with hypertrophy or inflammation. Stomach/Bowel: Stomach is within normal limits. Appendix appears normal. No evidence of bowel wall thickening, distention, or inflammatory changes. Vascular/Lymphatic: No significant vascular findings are present. No enlarged abdominal or pelvic lymph nodes. Reproductive: Grossly enlarged prostate. Other: No abdominal wall hernia or abnormality. No abdominopelvic ascites. Musculoskeletal: No acute or significant osseous findings. IMPRESSION: 1. Atheromatous calcifications. 2. Left lung nodule that does not need further follow-up unless patient is high risk. 3. Multinodular thyroid. Outpatient ultrasound correlation recommended. 4. Evidence of chronic pancreatitis. 5. Gallbladder  sludge. 6. Bilateral nephrolithiasis without hydronephrosis. 7. Urinary bladder wall thickening which is consistent with hypertrophy or inflammation. 8. Enlarged prostate. Electronically Signed   By: Layla Maw M.D.   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 Also discussed the role of Retacrit boosting the hemoglobin.  Proceed with retacrit today.   # Mild leukopenia white count 3.2 ANC 1.4-monitor for now.  Asymptomatic.  # Unintentional weight loss-MARCH 2024- CT scan chest and pelvis noncontrast-negative for any obvious malignancy.CT scan - Multinodular thyroid- [defer to PCP] for further work up-  #Hypertension-poorly controlled-160 s systolic- better/stable; recommend checking BP  regularly.   # CKD stage IV- [DM/HTN; Dr.Korrapati]-follow-up- GFR 18- stable.   # DM [Hb A1C-5.4]- trulicity/ SGLT2 inhbitors- stable  # CHF- chronic [Dr.Etang]- stable  # 6 mm Left lung nodule; Prior Hx of Lung cancer Right lung [s/p Lobectomy- 2014? [facility]]- follow up CT scna in 6 months- Oct 2024. Marland Kitchen  #Incidental findings on Imaging  CT , 2024:Atheromatous calcifications;  Evidence of chronic pancreatitis; Gallbladder sludge.; Bilateral nephrolithiasis without hydronephrosis; Urinary bladder wall thickening; Enlarged prostate.I reviewed/discussed/counseled the patient.   # DISPOSITION:  # HOLD venofer today; Retacrit # Follow up in 2 weeks- labs- H&H-possible retacrit- # Follow up in 4 weeks- labs- H&H-possible retacrit-  # Follow up in 6 weeks- labs- H&H-possible retacrit-  # Follow up in 8 weeks- MD: labs- cbc;bmp; LDH; possible venofer OR retacrit-  Dr.B     All questions were answered. The patient knows to call the clinic with any problems, questions or concerns.    Earna Coder, MD 03/04/2023 1:14 PM

## 2023-03-04 NOTE — Progress Notes (Signed)
Fatigue/weakness: yes Dyspena: no  Light headedness: at times Blood in stool: no

## 2023-03-17 DIAGNOSIS — N184 Chronic kidney disease, stage 4 (severe): Secondary | ICD-10-CM | POA: Diagnosis not present

## 2023-03-18 ENCOUNTER — Inpatient Hospital Stay: Payer: Medicare PPO | Attending: Internal Medicine

## 2023-03-18 ENCOUNTER — Inpatient Hospital Stay: Payer: Medicare PPO

## 2023-03-18 DIAGNOSIS — D631 Anemia in chronic kidney disease: Secondary | ICD-10-CM | POA: Insufficient documentation

## 2023-03-18 DIAGNOSIS — N184 Chronic kidney disease, stage 4 (severe): Secondary | ICD-10-CM | POA: Insufficient documentation

## 2023-03-29 DIAGNOSIS — E875 Hyperkalemia: Secondary | ICD-10-CM | POA: Diagnosis not present

## 2023-03-29 DIAGNOSIS — N1832 Chronic kidney disease, stage 3b: Secondary | ICD-10-CM | POA: Diagnosis not present

## 2023-03-29 DIAGNOSIS — E1122 Type 2 diabetes mellitus with diabetic chronic kidney disease: Secondary | ICD-10-CM | POA: Diagnosis not present

## 2023-03-29 DIAGNOSIS — R809 Proteinuria, unspecified: Secondary | ICD-10-CM | POA: Diagnosis not present

## 2023-03-29 DIAGNOSIS — N400 Enlarged prostate without lower urinary tract symptoms: Secondary | ICD-10-CM | POA: Diagnosis not present

## 2023-03-29 DIAGNOSIS — D631 Anemia in chronic kidney disease: Secondary | ICD-10-CM | POA: Diagnosis not present

## 2023-03-29 DIAGNOSIS — N2581 Secondary hyperparathyroidism of renal origin: Secondary | ICD-10-CM | POA: Diagnosis not present

## 2023-03-29 DIAGNOSIS — N184 Chronic kidney disease, stage 4 (severe): Secondary | ICD-10-CM | POA: Diagnosis not present

## 2023-03-29 DIAGNOSIS — K529 Noninfective gastroenteritis and colitis, unspecified: Secondary | ICD-10-CM | POA: Diagnosis not present

## 2023-04-01 ENCOUNTER — Inpatient Hospital Stay: Payer: Medicare PPO

## 2023-04-01 VITALS — BP 129/41 | HR 51

## 2023-04-01 DIAGNOSIS — D631 Anemia in chronic kidney disease: Secondary | ICD-10-CM | POA: Diagnosis not present

## 2023-04-01 DIAGNOSIS — D649 Anemia, unspecified: Secondary | ICD-10-CM

## 2023-04-01 DIAGNOSIS — N184 Chronic kidney disease, stage 4 (severe): Secondary | ICD-10-CM | POA: Diagnosis not present

## 2023-04-01 LAB — HEMOGLOBIN AND HEMATOCRIT, BLOOD
HCT: 27.4 % — ABNORMAL LOW (ref 39.0–52.0)
Hemoglobin: 8.6 g/dL — ABNORMAL LOW (ref 13.0–17.0)

## 2023-04-01 MED ORDER — EPOETIN ALFA-EPBX 20000 UNIT/ML IJ SOLN
20000.0000 [IU] | Freq: Once | INTRAMUSCULAR | Status: AC
  Administered 2023-04-01: 20000 [IU] via SUBCUTANEOUS
  Filled 2023-04-01: qty 1

## 2023-04-15 ENCOUNTER — Ambulatory Visit: Payer: Medicare PPO

## 2023-04-15 ENCOUNTER — Other Ambulatory Visit: Payer: Medicare PPO

## 2023-04-18 ENCOUNTER — Inpatient Hospital Stay: Payer: Medicare PPO

## 2023-04-18 ENCOUNTER — Inpatient Hospital Stay: Payer: Medicare PPO | Attending: Internal Medicine

## 2023-04-18 DIAGNOSIS — N184 Chronic kidney disease, stage 4 (severe): Secondary | ICD-10-CM | POA: Insufficient documentation

## 2023-04-18 DIAGNOSIS — D631 Anemia in chronic kidney disease: Secondary | ICD-10-CM | POA: Insufficient documentation

## 2023-04-20 ENCOUNTER — Telehealth: Payer: Self-pay | Admitting: Cardiology

## 2023-04-20 DIAGNOSIS — E1159 Type 2 diabetes mellitus with other circulatory complications: Secondary | ICD-10-CM

## 2023-04-20 NOTE — Telephone Encounter (Signed)
Called and spoke with wife Angelique Blonder per [place]. Per wife the patient has been feeling dizzy and his  nephrologist suggested decreasing the hydralazine to 50 mg in the mornings. The patient's wife is requesting a prescription for 50 mg Hydralazine. Informed the wife that the nephrologist would have to send the the prescription in since they are changing the dose. The wife stated that the nephrologist only suggested to change the dose and would like [place] to send in the prescription. Wife informed that it would have to be verified with Dr. Azucena Cecil. Wife verbalized understanding.

## 2023-04-20 NOTE — Telephone Encounter (Signed)
Pt c/o medication issue:  1. Name of Medication: hydrALAZINE (APRESOLINE) 100 MG tablet   2. How are you currently taking this medication (dosage and times per day)?  50mg  in the am, 100mg  in the pm   3. Are you having a reaction (difficulty breathing--STAT)? no  4. What is your medication issue? Pt spouse called in stating pt's Nephrologist wants to cut it down to 50 in morning and 100 at night. She states they have tried to cut it in half but the pills aren't able to be cut. They want to know if this rx can be changed to 50mg  tablets instead of 100 mg.

## 2023-04-21 NOTE — Telephone Encounter (Signed)
Called patient to review Dr.Agbor-Etang recommendations. No answer. LMOV to call back.   

## 2023-04-25 ENCOUNTER — Ambulatory Visit: Payer: Self-pay

## 2023-04-25 NOTE — Telephone Encounter (Signed)
Called patient. No answer. No voicemail. 

## 2023-04-25 NOTE — Telephone Encounter (Signed)
Chief Complaint: Fever Symptoms: vomiting, malaise, chills Frequency: Onset several weeks off and on Pertinent Negatives: Patient denies fever today, no other symptoms Disposition: [] ED /[] Urgent Care (no appt availability in office) / [x] Appointment(In office/virtual)/ []  [facility]/ [] Home Care/ [] Refused Recommended Disposition /[] [facility]/ []  Follow-up with PCP Additional Notes: Patient's wife Cordelia Pen called and says he's been having a fever and vomiting off and on for several weeks, no fever/vomiting today, no thermometer to check temperature says she goes by how warm he feels. She gives Tylenol 1-2 times a day when he's warm. Advised OV, scheduled for tomorrow.   Reason for Disposition  Fever present > 3 days (72 hours)  Answer Assessment - Initial Assessment Questions 1. TEMPERATURE: "What is the most recent temperature?"  "How was it measured?"      Not measured 2. ONSET: "When did the fever start?"      Ongoing for several weeks 3. CHILLS: "Do you have chills?" If yes: "How bad are they?"  (e.g., none, mild, moderate, severe)   - NONE: no chills   - MILD: feeling cold   - MODERATE: feeling very cold, some shivering (feels better under a thick blanket)   - SEVERE: feeling extremely cold with shaking chills (general body shaking, rigors; even under a thick blanket)      No chills at present 4. OTHER SYMPTOMS: "Do you have any other symptoms besides the fever?"  (e.g., abdomen pain, cough, diarrhea, earache, headache, sore throat, urination pain)     Vomiting, chills, malaise (no energy) 5. CAUSE: If there are no symptoms, ask: "What do you think is causing the fever?"      I don't know 6. CONTACTS: "Does anyone else in the family have an infection?"     No 7. TREATMENT: "What have you done so far to treat this fever?" (e.g., medications)     Tylenol once or twice a day 8. IMMUNOCOMPROMISE: "Do you have of the following: diabetes, HIV positive,  splenectomy, cancer chemotherapy, chronic steroid treatment, transplant patient, etc."     Stage 4 kidney disease  Protocols used: Fever-A-AH

## 2023-04-26 ENCOUNTER — Telehealth: Payer: Self-pay | Admitting: Physician Assistant

## 2023-04-26 ENCOUNTER — Ambulatory Visit: Payer: Medicare PPO | Admitting: Physician Assistant

## 2023-04-26 ENCOUNTER — Encounter: Payer: Self-pay | Admitting: Physician Assistant

## 2023-04-26 VITALS — BP 132/48 | HR 71 | Temp 98.1°F | Resp 13 | Wt 161.9 lb

## 2023-04-26 DIAGNOSIS — N184 Chronic kidney disease, stage 4 (severe): Secondary | ICD-10-CM | POA: Diagnosis not present

## 2023-04-26 DIAGNOSIS — R634 Abnormal weight loss: Secondary | ICD-10-CM | POA: Diagnosis not present

## 2023-04-26 DIAGNOSIS — R509 Fever, unspecified: Secondary | ICD-10-CM | POA: Diagnosis not present

## 2023-04-26 DIAGNOSIS — J0110 Acute frontal sinusitis, unspecified: Secondary | ICD-10-CM | POA: Diagnosis not present

## 2023-04-26 DIAGNOSIS — K861 Other chronic pancreatitis: Secondary | ICD-10-CM

## 2023-04-26 DIAGNOSIS — I152 Hypertension secondary to endocrine disorders: Secondary | ICD-10-CM

## 2023-04-26 DIAGNOSIS — N401 Enlarged prostate with lower urinary tract symptoms: Secondary | ICD-10-CM | POA: Diagnosis not present

## 2023-04-26 DIAGNOSIS — E1159 Type 2 diabetes mellitus with other circulatory complications: Secondary | ICD-10-CM

## 2023-04-26 DIAGNOSIS — E1122 Type 2 diabetes mellitus with diabetic chronic kidney disease: Secondary | ICD-10-CM | POA: Diagnosis not present

## 2023-04-26 DIAGNOSIS — R112 Nausea with vomiting, unspecified: Secondary | ICD-10-CM

## 2023-04-26 LAB — POCT URINALYSIS DIPSTICK
Bilirubin, UA: NEGATIVE
Blood, UA: NEGATIVE
Glucose, UA: POSITIVE — AB
Ketones, UA: NEGATIVE
Leukocytes, UA: NEGATIVE
Nitrite, UA: NEGATIVE
Protein, UA: POSITIVE — AB
Spec Grav, UA: 1.015 (ref 1.010–1.025)
Urobilinogen, UA: 0.2 E.U./dL
pH, UA: 6 (ref 5.0–8.0)

## 2023-04-26 MED ORDER — HYDRALAZINE HCL 50 MG PO TABS
50.0000 mg | ORAL_TABLET | Freq: Three times a day (TID) | ORAL | 1 refills | Status: AC
Start: 2023-04-26 — End: ?

## 2023-04-26 MED ORDER — ONDANSETRON 4 MG PO TBDP
4.0000 mg | ORAL_TABLET | Freq: Three times a day (TID) | ORAL | 0 refills | Status: DC | PRN
Start: 2023-04-26 — End: 2023-05-20

## 2023-04-26 MED ORDER — HYDRALAZINE HCL 50 MG PO TABS
ORAL_TABLET | ORAL | 1 refills | Status: DC
Start: 2023-04-26 — End: 2023-04-26

## 2023-04-26 MED ORDER — AZITHROMYCIN 250 MG PO TABS
ORAL_TABLET | ORAL | 0 refills | Status: AC
Start: 2023-04-26 — End: 2023-05-01

## 2023-04-26 NOTE — Progress Notes (Signed)
I,Vanessa  Vital,acting as a Neurosurgeon for [employer], PA-C.,have documented all relevant documentation on the behalf of Alfredia Ferguson, PA-C,as directed by  Alfredia Ferguson, PA-C while in the presence of Alfredia Ferguson, PA-C.   Established patient visit   Patient: Nicholas Nolan   DOB: 1951/01/07   72 y.o. Male  MRN: [id number] Visit Date: 04/26/2023  Today's healthcare provider: Alfredia Ferguson, PA-C   Cc. Weight loss, n/v, fevers  Subjective    HPI  Discussed the use of AI scribe software for clinical note transcription with the patient, who gave verbal consent to proceed.  History of Present Illness   The patient, with a history of chronic kidney disease (CKD), presents with intermittent fevers occurring mostly at night for around 2-3 weeks. He also reports episodes of vomiting every few days, which typically contains recently ingested food, denies hematemesis, coffee ground emesis. This has been occurring for several months intermittently. The patient denies any associated abdominal pain, diarrhea.  In addition to the above, the patient has been experiencing congestion in the chest and head for approximately two and a half weeks. He also reports a cough and a sore throat. The patient mentions that others in their close contact have also been ill recently, but all recovered.  The patient also reports a significant weight loss over the past few months. He attributes this weight loss to dietary changes, he has not been eating has much.       Medications: Outpatient Medications Prior to Visit  Medication Sig   carvedilol (COREG) 6.25 MG tablet Take 1 tablet (6.25 mg total) by mouth 2 (two) times daily.   Dulaglutide (TRULICITY) 1.5 MG/0.5ML SOPN Inject into the skin.   Dulaglutide 1.5 MG/0.5ML SOPN Inject 1.5 mg into the skin once a week.   FARXIGA 10 MG TABS tablet Take 10 mg by mouth every morning.   glucose blood (ONETOUCH VERIO) test strip    olmesartan (BENICAR) 5 MG  tablet Take 5 mg by mouth daily.   sertraline (ZOLOFT) 50 MG tablet TAKE 1 TABLET BY MOUTH EVERY DAY   tamsulosin (FLOMAX) 0.4 MG CAPS capsule TAKE 1 CAPSULE BY MOUTH EVERY DAY AFTER SUPPER   traZODone (DESYREL) 50 MG tablet Take 0.5-1 tablets (25-50 mg total) by mouth at bedtime as needed for sleep.   [DISCONTINUED] amLODipine (NORVASC) 10 MG tablet Take 1 tablet (10 mg total) by mouth daily.   [DISCONTINUED] hydrALAZINE (APRESOLINE) 100 MG tablet Take 1 tablet (100 mg total) by mouth 3 (three) times daily.   cilostazol (PLETAL) 50 MG tablet Take 1 tablet (50 mg total) by mouth 2 (two) times daily. (Patient not taking: Reported on 04/26/2023)   finasteride (PROSCAR) 5 MG tablet TAKE 1 TABLET (5 MG TOTAL) BY MOUTH DAILY. (Patient not taking: Reported on 04/26/2023)   No facility-administered medications prior to visit.    Review of Systems  Constitutional:  Positive for chills and fever.  HENT:  Positive for congestion and sore throat.   Respiratory:  Positive for cough.   Gastrointestinal:  Positive for nausea and vomiting.  Neurological:  Positive for headaches.  All other systems reviewed and are negative.    Objective    BP (!) 132/48 (BP Location: Left Arm, Patient Position: Sitting, Cuff Size: Normal)   Pulse 71   Temp 98.1 F (36.7 C) (Oral)   Resp 13   Wt 161 lb 14.4 oz (73.4 kg)   SpO2 98%   BMI 23.23 kg/m   Physical  Exam Constitutional:      General: He is awake.     Appearance: He is well-developed. He is ill-appearing.     Comments: Visible weight loss in face  HENT:     Head: Normocephalic.     Right Ear: Tympanic membrane normal.     Left Ear: Tympanic membrane normal.  Eyes:     Conjunctiva/sclera: Conjunctivae normal.  Cardiovascular:     Rate and Rhythm: Normal rate and regular rhythm.     Heart sounds: Normal heart sounds.  Pulmonary:     Effort: Pulmonary effort is normal.     Breath sounds: Normal breath sounds.  Skin:    General: Skin is warm.   Neurological:     Mental Status: He is alert and oriented to person, place, and time.  Psychiatric:        Attention and Perception: Attention normal.        Mood and Affect: Mood normal.        Speech: Speech normal.        Behavior: Behavior is cooperative.     Results for orders placed or performed in visit on 04/26/23  POCT Urinalysis Dipstick  Result Value Ref Range   Color, UA Yellow    Clarity, UA Clear    Glucose, UA Positive (A) Negative   Bilirubin, UA Negative    Ketones, UA Negative    Spec Grav, UA 1.015 1.010 - 1.025   Blood, UA Negative    pH, UA 6.0 5.0 - 8.0   Protein, UA Positive (A) Negative   Urobilinogen, UA 0.2 0.2 or 1.0 E.U./dL   Nitrite, UA Negative    Leukocytes, UA Negative Negative   Appearance Clear    Odor      Assessment & Plan     1. Fever, unspecified fever cause 2. Acute non-recurrent frontal sinusitis Long standing viral infection vs sinusitis  Tx w/ azithro, encouraged pushing fluids.  UA negative,  - POCT Urinalysis Dipstick - Comprehensive Metabolic Panel (CMET) - CBC w/Diff/Platelet - Lipase - azithromycin (ZITHROMAX) 250 MG tablet; Take 2 tablets on day 1, then 1 tablet daily on days 2 through 5  Dispense: 6 tablet; Refill: 0  3. Nausea and vomiting, unspecified vomiting type Rx zofran. Ref to GI for n/v/weight loss. Check labs Encourage fluids  - Comprehensive Metabolic Panel (CMET) - CBC w/Diff/Platelet - Lipase - ondansetron (ZOFRAN-ODT) 4 MG disintegrating tablet; Take 1 tablet (4 mg total) by mouth every 8 (eight) hours as needed for nausea or vomiting.  Dispense: 20 tablet; Refill: 0  4. Benign prostatic hyperplasia with lower urinary tract symptoms, symptom details unspecified Pt has f/u with uro scheduled. Repeat psa-- history of bph and now sudden weight loss - PSA  5. Type 2 diabetes mellitus with stage 4 chronic kidney disease, without long-term current use of insulin (HCC) Repeat a1c - HgB A1c  6.  Hypertension associated with diabetes (HCC) Pt was advised to cut down his hydralazine but as he cannot cut in half he has not been able to. Could explain hypotension along w/ some dehydration.  Rx 50 mg tablets--advised 50 mg in Am, 50 mg midday, 100 mg in PM. If still dizzy/hypotensive like today -- 50 mg TID.   7. Chronic pancreatitis, unspecified pancreatitis type (HCC) Seen on CT scan 02/15/23, could explain n/v  - Ambulatory referral to Gastroenterology  8. Weight loss Unclear what this is 2/to I think it is too dramatic to be dietary.  Weight 01/17/23  182 lbs Weight today 161 lbs - Ambulatory referral to Gastroenterology   Return if symptoms worsen or fail to improve.      I, Alfredia Ferguson, PA-C have reviewed all documentation for this visit. The documentation on  04/26/23   for the exam, diagnosis, procedures, and orders are all accurate and complete.  Alfredia Ferguson, PA-C [facility] [address] [place], [place], [address] Office: [phone] Fax: [phone]   [facility]

## 2023-04-26 NOTE — Telephone Encounter (Signed)
Called patient.  Reviewed Dr. Azucena Cecil recommendations as followed:  Okay to prescribe hydralazine 50 mg tablets, dosage 3 times daily instead of 100.  Thank you

## 2023-04-26 NOTE — Addendum Note (Signed)
Addended by: Margrett Rud on: 04/26/2023 02:45 PM   Modules accepted: Orders

## 2023-04-27 ENCOUNTER — Telehealth: Payer: Self-pay | Admitting: Gastroenterology

## 2023-04-27 LAB — COMPREHENSIVE METABOLIC PANEL
ALT: 4 IU/L (ref 0–44)
AST: 10 IU/L (ref 0–40)
Albumin: 3.5 g/dL — ABNORMAL LOW (ref 3.8–4.8)
Alkaline Phosphatase: 67 IU/L (ref 44–121)
BUN/Creatinine Ratio: 17 (ref 10–24)
BUN: 56 mg/dL — ABNORMAL HIGH (ref 8–27)
Bilirubin Total: <0.2 mg/dL (ref 0.0–1.2)
CO2: 22 mmol/L (ref 20–29)
Calcium: 8.8 mg/dL (ref 8.6–10.2)
Chloride: 105 mmol/L (ref 96–106)
Creatinine, Ser: 3.39 mg/dL — ABNORMAL HIGH (ref 0.76–1.27)
Globulin, Total: 2.1 g/dL (ref 1.5–4.5)
Glucose: 144 mg/dL — ABNORMAL HIGH (ref 70–99)
Potassium: 4.6 mmol/L (ref 3.5–5.2)
Sodium: 140 mmol/L (ref 134–144)
Total Protein: 5.6 g/dL — ABNORMAL LOW (ref 6.0–8.5)
eGFR: 18 mL/min/{1.73_m2} — ABNORMAL LOW (ref >59)

## 2023-04-27 LAB — CBC WITH DIFFERENTIAL/PLATELET
Basophils Absolute: 0 10*3/uL (ref 0.0–0.2)
Basos: 1 %
EOS (ABSOLUTE): 0.1 10*3/uL (ref 0.0–0.4)
Eos: 1 %
Hematocrit: 24.3 % — ABNORMAL LOW (ref 37.5–51.0)
Hemoglobin: 8.2 g/dL — ABNORMAL LOW (ref 13.0–17.7)
Immature Grans (Abs): 0 10*3/uL (ref 0.0–0.1)
Immature Granulocytes: 0 %
Lymphocytes Absolute: 1.2 10*3/uL (ref 0.7–3.1)
Lymphs: 22 %
MCH: 30 pg (ref 26.6–33.0)
MCHC: 33.7 g/dL (ref 31.5–35.7)
MCV: 89 fL (ref 79–97)
Monocytes Absolute: 0.3 10*3/uL (ref 0.1–0.9)
Monocytes: 6 %
Neutrophils Absolute: 3.8 10*3/uL (ref 1.4–7.0)
Neutrophils: 70 %
Platelets: 217 10*3/uL (ref 150–450)
RBC: 2.73 x10E6/uL — CL (ref 4.14–5.80)
RDW: 12.4 % (ref 11.6–15.4)
WBC: 5.4 10*3/uL (ref 3.4–10.8)

## 2023-04-27 LAB — HEMOGLOBIN A1C
Est. average glucose Bld gHb Est-mCnc: 105 mg/dL
Hgb A1c MFr Bld: 5.3 % (ref 4.8–5.6)

## 2023-04-27 LAB — PSA: Prostate Specific Ag, Serum: 13.1 ng/mL — ABNORMAL HIGH (ref 0.0–4.0)

## 2023-04-27 LAB — LIPASE: Lipase: 55 U/L (ref 13–78)

## 2023-04-27 NOTE — Telephone Encounter (Signed)
Called the pt to schedule an urgent appt. No answer mailbox was full unable to leave a voicemail.

## 2023-04-28 MED FILL — Iron Sucrose Inj 20 MG/ML (Fe Equiv): INTRAVENOUS | Qty: 10 | Status: AC

## 2023-04-29 ENCOUNTER — Inpatient Hospital Stay (HOSPITAL_BASED_OUTPATIENT_CLINIC_OR_DEPARTMENT_OTHER): Payer: Medicare PPO | Admitting: Nurse Practitioner

## 2023-04-29 ENCOUNTER — Inpatient Hospital Stay: Payer: Medicare PPO

## 2023-04-29 ENCOUNTER — Encounter: Payer: Self-pay | Admitting: Nurse Practitioner

## 2023-04-29 VITALS — BP 133/47 | HR 60 | Temp 97.4°F | Wt 161.7 lb

## 2023-04-29 DIAGNOSIS — D631 Anemia in chronic kidney disease: Secondary | ICD-10-CM | POA: Diagnosis not present

## 2023-04-29 DIAGNOSIS — D649 Anemia, unspecified: Secondary | ICD-10-CM | POA: Diagnosis not present

## 2023-04-29 DIAGNOSIS — N184 Chronic kidney disease, stage 4 (severe): Secondary | ICD-10-CM | POA: Diagnosis not present

## 2023-04-29 LAB — BASIC METABOLIC PANEL - CANCER CENTER ONLY
Anion gap: 10 (ref 5–15)
BUN: 50 mg/dL — ABNORMAL HIGH (ref 8–23)
CO2: 22 mmol/L (ref 22–32)
Calcium: 8.7 mg/dL — ABNORMAL LOW (ref 8.9–10.3)
Chloride: 107 mmol/L (ref 98–111)
Creatinine: 3.25 mg/dL — ABNORMAL HIGH (ref 0.61–1.24)
GFR, Estimated: 19 mL/min — ABNORMAL LOW (ref >60)
Glucose, Bld: 123 mg/dL — ABNORMAL HIGH (ref 70–99)
Potassium: 5 mmol/L (ref 3.5–5.1)
Sodium: 139 mmol/L (ref 135–145)

## 2023-04-29 LAB — CBC WITH DIFFERENTIAL (CANCER CENTER ONLY)
Abs Immature Granulocytes: 0.01 10*3/uL (ref 0.00–0.07)
Basophils Absolute: 0.1 10*3/uL (ref 0.0–0.1)
Basophils Relative: 1 %
Eosinophils Absolute: 0.1 10*3/uL (ref 0.0–0.5)
Eosinophils Relative: 1 %
HCT: 26.2 % — ABNORMAL LOW (ref 39.0–52.0)
Hemoglobin: 8.4 g/dL — ABNORMAL LOW (ref 13.0–17.0)
Immature Granulocytes: 0 %
Lymphocytes Relative: 29 %
Lymphs Abs: 1.4 10*3/uL (ref 0.7–4.0)
MCH: 29.9 pg (ref 26.0–34.0)
MCHC: 32.1 g/dL (ref 30.0–36.0)
MCV: 93.2 fL (ref 80.0–100.0)
Monocytes Absolute: 0.3 10*3/uL (ref 0.1–1.0)
Monocytes Relative: 6 %
Neutro Abs: 3 10*3/uL (ref 1.7–7.7)
Neutrophils Relative %: 63 %
Platelet Count: 255 10*3/uL (ref 150–400)
RBC: 2.81 MIL/uL — ABNORMAL LOW (ref 4.22–5.81)
RDW: 12.8 % (ref 11.5–15.5)
WBC Count: 4.7 10*3/uL (ref 4.0–10.5)
nRBC: 0 % (ref 0.0–0.2)

## 2023-04-29 LAB — LACTATE DEHYDROGENASE: LDH: 117 U/L (ref 98–192)

## 2023-04-29 MED ORDER — EPOETIN ALFA-EPBX 20000 UNIT/ML IJ SOLN
20000.0000 [IU] | Freq: Once | INTRAMUSCULAR | Status: AC
  Administered 2023-04-29: 20000 [IU] via SUBCUTANEOUS
  Filled 2023-04-29: qty 1

## 2023-04-29 NOTE — Progress Notes (Signed)
[facility] CONSULT NOTE  Patient Care Team: Alfredia Ferguson, PA-C as PCP - General (Physician Assistant) Debbe Odea, MD as PCP - Cardiology (Cardiology) Isla Pence, OD (Optometry) Solum, Marlana Salvage, MD as Physician Assistant (Endocrinology) Riki Altes, MD (Urology) Lorain Childes, MD as Consulting Physician (Nephrology) Toney Reil, MD as Consulting Physician (Gastroenterology) Wenda Overland, LCSW as Social Worker Delles, Jackelyn Poling, RPH-CPP as Pharmacist Earna Coder, MD as Consulting Physician (Internal Medicine)  CHIEF COMPLAINTS/PURPOSE OF CONSULTATION: ANEMIA  HEMATOLOGY HISTORY  # Hx of right Upper lobe lung cancer [[facility]]- no chemo/RT.   # ANEMIA[Hb; MCV-platelets- WBC; Iron sat; ferritin;  GFR- IV CT/US; EGD/colonoscopy-[2022- [facility]]  # CKD- IV[Dr.Korrapati]   Latest Reference Range & Units 08/13/22 14:04  Iron 38 - 169 ug/dL 67  UIBC 161 - 096 ug/dL 045  TIBC 409 - 811 ug/dL 914  Ferritin 30 - 782 ng/mL 28 (L)  Iron Saturation 15 - 55 % 23  Vitamin B12 232 - 1,245 pg/mL 629  Globulin, Total 1.5 - 4.5 g/dL 2.2  WBC 3.4 - 95.6 O13Y8/MV 4.3  RBC 4.14 - 5.80 x10E6/uL 3.19 (L)  Hemoglobin 13.0 - 17.7 g/dL 9.7 (L)  HCT 78.4 - 69.6 % 28.8 (L)  MCV 79 - 97 fL 90  MCH 26.6 - 33.0 pg 30.4  MCHC 31.5 - 35.7 g/dL 29.5  RDW 28.4 - 13.2 % 12.5  Platelets 150 - 450 x10E3/uL 200  (L): Data is abnormally low  HISTORY OF PRESENTING ILLNESS: Alone alone.  Ambulating independently.  Nicholas Nolan 72 y.o.  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 He has received IV iron and now started retacrit. Tolerating well. Feels better since starting medication. Has more energy. Cold intolerance and restless legs have also improved. Continue to deny  shortness of breath, cough, black or bloody stools.     Review of Systems  Constitutional:  Positive for malaise/fatigue. Negative for chills, diaphoresis, fever and weight loss.  HENT:  Negative for nosebleeds and sore throat.   Eyes:  Negative for double vision.  Respiratory:  Negative for cough, hemoptysis, sputum production, shortness of breath and wheezing.   Cardiovascular:  Negative for chest pain, palpitations, orthopnea and leg swelling.  Gastrointestinal:  Negative for abdominal pain, blood in stool, constipation, diarrhea, heartburn, melena, nausea and vomiting.  Genitourinary:  Negative for dysuria, frequency and urgency.  Musculoskeletal:  Negative for back pain and joint pain.  Skin: Negative.  Negative for itching and rash.  Neurological:  Positive for tingling. Negative for dizziness, focal weakness, weakness and headaches.  Endo/Heme/Allergies:  Does not bruise/bleed easily.  Psychiatric/Behavioral:  Negative for depression. The patient is not nervous/anxious and does not have insomnia.      MEDICAL HISTORY:  Past Medical History:  Diagnosis Date   Arthritis    hands   Back pain    leg pain   CAD (coronary artery disease)    a. 10/2019 Cor CTA: LM nl, LAD nl, LCX distal dzs w/ FFR 0.65-0.75. RCA nl-->Med Rx.   CKD (chronic kidney disease), stage III (HCC)    Claudication (HCC)    Diabetes mellitus    type 2   Diastolic dysfunction    a. 08/2019 Echo: EF 60-65%, mild LVH, impaired relaxation. Nl RV size/fxn. Nl LA szie.   History of urinary retention  Hyperlipidemia    Hypertension    a. 05/2019 renal u/s concerning for R RAS; b. 06/2019 Renal artery angio: no more than15% bilat RAS.   Neuropathy    feet   Primary cancer of right upper lobe of lung (HCC) 2014   RUL Lobectomy   Wears dentures    full upper    SURGICAL HISTORY: Past Surgical History:  Procedure Laterality Date   BASAL CELL CARCINOMA EXCISION     COLONOSCOPY WITH PROPOFOL N/A 12/25/2019    Procedure: COLONOSCOPY WITH PROPOFOL;  Surgeon: Toney Reil, MD;  Location[facility];  Service: Endoscopy;  Laterality: N/A;  Diabetic - injectable and oral meds   COLONOSCOPY WITH PROPOFOL N/A 10/08/2021   Procedure: COLONOSCOPY WITH PROPOFOL;  Surgeon: Wyline Mood, MD;  Location[facility];  Service: Gastroenterology;  Laterality: N/A;   LUMBAR LAMINECTOMY/DECOMPRESSION MICRODISCECTOMY  01/10/2012   Procedure: LUMBAR LAMINECTOMY/DECOMPRESSION MICRODISCECTOMY 2 LEVELS;  Surgeon: Hewitt Shorts, MD;  Location: [facility] NEURO [facility];  Service: Neurosurgery;  Laterality: Bilateral;  Lumbar four-Sacral One laminectomies   LUNG SURGERY Right 11/22/12   right upper lobe   POLYPECTOMY N/A 12/25/2019   Procedure: POLYPECTOMY;  Surgeon: Toney Reil, MD;  Location: [facility];  Service: Endoscopy;  Laterality: N/A;   PROSTATE SURGERY     biopsy-due to elelvated PSA   RENAL ANGIOGRAPHY Right 07/02/2019   Procedure: RENAL ANGIOGRAPHY;  Surgeon: Annice Needy, MD;  Location: [facility] INVASIVE CV LAB;  Service: Cardiovascular;  Laterality: Right;   SHOULDER SURGERY     VASECTOMY      SOCIAL HISTORY: Social History   Socioeconomic History   Marital status: Married    Spouse name: Not on file   Number of children: 4   Years of education: Not on file   Highest education level: Associate degree: occupational, Scientist, product/process development, or vocational program  Occupational History   Occupation: retired  Tobacco Use   Smoking status: Former    Packs/day: 2.00    Years: 45.00    Additional pack years: 0.00    Total pack years: 90.00    Types: Cigarettes    Quit date: 11/28/2012    Years since quitting: 10.4   Smokeless tobacco: Current    Types: Snuff  Vaping Use   Vaping Use: Never used  Substance and Sexual Activity   Alcohol use: No   Drug use: No   Sexual activity: Yes    Birth control/protection: None  Other Topics Concern   Not on file  Social History Narrative   Lives in  with wife; daughter; mother [alzheimer]; quit smoking- 2015 [lung cancer dx]; no alcohol; retd- air Psychiatric nurse.    Social Determinants of Health   Financial Resource Strain: Low Risk  (01/12/2023)   Overall Financial Resource Strain (CARDIA)    Difficulty of Paying Living Expenses: Not hard at all  Food Insecurity: No Food Insecurity (02/25/2022)   Hunger Vital Sign    Worried About Running Out of Food in the Last Year: Never true    Ran Out of Food in the Last Year: Never true  Transportation Needs: No Transportation Needs (01/12/2023)   PRAPARE - Administrator, Civil Service (Medical): No    Lack of Transportation (Non-Medical): No  Physical Activity: Inactive (01/12/2023)   Exercise Vital Sign    Days of Exercise per Week: 0 days    Minutes of Exercise per Session: 0 min  Stress: Stress Concern Present (01/12/2023)   [employer]  of Occupational Health - Occupational Stress Questionnaire    Feeling of Stress : To some extent  Social Connections: Moderately Isolated (01/12/2023)   Social Connection and Isolation Panel [NHANES]    Frequency of Communication with Friends and Family: More than three times a week    Frequency of Social Gatherings with Friends and Family: More than three times a week    Attends Religious Services: Never    Database administrator or Organizations: No    Attends Banker Meetings: Never    Marital Status: Married  Catering manager Violence: Not At Risk (01/12/2023)   Humiliation, Afraid, Rape, and Kick questionnaire    Fear of Current or Ex-Partner: No    Emotionally Abused: No    Physically Abused: No    Sexually Abused: No    FAMILY HISTORY: Family History  Problem Relation Age of Onset   Cancer Father        prostate   Heart disease Father        MI   Stroke Father    Cancer Brother        liver    ALLERGIES:  is allergic to codeine, hydromorphone, and morphine and codeine.  MEDICATIONS:  Current Outpatient  Medications  Medication Sig Dispense Refill   azithromycin (ZITHROMAX) 250 MG tablet Take 2 tablets on day 1, then 1 tablet daily on days 2 through 5 6 tablet 0   carvedilol (COREG) 6.25 MG tablet Take 1 tablet (6.25 mg total) by mouth 2 (two) times daily. 180 tablet 1   Dulaglutide (TRULICITY) 1.5 MG/0.5ML SOPN Inject into the skin.     Dulaglutide 1.5 MG/0.5ML SOPN Inject 1.5 mg into the skin once a week.     FARXIGA 10 MG TABS tablet Take 10 mg by mouth every morning.     glucose blood (ONETOUCH VERIO) test strip      hydrALAZINE (APRESOLINE) 50 MG tablet Take 1 tablet (50 mg total) by mouth 3 (three) times daily. 180 tablet 1   olmesartan (BENICAR) 5 MG tablet Take 5 mg by mouth daily.     ondansetron (ZOFRAN-ODT) 4 MG disintegrating tablet Take 1 tablet (4 mg total) by mouth every 8 (eight) hours as needed for nausea or vomiting. 20 tablet 0   sertraline (ZOLOFT) 50 MG tablet TAKE 1 TABLET BY MOUTH EVERY DAY 90 tablet 1   tamsulosin (FLOMAX) 0.4 MG CAPS capsule TAKE 1 CAPSULE BY MOUTH EVERY DAY AFTER SUPPER 90 capsule 1   traZODone (DESYREL) 50 MG tablet Take 0.5-1 tablets (25-50 mg total) by mouth at bedtime as needed for sleep. 90 tablet 1   cilostazol (PLETAL) 50 MG tablet Take 1 tablet (50 mg total) by mouth 2 (two) times daily. (Patient not taking: Reported on 04/26/2023) 180 tablet 0   finasteride (PROSCAR) 5 MG tablet TAKE 1 TABLET (5 MG TOTAL) BY MOUTH DAILY. (Patient not taking: Reported on 04/26/2023) 90 tablet 3   No current facility-administered medications for this visit.    PHYSICAL EXAMINATION: Vitals:   04/29/23 1309  BP: (!) 133/47  Pulse: 60  Temp: (!) 97.4 F (36.3 C)  SpO2: 100%   Filed Weights   04/29/23 1309  Weight: 161 lb 11.2 oz (73.3 kg)   Physical Exam Vitals reviewed.  Constitutional:      Appearance: He is not ill-appearing.  HENT:     Head: Normocephalic and atraumatic.  Cardiovascular:     Rate and Rhythm: Normal rate and regular rhythm.  Pulmonary:     Effort: No respiratory distress.     Comments: Decreased breath sounds bilaterally.  Abdominal:     General: There is no distension.     Palpations: Abdomen is soft.  Musculoskeletal:        General: No deformity.  Skin:    General: Skin is warm.     Coloration: Skin is not pale.  Neurological:     Mental Status: He is alert and oriented to person, place, and time.  Psychiatric:        Mood and Affect: Mood normal.        Behavior: Behavior normal.      LABORATORY DATA:  I have reviewed the data as listed Lab Results  Component Value Date   WBC 4.7 04/29/2023   HGB 8.4 (L) 04/29/2023   HCT 26.2 (L) 04/29/2023   MCV 93.2 04/29/2023   PLT 255 04/29/2023   Recent Labs    08/13/22 1404 10/18/22 1320 02/07/23 1311 03/04/23 1231 04/26/23 1407 04/29/23 1242  NA 138   < > 135 137 140 139  K 4.4   < > 4.9 4.6 4.6 5.0  CL 104   < > 110 111 105 107  CO2 20   < > 19* 21* 22 22  GLUCOSE 243*   < > 107* 116* 144* 123*  BUN 49*   < > 75* 68* 56* 50*  CREATININE 2.84*   < > 4.08* 3.17* 3.39* 3.25*  CALCIUM 8.3*   < > 8.4* 8.3* 8.8 8.7*  GFRNONAA  --    < > 15* 20*  --  19*  PROT 6.2  --  6.4*  --  5.6*  --   ALBUMIN 4.0  --  3.7  --  3.5*  --   AST 9  --  11*  --  10  --   ALT 8  --  8  --  4  --   ALKPHOS 85  --  51  --  67  --   BILITOT <0.2  --  0.3  --  <0.2  --    < > = values in this interval not displayed.   Iron/TIBC/Ferritin/ %Sat    Component Value Date/Time   IRON 89 01/17/2023 1247   IRON 67 08/13/2022 1404   TIBC 333 01/17/2023 1247   TIBC 297 08/13/2022 1404   FERRITIN 78 01/17/2023 1247   FERRITIN 28 (L) 08/13/2022 1404   IRONPCTSAT 27 01/17/2023 1247   IRONPCTSAT 23 08/13/2022 1404      No results found.  ASSESSMENT & PLAN:   # Chronic anemia [OCT  2023]-9 .7; ferritin 17; saturation 28%-likely secondary CKD/IDA- Symptomatic [fatigue/restless leg] ; DEC 2023- MM - NEG;/Light chains- 1.88- sec to CKD   # Status post IV  infusions; on PO iron pills [no GI issues]-March 2024-ferritin 78 iron saturation 27-  hemoglobin today-8.4  HOLD venofer today. Also discussed the role of Retacrit boosting the hemoglobin.  Proceed with retacrit today. Plan to increase dose to 20,000 units every 2 weeks. Symptomatically improving.    # Mild leukopenia- May 2024 white count 3.2 ANC 1.4. Normalized today. Asymptomatic.   # Unintentional weight loss- [facility] 2024- CT scan chest and pelvis noncontrast-negative for any obvious malignancy. CT scan - Multinodular thyroid- [defer to PCP] for further work up. Has not had workup performed yet.    #Hypertension- improved. Systolic previously in 160s. Continue home monitoring.     # CKD stage IV- [DM/HTN;  Dr.Korrapati]-follow-up- GFR 19- stable.    # DM [Hb A1C-5.3]- trulicity/ SGLT2 inhbitors- stable   # CHF- chronic [Dr.Etang]- stable   # 6 mm Left lung nodule; Prior Hx of Lung cancer Right lung [s/p Lobectomy- 2014? [facility]]- follow up CT scan in 6 months- Oct 2024. .   # Incidental findings on Imaging  CT , 2024:Atheromatous calcifications;  Evidence of chronic pancreatitis; Gallbladder sludge.; Bilateral nephrolithiasis without hydronephrosis; Urinary bladder wall thickening; Enlarged prostate.I reviewed/discussed/counseled the patient.    DISPOSITION:  HOLD venofer today; Retacrit 2 weeks- labs- H&H-possible retacrit- 4 weeks- labs- H&H-possible retacrit-  6 weeks- labs- H&H-possible retacrit-  8 weeks- labs- cbc bmp, ferritin, iron studies, ldh; Dr Donneta Romberg, +/- venofer OR retacrit - la  No problem-specific Assessment & Plan notes found for this encounter.  All questions were answered. The patient knows to call the clinic with any problems, questions or concerns.  Alinda Dooms, NP 04/29/2023

## 2023-05-02 NOTE — Progress Notes (Addendum)
Celso Amy, PA-C [address]  [address]  [place], [place] [address]  Main: [phone]  Fax: [phone]   Primary Care Physician: Alfredia Ferguson, PA-C  Primary Gastroenterologist:  Dr. Wyline Mood / Celso Amy, PA-C   CC: Follow-up abnormal CT of the pancreas; chronic pancreatitis; chronic diarrhea  HPI: Nicholas Nolan is a 72 y.o. male  He last saw Dr. Tobi Bastos for chronic diarrhea 11/2021.  Also had gas and bloating.  Diarrhea was thought due to irritable bowel syndrome versus adverse side effect of olmesartan.  He was treated with Xifaxan for IBS.    10/2021: Colonoscopy: 3 small tubular adenoma and 1 hyperplastic polyps resected.  12/2019: Colonoscopy : fair prep- 5 polyps resected 4/5 adenomas.    09/2021: celiac serology negative, stool for GI PCR was negative as well as fecal calprotectin.  He has not had any biopsies to check for microscopic colitis.  He has had worsening anemia and kidney function in the past 3 months. Labs 04/29/2023: Hemoglobin 8.4, hematocrit 26, MCV 93, BUN 50, creatinine 3.25, GFR 19.   Lab 04/26/23: Normal HgbA1c of 5.3.  Normal Lipase 55.  Normal LFTs.  CT chest, abdomen, and pelvis without contrast done 02/15/2023, to evaluate weight loss, showed evidence of chronic pancreatitis, gallbladder sludge, multinodular thyroid, prostamegaly, urinary bladder thickening.  He is followed by urology.  There were pancreatic calcifications consistent with chronic pancreatitis.  No acute pancreas inflammation or lesions.  Normal liver.  No bowel inflammation.  GI symptoms? Alcohol?  Current Outpatient Medications  Medication Sig Dispense Refill   carvedilol (COREG) 6.25 MG tablet Take 1 tablet (6.25 mg total) by mouth 2 (two) times daily. 180 tablet 1   cilostazol (PLETAL) 50 MG tablet Take 1 tablet (50 mg total) by mouth 2 (two) times daily. (Patient not taking: Reported on 04/26/2023) 180 tablet 0   Dulaglutide (TRULICITY) 1.5 MG/0.5ML SOPN Inject  into the skin.     Dulaglutide 1.5 MG/0.5ML SOPN Inject 1.5 mg into the skin once a week.     FARXIGA 10 MG TABS tablet Take 10 mg by mouth every morning.     finasteride (PROSCAR) 5 MG tablet TAKE 1 TABLET (5 MG TOTAL) BY MOUTH DAILY. (Patient not taking: Reported on 04/26/2023) 90 tablet 3   glucose blood (ONETOUCH VERIO) test strip      hydrALAZINE (APRESOLINE) 50 MG tablet Take 1 tablet (50 mg total) by mouth 3 (three) times daily. 180 tablet 1   olmesartan (BENICAR) 5 MG tablet Take 5 mg by mouth daily.     ondansetron (ZOFRAN-ODT) 4 MG disintegrating tablet Take 1 tablet (4 mg total) by mouth every 8 (eight) hours as needed for nausea or vomiting. 20 tablet 0   sertraline (ZOLOFT) 50 MG tablet TAKE 1 TABLET BY MOUTH EVERY DAY 90 tablet 1   tamsulosin (FLOMAX) 0.4 MG CAPS capsule TAKE 1 CAPSULE BY MOUTH EVERY DAY AFTER SUPPER 90 capsule 1   traZODone (DESYREL) 50 MG tablet Take 0.5-1 tablets (25-50 mg total) by mouth at bedtime as needed for sleep. 90 tablet 1   No current facility-administered medications for this visit.    Allergies as of 05/03/2023 - Review Complete 04/29/2023  Allergen Reaction Noted   Codeine Itching 01/10/2012   Hydromorphone Itching 05/07/2016   Morphine and codeine Itching 05/07/2016    Past Medical History:  Diagnosis Date   Arthritis    hands   Back pain    leg pain   CAD (coronary  artery disease)    a. 10/2019 Cor CTA: LM nl, LAD nl, LCX distal dzs w/ FFR 0.65-0.75. RCA nl-->Med Rx.   CKD (chronic kidney disease), stage III (HCC)    Claudication (HCC)    Diabetes mellitus    type 2   Diastolic dysfunction    a. 08/2019 Echo: EF 60-65%, mild LVH, impaired relaxation. Nl RV size/fxn. Nl LA szie.   History of urinary retention    Hyperlipidemia    Hypertension    a. 05/2019 renal u/s concerning for R RAS; b. 06/2019 Renal artery angio: no more than15% bilat RAS.   Neuropathy    feet   Primary cancer of right upper lobe of lung (HCC) 2014   RUL  Lobectomy   Wears dentures    full upper    Past Surgical History:  Procedure Laterality Date   BASAL CELL CARCINOMA EXCISION     COLONOSCOPY WITH PROPOFOL N/A 12/25/2019   Procedure: COLONOSCOPY WITH PROPOFOL;  Surgeon: Toney Reil, MD;  Location: [facility];  Service: Endoscopy;  Laterality: N/A;  Diabetic - injectable and oral meds   COLONOSCOPY WITH PROPOFOL N/A 10/08/2021   Procedure: COLONOSCOPY WITH PROPOFOL;  Surgeon: Wyline Mood, MD;  Location: [facility] ENDOSCOPY;  Service: Gastroenterology;  Laterality: N/A;   LUMBAR LAMINECTOMY/DECOMPRESSION MICRODISCECTOMY  01/10/2012   Procedure: LUMBAR LAMINECTOMY/DECOMPRESSION MICRODISCECTOMY 2 LEVELS;  Surgeon: Hewitt Shorts, MD;  Location: [facility]UR[facility];  Service: Neurosurgery;  Laterality: Bilateral;  Lumbar four-Sacral One laminectomies   LUNG SURGERY Right 11/22/12   right upper lobe   POLYPECTOMY N/A 12/25/2019   Procedure: POLYPECTOMY;  Surgeon: Toney Reil, MD;  Location: [facility];  Service: Endoscopy;  Laterality: N/A;   PROSTATE SURGERY     biopsy-due to elelvated PSA   RENAL ANGIOGRAPHY Right 07/02/2019   Procedure: RENAL ANGIOGRAPHY;  Surgeon: Annice Needy, MD;  Location: [facility] INVASIVE CV LAB;  Service: Cardiovascular;  Laterality: Right;   SHOULDER SURGERY     VASECTOMY      Review of Systems:    All systems reviewed and negative except where noted in HPI.   Physical Examination:   There were no vitals taken for this visit.  General: Well-nourished, well-developed in no acute distress.  Eyes: No icterus. Conjunctivae pink. Mouth: Oropharyngeal mucosa moist and pink , no lesions erythema or exudate. Lungs: Clear to auscultation bilaterally. Non-labored. Heart: Regular rate and rhythm, no murmurs rubs or gallops.  Abdomen: Bowel sounds are normal; Abdomen is Soft; No hepatosplenomegaly, masses or hernias;  No Abdominal Tenderness; No guarding or rebound tenderness. Extremities: No lower  extremity edema. No clubbing or deformities. Neuro: Alert and oriented x 3.  Grossly intact. Skin: Warm and dry, no jaundice.   Psych: Alert and cooperative, normal mood and affect.   Imaging Studies: No results found.  Assessment and Plan:   Nicholas Nolan is a 72 y.o. y/o male presents for:  1.  Abnormal CT of the pancreas - Chronic pancreatitis  Order abdominal MRI/MRCP without contrast  Avoid all alcohol  2.  Chronic diarrhea  Check pancreatic fecal elastase  Evaluate for pancreatic insufficiency  Trial of Creon  3. Gallbladder sludge -asymptomatic  4.  History of adenomatous colon polyps  Repeat colonoscopy in 5 years (10/2026)  5.  Stage IV severe chronic kidney disease  Follow-up with PCP and nephrology    Celso Amy, PA-C  Follow up in ***  BP check ***

## 2023-05-03 ENCOUNTER — Telehealth: Payer: Self-pay | Admitting: Physician Assistant

## 2023-05-03 ENCOUNTER — Encounter: Payer: Self-pay | Admitting: Physician Assistant

## 2023-05-03 ENCOUNTER — Other Ambulatory Visit: Payer: Self-pay | Admitting: Physician Assistant

## 2023-05-03 ENCOUNTER — Ambulatory Visit: Payer: Medicare PPO | Admitting: Physician Assistant

## 2023-05-03 VITALS — BP 156/57 | HR 64 | Temp 97.8°F | Ht 69.0 in | Wt 159.6 lb

## 2023-05-03 DIAGNOSIS — N184 Chronic kidney disease, stage 4 (severe): Secondary | ICD-10-CM

## 2023-05-03 DIAGNOSIS — Z86010 Personal history of colonic polyps: Secondary | ICD-10-CM | POA: Diagnosis not present

## 2023-05-03 DIAGNOSIS — K529 Noninfective gastroenteritis and colitis, unspecified: Secondary | ICD-10-CM

## 2023-05-03 DIAGNOSIS — K861 Other chronic pancreatitis: Secondary | ICD-10-CM | POA: Diagnosis not present

## 2023-05-04 ENCOUNTER — Telehealth: Payer: Self-pay

## 2023-05-04 ENCOUNTER — Other Ambulatory Visit: Payer: Self-pay

## 2023-05-04 DIAGNOSIS — K861 Other chronic pancreatitis: Secondary | ICD-10-CM

## 2023-05-04 NOTE — Telephone Encounter (Signed)
MRCP scheduled 05/11/23 at 7:45 am [facility]. Patient notified and verbalized understanding.

## 2023-05-04 NOTE — Telephone Encounter (Signed)
Left message for patient to return call to opffice.  MRCP scheduled 05/11/23 @ arrive at 7:45. Nothing to eat/drink 4 hours prior. [facility] medical mall.

## 2023-05-04 NOTE — Telephone Encounter (Signed)
MRI / MRCP has been scheduled at [facility].

## 2023-05-08 DIAGNOSIS — K861 Other chronic pancreatitis: Secondary | ICD-10-CM | POA: Diagnosis not present

## 2023-05-08 DIAGNOSIS — K529 Noninfective gastroenteritis and colitis, unspecified: Secondary | ICD-10-CM | POA: Diagnosis not present

## 2023-05-11 ENCOUNTER — Ambulatory Visit
Admission: RE | Admit: 2023-05-11 | Discharge: 2023-05-11 | Disposition: A | Discharge status: Home / Self Care | Payer: Medicare PPO | Source: Ambulatory Visit | Attending: Physician Assistant | Admitting: Physician Assistant

## 2023-05-11 ENCOUNTER — Other Ambulatory Visit: Payer: Self-pay | Admitting: Physician Assistant

## 2023-05-11 DIAGNOSIS — I700 Atherosclerosis of aorta: Secondary | ICD-10-CM | POA: Diagnosis not present

## 2023-05-11 DIAGNOSIS — R918 Other nonspecific abnormal finding of lung field: Secondary | ICD-10-CM | POA: Diagnosis not present

## 2023-05-11 DIAGNOSIS — K838 Other specified diseases of biliary tract: Secondary | ICD-10-CM | POA: Diagnosis not present

## 2023-05-11 DIAGNOSIS — K861 Other chronic pancreatitis: Secondary | ICD-10-CM

## 2023-05-11 MED ORDER — GADOBUTROL 1 MMOL/ML IV SOLN
7.0000 mL | Freq: Once | INTRAVENOUS | Status: AC | PRN
  Administered 2023-05-11: 7 mL via INTRAVENOUS

## 2023-05-13 ENCOUNTER — Inpatient Hospital Stay: Payer: Medicare PPO | Attending: Internal Medicine

## 2023-05-13 ENCOUNTER — Inpatient Hospital Stay: Payer: Medicare PPO

## 2023-05-13 VITALS — BP 141/47

## 2023-05-13 DIAGNOSIS — D631 Anemia in chronic kidney disease: Secondary | ICD-10-CM | POA: Insufficient documentation

## 2023-05-13 DIAGNOSIS — N184 Chronic kidney disease, stage 4 (severe): Secondary | ICD-10-CM | POA: Diagnosis not present

## 2023-05-13 DIAGNOSIS — D649 Anemia, unspecified: Secondary | ICD-10-CM

## 2023-05-13 LAB — HEMOGLOBIN AND HEMATOCRIT, BLOOD
HCT: 28 % — ABNORMAL LOW (ref 39.0–52.0)
Hemoglobin: 9.1 g/dL — ABNORMAL LOW (ref 13.0–17.0)

## 2023-05-13 MED ORDER — EPOETIN ALFA-EPBX 20000 UNIT/ML IJ SOLN
20000.0000 [IU] | Freq: Once | INTRAMUSCULAR | Status: AC
  Administered 2023-05-13: 20000 [IU] via SUBCUTANEOUS
  Filled 2023-05-13: qty 1

## 2023-05-16 LAB — PANCREATIC ELASTASE, FECAL: Pancreatic Elastase, Fecal: >800 ug Elast./g (ref >200)

## 2023-05-17 ENCOUNTER — Other Ambulatory Visit: Payer: Medicare PPO

## 2023-05-20 ENCOUNTER — Encounter: Payer: Self-pay | Admitting: Urology

## 2023-05-20 ENCOUNTER — Telehealth: Payer: Self-pay

## 2023-05-20 ENCOUNTER — Ambulatory Visit: Payer: Medicare PPO | Admitting: Urology

## 2023-05-20 VITALS — BP 129/62 | HR 56 | Ht 69.0 in | Wt 169.0 lb

## 2023-05-20 DIAGNOSIS — N401 Enlarged prostate with lower urinary tract symptoms: Secondary | ICD-10-CM | POA: Diagnosis not present

## 2023-05-20 DIAGNOSIS — R972 Elevated prostate specific antigen [PSA]: Secondary | ICD-10-CM

## 2023-05-20 MED ORDER — TAMSULOSIN HCL 0.4 MG PO CAPS
0.4000 mg | ORAL_CAPSULE | Freq: Every day | ORAL | 3 refills | Status: DC
Start: 2023-05-20 — End: 2024-05-10

## 2023-05-20 NOTE — Progress Notes (Signed)
I, Nicholas Nolan,acting as a scribe for Nicholas Altes, MD.,have documented all relevant documentation on the behalf of Nicholas Altes, MD,as directed by  Nicholas Altes, MD while in the presence of Nicholas Altes, MD.  05/20/2023 10:49 AM   Nicholas Nolan 22-Aug-1951 [id number]  Referring provider: Alfredia Ferguson, PA-C [address] [place],  [place] [address]  Chief Complaint  Patient presents with   Follow-up   Urologic history: 1.  Elevated PSA Prostate biopsy 2010 PSA 5.4; volume 64 cc; benign PSA 8.3 05/2016; MRI 103 cc volume, PI-RADS 3  MR fusion biopsy 08/2016, 82 cc volume, ROI and 12 core template benign PSA rise 16.8 10/2019; repeat PSA 12/2019 remains elevated 16.3 Repeat MRI 128 cc gland; contrast not given secondary to GFR 2 "intermediate suspicious lesions" seen in left PZ with mild capsular bulging and 2 other lesions NTZ MR fusion biopsy 02/2020; 139 cc gland; 4 biopsies each were taken of 3 ROI lesions in addition to a standard 12 core biopsy; all cores with benign prostate tissue and foci of acute/chronic inflammation   2.  BPH with history urinary retention Successful voiding trial On tamsulosin/finasteride  HPI: Nicholas Nolan is a 72 y.o. male presents for annual follow up.   PSA 04/26/23 stable at 13.1  Since his last visit he stopped his finasteride approximately 2 months ago and ran out of tamsulosin. He has noted urinary hesitancy in the morning. No dysuria or gross hematuria. No flank, abdominal, or pelvic pain.  PSA trend  Prostate Specific Ag, Serum  Latest Ref Rng 0.0 - 4.0 ng/mL  11/06/2019 16.8 (H)   12/24/2019 16.3 (H)   10/14/2020 20.0 (H)   05/18/2021 9.9 (H)   11/23/2021 14.2 (H)   05/28/2022 8.4 (H)   12/06/2022 11.5 (H)   04/26/2023 13.1 (H)      PMH: Past Medical History:  Diagnosis Date   Arthritis    hands   Back pain    leg pain   CAD (coronary artery disease)    a. 10/2019 Cor CTA: LM nl, LAD nl, LCX distal  dzs w/ FFR 0.65-0.75. RCA nl-->Med Rx.   CKD (chronic kidney disease), stage III (HCC)    Claudication (HCC)    Diabetes mellitus    type 2   Diastolic dysfunction    a. 08/2019 Echo: EF 60-65%, mild LVH, impaired relaxation. Nl RV size/fxn. Nl LA szie.   History of urinary retention    Hyperlipidemia    Hypertension    a. 05/2019 renal u/s concerning for R RAS; b. 06/2019 Renal artery angio: no more than15% bilat RAS.   Neuropathy    feet   Primary cancer of right upper lobe of lung (HCC) 2014   RUL Lobectomy   Wears dentures    full upper    Surgical History: Past Surgical History:  Procedure Laterality Date   BASAL CELL CARCINOMA EXCISION     COLONOSCOPY WITH PROPOFOL N/A 12/25/2019   Procedure: COLONOSCOPY WITH PROPOFOL;  Surgeon: Nicholas Reil, MD;  Location[facility];  Service: Endoscopy;  Laterality: N/A;  Diabetic - injectable and oral meds   COLONOSCOPY WITH PROPOFOL N/A 10/08/2021   Procedure: COLONOSCOPY WITH PROPOFOL;  Surgeon: Nicholas Mood, MD;  Location: [facility] ENDOSCOPY;  Service: Gastroenterology;  Laterality: N/A;   LUMBAR LAMINECTOMY/DECOMPRESSION MICRODISCECTOMY  01/10/2012   Procedure: LUMBAR LAMINECTOMY/DECOMPRESSION MICRODISCECTOMY 2 LEVELS;  Surgeon: Nicholas Shorts, MD;  Location: [facility];  Service: Neurosurgery;  Laterality: Bilateral;  Lumbar four-Sacral One laminectomies   LUNG SURGERY Right 11/22/12   right upper lobe   POLYPECTOMY N/A 12/25/2019   Procedure: POLYPECTOMY;  Surgeon: Nicholas Reil, MD;  Location: [facility];  Service: Endoscopy;  Laterality: N/A;   PROSTATE SURGERY     biopsy-due to elelvated PSA   RENAL ANGIOGRAPHY Right 07/02/2019   Procedure: RENAL ANGIOGRAPHY;  Surgeon: Nicholas Needy, MD;  Location: [facility] INVASIVE CV LAB;  Service: Cardiovascular;  Laterality: Right;   SHOULDER SURGERY     VASECTOMY      Home Medications:  Allergies as of 05/20/2023       Reactions   Codeine Itching   And hyper also    Hydromorphone Itching   Morphine And Codeine Itching        Medication List        Accurate as of May 20, 2023 10:49 AM. If you have any questions, ask your nurse or doctor.          STOP taking these medications    cilostazol 50 MG tablet Commonly known as: PLETAL Stopped by: Nicholas Nolan   olmesartan 5 MG tablet Commonly known as: BENICAR Stopped by: Nicholas Nolan   ondansetron 4 MG disintegrating tablet Commonly known as: ZOFRAN-ODT Stopped by: Nicholas Nolan       TAKE these medications    carvedilol 6.25 MG tablet Commonly known as: COREG Take 1 tablet (6.25 mg total) by mouth 2 (two) times daily.   Farxiga 10 MG Tabs tablet Generic drug: dapagliflozin propanediol Take 10 mg by mouth every morning.   hydrALAZINE 50 MG tablet Commonly known as: APRESOLINE Take 1 tablet (50 mg total) by mouth 3 (three) times daily.   OneTouch Verio test strip Generic drug: glucose blood   tamsulosin 0.4 MG Caps capsule Commonly known as: FLOMAX Take 1 capsule (0.4 mg total) by mouth daily. Started by: Nicholas Nolan   Trulicity 1.5 MG/0.5ML Sopn Generic drug: Dulaglutide Inject into the skin. What changed: Another medication with the same name was removed. Continue taking this medication, and follow the directions you see here. Changed by: Nicholas Nolan        Allergies:  Allergies  Allergen Reactions   Codeine Itching    And hyper also   Hydromorphone Itching   Morphine And Codeine Itching    Family History: Family History  Problem Relation Age of Onset   Cancer Father        prostate   Heart disease Father        MI   Stroke Father    Cancer Brother        liver    Social History:  reports that he quit smoking about 10 years ago. His smoking use included cigarettes. He started smoking about 55 years ago. He has a 90 pack-year smoking history. His smokeless tobacco use includes snuff. He reports that he does not drink alcohol and  does not use drugs.   Physical Exam: BP 129/62   Pulse (!) 56   Ht 5\' 9"  (1.753 m)   Wt 169 lb (76.7 kg)   BMI 24.96 kg/m   Constitutional:  Alert and oriented, No acute distress. HEENT: [place] AT Respiratory: Normal respiratory effort, no increased work of breathing. GU: Declined DRE Psychiatric: Normal Nolan and affect.   Assessment & Plan:    1. BPH with LUTS  Has discontinued finasteride  Would like to continue tamsulosin- refill sent to  pharmacy   2. Elevated PSA  We discussed his PSA will most likely increase since he has stopped finasteride  Lab visit 6 months with PSA 1 year office follow up with PSA and DRE  He does have significant prostate enlargement and we did discuss HoLEP if he is interested in pursuing surgical management.   I have reviewed the above documentation for accuracy and completeness, and I agree with the above.   Nicholas Altes, MD  [facility] [address] [place], [place] [address] [phone]

## 2023-05-20 NOTE — Progress Notes (Signed)
Call and notify patient of results.  Not viewed in my chart.

## 2023-05-20 NOTE — Telephone Encounter (Signed)
Left message to call office on Monday

## 2023-05-22 NOTE — Progress Notes (Signed)
Notify pt.: Pancreatic fecal elastase test is normal. No evidence of pancreatic insufficiency as a cause for diarrhea.  Pt. Has not viewed result in My Chart.

## 2023-05-23 ENCOUNTER — Telehealth: Payer: Self-pay

## 2023-05-23 NOTE — Telephone Encounter (Signed)
Left detailed message and also asked patient to return call to office.   Notify pt.: Pancreatic fecal elastase test is normal.  No evidence of pancreatic insufficiency as a cause for diarrhea.  Pt. Has not viewed result in My Chart.

## 2023-05-25 DIAGNOSIS — N200 Calculus of kidney: Secondary | ICD-10-CM | POA: Diagnosis not present

## 2023-05-25 DIAGNOSIS — E875 Hyperkalemia: Secondary | ICD-10-CM | POA: Diagnosis not present

## 2023-05-25 DIAGNOSIS — E1122 Type 2 diabetes mellitus with diabetic chronic kidney disease: Secondary | ICD-10-CM | POA: Diagnosis not present

## 2023-05-25 DIAGNOSIS — R809 Proteinuria, unspecified: Secondary | ICD-10-CM | POA: Diagnosis not present

## 2023-05-25 DIAGNOSIS — N1832 Chronic kidney disease, stage 3b: Secondary | ICD-10-CM | POA: Diagnosis not present

## 2023-05-25 DIAGNOSIS — N184 Chronic kidney disease, stage 4 (severe): Secondary | ICD-10-CM | POA: Diagnosis not present

## 2023-05-25 DIAGNOSIS — N2581 Secondary hyperparathyroidism of renal origin: Secondary | ICD-10-CM | POA: Diagnosis not present

## 2023-05-25 DIAGNOSIS — I10 Essential (primary) hypertension: Secondary | ICD-10-CM | POA: Diagnosis not present

## 2023-05-25 DIAGNOSIS — K529 Noninfective gastroenteritis and colitis, unspecified: Secondary | ICD-10-CM | POA: Diagnosis not present

## 2023-05-27 ENCOUNTER — Inpatient Hospital Stay: Payer: Medicare PPO

## 2023-05-27 VITALS — BP 137/52

## 2023-05-27 DIAGNOSIS — D631 Anemia in chronic kidney disease: Secondary | ICD-10-CM | POA: Diagnosis not present

## 2023-05-27 DIAGNOSIS — N184 Chronic kidney disease, stage 4 (severe): Secondary | ICD-10-CM | POA: Diagnosis not present

## 2023-05-27 DIAGNOSIS — D649 Anemia, unspecified: Secondary | ICD-10-CM

## 2023-05-27 LAB — HEMOGLOBIN AND HEMATOCRIT, BLOOD
HCT: 30.4 % — ABNORMAL LOW (ref 39.0–52.0)
Hemoglobin: 9.6 g/dL — ABNORMAL LOW (ref 13.0–17.0)

## 2023-05-27 MED ORDER — EPOETIN ALFA-EPBX 20000 UNIT/ML IJ SOLN
20000.0000 [IU] | Freq: Once | INTRAMUSCULAR | Status: AC
  Administered 2023-05-27: 20000 [IU] via SUBCUTANEOUS

## 2023-05-31 DIAGNOSIS — N184 Chronic kidney disease, stage 4 (severe): Secondary | ICD-10-CM | POA: Diagnosis not present

## 2023-05-31 DIAGNOSIS — D631 Anemia in chronic kidney disease: Secondary | ICD-10-CM | POA: Diagnosis not present

## 2023-05-31 DIAGNOSIS — N2581 Secondary hyperparathyroidism of renal origin: Secondary | ICD-10-CM | POA: Diagnosis not present

## 2023-05-31 DIAGNOSIS — E875 Hyperkalemia: Secondary | ICD-10-CM | POA: Diagnosis not present

## 2023-05-31 DIAGNOSIS — E1122 Type 2 diabetes mellitus with diabetic chronic kidney disease: Secondary | ICD-10-CM | POA: Diagnosis not present

## 2023-05-31 DIAGNOSIS — K529 Noninfective gastroenteritis and colitis, unspecified: Secondary | ICD-10-CM | POA: Diagnosis not present

## 2023-05-31 DIAGNOSIS — R809 Proteinuria, unspecified: Secondary | ICD-10-CM | POA: Diagnosis not present

## 2023-05-31 DIAGNOSIS — N400 Enlarged prostate without lower urinary tract symptoms: Secondary | ICD-10-CM | POA: Diagnosis not present

## 2023-05-31 DIAGNOSIS — I10 Essential (primary) hypertension: Secondary | ICD-10-CM | POA: Diagnosis not present

## 2023-06-06 ENCOUNTER — Ambulatory Visit: Payer: Medicare PPO | Admitting: Physician Assistant

## 2023-06-10 ENCOUNTER — Inpatient Hospital Stay: Payer: Medicare PPO | Attending: Internal Medicine

## 2023-06-10 ENCOUNTER — Inpatient Hospital Stay: Payer: Medicare PPO

## 2023-06-10 DIAGNOSIS — E1122 Type 2 diabetes mellitus with diabetic chronic kidney disease: Secondary | ICD-10-CM | POA: Diagnosis not present

## 2023-06-10 DIAGNOSIS — N184 Chronic kidney disease, stage 4 (severe): Secondary | ICD-10-CM | POA: Diagnosis not present

## 2023-06-10 DIAGNOSIS — I130 Hypertensive heart and chronic kidney disease with heart failure and stage 1 through stage 4 chronic kidney disease, or unspecified chronic kidney disease: Secondary | ICD-10-CM | POA: Diagnosis not present

## 2023-06-10 DIAGNOSIS — Z87891 Personal history of nicotine dependence: Secondary | ICD-10-CM | POA: Diagnosis not present

## 2023-06-10 DIAGNOSIS — D631 Anemia in chronic kidney disease: Secondary | ICD-10-CM | POA: Diagnosis not present

## 2023-06-10 DIAGNOSIS — D649 Anemia, unspecified: Secondary | ICD-10-CM

## 2023-06-10 LAB — HEMOGLOBIN AND HEMATOCRIT, BLOOD
HCT: 31.5 % — ABNORMAL LOW (ref 39.0–52.0)
Hemoglobin: 10 g/dL — ABNORMAL LOW (ref 13.0–17.0)

## 2023-06-10 NOTE — Progress Notes (Signed)
Retacrit injection held today. Hgb 10.0

## 2023-06-14 DIAGNOSIS — E119 Type 2 diabetes mellitus without complications: Secondary | ICD-10-CM | POA: Diagnosis not present

## 2023-06-14 DIAGNOSIS — D3131 Benign neoplasm of right choroid: Secondary | ICD-10-CM | POA: Diagnosis not present

## 2023-06-14 DIAGNOSIS — H00025 Hordeolum internum left lower eyelid: Secondary | ICD-10-CM | POA: Diagnosis not present

## 2023-06-18 ENCOUNTER — Other Ambulatory Visit: Payer: Self-pay | Admitting: Physician Assistant

## 2023-06-18 DIAGNOSIS — G4700 Insomnia, unspecified: Secondary | ICD-10-CM

## 2023-06-23 ENCOUNTER — Other Ambulatory Visit: Payer: Self-pay

## 2023-06-23 DIAGNOSIS — D649 Anemia, unspecified: Secondary | ICD-10-CM

## 2023-06-24 ENCOUNTER — Inpatient Hospital Stay: Payer: Medicare PPO

## 2023-06-24 ENCOUNTER — Inpatient Hospital Stay: Payer: Medicare PPO | Admitting: Nurse Practitioner

## 2023-07-04 ENCOUNTER — Ambulatory Visit: Payer: Medicare PPO | Admitting: Gastroenterology

## 2023-07-08 ENCOUNTER — Encounter: Payer: Self-pay | Admitting: Nurse Practitioner

## 2023-07-08 ENCOUNTER — Inpatient Hospital Stay: Payer: Medicare PPO

## 2023-07-08 ENCOUNTER — Inpatient Hospital Stay (HOSPITAL_BASED_OUTPATIENT_CLINIC_OR_DEPARTMENT_OTHER): Payer: Medicare PPO | Admitting: Nurse Practitioner

## 2023-07-08 ENCOUNTER — Other Ambulatory Visit: Payer: Self-pay

## 2023-07-08 VITALS — BP 152/50 | HR 59

## 2023-07-08 VITALS — BP 139/46 | HR 60 | Temp 96.0°F | Resp 20 | Ht 69.0 in | Wt 168.0 lb

## 2023-07-08 DIAGNOSIS — D649 Anemia, unspecified: Secondary | ICD-10-CM

## 2023-07-08 DIAGNOSIS — N184 Chronic kidney disease, stage 4 (severe): Secondary | ICD-10-CM | POA: Diagnosis not present

## 2023-07-08 DIAGNOSIS — E1122 Type 2 diabetes mellitus with diabetic chronic kidney disease: Secondary | ICD-10-CM | POA: Diagnosis not present

## 2023-07-08 DIAGNOSIS — R911 Solitary pulmonary nodule: Secondary | ICD-10-CM

## 2023-07-08 DIAGNOSIS — D631 Anemia in chronic kidney disease: Secondary | ICD-10-CM | POA: Diagnosis not present

## 2023-07-08 DIAGNOSIS — I130 Hypertensive heart and chronic kidney disease with heart failure and stage 1 through stage 4 chronic kidney disease, or unspecified chronic kidney disease: Secondary | ICD-10-CM | POA: Diagnosis not present

## 2023-07-08 DIAGNOSIS — Z87891 Personal history of nicotine dependence: Secondary | ICD-10-CM | POA: Diagnosis not present

## 2023-07-08 LAB — CBC WITH DIFFERENTIAL/PLATELET
Abs Immature Granulocytes: 0.01 10*3/uL (ref 0.00–0.07)
Basophils Absolute: 0 10*3/uL (ref 0.0–0.1)
Basophils Relative: 1 %
Eosinophils Absolute: 0 10*3/uL (ref 0.0–0.5)
Eosinophils Relative: 1 %
HCT: 27.2 % — ABNORMAL LOW (ref 39.0–52.0)
Hemoglobin: 8.6 g/dL — ABNORMAL LOW (ref 13.0–17.0)
Immature Granulocytes: 0 %
Lymphocytes Relative: 29 %
Lymphs Abs: 1.1 10*3/uL (ref 0.7–4.0)
MCH: 30.3 pg (ref 26.0–34.0)
MCHC: 31.6 g/dL (ref 30.0–36.0)
MCV: 95.8 fL (ref 80.0–100.0)
Monocytes Absolute: 0.2 10*3/uL (ref 0.1–1.0)
Monocytes Relative: 6 %
Neutro Abs: 2.3 10*3/uL (ref 1.7–7.7)
Neutrophils Relative %: 63 %
Platelets: 150 10*3/uL (ref 150–400)
RBC: 2.84 MIL/uL — ABNORMAL LOW (ref 4.22–5.81)
RDW: 13.2 % (ref 11.5–15.5)
WBC: 3.7 10*3/uL — ABNORMAL LOW (ref 4.0–10.5)
nRBC: 0 % (ref 0.0–0.2)

## 2023-07-08 LAB — IRON AND TIBC
Iron: 92 ug/dL (ref 45–182)
Saturation Ratios: 32 % (ref 17.9–39.5)
TIBC: 286 ug/dL (ref 250–450)
UIBC: 194 ug/dL

## 2023-07-08 LAB — CMP (CANCER CENTER ONLY)
ALT: 9 U/L (ref 0–44)
AST: 14 U/L — ABNORMAL LOW (ref 15–41)
Albumin: 3.4 g/dL — ABNORMAL LOW (ref 3.5–5.0)
Alkaline Phosphatase: 57 U/L (ref 38–126)
Anion gap: 6 (ref 5–15)
BUN: 41 mg/dL — ABNORMAL HIGH (ref 8–23)
CO2: 28 mmol/L (ref 22–32)
Calcium: 8.3 mg/dL — ABNORMAL LOW (ref 8.9–10.3)
Chloride: 105 mmol/L (ref 98–111)
Creatinine: 3.15 mg/dL — ABNORMAL HIGH (ref 0.61–1.24)
GFR, Estimated: 20 mL/min — ABNORMAL LOW
Glucose, Bld: 131 mg/dL — ABNORMAL HIGH (ref 70–99)
Potassium: 4.5 mmol/L (ref 3.5–5.1)
Sodium: 139 mmol/L (ref 135–145)
Total Bilirubin: 0.5 mg/dL (ref 0.3–1.2)
Total Protein: 6.2 g/dL — ABNORMAL LOW (ref 6.5–8.1)

## 2023-07-08 LAB — LACTATE DEHYDROGENASE: LDH: 130 U/L (ref 98–192)

## 2023-07-08 LAB — FERRITIN: Ferritin: 56 ng/mL (ref 24–336)

## 2023-07-08 MED ORDER — SODIUM CHLORIDE 0.9 % IV SOLN
Freq: Once | INTRAVENOUS | Status: AC
  Filled 2023-07-08: qty 250

## 2023-07-08 MED ORDER — SODIUM CHLORIDE 0.9 % IV SOLN
200.0000 mg | Freq: Once | INTRAVENOUS | Status: AC
  Administered 2023-07-08: 200 mg via INTRAVENOUS
  Filled 2023-07-08: qty 200

## 2023-07-08 NOTE — Progress Notes (Signed)
Patient declined to wait the 30 minutes for post iron infusion observation today.  Post iron education done. Patient verbalized understanding. 

## 2023-07-08 NOTE — Progress Notes (Signed)
[name] CONSULT NOTE  Patient Care Team: Alfredia Ferguson, PA-C as PCP - General (Physician Assistant) Debbe Odea, MD as PCP - Cardiology (Cardiology) Isla Pence, OD (Optometry) Solum, Marlana Salvage, MD as Physician Assistant (Endocrinology) Riki Altes, MD (Urology) Lorain Childes, MD as Consulting Physician (Nephrology) Toney Reil, MD as Consulting Physician (Gastroenterology) Wenda Overland, LCSW as Social Worker Delles, Jackelyn Poling, RPH-CPP as Pharmacist Earna Coder, MD as Consulting Physician (Internal Medicine)  CHIEF COMPLAINTS/PURPOSE OF CONSULTATION: ANEMIA  HEMATOLOGY HISTORY  # Hx of right Upper lobe lung cancer [[facility]]- no chemo/RT.   # ANEMIA[Hb; MCV-platelets- WBC; Iron sat; ferritin;  GFR- IV CT/US; EGD/colonoscopy-[2022- [facility]]  # CKD- IV[Dr.Korrapati]   Latest Reference Range & Units 08/13/22 14:04  Iron 38 - 169 ug/dL 67  UIBC 409 - 811 ug/dL 914  TIBC 782 - 956 ug/dL 213  Ferritin 30 - 086 ng/mL 28 (L)  Iron Saturation 15 - 55 % 23  Vitamin B12 232 - 1,245 pg/mL 629  Globulin, Total 1.5 - 4.5 g/dL 2.2  WBC 3.4 - 57.8 I69G2/XB 4.3  RBC 4.14 - 5.80 x10E6/uL 3.19 (L)  Hemoglobin 13.0 - 17.7 g/dL 9.7 (L)  HCT 28.4 - 13.2 % 28.8 (L)  MCV 79 - 97 fL 90  MCH 26.6 - 33.0 pg 30.4  MCHC 31.5 - 35.7 g/dL 44.0  RDW 10.2 - 72.5 % 12.5  Platelets 150 - 450 x10E3/uL 200  (L): Data is abnormally low  HISTORY OF PRESENTING ILLNESS: Alone alone.  Ambulating independently.  Nicholas Nolan 72 y.o. 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 He has received IV iron and is receiving retacrit every 2 weeks. Previously hemoglobin had improved to 10 and he did not receive retacrit. He is fatigued but not significantly so. Has black stools at  baseline with oral iron. Denies shortness of breath, cough. Otherwise denies complaints.     Review of Systems  Constitutional:  Positive for malaise/fatigue. Negative for chills, diaphoresis, fever and weight loss.  HENT:  Negative for nosebleeds and sore throat.   Eyes:  Negative for double vision.  Respiratory:  Negative for cough, hemoptysis, sputum production, shortness of breath and wheezing.   Cardiovascular:  Negative for chest pain, palpitations, orthopnea and leg swelling.  Gastrointestinal:  Negative for abdominal pain, blood in stool, constipation, diarrhea, heartburn, melena, nausea and vomiting.  Genitourinary:  Negative for dysuria, frequency and urgency.  Musculoskeletal:  Negative for back pain and joint pain.  Skin: Negative.  Negative for itching and rash.  Neurological:  Positive for tingling. Negative for dizziness, focal weakness, weakness and headaches.  Endo/Heme/Allergies:  Does not bruise/bleed easily.  Psychiatric/Behavioral:  Negative for depression. The patient is not nervous/anxious and does not have insomnia.     MEDICAL HISTORY:  Past Medical History:  Diagnosis Date   Arthritis    hands   Back pain    leg pain   CAD (coronary artery disease)    a. 10/2019 Cor CTA: LM nl, LAD nl, LCX distal dzs w/ FFR 0.65-0.75. RCA nl-->Med Rx.   CKD (chronic kidney disease), stage III (HCC)    Claudication (HCC)    Diabetes mellitus    type 2   Diastolic dysfunction    a. 08/2019 Echo: EF 60-65%, mild LVH, impaired relaxation. Nl RV size/fxn. Nl LA  szie.   History of urinary retention    Hyperlipidemia    Hypertension    a. 05/2019 renal u/s concerning for R RAS; b. 06/2019 Renal artery angio: no more than15% bilat RAS.   Neuropathy    feet   Primary cancer of right upper lobe of lung (HCC) 2014   RUL Lobectomy   Wears dentures    full upper    SURGICAL HISTORY: Past Surgical History:  Procedure Laterality Date   BASAL CELL CARCINOMA EXCISION      COLONOSCOPY WITH PROPOFOL N/A 12/25/2019   Procedure: COLONOSCOPY WITH PROPOFOL;  Surgeon: Toney Reil, MD;  Location: [facility];  Service: Endoscopy;  Laterality: N/A;  Diabetic - injectable and oral meds   COLONOSCOPY WITH PROPOFOL N/A 10/08/2021   Procedure: COLONOSCOPY WITH PROPOFOL;  Surgeon: Wyline Mood, MD;  Location: [facility] ENDOSCOPY;  Service: Gastroenterology;  Laterality: N/A;   LUMBAR LAMINECTOMY/DECOMPRESSION MICRODISCECTOMY  01/10/2012   Procedure: LUMBAR LAMINECTOMY/DECOMPRESSION MICRODISCECTOMY 2 LEVELS;  Surgeon: Hewitt Shorts, MD;  Location: [facility] NEURO ORS;  Service: Neurosurgery;  Laterality: Bilateral;  Lumbar four-Sacral One laminectomies   LUNG SURGERY Right 11/22/12   right upper lobe   POLYPECTOMY N/A 12/25/2019   Procedure: POLYPECTOMY;  Surgeon: Toney Reil, MD;  Location: [facility];  Service: Endoscopy;  Laterality: N/A;   PROSTATE SURGERY     biopsy-due to elelvated PSA   RENAL ANGIOGRAPHY Right 07/02/2019   Procedure: RENAL ANGIOGRAPHY;  Surgeon: Annice Needy, MD;  Location: [facility] INVASIVE CV LAB;  Service: Cardiovascular;  Laterality: Right;   SHOULDER SURGERY     VASECTOMY      SOCIAL HISTORY: Social History   Socioeconomic History   Marital status: Married    Spouse name: Not on file   Number of children: 4   Years of education: Not on file   Highest education level: Associate degree: occupational, Scientist, product/process development, or vocational program  Occupational History   Occupation: retired  Tobacco Use   Smoking status: Former    Current packs/day: 0.00    Average packs/day: 2.0 packs/day for 45.0 years (90.0 ttl pk-yrs)    Types: Cigarettes    Start date: 11/29/1967    Quit date: 11/28/2012    Years since quitting: 10.6   Smokeless tobacco: Current    Types: Snuff  Vaping Use   Vaping status: Never Used  Substance and Sexual Activity   Alcohol use: No   Drug use: No   Sexual activity: Yes    Birth control/protection: None  Other  Topics Concern   Not on file  Social History Narrative   Lives in with wife; daughter; mother [alzheimer]; quit smoking- 2015 [lung cancer dx]; no alcohol; retd- air Psychiatric nurse.    Social Determinants of Health   Financial Resource Strain: Low Risk  (01/12/2023)   Overall Financial Resource Strain (CARDIA)    Difficulty of Paying Living Expenses: Not hard at all  Food Insecurity: No Food Insecurity (02/25/2022)   Hunger Vital Sign    Worried About Running Out of Food in the Last Year: Never true    Ran Out of Food in the Last Year: Never true  Transportation Needs: No Transportation Needs (01/12/2023)   PRAPARE - Administrator, Civil Service (Medical): No    Lack of Transportation (Non-Medical): No  Physical Activity: Inactive (01/12/2023)   Exercise Vital Sign    Days of Exercise per Week: 0 days    Minutes of Exercise per Session: 0  min  Stress: Stress Concern Present (01/12/2023)   [employer] of Occupational Health - Occupational Stress Questionnaire    Feeling of Stress : To some extent  Social Connections: Moderately Isolated (01/12/2023)   Social Connection and Isolation Panel [NHANES]    Frequency of Communication with Friends and Family: More than three times a week    Frequency of Social Gatherings with Friends and Family: More than three times a week    Attends Religious Services: Never    Database administrator or Organizations: No    Attends Banker Meetings: Never    Marital Status: Married  Catering manager Violence: Not At Risk (01/12/2023)   Humiliation, Afraid, Rape, and Kick questionnaire    Fear of Current or Ex-Partner: No    Emotionally Abused: No    Physically Abused: No    Sexually Abused: No    FAMILY HISTORY: Family History  Problem Relation Age of Onset   Cancer Father        prostate   Heart disease Father        MI   Stroke Father    Cancer Brother        liver    ALLERGIES:  is allergic to codeine,  hydromorphone, and morphine and codeine.  MEDICATIONS:  Current Outpatient Medications  Medication Sig Dispense Refill   carvedilol (COREG) 6.25 MG tablet Take 1 tablet (6.25 mg total) by mouth 2 (two) times daily. 180 tablet 1   Dulaglutide (TRULICITY) 1.5 MG/0.5ML SOPN Inject into the skin.     FARXIGA 10 MG TABS tablet Take 10 mg by mouth every morning.     glucose blood (ONETOUCH VERIO) test strip      hydrALAZINE (APRESOLINE) 50 MG tablet Take 1 tablet (50 mg total) by mouth 3 (three) times daily. 180 tablet 1   tamsulosin (FLOMAX) 0.4 MG CAPS capsule Take 1 capsule (0.4 mg total) by mouth daily. 90 capsule 3   traZODone (DESYREL) 50 MG tablet TAKE 0.5-1 TABLETS BY MOUTH AT BEDTIME AS NEEDED FOR SLEEP. 90 tablet 1   TRUEplus Lancets 28G MISC      No current facility-administered medications for this visit.    PHYSICAL EXAMINATION: Vitals:   07/08/23 1328  BP: (!) 139/46  Pulse: 60  Resp: 20  Temp: (!) 96 F (35.6 C)   Filed Weights   07/08/23 1328  Weight: 168 lb (76.2 kg)   Physical Exam Vitals reviewed.  Constitutional:      Appearance: He is not ill-appearing.  HENT:     Head: Normocephalic and atraumatic.  Cardiovascular:     Rate and Rhythm: Normal rate and regular rhythm.  Pulmonary:     Effort: No respiratory distress.     Comments: Decreased breath sounds bilaterally.  Abdominal:     General: There is no distension.     Palpations: Abdomen is soft.  Musculoskeletal:        General: No deformity.  Skin:    General: Skin is warm.     Coloration: Skin is not pale.     Findings: Bruising present.  Neurological:     Mental Status: He is alert and oriented to person, place, and time.  Psychiatric:        Mood and Affect: Mood normal.        Behavior: Behavior normal.     LABORATORY DATA:  I have reviewed the data as listed Lab Results  Component Value Date   WBC 3.7 (L) 07/08/2023  HGB 8.6 (L) 07/08/2023   HCT 27.2 (L) 07/08/2023   MCV 95.8  07/08/2023   PLT 150 07/08/2023   Recent Labs    08/13/22 1404 10/18/22 1320 02/07/23 1311 03/04/23 1231 04/26/23 1407 04/29/23 1242  NA 138   < > 135 137 140 139  K 4.4   < > 4.9 4.6 4.6 5.0  CL 104   < > 110 111 105 107  CO2 20   < > 19* 21* 22 22  GLUCOSE 243*   < > 107* 116* 144* 123*  BUN 49*   < > 75* 68* 56* 50*  CREATININE 2.84*   < > 4.08* 3.17* 3.39* 3.25*  CALCIUM 8.3*   < > 8.4* 8.3* 8.8 8.7*  GFRNONAA  --    < > 15* 20*  --  19*  PROT 6.2  --  6.4*  --  5.6*  --   ALBUMIN 4.0  --  3.7  --  3.5*  --   AST 9  --  11*  --  10  --   ALT 8  --  8  --  4  --   ALKPHOS 85  --  51  --  67  --   BILITOT <0.2  --  0.3  --  <0.2  --    < > = values in this interval not displayed.  Iron/TIBC/Ferritin/ %Sat    Component Value Date/Time   IRON 89 01/17/2023 1247   IRON 67 08/13/2022 1404   TIBC 333 01/17/2023 1247   TIBC 297 08/13/2022 1404   FERRITIN 78 01/17/2023 1247   FERRITIN 28 (L) 08/13/2022 1404   IRONPCTSAT 27 01/17/2023 1247   IRONPCTSAT 23 08/13/2022 1404   No results found.  02/15/23- CT Chest abdomen pelvis wo contrast due to weight loss IMPRESSION: 1. Atheromatous calcifications. 2. Left lung nodule that does not need further follow-up unless patient is high risk. 3. Multinodular thyroid. Outpatient ultrasound correlation recommended. 4. Evidence of chronic pancreatitis. 5. Gallbladder sludge. 6. Bilateral nephrolithiasis without hydronephrosis. 7. Urinary bladder wall thickening which is consistent with hypertrophy or inflammation. 8. Enlarged prostate.   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 Iron studies pending. Plan for venofer today. Continue retacrit 20,000  units every 2 weeks.   # Mild leukopenia- May 2024 white count 3.2 ANC 1.4. Today, wbc 3.7, ANC 2.3. Asymptomatic. Monitor.    # Unintentional weight loss- [facility] 2024- CT scan chest and pelvis noncontrast-negative for any obvious malignancy.   # Multinodular thyroid- incidental on CT for weight loss. Recommend workup with PCP.     # Hypertension- improved. Systolic previously in 160s. Continue home monitoring.     # CKD stage IV- [DM/HTN; Dr.Korrapati]-follow-up- GFR 19- stable.    # DM [Hb A1C-5.3]- trulicity/ SGLT2 inhbitors- stable   # CHF- chronic [Dr.Etang]- stable   # 6 mm Left lung nodule; Prior Hx of Lung cancer Right lung [s/p Lobectomy- 2014? [facility]]- follow up CT scan in October 2024. Ordered today.    # Incidental findings on Imaging CT 2024: Atheromatous calcifications; Evidence of chronic pancreatitis; Gallbladder sludge. Bilateral nephrolithiasis without hydronephrosis; Urinary bladder wall thickening; Enlarged prostate. I reviewed/discussed/counseled the patient.    DISPOSITION:  Venofer today Next week- lab (H&H); +/- retacrit 3 weeks- lab (H&H); +/- retacrit. 5 weeks- lab (H&H); +/- retacrit 7 weeks- lab (H&H); +/- retacrit 9 weeks- lab (cbc, bmp, ferritin, iron studies, ldh), Dr Donneta Romberg, +/- venofer OR retacrit October CT Chest wo contrast- la  No problem-specific Assessment & Plan notes found for this encounter.  All questions were answered. The patient knows to call the clinic with any problems, questions or concerns.  Alinda Dooms, NP 07/08/2023   CC: Alfredia Ferguson, PA

## 2023-07-09 DIAGNOSIS — N186 End stage renal disease: Secondary | ICD-10-CM | POA: Diagnosis not present

## 2023-07-09 DIAGNOSIS — Z992 Dependence on renal dialysis: Secondary | ICD-10-CM | POA: Diagnosis not present

## 2023-07-15 ENCOUNTER — Inpatient Hospital Stay: Payer: Medicare PPO | Attending: Internal Medicine

## 2023-07-15 ENCOUNTER — Inpatient Hospital Stay: Payer: Medicare PPO

## 2023-07-15 VITALS — BP 172/80 | HR 56

## 2023-07-15 DIAGNOSIS — E1122 Type 2 diabetes mellitus with diabetic chronic kidney disease: Secondary | ICD-10-CM | POA: Insufficient documentation

## 2023-07-15 DIAGNOSIS — D631 Anemia in chronic kidney disease: Secondary | ICD-10-CM | POA: Insufficient documentation

## 2023-07-15 DIAGNOSIS — N184 Chronic kidney disease, stage 4 (severe): Secondary | ICD-10-CM | POA: Insufficient documentation

## 2023-07-15 DIAGNOSIS — D649 Anemia, unspecified: Secondary | ICD-10-CM

## 2023-07-15 LAB — HEMOGLOBIN AND HEMATOCRIT, BLOOD
HCT: 28 % — ABNORMAL LOW (ref 39.0–52.0)
Hemoglobin: 8.9 g/dL — ABNORMAL LOW (ref 13.0–17.0)

## 2023-07-15 MED ORDER — EPOETIN ALFA-EPBX 20000 UNIT/ML IJ SOLN
20000.0000 [IU] | Freq: Once | INTRAMUSCULAR | Status: AC
  Administered 2023-07-15: 20000 [IU] via SUBCUTANEOUS
  Filled 2023-07-15: qty 1

## 2023-07-29 ENCOUNTER — Inpatient Hospital Stay: Payer: Medicare PPO

## 2023-07-29 DIAGNOSIS — E1122 Type 2 diabetes mellitus with diabetic chronic kidney disease: Secondary | ICD-10-CM | POA: Diagnosis not present

## 2023-07-29 DIAGNOSIS — D649 Anemia, unspecified: Secondary | ICD-10-CM

## 2023-07-29 DIAGNOSIS — N184 Chronic kidney disease, stage 4 (severe): Secondary | ICD-10-CM | POA: Diagnosis not present

## 2023-07-29 DIAGNOSIS — D631 Anemia in chronic kidney disease: Secondary | ICD-10-CM | POA: Diagnosis not present

## 2023-07-29 LAB — HEMOGLOBIN AND HEMATOCRIT, BLOOD
HCT: 32.1 % — ABNORMAL LOW (ref 39.0–52.0)
Hemoglobin: 10.2 g/dL — ABNORMAL LOW (ref 13.0–17.0)

## 2023-07-29 NOTE — Progress Notes (Signed)
Hgb at 10.2; will hold retacrit today

## 2023-07-30 ENCOUNTER — Other Ambulatory Visit: Payer: Self-pay | Admitting: Physician Assistant

## 2023-07-30 DIAGNOSIS — F322 Major depressive disorder, single episode, severe without psychotic features: Secondary | ICD-10-CM

## 2023-08-01 ENCOUNTER — Other Ambulatory Visit: Payer: Self-pay | Admitting: *Deleted

## 2023-08-01 DIAGNOSIS — E1122 Type 2 diabetes mellitus with diabetic chronic kidney disease: Secondary | ICD-10-CM | POA: Diagnosis not present

## 2023-08-01 DIAGNOSIS — E782 Mixed hyperlipidemia: Secondary | ICD-10-CM | POA: Diagnosis not present

## 2023-08-01 DIAGNOSIS — E1159 Type 2 diabetes mellitus with other circulatory complications: Secondary | ICD-10-CM | POA: Diagnosis not present

## 2023-08-01 DIAGNOSIS — N184 Chronic kidney disease, stage 4 (severe): Secondary | ICD-10-CM | POA: Diagnosis not present

## 2023-08-01 DIAGNOSIS — I10 Essential (primary) hypertension: Secondary | ICD-10-CM | POA: Diagnosis not present

## 2023-08-01 MED ORDER — CARVEDILOL 6.25 MG PO TABS: 6.2500 mg | ORAL_TABLET | Freq: Two times a day (BID) | ORAL | 0 refills | Status: DC

## 2023-08-05 ENCOUNTER — Encounter: Payer: Self-pay | Admitting: Cardiology

## 2023-08-05 ENCOUNTER — Ambulatory Visit: Payer: Medicare PPO | Attending: Cardiology | Admitting: Cardiology

## 2023-08-05 VITALS — BP 138/48 | HR 63 | Ht 69.0 in | Wt 167.4 lb

## 2023-08-05 DIAGNOSIS — I10 Essential (primary) hypertension: Secondary | ICD-10-CM

## 2023-08-05 DIAGNOSIS — I2510 Atherosclerotic heart disease of native coronary artery without angina pectoris: Secondary | ICD-10-CM | POA: Diagnosis not present

## 2023-08-05 MED ORDER — HYDRALAZINE HCL 100 MG PO TABS: 100.0000 mg | ORAL_TABLET | Freq: Two times a day (BID) | ORAL | 0 refills | Status: DC

## 2023-08-05 NOTE — Patient Instructions (Signed)
Medication Instructions:   Your physician recommends that you continue on your current medications as directed. Please refer to the Current Medication list given to you today.  *If you need a refill on your cardiac medications before your next appointment, please call your pharmacy*   Lab Work:  Your physician recommends that you continue on your current medications as directed. Please refer to the Current Medication list given to you today.  If you have labs (blood work) drawn today and your tests are completely normal, you will receive your results only by: MyChart Message (if you have MyChart) OR A paper copy in the mail If you have any lab test that is abnormal or we need to change your treatment, we will call you to review the results.   Testing/Procedures:  None Ordered   Follow-Up: At [facility], you and your health needs are our priority.  As part of our continuing mission to provide you with exceptional heart care, we have created designated Provider Care Teams.  These Care Teams include your primary Cardiologist (physician) and Advanced Practice Providers (APPs -  Physician Assistants and Nurse Practitioners) who all work together to provide you with the care you need, when you need it.  We recommend signing up for the patient portal called "MyChart".  Sign up information is provided on this After Visit Summary.  MyChart is used to connect with patients for Virtual Visits (Telemedicine).  Patients are able to view lab/test results, encounter notes, upcoming appointments, etc.  Non-urgent messages can be sent to your provider as well.   To learn more about what you can do with MyChart, go to [email or web address].    Your next appointment:   12 month(s)  Provider:   You may see Debbe Odea, MD or one of the following Advanced Practice Providers on your designated Care Team:   Nicolasa Ducking, NP Eula Listen, PA-C Cadence Fransico Chatham, PA-C Charlsie Quest, NP

## 2023-08-05 NOTE — Progress Notes (Signed)
Cardiology Office Note:    Date:  08/05/2023   ID:  Nicholas Nolan, DOB April 18, 1951, MRN [id number]  PCP:  Ronnald Ramp, MD  Cardiologist:  Debbe Odea, MD  Electrophysiologist:  None   Referring MD: Alfredia Ferguson, PA-C   Chief Complaint  Patient presents with   Follow-up    Patient has increased Hydralazine to 100 mg TID due to increasing home bp readings.  Has been taking increased does for past week with improved home bp readings.      History of Present Illness:    Nicholas Nolan is a 72 y.o. male with a hx of CAD (CCTA 10/2019-Ca score 1689, mild LAD, LCx disease, FFRct with no sig obstruction), PAD, CKD, hypertension, diabetes, hyperlipidemia, former smoker x40+ years who presents for follow-up.  Blood pressures have been elevated at home, of late.  Systolics in the 180s.  Previously did not tolerate higher doses of hydralazine, was on 50 mg 3 times daily.  He increase hydralazine to 100 mg 3 times daily, blood pressures have been improved with systolics in the 130s.  Also takes Coreg.  Denies chest pain.  Prior notes  Renal ultrasound and renal angiogram were performed 06/2019 to evaluate for possible secondary causes but that was normal  Echo 08/2019 showed normal systolic function, EF 60 to 65%, impaired relaxation, mild LVH. PAD, evaluated by interventional cardiology, angio being deferred for now due to risk of CIN   Past Medical History:  Diagnosis Date   Arthritis    hands   Back pain    leg pain   CAD (coronary artery disease)    a. 10/2019 Cor CTA: LM nl, LAD nl, LCX distal dzs w/ FFR 0.65-0.75. RCA nl-->Med Rx.   CKD (chronic kidney disease), stage III (HCC)    Claudication (HCC)    Diabetes mellitus    type 2   Diastolic dysfunction    a. 08/2019 Echo: EF 60-65%, mild LVH, impaired relaxation. Nl RV size/fxn. Nl LA szie.   History of urinary retention    Hyperlipidemia    Hypertension    a. 05/2019 renal u/s concerning for R RAS; b.  06/2019 Renal artery angio: no more than15% bilat RAS.   Neuropathy    feet   Primary cancer of right upper lobe of lung (HCC) 2014   RUL Lobectomy   Wears dentures    full upper    Past Surgical History:  Procedure Laterality Date   BASAL CELL CARCINOMA EXCISION     COLONOSCOPY WITH PROPOFOL N/A 12/25/2019   Procedure: COLONOSCOPY WITH PROPOFOL;  Surgeon: Toney Reil, MD;  Location: [facility];  Service: Endoscopy;  Laterality: N/A;  Diabetic - injectable and oral meds   COLONOSCOPY WITH PROPOFOL N/A 10/08/2021   Procedure: COLONOSCOPY WITH PROPOFOL;  Surgeon: Wyline Mood, MD;  Location: [facility];  Service: Gastroenterology;  Laterality: N/A;   LUMBAR LAMINECTOMY/DECOMPRESSION MICRODISCECTOMY  01/10/2012   Procedure: LUMBAR LAMINECTOMY/DECOMPRESSION MICRODISCECTOMY 2 LEVELS;  Surgeon: Hewitt Shorts, MD;  Location: [facility]UR[facility];  Service: Neurosurgery;  Laterality: Bilateral;  Lumbar four-Sacral One laminectomies   LUNG SURGERY Right 11/22/12   right upper lobe   POLYPECTOMY N/A 12/25/2019   Procedure: POLYPECTOMY;  Surgeon: Toney Reil, MD;  Location: [facility];  Service: Endoscopy;  Laterality: N/A;   PROSTATE SURGERY     biopsy-due to elelvated PSA   RENAL ANGIOGRAPHY Right 07/02/2019   Procedure: RENAL ANGIOGRAPHY;  Surgeon: Annice Needy, MD;  Location: [facility]  INVASIVE CV LAB;  Service: Cardiovascular;  Laterality: Right;   SHOULDER SURGERY     VASECTOMY      Current Medications: Current Meds  Medication Sig   carvedilol (COREG) 6.25 MG tablet Take 1 tablet (6.25 mg total) by mouth 2 (two) times daily.   Dulaglutide (TRULICITY) 1.5 MG/0.5ML SOPN Inject into the skin.   ezetimibe (ZETIA) 10 MG tablet Take 1 tablet by mouth daily.   FARXIGA 10 MG TABS tablet Take 10 mg by mouth every morning.   glucose blood (ONETOUCH VERIO) test strip    hydrALAZINE (APRESOLINE) 100 MG tablet Take 1 tablet (100 mg total) by mouth 2 (two) times daily.    tamsulosin (FLOMAX) 0.4 MG CAPS capsule Take 1 capsule (0.4 mg total) by mouth daily.   traZODone (DESYREL) 50 MG tablet TAKE 0.5-1 TABLETS BY MOUTH AT BEDTIME AS NEEDED FOR SLEEP.   TRUEplus Lancets 28G MISC    [DISCONTINUED] hydrALAZINE (APRESOLINE) 50 MG tablet Take 100 mg by mouth 3 (three) times daily.     Allergies:   Codeine, Hydromorphone, and Morphine and codeine   Social History   Socioeconomic History   Marital status: Married    Spouse name: Not on file   Number of children: 4   Years of education: Not on file   Highest education level: Associate degree: occupational, Scientist, product/process development, or vocational program  Occupational History   Occupation: retired  Tobacco Use   Smoking status: Former    Current packs/day: 0.00    Average packs/day: 2.0 packs/day for 45.0 years (90.0 ttl pk-yrs)    Types: Cigarettes    Start date: 11/29/1967    Quit date: 11/28/2012    Years since quitting: 10.6   Smokeless tobacco: Current    Types: Snuff  Vaping Use   Vaping status: Never Used  Substance and Sexual Activity   Alcohol use: No   Drug use: No   Sexual activity: Yes    Birth control/protection: None  Other Topics Concern   Not on file  Social History Narrative   Lives in with wife; daughter; mother [alzheimer]; quit smoking- 2015 [lung cancer dx]; no alcohol; retd- air Psychiatric nurse.    Social Determinants of Health   Financial Resource Strain: Low Risk  (01/12/2023)   Overall Financial Resource Strain (CARDIA)    Difficulty of Paying Living Expenses: Not hard at all  Food Insecurity: No Food Insecurity (02/25/2022)   Hunger Vital Sign    Worried About Running Out of Food in the Last Year: Never true    Ran Out of Food in the Last Year: Never true  Transportation Needs: No Transportation Needs (01/12/2023)   PRAPARE - Administrator, Civil Service (Medical): No    Lack of Transportation (Non-Medical): No  Physical Activity: Inactive (01/12/2023)   Exercise Vital Sign     Days of Exercise per Week: 0 days    Minutes of Exercise per Session: 0 min  Stress: Stress Concern Present (01/12/2023)   [employer] of Occupational Health - Occupational Stress Questionnaire    Feeling of Stress : To some extent  Social Connections: Moderately Isolated (01/12/2023)   Social Connection and Isolation Panel [NHANES]    Frequency of Communication with Friends and Family: More than three times a week    Frequency of Social Gatherings with Friends and Family: More than three times a week    Attends Religious Services: Never    Database administrator or Organizations: No  Attends Banker Meetings: Never    Marital Status: Married     Family History: The patient's family history includes Cancer in his brother and father; Heart disease in his father; Stroke in his father.  ROS:   Please see the history of present illness.     All other systems reviewed and are negative.  EKGs/Labs/Other Studies Reviewed:    The following studies were reviewed today:   EKG Interpretation Date/Time:  Friday August 05 2023 13:38:14 EDT Ventricular Rate:  63 PR Interval:  128 QRS Duration:  98 QT Interval:  462 QTC Calculation: 472 R Axis:   57  Text Interpretation: Normal sinus rhythm Normal ECG Confirmed by Debbe Odea ([id number]) on 08/05/2023 1:43:38 PM    Recent Labs: 07/08/2023: ALT 9; BUN 41; Creatinine 3.15; Platelets 150; Potassium 4.5; Sodium 139 07/29/2023: Hemoglobin 10.2  Recent Lipid Panel    Component Value Date/Time   CHOL 126 08/21/2020 1246   CHOL 221 (H) 10/19/2019 1046   TRIG 89 08/21/2020 1246   HDL 48 08/21/2020 1246   HDL 43 10/19/2019 1046   CHOLHDL 2.6 08/21/2020 1246   VLDL 18 08/21/2020 1246   LDLCALC 60 08/21/2020 1246   LDLCALC 150 (H) 10/19/2019 1046    Physical Exam:    VS:  BP (!) 138/48 (BP Location: Left Arm, Patient Position: Sitting, Cuff Size: Normal)   Pulse 63   Ht 5\' 9"  (1.753 m)   Wt 167 lb 6.4 oz  (75.9 kg)   SpO2 97%   BMI 24.72 kg/m     Wt Readings from Last 3 Encounters:  08/05/23 167 lb 6.4 oz (75.9 kg)  07/08/23 168 lb (76.2 kg)  05/20/23 169 lb (76.7 kg)     GEN:  Well nourished, well developed in no acute distress HEENT: Normal NECK: No JVD; bilateral carotid bruit noted worse on the right. CARDIAC: RRR, no murmurs, rubs, gallops RESPIRATORY:  Clear to auscultation without rales, wheezing or rhonchi  ABDOMEN: Soft, non-tender, non-distended MUSCULOSKELETAL:  No edema; No deformity, extremities appear warm, DP difficult to palpate SKIN: Warm and dry NEUROLOGIC:  Alert and oriented x 3 PSYCHIATRIC:  Normal affect  ASSESSMENT:    1. Primary hypertension   2. Coronary artery disease involving native coronary artery of native heart without angina pectoris    PLAN:    In order of problems listed above:  Hypertension, BP reasonably controlled.  Systolic 138 diastolic 48, continue hydralazine 100 mg 3 times daily, Coreg 6.25 mg twice daily.  Avoid further up titration of BP meds due to low diastolic. nonobstructive CAD in LAD and RCA(Ca2+ 1698, FFR CT no stenosis).  Denies chest pain.  LDL at goal.  Continue aspirin, Lipitor, carvedilol.  Follow-up yearly.  Medication Adjustments/Labs and Tests Ordered: Current medicines are reviewed at length with the patient today.  Concerns regarding medicines are outlined above.  Orders Placed This Encounter  Procedures   EKG 12-Lead   Meds ordered this encounter  Medications   hydrALAZINE (APRESOLINE) 100 MG tablet    Sig: Take 1 tablet (100 mg total) by mouth 2 (two) times daily.    Dispense:  270 tablet    Refill:  0    Patient Instructions  Medication Instructions:   Your physician recommends that you continue on your current medications as directed. Please refer to the Current Medication list given to you today.  *If you need a refill on your cardiac medications before your next appointment, please call your  pharmacy*  Lab Work:  Your physician recommends that you continue on your current medications as directed. Please refer to the Current Medication list given to you today.  If you have labs (blood work) drawn today and your tests are completely normal, you will receive your results only by: MyChart Message (if you have MyChart) OR A paper copy in the mail If you have any lab test that is abnormal or we need to change your treatment, we will call you to review the results.   Testing/Procedures:  None Ordered   Follow-Up: At [facility], you and your health needs are our priority.  As part of our continuing mission to provide you with exceptional heart care, we have created designated Provider Care Teams.  These Care Teams include your primary Cardiologist (physician) and Advanced Practice Providers (APPs -  Physician Assistants and Nurse Practitioners) who all work together to provide you with the care you need, when you need it.  We recommend signing up for the patient portal called "MyChart".  Sign up information is provided on this After Visit Summary.  MyChart is used to connect with patients for Virtual Visits (Telemedicine).  Patients are able to view lab/test results, encounter notes, upcoming appointments, etc.  Non-urgent messages can be sent to your provider as well.   To learn more about what you can do with MyChart, go to [email or web address].    Your next appointment:   12 month(s)  Provider:   You may see Debbe Odea, MD or one of the following Advanced Practice Providers on your designated Care Team:   Nicolasa Ducking, NP Eula Listen, PA-C Cadence Fransico Audric, PA-C Charlsie Quest, NP   Signed, Debbe Odea, MD  08/05/2023 2:23 PM    [facility]

## 2023-08-08 DIAGNOSIS — N186 End stage renal disease: Secondary | ICD-10-CM | POA: Diagnosis not present

## 2023-08-08 DIAGNOSIS — Z992 Dependence on renal dialysis: Secondary | ICD-10-CM | POA: Diagnosis not present

## 2023-08-12 ENCOUNTER — Telehealth: Payer: Self-pay | Admitting: Family Medicine

## 2023-08-12 ENCOUNTER — Inpatient Hospital Stay: Payer: Medicare PPO | Attending: Internal Medicine

## 2023-08-12 ENCOUNTER — Inpatient Hospital Stay: Payer: Medicare PPO

## 2023-08-12 DIAGNOSIS — D631 Anemia in chronic kidney disease: Secondary | ICD-10-CM | POA: Diagnosis not present

## 2023-08-12 DIAGNOSIS — I129 Hypertensive chronic kidney disease with stage 1 through stage 4 chronic kidney disease, or unspecified chronic kidney disease: Secondary | ICD-10-CM | POA: Diagnosis not present

## 2023-08-12 DIAGNOSIS — E1122 Type 2 diabetes mellitus with diabetic chronic kidney disease: Secondary | ICD-10-CM | POA: Insufficient documentation

## 2023-08-12 DIAGNOSIS — N184 Chronic kidney disease, stage 4 (severe): Secondary | ICD-10-CM | POA: Insufficient documentation

## 2023-08-12 DIAGNOSIS — D649 Anemia, unspecified: Secondary | ICD-10-CM

## 2023-08-12 LAB — HEMOGLOBIN AND HEMATOCRIT, BLOOD
HCT: 32.7 % — ABNORMAL LOW (ref 39.0–52.0)
Hemoglobin: 10.6 g/dL — ABNORMAL LOW (ref 13.0–17.0)

## 2023-08-12 NOTE — Telephone Encounter (Signed)
Medication Refill - Medication: sertraline (ZOLOFT) 50 MG tablet [[id number]]  DISCONTINUED   Has the patient contacted their pharmacy? Yes.   (Agent: If no, request that the patient contact the pharmacy for the refill. If patient does not wish to contact the pharmacy document the reason why and proceed with request.) (Agent: If yes, when and what did the pharmacy advise?)  Preferred Pharmacy (with phone number or street name):  [id number] - [place], [place] - [address] Phone: [phone]  Fax: [phone]     Has the patient been seen for an appointment in the last year OR does the patient have an upcoming appointment? Yes.    Agent: Please be advised that RX refills may take up to 3 business days. We ask that you follow-up with your pharmacy.

## 2023-08-12 NOTE — Progress Notes (Signed)
HbG is 10.6. hold retacrit.

## 2023-08-15 MED ORDER — SERTRALINE HCL 50 MG PO TABS: 50.0000 mg | ORAL_TABLET | Freq: Every day | ORAL | 3 refills | Status: DC

## 2023-08-15 NOTE — Telephone Encounter (Signed)
[name] 50mg  daily sent to pharmacy. Patient will need appointment for chronic follow up with new PCP and med management in Nov or Dec 2024   Ronnald Ramp, MD  [facility]

## 2023-08-19 ENCOUNTER — Ambulatory Visit: Payer: Medicare PPO

## 2023-08-22 ENCOUNTER — Telehealth: Payer: Self-pay

## 2023-08-22 NOTE — Telephone Encounter (Signed)
Fyi  Copied from CRM [id number]. Topic: General - Other >> Aug 22, 2023  9:48 AM Dondra Prader A wrote: Reason for CRM: FYI: Pt wife called in to schedule him an appointment. Pt has been sick for two weeks with a chronic cough and running a fever off and own. Pt did not want to be seen today or tomorrow. Pt scheduled an appt for Wed. 08/24/2023 to see his PCP.

## 2023-08-23 ENCOUNTER — Other Ambulatory Visit: Payer: Self-pay

## 2023-08-23 ENCOUNTER — Emergency Department
Admission: EM | Admit: 2023-08-23 | Discharge: 2023-08-23 | Disposition: A | Discharge status: Home / Self Care | Payer: Medicare PPO | Attending: Emergency Medicine | Admitting: Emergency Medicine

## 2023-08-23 ENCOUNTER — Telehealth: Payer: Self-pay

## 2023-08-23 ENCOUNTER — Encounter: Payer: Self-pay | Admitting: Emergency Medicine

## 2023-08-23 DIAGNOSIS — R339 Retention of urine, unspecified: Secondary | ICD-10-CM | POA: Diagnosis not present

## 2023-08-23 DIAGNOSIS — N401 Enlarged prostate with lower urinary tract symptoms: Secondary | ICD-10-CM

## 2023-08-23 LAB — URINALYSIS, ROUTINE W REFLEX MICROSCOPIC
Bilirubin Urine: NEGATIVE
Glucose, UA: >=500 mg/dL — AB
Hgb urine dipstick: NEGATIVE
Ketones, ur: NEGATIVE mg/dL
Leukocytes,Ua: NEGATIVE
Nitrite: NEGATIVE
Protein, ur: 100 mg/dL — AB
Specific Gravity, Urine: 1.005 (ref 1.005–1.030)
Squamous Epithelial / HPF: 0 /[HPF] (ref 0–5)
WBC, UA: 0 WBC/hpf (ref 0–5)
pH: 7 (ref 5.0–8.0)

## 2023-08-23 NOTE — Discharge Instructions (Signed)
Please seek medical attention for any high fevers, chest pain, shortness of breath, change in behavior, persistent vomiting, bloody stool or any other new or concerning symptoms.  

## 2023-08-23 NOTE — Telephone Encounter (Signed)
Patient called stating he went to ER due to urinary retention and had catheter placed. He has follow up for voiding trial with Sam on 08/30/23. Patient wanted to ask Dr Lonna Cobb if he should go ahead and get back on Finasteride medication in addition to Tamsulosin? Advised patient that provider is out of the office for a few days and may not be able to respond until next week and that this may be discussed during his follow up visit with Sam instead. Please advise.

## 2023-08-23 NOTE — ED Triage Notes (Signed)
Patient ambulatory to triage with steady gait, without difficulty or distress noted; pt reports unable to void since 8pm ; st hx urinary retention

## 2023-08-23 NOTE — ED Notes (Signed)
Converted foley to leg bag.

## 2023-08-23 NOTE — ED Provider Notes (Signed)
[facility] Provider Note    Event Date/Time   First MD Initiated Contact with Patient 08/23/23 0703     (approximate)   History   Urinary Retention   HPI  Nicholas Nolan is a 72 y.o. male who presents to the emergency department today because of concerns for urinary retention.  Patient states this has happened to him before and he has required Foley catheter few times in the past.  Started having difficulty urinating last night.  By the time my exam the patient already had a Foley catheter placed and states he feels much better.  Additionally the patient had been having issues with cold like symptoms for a couple of weeks.  States that he is seeing his doctor about this tomorrow.     Physical Exam   Triage Vital Signs: ED Triage Vitals  Encounter Vitals Group     BP 08/23/23 0647 (!) 177/112     Systolic BP Percentile --      Diastolic BP Percentile --      Pulse Rate 08/23/23 0647 84     Resp 08/23/23 0647 20     Temp 08/23/23 0647 98.1 F (36.7 C)     Temp Source 08/23/23 0647 Oral     SpO2 08/23/23 0647 98 %     Weight --      Height --      Head Circumference --      Peak Flow --      Pain Score 08/23/23 0645 10     Pain Loc --      Pain Education --      Exclude from Growth Chart --     Most recent vital signs: Vitals:   08/23/23 0647  BP: (!) 177/112  Pulse: 84  Resp: 20  Temp: 98.1 F (36.7 C)  SpO2: 98%   General: Awake, alert, oriented. CV:  Good peripheral perfusion. Regular rate and rhythm. Resp:  Normal effort. Lungs clear. Abd:  No distention.  Other:  Foley catheter in place.   ED Results / Procedures / Treatments   Labs (all labs ordered are listed, but only abnormal results are displayed) Labs Reviewed  URINALYSIS, ROUTINE W REFLEX MICROSCOPIC     EKG  None   RADIOLOGY None  PROCEDURES:  Critical Care performed: No    MEDICATIONS ORDERED IN ED: Medications - No data to  display   IMPRESSION / MDM / ASSESSMENT AND PLAN / ED COURSE  I reviewed the triage vital signs and the nursing notes.                              Differential diagnosis includes, but is not limited to, UTI, prostate issues  Patient's presentation is most consistent with acute presentation with potential threat to life or bodily function.  Patient presents to the emergency department today because of concerns for urinary retention.  This is occurred to the patient in the past.  States he has a history of prostate issues.  Patient felt better after Foley catheter was placed.  Will check UA for signs of infection.  Also offered to obtain chest x-ray and COVID swab for patient given cold-like symptoms however he declined at this time.  UA without concerning abnormality. Will plan on discharging to follow up with urology.     FINAL CLINICAL IMPRESSION(S) / ED DIAGNOSES   Final diagnoses:  Urinary retention  Note:  This document was prepared using Dragon voice recognition software and may include unintentional dictation errors.    Phineas Semen, MD 08/23/23 [phone]

## 2023-08-23 NOTE — Progress Notes (Unsigned)
Established patient visit   Patient: Nicholas Nolan   DOB: January 18, 1951   72 y.o. Male  MRN: [id number] Visit Date: 08/24/2023  Today's healthcare provider: Ronnald Ramp, MD   No chief complaint on file.  Subjective       Discussed the use of AI scribe software for clinical note transcription with the patient, who gave verbal consent to proceed.  History of Present Illness             Past Medical History:  Diagnosis Date   Arthritis    hands   Back pain    leg pain   CAD (coronary artery disease)    a. 10/2019 Cor CTA: LM nl, LAD nl, LCX distal dzs w/ FFR 0.65-0.75. RCA nl-->Med Rx.   CKD (chronic kidney disease), stage III (HCC)    Claudication (HCC)    Diabetes mellitus    type 2   Diastolic dysfunction    a. 08/2019 Echo: EF 60-65%, mild LVH, impaired relaxation. Nl RV size/fxn. Nl LA szie.   History of urinary retention    Hyperlipidemia    Hypertension    a. 05/2019 renal u/s concerning for R RAS; b. 06/2019 Renal artery angio: no more than15% bilat RAS.   Neuropathy    feet   Primary cancer of right upper lobe of lung (HCC) 2014   RUL Lobectomy   Wears dentures    full upper    Medications: Outpatient Medications Prior to Visit  Medication Sig   carvedilol (COREG) 6.25 MG tablet Take 1 tablet (6.25 mg total) by mouth 2 (two) times daily.   Dulaglutide (TRULICITY) 1.5 MG/0.5ML SOPN Inject into the skin.   ezetimibe (ZETIA) 10 MG tablet Take 1 tablet by mouth daily.   FARXIGA 10 MG TABS tablet Take 10 mg by mouth every morning.   glucose blood (ONETOUCH VERIO) test strip    hydrALAZINE (APRESOLINE) 100 MG tablet Take 1 tablet (100 mg total) by mouth 2 (two) times daily.   sertraline (ZOLOFT) 50 MG tablet Take 1 tablet (50 mg total) by mouth daily.   tamsulosin (FLOMAX) 0.4 MG CAPS capsule Take 1 capsule (0.4 mg total) by mouth daily.   traZODone (DESYREL) 50 MG tablet TAKE 0.5-1 TABLETS BY MOUTH AT BEDTIME AS NEEDED FOR SLEEP.    TRUEplus Lancets 28G MISC    No facility-administered medications prior to visit.    Review of Systems  {Insert previous labs (optional):23779} {See past labs  Heme  Chem  Endocrine  Serology  Results Review (optional):1}   Objective    There were no vitals taken for this visit. {Insert last BP/Wt (optional):23777}{See vitals history (optional):1}   Physical Exam  ***  No results found for any visits on 08/24/23.  Assessment & Plan     Problem List Items Addressed This Visit   None   Assessment and Plan              No follow-ups on file.         Ronnald Ramp, MD  [facility] [phone] (phone) [phone] (fax)  [facility]

## 2023-08-23 NOTE — Telephone Encounter (Signed)
He needs to be back on Flomax. Would recommend resuming finasteride as well unless he's interested in pursuing surgery, in which case he can stay on Flomax only.

## 2023-08-24 ENCOUNTER — Encounter: Payer: Self-pay | Admitting: Family Medicine

## 2023-08-24 ENCOUNTER — Ambulatory Visit: Payer: Medicare PPO | Admitting: Family Medicine

## 2023-08-24 VITALS — BP 163/65 | HR 70 | Temp 97.8°F | Ht 70.8 in | Wt 157.1 lb

## 2023-08-24 DIAGNOSIS — J189 Pneumonia, unspecified organism: Secondary | ICD-10-CM

## 2023-08-24 DIAGNOSIS — R5081 Fever presenting with conditions classified elsewhere: Secondary | ICD-10-CM

## 2023-08-24 DIAGNOSIS — G2581 Restless legs syndrome: Secondary | ICD-10-CM

## 2023-08-24 DIAGNOSIS — R058 Other specified cough: Secondary | ICD-10-CM | POA: Insufficient documentation

## 2023-08-24 DIAGNOSIS — R051 Acute cough: Secondary | ICD-10-CM

## 2023-08-24 DIAGNOSIS — R509 Fever, unspecified: Secondary | ICD-10-CM | POA: Insufficient documentation

## 2023-08-24 MED ORDER — AZITHROMYCIN 250 MG PO TABS
ORAL_TABLET | ORAL | 0 refills | Status: AC
Start: 2023-08-24 — End: 2023-08-29

## 2023-08-24 MED ORDER — FINASTERIDE 5 MG PO TABS: 5.0000 mg | ORAL_TABLET | Freq: Every day | ORAL | 3 refills | Status: DC

## 2023-08-24 MED ORDER — ROPINIROLE HCL 0.5 MG PO TABS: 0.5000 mg | ORAL_TABLET | Freq: Every day | ORAL | 1 refills | Status: DC

## 2023-08-24 NOTE — Telephone Encounter (Signed)
Pt states he will like to try the finasteride again,

## 2023-08-24 NOTE — Assessment & Plan Note (Signed)
Acute, CAP Persistent cough with yellow phlegm, nasal congestion, and history of low-grade fever. No current fever, no sore throat, no ear pain, and no hemoptysis. History of lung cancer with right lung resection 11 years ago. -Start Azithromycin 500mg  on day 1, followed by 250mg  for days 2-5. -Order chest x-ray to establish baseline. -Advise patient to return if symptoms do not improve after 3 days or if experiencing shortness of breath, lightheadedness, or dizziness.

## 2023-08-24 NOTE — Patient Instructions (Signed)
VISIT SUMMARY:  During your visit, we discussed your ongoing symptoms of nasal congestion, productive cough with yellow phlegm, and shortness of breath, as well as your restless leg syndrome. We also reviewed your history of lung cancer, which was treated eleven years ago.  YOUR PLAN:  -UPPER RESPIRATORY INFECTION: You have been diagnosed with an upper respiratory infection, which is an infection of your upper respiratory tract including your nose, throat, and bronchi. We will start you on a course of Azithromycin, an antibiotic, to help clear the infection. We will also order a chest x-ray to establish a baseline for your lung health. If your symptoms do not improve after 3 days or if you experience shortness of breath, lightheadedness, or dizziness, please return for further evaluation.  Please report to [facility] located at:  [address]  [place], [place] [address]  You do not need an appointment to have xrays completed.   Our office will follow up with  results once available.    -RESTLESS LEG SYNDROME: Your restless leg syndrome, a condition that causes an uncontrollable urge to move your legs, particularly at night, will be managed with a medication called Requip, which should be taken at bedtime. We will also order blood tests to check for anemia and vitamin deficiencies, which can sometimes contribute to restless leg syndrome.  INSTRUCTIONS:  Please start taking Azithromycin as prescribed and Requip at bedtime. Also, please schedule a follow-up appointment for a chest x-ray and blood tests. If your symptoms do not improve or if they worsen, please return for further evaluation.

## 2023-08-24 NOTE — Assessment & Plan Note (Signed)
Chronic symptoms of tingling and restlessness in legs, particularly at night, leading to sleep disturbance. No improvement with magnesium supplementation. -Start Requip  0.5mg  at bedtime. -Order blood tests to check for anemia and vitamin deficiencies at follow up if no improvement of symptoms of requip

## 2023-08-24 NOTE — Addendum Note (Signed)
Addended byRanda Lynn on: 08/24/2023 04:00 PM   Modules accepted: Orders

## 2023-08-26 ENCOUNTER — Inpatient Hospital Stay: Payer: Medicare PPO

## 2023-08-29 ENCOUNTER — Telehealth: Payer: Self-pay

## 2023-08-29 DIAGNOSIS — K529 Noninfective gastroenteritis and colitis, unspecified: Secondary | ICD-10-CM | POA: Diagnosis not present

## 2023-08-29 DIAGNOSIS — R809 Proteinuria, unspecified: Secondary | ICD-10-CM | POA: Diagnosis not present

## 2023-08-29 DIAGNOSIS — N200 Calculus of kidney: Secondary | ICD-10-CM | POA: Diagnosis not present

## 2023-08-29 DIAGNOSIS — N2581 Secondary hyperparathyroidism of renal origin: Secondary | ICD-10-CM | POA: Diagnosis not present

## 2023-08-29 DIAGNOSIS — E875 Hyperkalemia: Secondary | ICD-10-CM | POA: Diagnosis not present

## 2023-08-29 DIAGNOSIS — I10 Essential (primary) hypertension: Secondary | ICD-10-CM | POA: Diagnosis not present

## 2023-08-29 DIAGNOSIS — N400 Enlarged prostate without lower urinary tract symptoms: Secondary | ICD-10-CM | POA: Diagnosis not present

## 2023-08-29 DIAGNOSIS — E1122 Type 2 diabetes mellitus with diabetic chronic kidney disease: Secondary | ICD-10-CM | POA: Diagnosis not present

## 2023-08-29 DIAGNOSIS — N184 Chronic kidney disease, stage 4 (severe): Secondary | ICD-10-CM | POA: Diagnosis not present

## 2023-08-29 NOTE — Telephone Encounter (Signed)
Per [place] Ana patient stated catheter started leaking yesterday, so he took it out, says he's urinating fine. He had voiding trial scheduled w/Sam tomorrow. I spoke with Sam and was advised patient would need to keep his follow up appointment tomorrow 10/22 at 9:15 am/  I called patient and left a detailed message with this information.

## 2023-08-30 ENCOUNTER — Ambulatory Visit: Payer: Medicare PPO | Admitting: Physician Assistant

## 2023-09-01 DIAGNOSIS — I10 Essential (primary) hypertension: Secondary | ICD-10-CM | POA: Diagnosis not present

## 2023-09-01 DIAGNOSIS — D631 Anemia in chronic kidney disease: Secondary | ICD-10-CM | POA: Diagnosis not present

## 2023-09-01 DIAGNOSIS — E1122 Type 2 diabetes mellitus with diabetic chronic kidney disease: Secondary | ICD-10-CM | POA: Diagnosis not present

## 2023-09-01 DIAGNOSIS — N400 Enlarged prostate without lower urinary tract symptoms: Secondary | ICD-10-CM | POA: Diagnosis not present

## 2023-09-01 DIAGNOSIS — N184 Chronic kidney disease, stage 4 (severe): Secondary | ICD-10-CM | POA: Diagnosis not present

## 2023-09-01 DIAGNOSIS — N2581 Secondary hyperparathyroidism of renal origin: Secondary | ICD-10-CM | POA: Diagnosis not present

## 2023-09-01 DIAGNOSIS — E875 Hyperkalemia: Secondary | ICD-10-CM | POA: Diagnosis not present

## 2023-09-01 DIAGNOSIS — R809 Proteinuria, unspecified: Secondary | ICD-10-CM | POA: Diagnosis not present

## 2023-09-01 DIAGNOSIS — K529 Noninfective gastroenteritis and colitis, unspecified: Secondary | ICD-10-CM | POA: Diagnosis not present

## 2023-09-02 ENCOUNTER — Inpatient Hospital Stay: Payer: Medicare PPO

## 2023-09-02 VITALS — BP 165/62 | HR 62

## 2023-09-02 DIAGNOSIS — N184 Chronic kidney disease, stage 4 (severe): Secondary | ICD-10-CM | POA: Diagnosis not present

## 2023-09-02 DIAGNOSIS — I129 Hypertensive chronic kidney disease with stage 1 through stage 4 chronic kidney disease, or unspecified chronic kidney disease: Secondary | ICD-10-CM | POA: Diagnosis not present

## 2023-09-02 DIAGNOSIS — D649 Anemia, unspecified: Secondary | ICD-10-CM

## 2023-09-02 DIAGNOSIS — E1122 Type 2 diabetes mellitus with diabetic chronic kidney disease: Secondary | ICD-10-CM | POA: Diagnosis not present

## 2023-09-02 DIAGNOSIS — D631 Anemia in chronic kidney disease: Secondary | ICD-10-CM | POA: Diagnosis not present

## 2023-09-02 LAB — HEMOGLOBIN AND HEMATOCRIT, BLOOD
HCT: 23.1 % — ABNORMAL LOW (ref 39.0–52.0)
Hemoglobin: 7.4 g/dL — ABNORMAL LOW (ref 13.0–17.0)

## 2023-09-02 MED ORDER — EPOETIN ALFA-EPBX 20000 UNIT/ML IJ SOLN
20000.0000 [IU] | Freq: Once | INTRAMUSCULAR | Status: AC
  Administered 2023-09-02: 20000 [IU] via SUBCUTANEOUS
  Filled 2023-09-02: qty 1

## 2023-09-08 ENCOUNTER — Other Ambulatory Visit: Payer: Self-pay | Admitting: *Deleted

## 2023-09-08 DIAGNOSIS — D649 Anemia, unspecified: Secondary | ICD-10-CM

## 2023-09-09 ENCOUNTER — Other Ambulatory Visit: Payer: Self-pay | Admitting: Family Medicine

## 2023-09-09 ENCOUNTER — Inpatient Hospital Stay: Payer: Medicare PPO | Attending: Internal Medicine

## 2023-09-09 ENCOUNTER — Inpatient Hospital Stay (HOSPITAL_BASED_OUTPATIENT_CLINIC_OR_DEPARTMENT_OTHER): Payer: Medicare PPO | Admitting: Nurse Practitioner

## 2023-09-09 ENCOUNTER — Other Ambulatory Visit: Payer: Self-pay

## 2023-09-09 ENCOUNTER — Encounter: Payer: Self-pay | Admitting: Nurse Practitioner

## 2023-09-09 ENCOUNTER — Inpatient Hospital Stay: Payer: Medicare PPO

## 2023-09-09 ENCOUNTER — Encounter: Payer: Self-pay | Admitting: Family Medicine

## 2023-09-09 VITALS — BP 165/57 | HR 59 | Temp 96.2°F | Ht 70.8 in | Wt 163.0 lb

## 2023-09-09 DIAGNOSIS — E1122 Type 2 diabetes mellitus with diabetic chronic kidney disease: Secondary | ICD-10-CM | POA: Diagnosis not present

## 2023-09-09 DIAGNOSIS — D649 Anemia, unspecified: Secondary | ICD-10-CM

## 2023-09-09 DIAGNOSIS — I130 Hypertensive heart and chronic kidney disease with heart failure and stage 1 through stage 4 chronic kidney disease, or unspecified chronic kidney disease: Secondary | ICD-10-CM | POA: Diagnosis not present

## 2023-09-09 DIAGNOSIS — R911 Solitary pulmonary nodule: Secondary | ICD-10-CM | POA: Insufficient documentation

## 2023-09-09 DIAGNOSIS — D509 Iron deficiency anemia, unspecified: Secondary | ICD-10-CM | POA: Diagnosis not present

## 2023-09-09 DIAGNOSIS — D631 Anemia in chronic kidney disease: Secondary | ICD-10-CM

## 2023-09-09 DIAGNOSIS — N184 Chronic kidney disease, stage 4 (severe): Secondary | ICD-10-CM

## 2023-09-09 DIAGNOSIS — E042 Nontoxic multinodular goiter: Secondary | ICD-10-CM

## 2023-09-09 LAB — CMP (CANCER CENTER ONLY)
ALT: 8 U/L (ref 0–44)
AST: 14 U/L — ABNORMAL LOW (ref 15–41)
Albumin: 3.3 g/dL — ABNORMAL LOW (ref 3.5–5.0)
Alkaline Phosphatase: 66 U/L (ref 38–126)
Anion gap: 8 (ref 5–15)
BUN: 45 mg/dL — ABNORMAL HIGH (ref 8–23)
CO2: 24 mmol/L (ref 22–32)
Calcium: 8.3 mg/dL — ABNORMAL LOW (ref 8.9–10.3)
Chloride: 106 mmol/L (ref 98–111)
Creatinine: 3.2 mg/dL — ABNORMAL HIGH (ref 0.61–1.24)
GFR, Estimated: 20 mL/min — ABNORMAL LOW (ref >60)
Glucose, Bld: 131 mg/dL — ABNORMAL HIGH (ref 70–99)
Potassium: 4.2 mmol/L (ref 3.5–5.1)
Sodium: 138 mmol/L (ref 135–145)
Total Bilirubin: 0.3 mg/dL (ref 0.3–1.2)
Total Protein: 6 g/dL — ABNORMAL LOW (ref 6.5–8.1)

## 2023-09-09 LAB — CBC WITH DIFFERENTIAL (CANCER CENTER ONLY)
Abs Immature Granulocytes: 0.01 10*3/uL (ref 0.00–0.07)
Basophils Absolute: 0.1 10*3/uL (ref 0.0–0.1)
Basophils Relative: 2 %
Eosinophils Absolute: 0.2 10*3/uL (ref 0.0–0.5)
Eosinophils Relative: 6 %
HCT: 24.6 % — ABNORMAL LOW (ref 39.0–52.0)
Hemoglobin: 7.8 g/dL — ABNORMAL LOW (ref 13.0–17.0)
Immature Granulocytes: 0 %
Lymphocytes Relative: 35 %
Lymphs Abs: 1.3 10*3/uL (ref 0.7–4.0)
MCH: 31.1 pg (ref 26.0–34.0)
MCHC: 31.7 g/dL (ref 30.0–36.0)
MCV: 98 fL (ref 80.0–100.0)
Monocytes Absolute: 0.3 10*3/uL (ref 0.1–1.0)
Monocytes Relative: 7 %
Neutro Abs: 1.9 10*3/uL (ref 1.7–7.7)
Neutrophils Relative %: 50 %
Platelet Count: 213 10*3/uL (ref 150–400)
RBC: 2.51 MIL/uL — ABNORMAL LOW (ref 4.22–5.81)
RDW: 15.8 % — ABNORMAL HIGH (ref 11.5–15.5)
WBC Count: 3.8 10*3/uL — ABNORMAL LOW (ref 4.0–10.5)
nRBC: 0 % (ref 0.0–0.2)

## 2023-09-09 LAB — IRON AND TIBC
Iron: 60 ug/dL (ref 45–182)
Saturation Ratios: 21 % (ref 17.9–39.5)
TIBC: 290 ug/dL (ref 250–450)
UIBC: 230 ug/dL

## 2023-09-09 LAB — LACTATE DEHYDROGENASE: LDH: 130 U/L (ref 98–192)

## 2023-09-09 LAB — FERRITIN: Ferritin: 69 ng/mL (ref 24–336)

## 2023-09-09 MED ORDER — IRON SUCROSE 20 MG/ML IV SOLN
200.0000 mg | Freq: Once | INTRAVENOUS | Status: AC
  Administered 2023-09-09: 200 mg via INTRAVENOUS
  Filled 2023-09-09: qty 10

## 2023-09-09 NOTE — Progress Notes (Signed)
[facility] CONSULT NOTE  Patient Care Team: Ronnald Ramp, MD as PCP - General (Family Medicine) Debbe Odea, MD as PCP - Cardiology (Cardiology) Isla Pence, OD (Optometry) Solum, Marlana Salvage, MD as Physician Assistant (Endocrinology) Riki Altes, MD (Urology) Lorain Childes, MD as Consulting Physician (Nephrology) Toney Reil, MD as Consulting Physician (Gastroenterology) Wenda Overland, LCSW as Social Worker Delles, Jackelyn Poling, RPH-CPP as Pharmacist Earna Coder, MD as Consulting Physician (Internal Medicine)  CHIEF COMPLAINTS/PURPOSE OF CONSULTATION: ANEMIA  HEMATOLOGY HISTORY  # Hx of right Upper lobe lung cancer [[facility]]- no chemo/RT.   # ANEMIA[Hb; MCV-platelets- WBC; Iron sat; ferritin;  GFR- IV CT/US; EGD/colonoscopy-[2022- [facility]]  # CKD- IV[Dr.Korrapati]   Latest Reference Range & Units 08/13/22 14:04  Iron 38 - 169 ug/dL 67  UIBC 527 - 782 ug/dL 423  TIBC 536 - 144 ug/dL 315  Ferritin 30 - 400 ng/mL 28 (L)  Iron Saturation 15 - 55 % 23  Vitamin B12 232 - 1,245 pg/mL 629  Globulin, Total 1.5 - 4.5 g/dL 2.2  WBC 3.4 - 86.7 Y19J0/DT 4.3  RBC 4.14 - 5.80 x10E6/uL 3.19 (L)  Hemoglobin 13.0 - 17.7 g/dL 9.7 (L)  HCT 26.7 - 12.4 % 28.8 (L)  MCV 79 - 97 fL 90  MCH 26.6 - 33.0 pg 30.4  MCHC 31.5 - 35.7 g/dL 58.0  RDW 99.8 - 33.8 % 12.5  Platelets 150 - 450 x10E3/uL 200  (L): Data is abnormally low  HISTORY OF PRESENTING ILLNESS: Alone alone.  Ambulating independently.  Nicholas Nolan 72 y.o. 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 He has received IV iron and is receiving retacrit every 2 weeks. Previously hemoglobin had improved to 10 and he did not receive retacrit. He is fatigued but not significantly so. Has black stools at  baseline with oral iron. Denies shortness of breath, cough. Otherwise denies complaints.     Review of Systems  Constitutional:  Positive for malaise/fatigue. Negative for chills, diaphoresis, fever and weight loss.  HENT:  Negative for nosebleeds and sore throat.   Eyes:  Negative for double vision.  Respiratory:  Negative for cough, hemoptysis, sputum production, shortness of breath and wheezing.   Cardiovascular:  Negative for chest pain, palpitations, orthopnea and leg swelling.  Gastrointestinal:  Negative for abdominal pain, blood in stool, constipation, diarrhea, heartburn, melena, nausea and vomiting.  Genitourinary:  Negative for dysuria, frequency and urgency.  Musculoskeletal:  Negative for back pain and joint pain.  Skin: Negative.  Negative for itching and rash.  Neurological:  Positive for tingling. Negative for dizziness, focal weakness, weakness and headaches.  Endo/Heme/Allergies:  Does not bruise/bleed easily.  Psychiatric/Behavioral:  Negative for depression. The patient is not nervous/anxious and does not have insomnia.     MEDICAL HISTORY:  Past Medical History:  Diagnosis Date   Arthritis    hands   Back pain    leg pain   CAD (coronary artery disease)    a. 10/2019 Cor CTA: LM nl, LAD nl, LCX distal dzs w/ FFR 0.65-0.75. RCA nl-->Med Rx.   CKD (chronic kidney disease), stage III (HCC)    Claudication (HCC)    Diabetes mellitus    type 2   Diastolic dysfunction    a. 08/2019 Echo: EF 60-65%, mild LVH, impaired relaxation. Nl RV size/fxn. Nl LA  szie.   History of urinary retention    Hyperlipidemia    Hypertension    a. 05/2019 renal u/s concerning for R RAS; b. 06/2019 Renal artery angio: no more than15% bilat RAS.   Neuropathy    feet   Primary cancer of right upper lobe of lung (HCC) 2014   RUL Lobectomy   Wears dentures    full upper    SURGICAL HISTORY: Past Surgical History:  Procedure Laterality Date   BASAL CELL CARCINOMA EXCISION      COLONOSCOPY WITH PROPOFOL N/A 12/25/2019   Procedure: COLONOSCOPY WITH PROPOFOL;  Surgeon: Toney Reil, MD;  Location: [facility];  Service: Endoscopy;  Laterality: N/A;  Diabetic - injectable and oral meds   COLONOSCOPY WITH PROPOFOL N/A 10/08/2021   Procedure: COLONOSCOPY WITH PROPOFOL;  Surgeon: Wyline Mood, MD;  Location: [facility];  Service: Gastroenterology;  Laterality: N/A;   LUMBAR LAMINECTOMY/DECOMPRESSION MICRODISCECTOMY  01/10/2012   Procedure: LUMBAR LAMINECTOMY/DECOMPRESSION MICRODISCECTOMY 2 LEVELS;  Surgeon: Hewitt Shorts, MD;  Location: [facility];  Service: Neurosurgery;  Laterality: Bilateral;  Lumbar four-Sacral One laminectomies   LUNG SURGERY Right 11/22/12   right upper lobe   POLYPECTOMY N/A 12/25/2019   Procedure: POLYPECTOMY;  Surgeon: Toney Reil, MD;  Location: [facility];  Service: Endoscopy;  Laterality: N/A;   PROSTATE SURGERY     biopsy-due to elelvated PSA   RENAL ANGIOGRAPHY Right 07/02/2019   Procedure: RENAL ANGIOGRAPHY;  Surgeon: Annice Needy, MD;  Location: [facility] INVASIVE CV LAB;  Service: Cardiovascular;  Laterality: Right;   SHOULDER SURGERY     VASECTOMY      SOCIAL HISTORY: Social History   Socioeconomic History   Marital status: Married    Spouse name: Not on file   Number of children: 4   Years of education: Not on file   Highest education level: Associate degree: occupational, Scientist, product/process development, or vocational program  Occupational History   Occupation: retired  Tobacco Use   Smoking status: Former    Current packs/day: 0.00    Average packs/day: 2.0 packs/day for 45.0 years (90.0 ttl pk-yrs)    Types: Cigarettes    Start date: 11/29/1967    Quit date: 11/28/2012    Years since quitting: 10.7   Smokeless tobacco: Current    Types: Snuff  Vaping Use   Vaping status: Never Used  Substance and Sexual Activity   Alcohol use: No   Drug use: No   Sexual activity: Yes    Birth control/protection: None  Other  Topics Concern   Not on file  Social History Narrative   Lives in with wife; daughter; mother [alzheimer]; quit smoking- 2015 [lung cancer dx]; no alcohol; retd- air Psychiatric nurse.    Social Determinants of Health   Financial Resource Strain: Low Risk  (01/12/2023)   Overall Financial Resource Strain (CARDIA)    Difficulty of Paying Living Expenses: Not hard at all  Food Insecurity: No Food Insecurity (02/25/2022)   Hunger Vital Sign    Worried About Running Out of Food in the Last Year: Never true    Ran Out of Food in the Last Year: Never true  Transportation Needs: No Transportation Needs (01/12/2023)   PRAPARE - Administrator, Civil Service (Medical): No    Lack of Transportation (Non-Medical): No  Physical Activity: Inactive (01/12/2023)   Exercise Vital Sign    Days of Exercise per Week: 0 days    Minutes of Exercise per Session: 0  min  Stress: Stress Concern Present (01/12/2023)   [employer] of Occupational Health - Occupational Stress Questionnaire    Feeling of Stress : To some extent  Social Connections: Moderately Isolated (01/12/2023)   Social Connection and Isolation Panel [NHANES]    Frequency of Communication with Friends and Family: More than three times a week    Frequency of Social Gatherings with Friends and Family: More than three times a week    Attends Religious Services: Never    Database administrator or Organizations: No    Attends Banker Meetings: Never    Marital Status: Married  Catering manager Violence: Not At Risk (01/12/2023)   Humiliation, Afraid, Rape, and Kick questionnaire    Fear of Current or Ex-Partner: No    Emotionally Abused: No    Physically Abused: No    Sexually Abused: No    FAMILY HISTORY: Family History  Problem Relation Age of Onset   Cancer Father        prostate   Heart disease Father        MI   Stroke Father    Cancer Brother        liver    ALLERGIES:  is allergic to codeine,  hydromorphone, and morphine and codeine.  MEDICATIONS:  Current Outpatient Medications  Medication Sig Dispense Refill   carvedilol (COREG) 6.25 MG tablet Take 1 tablet (6.25 mg total) by mouth 2 (two) times daily. 180 tablet 0   Dulaglutide (TRULICITY) 1.5 MG/0.5ML SOPN Inject into the skin.     ezetimibe (ZETIA) 10 MG tablet Take 1 tablet by mouth daily.     FARXIGA 10 MG TABS tablet Take 10 mg by mouth every morning.     finasteride (PROSCAR) 5 MG tablet Take 1 tablet (5 mg total) by mouth daily. 90 tablet 3   glucose blood (ONETOUCH VERIO) test strip      hydrALAZINE (APRESOLINE) 100 MG tablet Take 1 tablet (100 mg total) by mouth 2 (two) times daily. 270 tablet 0   rOPINIRole (REQUIP) 0.5 MG tablet Take 1 tablet (0.5 mg total) by mouth at bedtime. 45 tablet 1   sertraline (ZOLOFT) 50 MG tablet Take 1 tablet (50 mg total) by mouth daily. 30 tablet 3   tamsulosin (FLOMAX) 0.4 MG CAPS capsule Take 1 capsule (0.4 mg total) by mouth daily. 90 capsule 3   traZODone (DESYREL) 50 MG tablet TAKE 0.5-1 TABLETS BY MOUTH AT BEDTIME AS NEEDED FOR SLEEP. 90 tablet 1   TRUEplus Lancets 28G MISC      No current facility-administered medications for this visit.    PHYSICAL EXAMINATION: Vitals:   09/09/23 1311  BP: (!) 165/57  Pulse: (!) 59  Temp: (!) 96.2 F (35.7 C)  SpO2: 98%   Filed Weights   09/09/23 1311  Weight: 163 lb (73.9 kg)   Physical Exam Vitals reviewed.  Constitutional:      Appearance: He is not ill-appearing.  HENT:     Head: Normocephalic and atraumatic.  Cardiovascular:     Rate and Rhythm: Normal rate and regular rhythm.  Pulmonary:     Effort: No respiratory distress.     Comments: Decreased breath sounds bilaterally.  Abdominal:     General: There is no distension.     Palpations: Abdomen is soft.  Musculoskeletal:        General: No deformity.  Skin:    General: Skin is warm.     Coloration: Skin is not pale.  Findings: Bruising present.   Neurological:     Mental Status: He is alert and oriented to person, place, and time.  Psychiatric:        Mood and Affect: Mood normal.        Behavior: Behavior normal.     LABORATORY DATA:  I have reviewed the data as listed Lab Results  Component Value Date   WBC 3.8 (L) 09/09/2023   HGB 7.8 (L) 09/09/2023   HCT 24.6 (L) 09/09/2023   MCV 98.0 09/09/2023   PLT 213 09/09/2023   Recent Labs    04/26/23 1407 04/29/23 1242 07/08/23 1312 09/09/23 1236  NA 140 139 139 138  K 4.6 5.0 4.5 4.2  CL 105 107 105 106  CO2 22 22 28 24   GLUCOSE 144* 123* 131* 131*  BUN 56* 50* 41* 45*  CREATININE 3.39* 3.25* 3.15* 3.20*  CALCIUM 8.8 8.7* 8.3* 8.3*  GFRNONAA  --  19* 20* 20*  PROT 5.6*  --  6.2* 6.0*  ALBUMIN 3.5*  --  3.4* 3.3*  AST 10  --  14* 14*  ALT 4  --  9 8  ALKPHOS 67  --  57 66  BILITOT <0.2  --  0.5 0.3  Iron/TIBC/Ferritin/ %Sat    Component Value Date/Time   IRON 92 07/08/2023 1312   IRON 67 08/13/2022 1404   TIBC 286 07/08/2023 1312   TIBC 297 08/13/2022 1404   FERRITIN 56 07/08/2023 1312   FERRITIN 28 (L) 08/13/2022 1404   IRONPCTSAT 32 07/08/2023 1312   IRONPCTSAT 23 08/13/2022 1404   No results found.  02/15/23- CT Chest abdomen pelvis wo contrast due to weight loss IMPRESSION: 1. Atheromatous calcifications. 2. Left lung nodule that does not need further follow-up unless patient is high risk. 3. Multinodular thyroid. Outpatient ultrasound correlation recommended. 4. Evidence of chronic pancreatitis. 5. Gallbladder sludge. 6. Bilateral nephrolithiasis without hydronephrosis. 7. Urinary bladder wall thickening which is consistent with hypertrophy or inflammation. 8. Enlarged prostate.   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    # Status post IV infusions; on PO iron pills [no GI issues].  Last received IV Venofer August 2024. Received retacrit 20,000 on 09/02/23. Hemoglobin 7.8. Iron studies pending. Plan for venofer today. Continue oral iron. Increase retacrit to 40,000 every 2 weeks.    # Mild leukopenia- May 2024 white count 3.2 ANC 1.4. Today, wbc 3.8, ANC normal. Asymptomatic. Monitor.    # Unintentional weight loss- [facility] 2024- CT scan chest and pelvis noncontrast-negative for any obvious malignancy. Weight has improved.   # Multinodular thyroid- incidental on CT for weight loss. Recommend workup with PCP. Messaged pcp.     # Hypertension- improved. Systolic previously in 160s. Continue home monitoring.     # CKD stage IV- [DM/HTN; Dr.Korrapati]-follow-up- GFR 19- stable.    # DM [Hb A1C-5.3]- trulicity/ SGLT2 inhbitors- stable   # CHF- chronic [Dr.Etang]- stable   # 6 mm Left lung nodule; Prior Hx of Lung cancer Right lung [s/p Lobectomy- 2014? [facility]]- follow up CT scan in October 2024. Missed appt. Rescheduled.    # Incidental findings on Imaging CT 2024: Atheromatous calcifications; Evidence of chronic pancreatitis; Gallbladder sludge. Bilateral nephrolithiasis without hydronephrosis; Urinary bladder wall thickening; Enlarged prostate. I reviewed/discussed/counseled the patient.    DISPOSITION:  Reschedule ct chest (patient did not show for  appt) Venofer today 1 week- lab (H&H, hold tube), +/- retacrit 3 weeks- (H&H, hold tube), +/- retacrit 5 weeks- (H&H, hold tube), +/- retacrit 7 weeks- (H&H, hold tube), +/- retacrit 9 weeks- lab (cbc, bmp, ferritin, iron studies, ldh), Dr Donneta Romberg, +/- venofer OR retacrit  No problem-specific Assessment & Plan notes found for this encounter.  All questions were answered. The patient knows to call the clinic with any problems, questions or concerns.  Alinda Dooms, NP 09/09/2023   CC: Dr. Neita Garnet

## 2023-09-14 ENCOUNTER — Ambulatory Visit
Admission: RE | Admit: 2023-09-14 | Discharge: 2023-09-14 | Disposition: A | Discharge status: Home / Self Care | Payer: Medicare PPO | Source: Ambulatory Visit | Attending: Nurse Practitioner | Admitting: Nurse Practitioner

## 2023-09-14 DIAGNOSIS — R911 Solitary pulmonary nodule: Secondary | ICD-10-CM | POA: Diagnosis not present

## 2023-09-14 DIAGNOSIS — J9811 Atelectasis: Secondary | ICD-10-CM | POA: Diagnosis not present

## 2023-09-14 DIAGNOSIS — J439 Emphysema, unspecified: Secondary | ICD-10-CM | POA: Diagnosis not present

## 2023-09-16 ENCOUNTER — Inpatient Hospital Stay: Payer: Medicare PPO

## 2023-09-16 VITALS — BP 148/48

## 2023-09-16 DIAGNOSIS — I130 Hypertensive heart and chronic kidney disease with heart failure and stage 1 through stage 4 chronic kidney disease, or unspecified chronic kidney disease: Secondary | ICD-10-CM | POA: Diagnosis not present

## 2023-09-16 DIAGNOSIS — E1122 Type 2 diabetes mellitus with diabetic chronic kidney disease: Secondary | ICD-10-CM | POA: Diagnosis not present

## 2023-09-16 DIAGNOSIS — D631 Anemia in chronic kidney disease: Secondary | ICD-10-CM | POA: Diagnosis not present

## 2023-09-16 DIAGNOSIS — N184 Chronic kidney disease, stage 4 (severe): Secondary | ICD-10-CM | POA: Diagnosis not present

## 2023-09-16 DIAGNOSIS — D649 Anemia, unspecified: Secondary | ICD-10-CM

## 2023-09-16 DIAGNOSIS — D509 Iron deficiency anemia, unspecified: Secondary | ICD-10-CM | POA: Diagnosis not present

## 2023-09-16 LAB — HEMOGLOBIN AND HEMATOCRIT, BLOOD
HCT: 24 % — ABNORMAL LOW (ref 39.0–52.0)
Hemoglobin: 7.7 g/dL — ABNORMAL LOW (ref 13.0–17.0)

## 2023-09-16 MED ORDER — EPOETIN ALFA-EPBX 40000 UNIT/ML IJ SOLN
40000.0000 [IU] | Freq: Once | INTRAMUSCULAR | Status: AC
Start: 2023-09-16 — End: 2023-09-16
  Administered 2023-09-16: 40000 [IU] via SUBCUTANEOUS
  Filled 2023-09-16: qty 1

## 2023-09-30 ENCOUNTER — Inpatient Hospital Stay: Payer: Medicare PPO

## 2023-09-30 ENCOUNTER — Other Ambulatory Visit: Payer: Self-pay | Admitting: *Deleted

## 2023-09-30 DIAGNOSIS — D649 Anemia, unspecified: Secondary | ICD-10-CM

## 2023-09-30 DIAGNOSIS — E1122 Type 2 diabetes mellitus with diabetic chronic kidney disease: Secondary | ICD-10-CM | POA: Diagnosis not present

## 2023-09-30 DIAGNOSIS — N184 Chronic kidney disease, stage 4 (severe): Secondary | ICD-10-CM | POA: Diagnosis not present

## 2023-09-30 DIAGNOSIS — D631 Anemia in chronic kidney disease: Secondary | ICD-10-CM | POA: Diagnosis not present

## 2023-09-30 DIAGNOSIS — I130 Hypertensive heart and chronic kidney disease with heart failure and stage 1 through stage 4 chronic kidney disease, or unspecified chronic kidney disease: Secondary | ICD-10-CM | POA: Diagnosis not present

## 2023-09-30 DIAGNOSIS — D509 Iron deficiency anemia, unspecified: Secondary | ICD-10-CM | POA: Diagnosis not present

## 2023-09-30 LAB — SAMPLE TO BLOOD BANK

## 2023-09-30 LAB — HEMOGLOBIN AND HEMATOCRIT, BLOOD
HCT: 32.5 % — ABNORMAL LOW (ref 39.0–52.0)
Hemoglobin: 10.5 g/dL — ABNORMAL LOW (ref 13.0–17.0)

## 2023-09-30 NOTE — Progress Notes (Signed)
Hgb is 10.5; hold retacrit today

## 2023-10-14 ENCOUNTER — Inpatient Hospital Stay: Payer: Medicare PPO

## 2023-10-14 ENCOUNTER — Inpatient Hospital Stay: Payer: Medicare PPO | Attending: Internal Medicine

## 2023-10-21 ENCOUNTER — Inpatient Hospital Stay: Payer: Medicare PPO

## 2023-10-21 ENCOUNTER — Inpatient Hospital Stay: Payer: Medicare PPO | Attending: Internal Medicine

## 2023-10-21 VITALS — BP 159/54 | HR 57

## 2023-10-21 DIAGNOSIS — E1122 Type 2 diabetes mellitus with diabetic chronic kidney disease: Secondary | ICD-10-CM | POA: Insufficient documentation

## 2023-10-21 DIAGNOSIS — D631 Anemia in chronic kidney disease: Secondary | ICD-10-CM | POA: Insufficient documentation

## 2023-10-21 DIAGNOSIS — N184 Chronic kidney disease, stage 4 (severe): Secondary | ICD-10-CM | POA: Insufficient documentation

## 2023-10-21 DIAGNOSIS — D649 Anemia, unspecified: Secondary | ICD-10-CM

## 2023-10-21 DIAGNOSIS — I129 Hypertensive chronic kidney disease with stage 1 through stage 4 chronic kidney disease, or unspecified chronic kidney disease: Secondary | ICD-10-CM | POA: Insufficient documentation

## 2023-10-21 LAB — HEMOGLOBIN AND HEMATOCRIT, BLOOD
HCT: 30.4 % — ABNORMAL LOW (ref 39.0–52.0)
Hemoglobin: 10 g/dL — ABNORMAL LOW (ref 13.0–17.0)

## 2023-10-21 MED ORDER — EPOETIN ALFA-EPBX 40000 UNIT/ML IJ SOLN
40000.0000 [IU] | Freq: Once | INTRAMUSCULAR | Status: AC
  Administered 2023-10-21: 40000 [IU] via SUBCUTANEOUS
  Filled 2023-10-21: qty 1

## 2023-10-21 NOTE — Patient Instructions (Signed)

## 2023-10-26 ENCOUNTER — Other Ambulatory Visit: Payer: Self-pay | Admitting: Physician Assistant

## 2023-10-26 DIAGNOSIS — I152 Hypertension secondary to endocrine disorders: Secondary | ICD-10-CM

## 2023-10-28 ENCOUNTER — Ambulatory Visit: Payer: Medicare PPO

## 2023-10-28 ENCOUNTER — Other Ambulatory Visit: Payer: Medicare PPO

## 2023-11-01 ENCOUNTER — Other Ambulatory Visit: Payer: Self-pay | Admitting: *Deleted

## 2023-11-01 DIAGNOSIS — D631 Anemia in chronic kidney disease: Secondary | ICD-10-CM

## 2023-11-01 DIAGNOSIS — D649 Anemia, unspecified: Secondary | ICD-10-CM

## 2023-11-03 ENCOUNTER — Other Ambulatory Visit: Payer: Self-pay

## 2023-11-03 MED ORDER — HYDRALAZINE HCL 100 MG PO TABS: 100.0000 mg | ORAL_TABLET | Freq: Three times a day (TID) | ORAL | 2 refills | Status: AC

## 2023-11-03 NOTE — Telephone Encounter (Signed)
Requested Prescriptions   Signed Prescriptions Disp Refills   hydrALAZINE (APRESOLINE) 100 MG tablet 270 tablet 2    Sig: Take 1 tablet (100 mg total) by mouth 3 (three) times daily.    Authorizing Provider: Debbe Odea    Ordering User: Guerry Minors   last office visit: 08/05/23, plan to f/u in 12 months--  next office visit:  none/active recall   At last visit patient was taking TID but refill sent as bid dosing with #270.  MD notes states to continue hydralazine 100 mg 3 times daily.

## 2023-11-04 ENCOUNTER — Inpatient Hospital Stay: Payer: Medicare PPO

## 2023-11-04 DIAGNOSIS — D649 Anemia, unspecified: Secondary | ICD-10-CM

## 2023-11-04 DIAGNOSIS — I129 Hypertensive chronic kidney disease with stage 1 through stage 4 chronic kidney disease, or unspecified chronic kidney disease: Secondary | ICD-10-CM | POA: Diagnosis not present

## 2023-11-04 DIAGNOSIS — D631 Anemia in chronic kidney disease: Secondary | ICD-10-CM | POA: Diagnosis not present

## 2023-11-04 DIAGNOSIS — N184 Chronic kidney disease, stage 4 (severe): Secondary | ICD-10-CM | POA: Diagnosis not present

## 2023-11-04 DIAGNOSIS — E1122 Type 2 diabetes mellitus with diabetic chronic kidney disease: Secondary | ICD-10-CM | POA: Diagnosis not present

## 2023-11-04 LAB — SAMPLE TO BLOOD BANK

## 2023-11-04 LAB — HEMOGLOBIN AND HEMATOCRIT (CANCER CENTER ONLY)
HCT: 32.7 % — ABNORMAL LOW (ref 39.0–52.0)
Hemoglobin: 10.6 g/dL — ABNORMAL LOW (ref 13.0–17.0)

## 2023-11-04 NOTE — Progress Notes (Signed)
Last dose retacrit held hgb 10.5, todays hgb 10.6, retacrit held. BP 185/65 recheck 208/62. Asymptomatic advised to f/u PCP regarding BP.

## 2023-11-14 DIAGNOSIS — E119 Type 2 diabetes mellitus without complications: Secondary | ICD-10-CM | POA: Diagnosis not present

## 2023-11-14 DIAGNOSIS — H2512 Age-related nuclear cataract, left eye: Secondary | ICD-10-CM | POA: Diagnosis not present

## 2023-11-14 DIAGNOSIS — D3131 Benign neoplasm of right choroid: Secondary | ICD-10-CM | POA: Diagnosis not present

## 2023-11-14 DIAGNOSIS — H2513 Age-related nuclear cataract, bilateral: Secondary | ICD-10-CM | POA: Diagnosis not present

## 2023-11-14 DIAGNOSIS — H2511 Age-related nuclear cataract, right eye: Secondary | ICD-10-CM | POA: Diagnosis not present

## 2023-11-14 DIAGNOSIS — Z0100 Encounter for examination of eyes and vision without abnormal findings: Secondary | ICD-10-CM | POA: Diagnosis not present

## 2023-11-14 LAB — HM DIABETES EYE EXAM

## 2023-11-18 ENCOUNTER — Inpatient Hospital Stay: Payer: Medicare PPO

## 2023-11-18 ENCOUNTER — Inpatient Hospital Stay: Payer: Medicare PPO | Admitting: Internal Medicine

## 2023-11-18 ENCOUNTER — Other Ambulatory Visit: Payer: Self-pay | Admitting: Family Medicine

## 2023-11-21 ENCOUNTER — Other Ambulatory Visit: Payer: Medicare PPO

## 2023-11-23 ENCOUNTER — Inpatient Hospital Stay: Payer: Medicare PPO

## 2023-11-23 ENCOUNTER — Encounter: Payer: Self-pay | Admitting: Internal Medicine

## 2023-11-23 ENCOUNTER — Inpatient Hospital Stay: Payer: Medicare PPO | Admitting: Internal Medicine

## 2023-11-23 ENCOUNTER — Inpatient Hospital Stay: Payer: Medicare PPO | Attending: Internal Medicine

## 2023-11-23 VITALS — BP 160/55 | HR 67 | Temp 96.7°F | Ht 70.8 in | Wt 169.2 lb

## 2023-11-23 DIAGNOSIS — D649 Anemia, unspecified: Secondary | ICD-10-CM

## 2023-11-23 DIAGNOSIS — D509 Iron deficiency anemia, unspecified: Secondary | ICD-10-CM | POA: Insufficient documentation

## 2023-11-23 DIAGNOSIS — R911 Solitary pulmonary nodule: Secondary | ICD-10-CM

## 2023-11-23 DIAGNOSIS — D631 Anemia in chronic kidney disease: Secondary | ICD-10-CM

## 2023-11-23 DIAGNOSIS — E1122 Type 2 diabetes mellitus with diabetic chronic kidney disease: Secondary | ICD-10-CM | POA: Diagnosis not present

## 2023-11-23 DIAGNOSIS — N184 Chronic kidney disease, stage 4 (severe): Secondary | ICD-10-CM | POA: Diagnosis not present

## 2023-11-23 LAB — CBC WITH DIFFERENTIAL/PLATELET
Abs Immature Granulocytes: 0.01 10*3/uL (ref 0.00–0.07)
Basophils Absolute: 0 10*3/uL (ref 0.0–0.1)
Basophils Relative: 1 %
Eosinophils Absolute: 0.1 10*3/uL (ref 0.0–0.5)
Eosinophils Relative: 4 %
HCT: 32.6 % — ABNORMAL LOW (ref 39.0–52.0)
Hemoglobin: 10.9 g/dL — ABNORMAL LOW (ref 13.0–17.0)
Immature Granulocytes: 0 %
Lymphocytes Relative: 38 %
Lymphs Abs: 1.4 10*3/uL (ref 0.7–4.0)
MCH: 31.3 pg (ref 26.0–34.0)
MCHC: 33.4 g/dL (ref 30.0–36.0)
MCV: 93.7 fL (ref 80.0–100.0)
Monocytes Absolute: 0.2 10*3/uL (ref 0.1–1.0)
Monocytes Relative: 6 %
Neutro Abs: 1.8 10*3/uL (ref 1.7–7.7)
Neutrophils Relative %: 51 %
Platelets: 141 10*3/uL — ABNORMAL LOW (ref 150–400)
RBC: 3.48 MIL/uL — ABNORMAL LOW (ref 4.22–5.81)
RDW: 12.5 % (ref 11.5–15.5)
WBC: 3.6 10*3/uL — ABNORMAL LOW (ref 4.0–10.5)
nRBC: 0 % (ref 0.0–0.2)

## 2023-11-23 LAB — BASIC METABOLIC PANEL - CANCER CENTER ONLY
Anion gap: 9 (ref 5–15)
BUN: 46 mg/dL — ABNORMAL HIGH (ref 8–23)
CO2: 25 mmol/L (ref 22–32)
Calcium: 8.4 mg/dL — ABNORMAL LOW (ref 8.9–10.3)
Chloride: 103 mmol/L (ref 98–111)
Creatinine: 3.43 mg/dL — ABNORMAL HIGH (ref 0.61–1.24)
GFR, Estimated: 18 mL/min — ABNORMAL LOW (ref >60)
Glucose, Bld: 165 mg/dL — ABNORMAL HIGH (ref 70–99)
Potassium: 3.7 mmol/L (ref 3.5–5.1)
Sodium: 137 mmol/L (ref 135–145)

## 2023-11-23 LAB — SAMPLE TO BLOOD BANK

## 2023-11-23 LAB — LACTATE DEHYDROGENASE: LDH: 141 U/L (ref 98–192)

## 2023-11-23 LAB — IRON AND TIBC
Iron: 72 ug/dL (ref 45–182)
Saturation Ratios: 29 % (ref 17.9–39.5)
TIBC: 248 ug/dL — ABNORMAL LOW (ref 250–450)
UIBC: 176 ug/dL

## 2023-11-23 LAB — FERRITIN: Ferritin: 79 ng/mL (ref 24–336)

## 2023-11-23 MED ORDER — IRON SUCROSE 20 MG/ML IV SOLN
200.0000 mg | Freq: Once | INTRAVENOUS | Status: AC
  Administered 2023-11-23: 200 mg via INTRAVENOUS
  Filled 2023-11-23: qty 10

## 2023-11-23 NOTE — Assessment & Plan Note (Addendum)
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   Proceed with venofer   today.   # Mild leukopenia white count 3.2 ANC-1.8; thrombocytopenia- platlets 144- Asymptomatic. stable  #Hypertension-poorly controlled-160 s systolic- better/stable; recommend checking BP  regularly.   # CKD stage IV- [DM/HTN; Dr.Korrapati]-follow-up- GFR 18- stable  # DM [Hb A1C-5.4]- trulicity/ SGLT2 inhbitors- stable  # CHF- chronic [Dr.Etang]- stable  # 6 mm Left lung nodule; Prior Hx of Lung cancer Right lung [s/p Lobectomy- 2014? [facility]]-   Oct 2024. NOV 2024- CT chest Slight increase in atelectasis and reticulonodular changes along the left lung base. Slight increase in lung base ground-glass as well, nonspecific. Stable juxtapleural 6 mm left-sided lung nodule. Recommend continued follow up in 6 months.  Ordered a CT scan end of April/May 2025.   Jan 2025-Q 2 w to 3 w  # DISPOSITION:  #  venofer  today; HOLD Retacrit  # Follow up in 3 weeks- labs- H&H-possible retacrit - # Follow up in 6 weeks- labs- H&H-possible retacrit -  # Follow up in 9 weeks- labs- H&H-possible retacrit -  # Follow up in 12 weeks- labs- H&H-possible retacrit -  # Follow up in 15 weeks- MD: labs- cbc;bmp; LDH; iron  studies; ferritin; possible venofer  OR retacrit ; CT chest prior Dr.B

## 2023-11-23 NOTE — Progress Notes (Signed)
 Has trouble sleeping, takes melatonin.  Appetite 75% normal, no supplements.  History of lung cancer, no new or worsening SOB.

## 2023-11-23 NOTE — Progress Notes (Signed)
 [facility] CONSULT NOTE  Patient Care Team: Mimi Alt, MD as PCP - General (Family Medicine) Constancia Delton, MD as PCP - Cardiology (Cardiology) Julia Oats, OD (Optometry) Solum, Nicolas Barren, MD as Physician Assistant (Endocrinology) Geraline Knapp, MD (Urology) Worthy Heads, MD as Consulting Physician (Nephrology) Selena Daily, MD as Consulting Physician (Gastroenterology) [address], [place] as Social Worker Gwyn Leos, MD as Consulting Physician (Internal Medicine)  CHIEF COMPLAINTS/PURPOSE OF CONSULTATION: ANEMIA  HEMATOLOGY HISTORY  # Hx of right Upper lobe lung cancer [[facility]]- no chemo/RT.   # ANEMIA[Hb; MCV-platelets- WBC; Iron  sat; ferritin;  GFR- IV CT/US ; EGD/colonoscopy-[2022- [facility]]  # CKD- IV[Dr.Korrapati]   Latest Reference Range & Units 08/13/22 14:04  Iron  38 - 169 ug/dL 67  UIBC 696 - 295 ug/dL 284  TIBC 132 - 440 ug/dL 102  Ferritin 30 - 725 ng/mL 28 (L)  Iron  Saturation 15 - 55 % 23  Vitamin B12 232 - 1,245 pg/mL 629  Globulin, Total 1.5 - 4.5 g/dL 2.2  WBC 3.4 - 36.6 Y40H4/VQ 4.3  RBC 4.14 - 5.80 x10E6/uL 3.19 (L)  Hemoglobin 13.0 - 17.7 g/dL 9.7 (L)  HCT 25.9 - 56.3 % 28.8 (L)  MCV 79 - 97 fL 90  MCH 26.6 - 33.0 pg 30.4  MCHC 31.5 - 35.7 g/dL 87.5  RDW 64.3 - 32.9 % 12.5  Platelets 150 - 450 x10E3/uL 200  (L): Data is abnormally low  HISTORY OF PRESENTING ILLNESS: Alone alone.  Ambulating independently.  Nicholas Nolan 73 y.o.  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  Patient denies any blood in stools or black-colored stools.  Denies any nausea vomiting.  Denies any worsening shortness of breath.  Continues to notice improvement of cold intolerance  and restless legs.    Review of Systems  Constitutional:  Positive for malaise/fatigue. Negative for chills, diaphoresis, fever and weight loss.  HENT:  Negative for nosebleeds and sore throat.   Eyes:  Negative for double vision.  Respiratory:  Negative for cough, hemoptysis, sputum production and wheezing.   Cardiovascular:  Negative for chest pain, palpitations, orthopnea and leg swelling.  Gastrointestinal:  Negative for abdominal pain, blood in stool, constipation, diarrhea, heartburn, melena, nausea and vomiting.  Genitourinary:  Negative for dysuria, frequency and urgency.  Musculoskeletal:  Negative for back pain and joint pain.  Skin: Negative.  Negative for itching and rash.  Neurological:  Positive for tingling. Negative for dizziness, focal weakness, weakness and headaches.  Endo/Heme/Allergies:  Does not bruise/bleed easily.  Psychiatric/Behavioral:  Negative for depression. The patient is not nervous/anxious and does not have insomnia.      MEDICAL HISTORY:  Past Medical History:  Diagnosis Date   Arthritis    hands   Back pain    leg pain   CAD (coronary artery disease)    a. 10/2019 Cor CTA: LM nl, LAD nl, LCX distal dzs w/ FFR 0.65-0.75. RCA nl-->Med Rx.   CKD (chronic kidney disease), stage III (HCC)    Claudication (HCC)    Diabetes mellitus    type 2   Diastolic dysfunction    a. 08/2019 Echo: EF 60-65%, mild LVH, impaired relaxation. Nl RV size/fxn. Nl LA szie.   History of urinary retention    Hyperlipidemia    Hypertension  a. 05/2019 renal u/s concerning for R RAS; b. 06/2019 Renal artery angio: no more than15% bilat RAS.   Neuropathy    feet   Primary cancer of right upper lobe of lung (HCC) 2014   RUL Lobectomy   Wears dentures    full upper    SURGICAL HISTORY: Past Surgical History:  Procedure Laterality Date   BASAL CELL CARCINOMA EXCISION     COLONOSCOPY WITH PROPOFOL  N/A 12/25/2019   Procedure: COLONOSCOPY WITH PROPOFOL ;  Surgeon: Selena Daily, MD;  Location: [facility];  Service: Endoscopy;  Laterality: N/A;  Diabetic - injectable and oral meds   COLONOSCOPY WITH PROPOFOL  N/A 10/08/2021   Procedure: COLONOSCOPY WITH PROPOFOL ;  Surgeon: Luke Salaam, MD;  Location: [facility] ENDOSCOPY;  Service: Gastroenterology;  Laterality: N/A;   LUMBAR LAMINECTOMY/DECOMPRESSION MICRODISCECTOMY  01/10/2012   Procedure: LUMBAR LAMINECTOMY/DECOMPRESSION MICRODISCECTOMY 2 LEVELS;  Surgeon: Corrina Dimitri, MD;  Location: [facility];  Service: Neurosurgery;  Laterality: Bilateral;  Lumbar four-Sacral One laminectomies   LUNG SURGERY Right 11/22/12   right upper lobe   POLYPECTOMY N/A 12/25/2019   Procedure: POLYPECTOMY;  Surgeon: Selena Daily, MD;  Location: [facility];  Service: Endoscopy;  Laterality: N/A;   PROSTATE SURGERY     biopsy-due to elelvated PSA   RENAL ANGIOGRAPHY Right 07/02/2019   Procedure: RENAL ANGIOGRAPHY;  Surgeon: Celso College, MD;  Location: [facility] INVASIVE CV LAB;  Service: Cardiovascular;  Laterality: Right;   SHOULDER SURGERY     VASECTOMY      SOCIAL HISTORY: Social History   Socioeconomic History   Marital status: Married    Spouse name: Not on file   Number of children: 4   Years of education: Not on file   Highest education level: Associate degree: occupational, Scientist, product/process development, or vocational program  Occupational History   Occupation: retired  Tobacco Use   Smoking status: Former    Current packs/day: 0.00    Average packs/day: 2.0 packs/day for 45.0 years (90.0 ttl pk-yrs)    Types: Cigarettes    Start date: 11/29/1967    Quit date: 11/28/2012    Years since quitting: 10.9   Smokeless tobacco: Current    Types: Snuff  Vaping Use   Vaping status: Never Used  Substance and Sexual Activity   Alcohol use: No   Drug use: No   Sexual activity: Yes    Birth control/protection: None  Other Topics Concern   Not on file  Social History Narrative   Lives in with wife; daughter; mother  [alzheimer]; quit smoking- 2015 [lung cancer dx]; no alcohol; retd- air Psychiatric nurse.    Social Drivers of Corporate investment banker Strain: Low Risk  (01/12/2023)   Overall Financial Resource Strain (CARDIA)    Difficulty of Paying Living Expenses: Not hard at all  Food Insecurity: No Food Insecurity (02/25/2022)   Hunger Vital Sign    Worried About Running Out of Food in the Last Year: Never true    Ran Out of Food in the Last Year: Never true  Transportation Needs: No Transportation Needs (01/12/2023)   PRAPARE - Administrator, Civil Service (Medical): No    Lack of Transportation (Non-Medical): No  Physical Activity: Inactive (01/12/2023)   Exercise Vital Sign    Days of Exercise per Week: 0 days    Minutes of Exercise per Session: 0 min  Stress: Stress Concern Present (01/12/2023)   [employer] of Occupational Health - Occupational Stress Questionnaire  Feeling of Stress : To some extent  Social Connections: Moderately Isolated (01/12/2023)   Social Connection and Isolation Panel [NHANES]    Frequency of Communication with Friends and Family: More than three times a week    Frequency of Social Gatherings with Friends and Family: More than three times a week    Attends Religious Services: Never    Database administrator or Organizations: No    Attends Banker Meetings: Never    Marital Status: Married  Catering manager Violence: Not At Risk (01/12/2023)   Humiliation, Afraid, Rape, and Kick questionnaire    Fear of Current or Ex-Partner: No    Emotionally Abused: No    Physically Abused: No    Sexually Abused: No    FAMILY HISTORY: Family History  Problem Relation Age of Onset   Cancer Father        prostate   Heart disease Father        MI   Stroke Father    Cancer Brother        liver    ALLERGIES:  is allergic to codeine, hydromorphone , and morphine  and codeine.  MEDICATIONS:  Current Outpatient Medications  Medication Sig  Dispense Refill   carvedilol  (COREG ) 6.25 MG tablet Take 1 tablet (6.25 mg total) by mouth 2 (two) times daily. 180 tablet 0   Dulaglutide (TRULICITY) 1.5 MG/0.5ML SOPN Inject into the skin.     ezetimibe (ZETIA) 10 MG tablet Take 1 tablet by mouth daily.     FARXIGA 10 MG TABS tablet Take 10 mg by mouth every morning.     finasteride  (PROSCAR ) 5 MG tablet Take 1 tablet (5 mg total) by mouth daily. 90 tablet 3   glucose blood (ONETOUCH VERIO) test strip      hydrALAZINE  (APRESOLINE ) 100 MG tablet Take 1 tablet (100 mg total) by mouth 3 (three) times daily. 270 tablet 2   rOPINIRole  (REQUIP ) 0.5 MG tablet TAKE 1 TABLET BY MOUTH AT BEDTIME. 90 tablet 1   sertraline  (ZOLOFT ) 50 MG tablet Take 1 tablet (50 mg total) by mouth daily. 30 tablet 3   tamsulosin  (FLOMAX ) 0.4 MG CAPS capsule Take 1 capsule (0.4 mg total) by mouth daily. 90 capsule 3   traZODone  (DESYREL ) 50 MG tablet TAKE 0.5-1 TABLETS BY MOUTH AT BEDTIME AS NEEDED FOR SLEEP. 90 tablet 1   TRUEplus Lancets 28G MISC      No current facility-administered medications for this visit.     Aaron Aas  PHYSICAL EXAMINATION:   Vitals:   11/23/23 0843 11/23/23 0854  BP: (!) 158/66 (!) 160/55  Pulse: 67   Temp: (!) 96.7 F (35.9 C)   SpO2: 99%     Filed Weights   11/23/23 0843  Weight: 169 lb 3.2 oz (76.7 kg)     Physical Exam Vitals and nursing note reviewed.  HENT:     Head: Normocephalic and atraumatic.     Mouth/Throat:     Pharynx: Oropharynx is clear.  Eyes:     Extraocular Movements: Extraocular movements intact.     Pupils: Pupils are equal, round, and reactive to light.  Cardiovascular:     Rate and Rhythm: Normal rate and regular rhythm.  Pulmonary:     Comments: Decreased breath sounds bilaterally.  Abdominal:     Palpations: Abdomen is soft.  Musculoskeletal:        General: Normal range of motion.     Cervical back: Normal range of motion.  Skin:  General: Skin is warm.  Neurological:     General: No focal  deficit present.     Mental Status: He is alert and oriented to person, place, and time.  Psychiatric:        Behavior: Behavior normal.        Judgment: Judgment normal.      LABORATORY DATA:  I have reviewed the data as listed Lab Results  Component Value Date   WBC 3.6 (L) 11/23/2023   HGB 10.9 (L) 11/23/2023   HCT 32.6 (L) 11/23/2023   MCV 93.7 11/23/2023   PLT 141 (L) 11/23/2023   Recent Labs    04/26/23 1407 04/29/23 1242 07/08/23 1312 09/09/23 1236 11/23/23 0831  NA 140   < > 139 138 137  K 4.6   < > 4.5 4.2 3.7  CL 105   < > 105 106 103  CO2 22   < > 28 24 25   GLUCOSE 144*   < > 131* 131* 165*  BUN 56*   < > 41* 45* 46*  CREATININE 3.39*   < > 3.15* 3.20* 3.43*  CALCIUM  8.8   < > 8.3* 8.3* 8.4*  GFRNONAA  --    < > 20* 20* 18*  PROT 5.6*  --  6.2* 6.0*  --   ALBUMIN 3.5*  --  3.4* 3.3*  --   AST 10  --  14* 14*  --   ALT 4  --  9 8  --   ALKPHOS 67  --  57 66  --   BILITOT <0.2  --  0.5 0.3  --    < > = values in this interval not displayed.     No results found.  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   Proceed with venofer   today.   # Mild leukopenia white count 3.2 ANC-1.8; thrombocytopenia- platlets 144- Asymptomatic. stable  #Hypertension-poorly controlled-160 s systolic- better/stable; recommend checking BP  regularly.   # CKD stage IV- [DM/HTN; Dr.Korrapati]-follow-up- GFR 18- stable  # DM [Hb A1C-5.4]- trulicity/ SGLT2 inhbitors- stable  # CHF- chronic [Dr.Etang]- stable  # 6 mm Left lung nodule; Prior Hx of Lung cancer Right lung [s/p Lobectomy- 2014? [facility]]-   Oct 2024. NOV 2024- CT chest Slight increase in atelectasis  and reticulonodular changes along the left lung base. Slight increase in lung base ground-glass as well, nonspecific. Stable juxtapleural 6 mm left-sided lung nodule. Recommend continued follow up in 6 months.  Ordered a CT scan end of April/May 2025.   Jan 2025-Q 2 w to 3 w  # DISPOSITION:  #  venofer  today; HOLD Retacrit  # Follow up in 3 weeks- labs- H&H-possible retacrit - # Follow up in 6 weeks- labs- H&H-possible retacrit -  # Follow up in 9 weeks- labs- H&H-possible retacrit -  # Follow up in 12 weeks- labs- H&H-possible retacrit -  # Follow up in 15 weeks- MD: labs- cbc;bmp; LDH; iron  studies; ferritin; possible venofer  OR retacrit ; CT chest prior Dr.B     All questions were answered. The patient knows to call the clinic with any problems, questions or concerns.    Gwyn Leos, MD 11/23/2023 9:22 AM

## 2023-12-05 ENCOUNTER — Encounter: Payer: Self-pay | Admitting: Ophthalmology

## 2023-12-05 DIAGNOSIS — D3131 Benign neoplasm of right choroid: Secondary | ICD-10-CM | POA: Diagnosis not present

## 2023-12-05 DIAGNOSIS — H2513 Age-related nuclear cataract, bilateral: Secondary | ICD-10-CM | POA: Diagnosis not present

## 2023-12-05 DIAGNOSIS — H2511 Age-related nuclear cataract, right eye: Secondary | ICD-10-CM | POA: Diagnosis not present

## 2023-12-07 ENCOUNTER — Encounter: Payer: Self-pay | Admitting: Ophthalmology

## 2023-12-14 ENCOUNTER — Ambulatory Visit: Payer: Medicare PPO

## 2023-12-14 ENCOUNTER — Other Ambulatory Visit: Payer: Medicare PPO

## 2023-12-14 NOTE — Discharge Instructions (Signed)

## 2023-12-15 ENCOUNTER — Ambulatory Visit: Payer: Medicare PPO | Admitting: Anesthesiology

## 2023-12-15 ENCOUNTER — Encounter
Admission: RE | Disposition: A | Discharge status: Home / Self Care | Payer: Self-pay | Source: Home / Self Care | Attending: Ophthalmology

## 2023-12-15 ENCOUNTER — Encounter: Payer: Self-pay | Admitting: Ophthalmology

## 2023-12-15 ENCOUNTER — Other Ambulatory Visit: Payer: Self-pay

## 2023-12-15 ENCOUNTER — Ambulatory Visit
Admission: RE | Admit: 2023-12-15 | Discharge: 2023-12-15 | Disposition: A | Discharge status: Home / Self Care | Payer: Medicare PPO | Attending: Ophthalmology | Admitting: Ophthalmology

## 2023-12-15 DIAGNOSIS — I129 Hypertensive chronic kidney disease with stage 1 through stage 4 chronic kidney disease, or unspecified chronic kidney disease: Secondary | ICD-10-CM | POA: Insufficient documentation

## 2023-12-15 DIAGNOSIS — E1136 Type 2 diabetes mellitus with diabetic cataract: Secondary | ICD-10-CM | POA: Insufficient documentation

## 2023-12-15 DIAGNOSIS — I2510 Atherosclerotic heart disease of native coronary artery without angina pectoris: Secondary | ICD-10-CM | POA: Diagnosis not present

## 2023-12-15 DIAGNOSIS — N183 Chronic kidney disease, stage 3 unspecified: Secondary | ICD-10-CM | POA: Insufficient documentation

## 2023-12-15 DIAGNOSIS — H2512 Age-related nuclear cataract, left eye: Secondary | ICD-10-CM | POA: Diagnosis not present

## 2023-12-15 DIAGNOSIS — N184 Chronic kidney disease, stage 4 (severe): Secondary | ICD-10-CM | POA: Diagnosis not present

## 2023-12-15 DIAGNOSIS — Z85828 Personal history of other malignant neoplasm of skin: Secondary | ICD-10-CM | POA: Insufficient documentation

## 2023-12-15 DIAGNOSIS — Z87891 Personal history of nicotine dependence: Secondary | ICD-10-CM | POA: Insufficient documentation

## 2023-12-15 DIAGNOSIS — E1122 Type 2 diabetes mellitus with diabetic chronic kidney disease: Secondary | ICD-10-CM | POA: Diagnosis not present

## 2023-12-15 DIAGNOSIS — Z7985 Long-term (current) use of injectable non-insulin antidiabetic drugs: Secondary | ICD-10-CM | POA: Diagnosis not present

## 2023-12-15 DIAGNOSIS — E1140 Type 2 diabetes mellitus with diabetic neuropathy, unspecified: Secondary | ICD-10-CM | POA: Diagnosis not present

## 2023-12-15 DIAGNOSIS — Z79899 Other long term (current) drug therapy: Secondary | ICD-10-CM | POA: Insufficient documentation

## 2023-12-15 DIAGNOSIS — Z7984 Long term (current) use of oral hypoglycemic drugs: Secondary | ICD-10-CM | POA: Diagnosis not present

## 2023-12-15 HISTORY — PX: CATARACT EXTRACTION W/PHACO: SHX586

## 2023-12-15 HISTORY — DX: Anemia, unspecified: D64.9

## 2023-12-15 LAB — GLUCOSE, CAPILLARY: Glucose-Capillary: 138 mg/dL — ABNORMAL HIGH (ref 70–99)

## 2023-12-15 SURGERY — PHACOEMULSIFICATION, CATARACT, WITH IOL INSERTION
Anesthesia: Monitor Anesthesia Care | Site: Eye | Laterality: Left

## 2023-12-15 MED ORDER — MIDAZOLAM HCL 2 MG/2ML IJ SOLN
INTRAMUSCULAR | Status: AC
  Filled 2023-12-15: qty 2

## 2023-12-15 MED ORDER — MIDAZOLAM HCL 2 MG/2ML IJ SOLN
INTRAMUSCULAR | Status: DC | PRN
  Administered 2023-12-15: 2 mg via INTRAVENOUS

## 2023-12-15 MED ORDER — LIDOCAINE HCL (PF) 2 % IJ SOLN
INTRAOCULAR | Status: DC | PRN
  Administered 2023-12-15: 1 mL via INTRAOCULAR

## 2023-12-15 MED ORDER — FENTANYL CITRATE (PF) 100 MCG/2ML IJ SOLN
INTRAMUSCULAR | Status: DC | PRN
  Administered 2023-12-15: 50 ug via INTRAVENOUS

## 2023-12-15 MED ORDER — SIGHTPATH DOSE#1 NA HYALUR & NA CHOND-NA HYALUR IO KIT
PACK | INTRAOCULAR | Status: DC | PRN
  Administered 2023-12-15: 1 via OPHTHALMIC

## 2023-12-15 MED ORDER — BRIMONIDINE TARTRATE-TIMOLOL 0.2-0.5 % OP SOLN
OPHTHALMIC | Status: DC | PRN
  Administered 2023-12-15: 1 [drp] via OPHTHALMIC

## 2023-12-15 MED ORDER — ARMC OPHTHALMIC DILATING DROPS
OPHTHALMIC | Status: AC
  Filled 2023-12-15: qty 0.5

## 2023-12-15 MED ORDER — TETRACAINE HCL 0.5 % OP SOLN
1.0000 [drp] | OPHTHALMIC | Status: DC | PRN
  Administered 2023-12-15 (×3): 1 [drp] via OPHTHALMIC

## 2023-12-15 MED ORDER — TETRACAINE HCL 0.5 % OP SOLN
OPHTHALMIC | Status: AC
  Filled 2023-12-15: qty 4

## 2023-12-15 MED ORDER — FENTANYL CITRATE (PF) 100 MCG/2ML IJ SOLN
INTRAMUSCULAR | Status: AC
  Filled 2023-12-15: qty 2

## 2023-12-15 MED ORDER — SIGHTPATH DOSE#1 BSS IO SOLN
INTRAOCULAR | Status: DC | PRN
  Administered 2023-12-15: 75 mL via OPHTHALMIC

## 2023-12-15 MED ORDER — ARMC OPHTHALMIC DILATING DROPS
1.0000 | OPHTHALMIC | Status: DC | PRN
  Administered 2023-12-15 (×3): 1 via OPHTHALMIC

## 2023-12-15 MED ORDER — SIGHTPATH DOSE#1 BSS IO SOLN
INTRAOCULAR | Status: DC | PRN
  Administered 2023-12-15: 15 mL

## 2023-12-15 MED ORDER — SODIUM CHLORIDE 0.9% FLUSH: 3.0000 mL | INTRAVENOUS | Status: DC | PRN

## 2023-12-15 MED ORDER — MOXIFLOXACIN HCL 0.5 % OP SOLN
OPHTHALMIC | Status: DC | PRN
  Administered 2023-12-15: .2 mL via OPHTHALMIC

## 2023-12-15 MED ORDER — SODIUM CHLORIDE 0.9% FLUSH: 3.0000 mL | Freq: Two times a day (BID) | INTRAVENOUS | Status: DC

## 2023-12-15 SURGICAL SUPPLY — 21 items
BNDG EYE OVAL 2 1/8 X 2 5/8 (GAUZE/BANDAGES/DRESSINGS) IMPLANT
CANNULA ANT/CHMB 27G (MISCELLANEOUS) IMPLANT
CANNULA ANT/CHMB 27GA (MISCELLANEOUS)
CATARACT SUITE SIGHTPATH (MISCELLANEOUS) ×1
DISSECTOR HYDRO NUCLEUS 50X22 (MISCELLANEOUS) ×1 IMPLANT
DRSG TEGADERM 2-3/8X2-3/4 SM (GAUZE/BANDAGES/DRESSINGS) ×1 IMPLANT
FEE CATARACT SUITE SIGHTPATH (MISCELLANEOUS) ×1 IMPLANT
GLOVE SURG SYN 7.5 E (GLOVE) ×1
GLOVE SURG SYN 7.5 PF PI (GLOVE) ×1 IMPLANT
GLOVE SURG SYN 8.5 E (GLOVE) ×1
GLOVE SURG SYN 8.5 PF PI (GLOVE) ×1 IMPLANT
LENS ENVISTA ASPIRE 22.0 (Intraocular Lens) ×1 IMPLANT
LENS IOL ENVISTA ASPR 22.0 (Intraocular Lens) IMPLANT
NDL FILTER BLUNT 18X1 1/2 (NEEDLE) IMPLANT
NEEDLE FILTER BLUNT 18X1 1/2 (NEEDLE)
PACK VIT ANT 23G (MISCELLANEOUS) IMPLANT
RING MALYGIN 7.0 (MISCELLANEOUS) IMPLANT
SUT ETHILON 10-0 CS-B-6CS-B-6 (SUTURE)
SUTURE EHLN 10-0 CS-B-6CS-B-6 (SUTURE) IMPLANT
SYR 3ML LL SCALE MARK (SYRINGE) IMPLANT
SYR 5ML LL (SYRINGE) IMPLANT

## 2023-12-15 NOTE — Addendum Note (Signed)
 Addendum  created 12/15/23 1054 by Enrique Harvest, MD   Attestation recorded in [place], Intraprocedure Attestations filed

## 2023-12-15 NOTE — Anesthesia Preprocedure Evaluation (Signed)
 Anesthesia Evaluation  Patient identified by MRN, date of birth, ID band Patient awake    Reviewed: Allergy & Precautions, NPO status , Patient's Chart, lab work & pertinent test results  History of Anesthesia Complications Negative for: history of anesthetic complications  Airway Mallampati: III  TM Distance: >3 FB Neck ROM: Full    Dental  (+) Edentulous Upper, Dental Advidsory Given   Pulmonary neg pulmonary ROS, former smoker Lung CA s/p RUL lobectomy   Pulmonary exam normal breath sounds clear to auscultation + decreased breath sounds      Cardiovascular hypertension, + Peripheral Vascular Disease (carotid stenosis)  negative cardio ROS Normal cardiovascular exam Rhythm:Regular Rate:Normal  ECG 07/28/21: normal   Neuro/Psych  PSYCHIATRIC DISORDERS  Depression     Neuromuscular disease (neuropathy) negative neurological ROS  negative psych ROS   GI/Hepatic negative GI ROS, Neg liver ROS,,,  Endo/Other  negative endocrine ROSdiabetes, Type 2    Renal/GU      Musculoskeletal   Abdominal   Peds  Hematology negative hematology ROS (+)   Anesthesia Other Findings Past Medical History: No date: Arthritis     Comment:  hands No date: Back pain     Comment:  leg pain No date: CAD (coronary artery disease)     Comment:  a. 10/2019 Cor CTA: LM nl, LAD nl, LCX distal dzs w/ FFR              0.65-0.75. RCA nl-->Med Rx. No date: CKD (chronic kidney disease), stage III (HCC) No date: Claudication [facility]) No date: Diabetes mellitus     Comment:  type 2 No date: Diastolic dysfunction     Comment:  a. 08/2019 Echo: EF 60-65%, mild LVH, impaired               relaxation. Nl RV size/fxn. Nl LA szie. No date: History of urinary retention No date: Hyperlipidemia No date: Hypertension     Comment:  a. 05/2019 renal u/s concerning for R RAS; b. 06/2019               Renal artery angio: no more than15% bilat RAS. No date:  Neuropathy     Comment:  feet 2014: Primary cancer of right upper lobe of lung (HCC)     Comment:  RUL Lobectomy No date: Symptomatic anemia No date: Wears dentures     Comment:  full upper  Past Surgical History: No date: BASAL CELL CARCINOMA EXCISION 12/25/2019: COLONOSCOPY WITH PROPOFOL ; N/A     Comment:  Procedure: COLONOSCOPY WITH PROPOFOL ;  Surgeon: Unk Corinn Skiff, MD;  Location: [facility];                Service: Endoscopy;  Laterality: N/A;  Diabetic -               injectable and oral meds 10/08/2021: COLONOSCOPY WITH PROPOFOL ; N/A     Comment:  Procedure: COLONOSCOPY WITH PROPOFOL ;  Surgeon: Therisa Bi, MD;  Location: [facility] ENDOSCOPY;  Service:               Gastroenterology;  Laterality: N/A; 01/10/2012: LUMBAR LAMINECTOMY/DECOMPRESSION MICRODISCECTOMY     Comment:  Procedure: LUMBAR LAMINECTOMY/DECOMPRESSION               MICRODISCECTOMY 2 LEVELS;  Surgeon: Lamar LELON Peaches,  MD;  Location: [facility];  Service: Neurosurgery;                Laterality: Bilateral;  Lumbar four-Sacral One               laminectomies 11/22/12: LUNG SURGERY; Right     Comment:  right upper lobe 12/25/2019: POLYPECTOMY; N/A     Comment:  Procedure: POLYPECTOMY;  Surgeon: Unk Corinn Skiff,               MD;  Location: [facility];  Service: Endoscopy;               Laterality: N/A; No date: PROSTATE SURGERY     Comment:  biopsy-due to elelvated PSA 07/02/2019: RENAL ANGIOGRAPHY; Right     Comment:  Procedure: RENAL ANGIOGRAPHY;  Surgeon: Marea Selinda RAMAN,               MD;  Location: [facility] INVASIVE CV LAB;  Service:               Cardiovascular;  Laterality: Right; No date: SHOULDER SURGERY No date: VASECTOMY  BMI    Body Mass Index: 23.24 kg/m      Reproductive/Obstetrics negative OB ROS                             Anesthesia Physical Anesthesia Plan  ASA: 3  Anesthesia Plan: MAC   Post-op  Pain Management:    Induction: Intravenous  PONV Risk Score and Plan: 1  Airway Management Planned: Natural Airway and Nasal Cannula  Additional Equipment:   Intra-op Plan:   Post-operative Plan:   Informed Consent: I have reviewed the patients History and Physical, chart, labs and discussed the procedure including the risks, benefits and alternatives for the proposed anesthesia with the patient or authorized representative who has indicated his/her understanding and acceptance.     Dental Advisory Given  Plan Discussed with: Anesthesiologist, CRNA and Surgeon  Anesthesia Plan Comments: (Patient consented for risks of anesthesia including but not limited to:  - adverse reactions to medications - damage to eyes, teeth, lips or other oral mucosa - nerve damage due to positioning  - sore throat or hoarseness - Damage to heart, brain, nerves, lungs, other parts of body or loss of life  Patient voiced understanding and assent.)       Anesthesia Quick Evaluation

## 2023-12-15 NOTE — H&P (Signed)
 [facility]   Primary Care Physician:  Sharma Coyer, MD Ophthalmologist: Dr. Feliciano Ober  Pre-Procedure History & Physical: HPI:  Nicholas Nolan is a 73 y.o. male here for cataract surgery.   Past Medical History:  Diagnosis Date   Arthritis    hands   Back pain    leg pain   CAD (coronary artery disease)    a. 10/2019 Cor CTA: LM nl, LAD nl, LCX distal dzs w/ FFR 0.65-0.75. RCA nl-->Med Rx.   CKD (chronic kidney disease), stage III (HCC)    Claudication (HCC)    Diabetes mellitus    type 2   Diastolic dysfunction    a. 08/2019 Echo: EF 60-65%, mild LVH, impaired relaxation. Nl RV size/fxn. Nl LA szie.   History of urinary retention    Hyperlipidemia    Hypertension    a. 05/2019 renal u/s concerning for R RAS; b. 06/2019 Renal artery angio: no more than15% bilat RAS.   Neuropathy    feet   Primary cancer of right upper lobe of lung (HCC) 2014   RUL Lobectomy   Symptomatic anemia    Wears dentures    full upper    Past Surgical History:  Procedure Laterality Date   BASAL CELL CARCINOMA EXCISION     COLONOSCOPY WITH PROPOFOL  N/A 12/25/2019   Procedure: COLONOSCOPY WITH PROPOFOL ;  Surgeon: Unk Corinn Skiff, MD;  Location: [facility];  Service: Endoscopy;  Laterality: N/A;  Diabetic - injectable and oral meds   COLONOSCOPY WITH PROPOFOL  N/A 10/08/2021   Procedure: COLONOSCOPY WITH PROPOFOL ;  Surgeon: Therisa Bi, MD;  [facility];  Service: Gastroenterology;  Laterality: N/A;   LUMBAR LAMINECTOMY/DECOMPRESSION MICRODISCECTOMY  01/10/2012   Procedure: LUMBAR LAMINECTOMY/DECOMPRESSION MICRODISCECTOMY 2 LEVELS;  Surgeon: Lamar LELON Peaches, MD;  Location: [facility];  Service: Neurosurgery;  Laterality: Bilateral;  Lumbar four-Sacral One laminectomies   LUNG SURGERY Right 11/22/12   right upper lobe   POLYPECTOMY N/A 12/25/2019   Procedure: POLYPECTOMY;  Surgeon: Unk Corinn Skiff, MD;  Location: [facility];  Service:  Endoscopy;  Laterality: N/A;   PROSTATE SURGERY     biopsy-due to elelvated PSA   RENAL ANGIOGRAPHY Right 07/02/2019   Procedure: RENAL ANGIOGRAPHY;  Surgeon: Marea Selinda RAMAN, MD;  Location: [facility] INVASIVE CV [facility];  Service: Cardiovascular;  Laterality: Right;   SHOULDER SURGERY     VASECTOMY      Prior to Admission medications   Medication Sig Start Date End Date Taking? Authorizing Provider  carvedilol  (COREG ) 6.25 MG tablet Take 1 tablet (6.25 mg total) by mouth 2 (two) times daily. 08/01/23  Yes Agbor-Etang, Redell, MD  Dulaglutide (TRULICITY) 1.5 MG/0.5ML SOPN Inject into the skin. 03/16/22  Yes [provider]  ezetimibe (ZETIA) 10 MG tablet Take 1 tablet by mouth daily. 08/01/23 07/31/24 Yes [provider]  FARXIGA 10 MG TABS tablet Take 10 mg by mouth every morning. 08/31/19  Yes [provider]  Ferrous Sulfate (IRON  PO) Take by mouth daily.   Yes [provider]  hydrALAZINE  (APRESOLINE ) 100 MG tablet Take 1 tablet (100 mg total) by mouth 3 (three) times daily. 11/03/23  Yes Agbor-Etang, Redell, MD  rOPINIRole  (REQUIP ) 0.5 MG tablet TAKE 1 TABLET BY MOUTH AT BEDTIME. 11/18/23  Yes Simmons-Robinson, Makiera, MD  tamsulosin  (FLOMAX ) 0.4 MG CAPS capsule Take 1 capsule (0.4 mg total) by mouth daily. 05/20/23  Yes Stoioff, Glendia BROCKS, MD  traZODone  (DESYREL ) 50 MG tablet TAKE 0.5-1 TABLETS BY MOUTH  AT BEDTIME AS NEEDED FOR SLEEP. 06/20/23  Yes Ostwalt, Janna, PA-C  finasteride  (PROSCAR ) 5 MG tablet Take 1 tablet (5 mg total) by mouth daily. Patient not taking: Reported on 12/05/2023 08/24/23   Vaillancourt, Samantha, PA-C  glucose blood (ONETOUCH VERIO) test strip  06/29/18   [provider]  sertraline  (ZOLOFT ) 50 MG tablet Take 1 tablet (50 mg total) by mouth daily. Patient not taking: Reported on 12/05/2023 08/15/23   Sharma Coyer, MD  TRUEplus Lancets 28G MISC  06/08/23   [provider]    Allergies as of 11/17/2023 - Review Complete  09/09/2023  Allergen Reaction Noted   Codeine Itching 01/10/2012   Hydromorphone  Itching 05/07/2016   Morphine  and codeine Itching 05/07/2016    Family History  Problem Relation Age of Onset   Cancer Father        prostate   Heart disease Father        MI   Stroke Father    Cancer Brother        liver    Social History   Socioeconomic History   Marital status: Married    Spouse name: Not on file   Number of children: 4   Years of education: Not on file   Highest education level: Associate degree: occupational, scientist, product/process development, or vocational program  Occupational History   Occupation: retired  Tobacco Use   Smoking status: Former    Current packs/day: 0.00    Average packs/day: 2.0 packs/day for 45.0 years (90.0 ttl pk-yrs)    Types: Cigarettes    Start date: 11/29/1967    Quit date: 11/28/2012    Years since quitting: 11.0   Smokeless tobacco: Current    Types: Snuff  Vaping Use   Vaping status: Never Used  Substance and Sexual Activity   Alcohol use: No   Drug use: No   Sexual activity: Yes    Birth control/protection: None  Other Topics Concern   Not on file  Social History Narrative   Lives in with wife; daughter; mother [alzheimer]; quit smoking- 2015 [lung cancer dx]; no alcohol; retd- air psychiatric nurse.    Social Drivers of Corporate Investment Banker Strain: Low Risk  (01/12/2023)   Overall Financial Resource Strain (CARDIA)    Difficulty of Paying Living Expenses: Not hard at all  Food Insecurity: No Food Insecurity (02/25/2022)   Hunger Vital Sign    Worried About Running Out of Food in the Last Year: Never true    Ran Out of Food in the Last Year: Never true  Transportation Needs: No Transportation Needs (01/12/2023)   PRAPARE - Administrator, Civil Service (Medical): No    Lack of Transportation (Non-Medical): No  Physical Activity: Inactive (01/12/2023)   Exercise Vital Sign    Days of Exercise per Week: 0 days    Minutes of Exercise per  Session: 0 min  Stress: Stress Concern Present (01/12/2023)   [employer] of Occupational Health - Occupational Stress Questionnaire    Feeling of Stress : To some extent  Social Connections: Moderately Isolated (01/12/2023)   Social Connection and Isolation Panel [NHANES]    Frequency of Communication with Friends and Family: More than three times a week    Frequency of Social Gatherings with Friends and Family: More than three times a week    Attends Religious Services: Never    Database Administrator or Organizations: No    Attends Banker Meetings: Never  Marital Status: Married  Catering Manager Violence: Not At Risk (01/12/2023)   Humiliation, Afraid, Rape, and Kick questionnaire    Fear of Current or Ex-Partner: No    Emotionally Abused: No    Physically Abused: No    Sexually Abused: No    Review of Systems: See HPI, otherwise negative ROS  Physical Exam: Ht 5' 10 (1.778 m)   Wt 76.7 kg   BMI 24.25 kg/m  General:   Alert, cooperative in NAD Head:  Normocephalic and atraumatic. Respiratory:  Normal work of breathing. Cardiovascular:  RRR  Impression/Plan: Nicholas Nolan is here for cataract surgery.  Risks, benefits, limitations, and alternatives regarding cataract surgery have been reviewed with the patient.  Questions have been answered.  All parties agreeable.   Feliciano Bryan Ober, MD  12/15/2023, 7:07 AM

## 2023-12-15 NOTE — Anesthesia Postprocedure Evaluation (Signed)
 Anesthesia Post Note  Patient: Nicholas Nolan  Procedure(s) Performed: CATARACT EXTRACTION PHACO AND INTRAOCULAR LENS PLACEMENT (IOC) LEFT DIABETIC (Left: Eye)  Patient location during evaluation: PACU Anesthesia Type: MAC Level of consciousness: awake and alert Pain management: pain level controlled Vital Signs Assessment: post-procedure vital signs reviewed and stable Respiratory status: spontaneous breathing, nonlabored ventilation, respiratory function stable and patient connected to nasal cannula oxygen Cardiovascular status: stable and blood pressure returned to baseline Postop Assessment: no apparent nausea or vomiting Anesthetic complications: no  No notable events documented.   Last Vitals:  Vitals:   12/15/23 0952 12/15/23 0957  BP: (!) 118/55 (!) 134/56  Pulse: (!) 54 (!) 53  Resp: 10 10  Temp: (!) 36.4 C (!) 36.4 C  SpO2: 98% 97%    Last Pain:  Vitals:   12/15/23 0957  PainSc: 0-No pain                 Debby Mines

## 2023-12-15 NOTE — Transfer of Care (Signed)
 Immediate Anesthesia Transfer of Care Note  Patient: Nicholas Nolan  Procedure(s) Performed: CATARACT EXTRACTION PHACO AND INTRAOCULAR LENS PLACEMENT (IOC) LEFT DIABETIC (Left: Eye)  Patient Location: PACU  Anesthesia Type: MAC  Level of Consciousness: awake, alert  and patient cooperative  Airway and Oxygen Therapy: Patient Spontanous Breathing and Patient connected to supplemental oxygen  Post-op Assessment: Post-op Vital signs reviewed, Patient's Cardiovascular Status Stable, Respiratory Function Stable, Patent Airway and No signs of Nausea or vomiting  Post-op Vital Signs: Reviewed and stable  Complications: No notable events documented.

## 2023-12-15 NOTE — Op Note (Signed)
 OPERATIVE NOTE  Nicholas Nolan [id number] 12/15/2023   PREOPERATIVE DIAGNOSIS: Nuclear sclerotic cataract left eye. H25.12   POSTOPERATIVE DIAGNOSIS: Nuclear sclerotic cataract left eye. H25.12   PROCEDURE:  Phacoemusification with posterior chamber intraocular lens placement of the left eye  Ultrasound time: Procedure(s) with comments: CATARACT EXTRACTION PHACO AND INTRAOCULAR LENS PLACEMENT (IOC) LEFT DIABETIC (Left) - 5.56 0:38.4  LENS:   Implant Name Type Inv. Item Serial No. Manufacturer Lot No. LRB No. Used Action  [employer] IOL 22.00 Intraocular Lens  [id number] [employer]  Left 1 Implanted      SURGEON:  Feliciano HERO. Enola, MD   ANESTHESIA:  Topical with tetracaine  drops, augmented with 1% preservative-free intracameral lidocaine .   COMPLICATIONS:  None.   DESCRIPTION OF PROCEDURE:  The patient was identified in the holding room and transported to the operating room and placed in the supine position under the operating microscope.  The left eye was identified as the operative eye, which was prepped and draped in the usual sterile ophthalmic fashion.   A 1 millimeter clear-corneal paracentesis was made inferotemporally. Preservative-free 1% lidocaine  mixed with 1:1,000 bisulfite-free aqueous solution of epinephrine  was injected into the anterior chamber. The anterior chamber was then filled with Viscoat viscoelastic. A 2.4 millimeter keratome was used to make a clear-corneal incision superotemporally. A curvilinear capsulorrhexis was made with a cystotome and capsulorrhexis forceps. Balanced salt  solution was used to hydrodissect and hydrodelineate the nucleus. Phacoemulsification was then used to remove the lens nucleus and epinucleus. The remaining cortex was then removed using the irrigation and aspiration handpiece. Provisc was then placed into the capsular bag to distend it for lens placement. A +22.00 D EE intraocular lens was then injected into the  capsular bag. The remaining viscoelastic was aspirated.   Wounds were hydrated with balanced salt  solution.  The anterior chamber was inflated to a physiologic pressure with balanced salt  solution.  No wound leaks were noted. Moxifloxacin  was injected intracamerally.  Timolol  and Brimonidine  drops were applied to the eye.  The patient was taken to the recovery room in stable condition without complications of anesthesia or surgery.  [employer] 12/15/2023, 9:50 AM

## 2023-12-16 ENCOUNTER — Encounter: Payer: Self-pay | Admitting: Ophthalmology

## 2023-12-16 DIAGNOSIS — H2511 Age-related nuclear cataract, right eye: Secondary | ICD-10-CM | POA: Diagnosis not present

## 2023-12-19 ENCOUNTER — Encounter: Payer: Self-pay | Admitting: Ophthalmology

## 2023-12-20 ENCOUNTER — Telehealth: Payer: Self-pay | Admitting: Cardiology

## 2023-12-20 ENCOUNTER — Inpatient Hospital Stay: Payer: Medicare PPO

## 2023-12-20 ENCOUNTER — Inpatient Hospital Stay: Payer: Medicare PPO | Attending: Internal Medicine

## 2023-12-20 DIAGNOSIS — D631 Anemia in chronic kidney disease: Secondary | ICD-10-CM | POA: Diagnosis not present

## 2023-12-20 DIAGNOSIS — N184 Chronic kidney disease, stage 4 (severe): Secondary | ICD-10-CM | POA: Diagnosis not present

## 2023-12-20 DIAGNOSIS — I129 Hypertensive chronic kidney disease with stage 1 through stage 4 chronic kidney disease, or unspecified chronic kidney disease: Secondary | ICD-10-CM | POA: Diagnosis not present

## 2023-12-20 DIAGNOSIS — D649 Anemia, unspecified: Secondary | ICD-10-CM

## 2023-12-20 DIAGNOSIS — E1122 Type 2 diabetes mellitus with diabetic chronic kidney disease: Secondary | ICD-10-CM | POA: Insufficient documentation

## 2023-12-20 LAB — HEMOGLOBIN AND HEMATOCRIT (CANCER CENTER ONLY)
HCT: 29.2 % — ABNORMAL LOW (ref 39.0–52.0)
Hemoglobin: 9.2 g/dL — ABNORMAL LOW (ref 13.0–17.0)

## 2023-12-20 NOTE — Telephone Encounter (Signed)
Patient has been experiencing higher blood pressure readings for the last- 3 weeks   180/60 after medications- this is the range on a daily basis. HR 55 this morning.  55-60 normal range.   No symptoms to report. (Denies lightheaded, dizziness, chest pain, or headache)  Patient does take his medications Carvedilol 6.25 BID, and Hydralazine 100 mg three times daily.  No changes per patient.   Advised I would route to MD to review for any recommendations and would call back.  Patient verbalized understanding.   Last OV 07/2023 - recommended 1 year follow up.   Thanks!

## 2023-12-20 NOTE — Progress Notes (Signed)
Manual BP is 184/86; will hold retacrit per provider. Advised to f/u with Cardiology with BP high lately

## 2023-12-20 NOTE — Telephone Encounter (Signed)
Pt c/o BP issue: STAT if pt c/o blurred vision, one-sided weakness or slurred speech  1. What are your last 5 BP readings? 182/67  2. Are you having any other symptoms (ex. Dizziness, headache, blurred vision, passed out)? No   3. What is your BP issue? Patient is experiencing elevated

## 2023-12-23 ENCOUNTER — Other Ambulatory Visit: Payer: Self-pay

## 2023-12-23 MED ORDER — AMLODIPINE BESYLATE 5 MG PO TABS: 5.0000 mg | ORAL_TABLET | Freq: Every day | ORAL | 3 refills | Status: DC

## 2023-12-23 NOTE — Telephone Encounter (Signed)
Called patient, sent in medication to preferred pharmacy.   Patient scheduled for follow up with Carlos Levering, NP in 4 weeks to discuss medication adjustments / bp check.   Patient verbalized understanding.

## 2023-12-24 ENCOUNTER — Other Ambulatory Visit: Payer: Self-pay | Admitting: Physician Assistant

## 2023-12-24 DIAGNOSIS — E1159 Type 2 diabetes mellitus with other circulatory complications: Secondary | ICD-10-CM

## 2023-12-24 DIAGNOSIS — G4700 Insomnia, unspecified: Secondary | ICD-10-CM

## 2023-12-26 ENCOUNTER — Other Ambulatory Visit: Payer: Self-pay

## 2023-12-26 MED ORDER — CARVEDILOL 6.25 MG PO TABS: 6.2500 mg | ORAL_TABLET | Freq: Two times a day (BID) | ORAL | 1 refills | Status: DC

## 2023-12-26 NOTE — Telephone Encounter (Signed)
 Requested Prescriptions  Pending Prescriptions Disp Refills   traZODone (DESYREL) 50 MG tablet [Pharmacy Med Name: TRAZODONE 50 MG TABLET] 90 tablet 0    Sig: TAKE 1/2 TO 1 TABLET BY MOUTH AT BEDTIME AS NEEDED FOR SLEEP     Psychiatry: Antidepressants - Serotonin Modulator Passed - 12/26/2023  1:55 PM      Passed - Completed PHQ-2 or PHQ-9 in the last 360 days      Passed - Valid encounter within last 6 months    Recent Outpatient Visits           4 months ago Fever in other diseases   [facility] Simmons-Robinson, Boutte, MD   8 months ago Acute non-recurrent frontal sinusitis   [facility] Alfredia Ferguson, PA-C   11 months ago Encounter for annual wellness exam in Medicare patient   [facility] Edwards, Davis, PA-C   1 year ago Restless leg   [facility] Alfredia Ferguson, PA-C   1 year ago Depression, major, single episode, severe [facility])   [facility] Alfredia Ferguson, PA-C       Future Appointments             In 4 weeks Wittenborn, Gavin Pound, NP  [facility] [place]   In 4 months Stoioff, Verna Czech, MD [facility]

## 2023-12-26 NOTE — Telephone Encounter (Signed)
  Last office visit:  08/05/23 with plan to f/u in 12 months Next office visit: 01/24/24  Requested Prescriptions   Signed Prescriptions Disp Refills   carvedilol (COREG) 6.25 MG tablet 180 tablet 1    Sig: Take 1 tablet (6.25 mg total) by mouth 2 (two) times daily.    Authorizing Provider: Debbe Odea    Ordering User: Guerry Minors

## 2023-12-27 NOTE — Discharge Instructions (Signed)

## 2024-01-04 ENCOUNTER — Inpatient Hospital Stay: Payer: Medicare PPO

## 2024-01-04 VITALS — BP 142/71

## 2024-01-04 DIAGNOSIS — E1122 Type 2 diabetes mellitus with diabetic chronic kidney disease: Secondary | ICD-10-CM | POA: Diagnosis not present

## 2024-01-04 DIAGNOSIS — N184 Chronic kidney disease, stage 4 (severe): Secondary | ICD-10-CM | POA: Diagnosis not present

## 2024-01-04 DIAGNOSIS — D649 Anemia, unspecified: Secondary | ICD-10-CM

## 2024-01-04 DIAGNOSIS — I129 Hypertensive chronic kidney disease with stage 1 through stage 4 chronic kidney disease, or unspecified chronic kidney disease: Secondary | ICD-10-CM | POA: Diagnosis not present

## 2024-01-04 DIAGNOSIS — D631 Anemia in chronic kidney disease: Secondary | ICD-10-CM | POA: Diagnosis not present

## 2024-01-04 LAB — HEMOGLOBIN AND HEMATOCRIT (CANCER CENTER ONLY)
HCT: 24.2 % — ABNORMAL LOW (ref 39.0–52.0)
Hemoglobin: 8 g/dL — ABNORMAL LOW (ref 13.0–17.0)

## 2024-01-04 MED ORDER — EPOETIN ALFA-EPBX 40000 UNIT/ML IJ SOLN
40000.0000 [IU] | Freq: Once | INTRAMUSCULAR | Status: AC
  Administered 2024-01-04: 40000 [IU] via SUBCUTANEOUS
  Filled 2024-01-04: qty 1

## 2024-01-09 ENCOUNTER — Encounter: Payer: Self-pay | Admitting: Ophthalmology

## 2024-01-09 ENCOUNTER — Ambulatory Visit: Payer: Self-pay | Admitting: Anesthesiology

## 2024-01-09 ENCOUNTER — Encounter
Admission: RE | Disposition: A | Discharge status: Home / Self Care | Payer: Self-pay | Source: Home / Self Care | Attending: Ophthalmology

## 2024-01-09 ENCOUNTER — Ambulatory Visit
Admission: RE | Admit: 2024-01-09 | Discharge: 2024-01-09 | Disposition: A | Discharge status: Home / Self Care | Payer: Medicare PPO | Attending: Ophthalmology | Admitting: Ophthalmology

## 2024-01-09 DIAGNOSIS — M1990 Unspecified osteoarthritis, unspecified site: Secondary | ICD-10-CM | POA: Insufficient documentation

## 2024-01-09 DIAGNOSIS — G4730 Sleep apnea, unspecified: Secondary | ICD-10-CM | POA: Diagnosis not present

## 2024-01-09 DIAGNOSIS — F32A Depression, unspecified: Secondary | ICD-10-CM | POA: Insufficient documentation

## 2024-01-09 DIAGNOSIS — E1136 Type 2 diabetes mellitus with diabetic cataract: Secondary | ICD-10-CM | POA: Insufficient documentation

## 2024-01-09 DIAGNOSIS — H2511 Age-related nuclear cataract, right eye: Secondary | ICD-10-CM | POA: Diagnosis not present

## 2024-01-09 DIAGNOSIS — I2510 Atherosclerotic heart disease of native coronary artery without angina pectoris: Secondary | ICD-10-CM | POA: Insufficient documentation

## 2024-01-09 DIAGNOSIS — Z87891 Personal history of nicotine dependence: Secondary | ICD-10-CM | POA: Diagnosis not present

## 2024-01-09 DIAGNOSIS — D631 Anemia in chronic kidney disease: Secondary | ICD-10-CM | POA: Diagnosis not present

## 2024-01-09 DIAGNOSIS — N183 Chronic kidney disease, stage 3 unspecified: Secondary | ICD-10-CM | POA: Insufficient documentation

## 2024-01-09 DIAGNOSIS — I129 Hypertensive chronic kidney disease with stage 1 through stage 4 chronic kidney disease, or unspecified chronic kidney disease: Secondary | ICD-10-CM | POA: Diagnosis not present

## 2024-01-09 DIAGNOSIS — E1122 Type 2 diabetes mellitus with diabetic chronic kidney disease: Secondary | ICD-10-CM | POA: Insufficient documentation

## 2024-01-09 DIAGNOSIS — E1151 Type 2 diabetes mellitus with diabetic peripheral angiopathy without gangrene: Secondary | ICD-10-CM | POA: Diagnosis not present

## 2024-01-09 DIAGNOSIS — Z7984 Long term (current) use of oral hypoglycemic drugs: Secondary | ICD-10-CM | POA: Insufficient documentation

## 2024-01-09 DIAGNOSIS — E0590 Thyrotoxicosis, unspecified without thyrotoxic crisis or storm: Secondary | ICD-10-CM | POA: Insufficient documentation

## 2024-01-09 HISTORY — PX: CATARACT EXTRACTION W/PHACO: SHX586

## 2024-01-09 LAB — GLUCOSE, CAPILLARY: Glucose-Capillary: 127 mg/dL — ABNORMAL HIGH (ref 70–99)

## 2024-01-09 SURGERY — PHACOEMULSIFICATION, CATARACT, WITH IOL INSERTION
Anesthesia: Monitor Anesthesia Care | Site: Eye | Laterality: Right

## 2024-01-09 MED ORDER — LIDOCAINE HCL (PF) 2 % IJ SOLN
INTRAOCULAR | Status: DC | PRN
  Administered 2024-01-09: 4 mL via INTRAOCULAR

## 2024-01-09 MED ORDER — SIGHTPATH DOSE#1 NA HYALUR & NA CHOND-NA HYALUR IO KIT
PACK | INTRAOCULAR | Status: DC | PRN
  Administered 2024-01-09: 1 via OPHTHALMIC

## 2024-01-09 MED ORDER — ARMC OPHTHALMIC DILATING DROPS
1.0000 | OPHTHALMIC | Status: DC | PRN
  Administered 2024-01-09 (×3): 1 via OPHTHALMIC

## 2024-01-09 MED ORDER — BRIMONIDINE TARTRATE-TIMOLOL 0.2-0.5 % OP SOLN
OPHTHALMIC | Status: DC | PRN
  Administered 2024-01-09: 1 [drp] via OPHTHALMIC

## 2024-01-09 MED ORDER — SIGHTPATH DOSE#1 BSS IO SOLN
INTRAOCULAR | Status: DC | PRN
  Administered 2024-01-09: 15 mL via INTRAOCULAR

## 2024-01-09 MED ORDER — ARMC OPHTHALMIC DILATING DROPS
OPHTHALMIC | Status: AC
  Filled 2024-01-09: qty 0.5

## 2024-01-09 MED ORDER — SIGHTPATH DOSE#1 BSS IO SOLN
INTRAOCULAR | Status: DC | PRN
  Administered 2024-01-09: 87 mL via OPHTHALMIC

## 2024-01-09 MED ORDER — TETRACAINE HCL 0.5 % OP SOLN
1.0000 [drp] | OPHTHALMIC | Status: DC | PRN
  Administered 2024-01-09 (×3): 1 [drp] via OPHTHALMIC

## 2024-01-09 MED ORDER — TETRACAINE HCL 0.5 % OP SOLN
OPHTHALMIC | Status: AC
  Filled 2024-01-09: qty 4

## 2024-01-09 MED ORDER — MIDAZOLAM HCL 2 MG/2ML IJ SOLN
INTRAMUSCULAR | Status: DC | PRN
  Administered 2024-01-09: 1 mg via INTRAVENOUS

## 2024-01-09 MED ORDER — MOXIFLOXACIN HCL 0.5 % OP SOLN
OPHTHALMIC | Status: DC | PRN
  Administered 2024-01-09: .2 mL via OPHTHALMIC

## 2024-01-09 MED ORDER — MIDAZOLAM HCL 2 MG/2ML IJ SOLN
INTRAMUSCULAR | Status: AC
  Filled 2024-01-09: qty 2

## 2024-01-09 SURGICAL SUPPLY — 23 items
BNDG EYE OVAL 2 1/8 X 2 5/8 (GAUZE/BANDAGES/DRESSINGS) IMPLANT
CANNULA ANT/CHMB 27G (MISCELLANEOUS) IMPLANT
CANNULA ANT/CHMB 27GA (MISCELLANEOUS) IMPLANT
CATARACT SUITE SIGHTPATH (MISCELLANEOUS) ×1 IMPLANT
DISSECTOR HYDRO NUCLEUS 50X22 (MISCELLANEOUS) ×1 IMPLANT
DRSG TEGADERM 2-3/8X2-3/4 SM (GAUZE/BANDAGES/DRESSINGS) ×1 IMPLANT
FEE CATARACT SUITE SIGHTPATH (MISCELLANEOUS) ×1 IMPLANT
GLOVE BIOGEL PI IND STRL 8 (GLOVE) ×1 IMPLANT
GLOVE PI ULTRA LF STRL 7.5 (GLOVE) IMPLANT
GLOVE SURG LX STRL 7.5 STRW (GLOVE) ×1 IMPLANT
GLOVE SURG PROTEXIS BL SZ6.5 (GLOVE) ×1 IMPLANT
GLOVE SURG SYN 6.5 PF PI BL (GLOVE) ×1 IMPLANT
LENS ENVISTA 21.5 (Intraocular Lens) ×1 IMPLANT
LENS IOL ENVISTA UV+ 21.5 (Intraocular Lens) IMPLANT
NDL FILTER BLUNT 18X1 1/2 (NEEDLE) ×1 IMPLANT
NEEDLE FILTER BLUNT 18X1 1/2 (NEEDLE) ×1 IMPLANT
PACK VIT ANT 23G (MISCELLANEOUS) IMPLANT
RING MALYGIN (MISCELLANEOUS) ×1 IMPLANT
RING MALYGIN 7.0 (MISCELLANEOUS) IMPLANT
SUT ETHILON 10-0 CS-B-6CS-B-6 (SUTURE) IMPLANT
SUTURE EHLN 10-0 CS-B-6CS-B-6 (SUTURE) IMPLANT
SYR 3ML LL SCALE MARK (SYRINGE) ×1 IMPLANT
SYR 5ML LL (SYRINGE) IMPLANT

## 2024-01-09 NOTE — Anesthesia Postprocedure Evaluation (Signed)
 Anesthesia Post Note  Patient: Nicholas Nolan  Procedure(s) Performed: CATARACT EXTRACTION PHACO AND INTRAOCULAR LENS PLACEMENT (IOC) RIGHT DIABETIC 6.18 00:40.1 (Right: Eye)  Patient location during evaluation: PACU Anesthesia Type: MAC Level of consciousness: awake and alert Pain management: pain level controlled Vital Signs Assessment: post-procedure vital signs reviewed and stable Respiratory status: spontaneous breathing, nonlabored ventilation, respiratory function stable and patient connected to nasal cannula oxygen Cardiovascular status: stable and blood pressure returned to baseline Postop Assessment: no apparent nausea or vomiting Anesthetic complications: no   No notable events documented.   Last Vitals:  Vitals:   01/09/24 1225 01/09/24 1228  BP: (!) 159/49 (!) 179/57  Pulse: (!) 58 61  Resp: 12 18  Temp: 36.7 C 36.7 C  SpO2: 99% 100%    Last Pain:  Vitals:   01/09/24 1228  TempSrc:   PainSc: 0-No pain                 Lesle Faron C Erasmus Bistline

## 2024-01-09 NOTE — Op Note (Signed)
 OPERATIVE NOTE  Nicholas Nolan [id number] 01/09/2024   PREOPERATIVE DIAGNOSIS: Nuclear sclerotic cataract right eye. H25.11   POSTOPERATIVE DIAGNOSIS: Nuclear sclerotic cataract right eye. H25.11   PROCEDURE:  Phacoemusification with posterior chamber intraocular lens placement of the right eye  Ultrasound time: Procedure(s): CATARACT EXTRACTION PHACO AND INTRAOCULAR LENS PLACEMENT (IOC) RIGHT DIABETIC 6.18 00:40.1 (Right)  LENS:   Implant Name Type Inv. Item Serial No. Manufacturer Lot No. LRB No. Used Action  ENVISTA IOL   [id number] [facility] AND LOMB SURGICAL [id number] Right 1 Implanted      SURGEON:  Julious Payer. Rolley Sims, MD   ANESTHESIA:  Topical with tetracaine drops, augmented with 1% preservative-free intracameral lidocaine.   COMPLICATIONS:  None.   DESCRIPTION OF PROCEDURE:  The patient was identified in the holding room and transported to the operating room and placed in the supine position under the operating microscope.  The right eye was identified as the operative eye, which was prepped and draped in the usual sterile ophthalmic fashion.   A 1 millimeter clear-corneal paracentesis was made superotemporally. Preservative-free 1% lidocaine mixed with 1:1,000 bisulfite-free aqueous solution of epinephrine was injected into the anterior chamber. The anterior chamber was then filled with Viscoat viscoelastic. A 2.4 millimeter keratome was used to make a clear-corneal incision inferotemporally. A curvilinear capsulorrhexis was made with a cystotome and capsulorrhexis forceps. Balanced salt solution was used to hydrodissect and hydrodelineate the nucleus. Phacoemulsification was then used to remove the lens nucleus and epinucleus. The remaining cortex was then removed using the irrigation and aspiration handpiece. Provisc was then placed into the capsular bag to distend it for lens placement. A +21.50 D EE intraocular lens was then injected into the capsular bag. The remaining viscoelastic  was aspirated.   Wounds were hydrated with balanced salt solution.  The anterior chamber was inflated to a physiologic pressure with balanced salt solution.  No wound leaks were noted. Moxifloxacin was injected intracamerally.  Timolol and Brimonidine drops were applied to the eye.  The patient was taken to the recovery room in stable condition without complications of anesthesia or surgery.  Nicholas Nolan 01/09/2024, 12:22 PM

## 2024-01-09 NOTE — Transfer of Care (Signed)
 Immediate Anesthesia Transfer of Care Note  Patient: Nicholas Nolan  Procedure(s) Performed: CATARACT EXTRACTION PHACO AND INTRAOCULAR LENS PLACEMENT (IOC) RIGHT DIABETIC 6.18 00:40.1 (Right: Eye)  Patient Location: PACU  Anesthesia Type: MAC  Level of Consciousness: awake, alert  and patient cooperative  Airway and Oxygen Therapy: Patient Spontanous Breathing and Patient connected to supplemental oxygen  Post-op Assessment: Post-op Vital signs reviewed, Patient's Cardiovascular Status Stable, Respiratory Function Stable, Patent Airway and No signs of Nausea or vomiting  Post-op Vital Signs: Reviewed and stable  Complications: No notable events documented.

## 2024-01-09 NOTE — Anesthesia Preprocedure Evaluation (Addendum)
 Anesthesia Evaluation  Patient identified by MRN, date of birth, ID band Patient awake    Reviewed: Allergy & Precautions, H&P , NPO status , Patient's Chart, lab work & pertinent test results  Airway Mallampati: III  TM Distance: >3 FB Neck ROM: Full    Dental no notable dental hx. (+) Upper Dentures   Pulmonary sleep apnea , former smoker   Pulmonary exam normal breath sounds clear to auscultation       Cardiovascular hypertension, + CAD and + Peripheral Vascular Disease  Normal cardiovascular exam Rhythm:Regular Rate:Normal   1. Left ventricular ejection fraction, by visual estimation, is 60 to  65%. The left ventricle has normal function. Normal left ventricular size.  There is mildly increased left ventricular hypertrophy.   2. Left ventricular diastolic Doppler parameters are consistent with  impaired relaxation pattern of LV diastolic filling.   3. Global right ventricle has normal systolic function.The right  ventricular size is normal. No increase in right ventricular wall  thickness.   4. Left atrial size was normal.   5. TR signal is inadequate for assessing pulmonary artery systolic  pressure.     Neuro/Psych  PSYCHIATRIC DISORDERS  Depression    negative neurological ROS  negative psych ROS   GI/Hepatic negative GI ROS, Neg liver ROS,,,  Endo/Other  diabetes Hyperthyroidism   Renal/GU Renal diseasenegative Renal ROS  negative genitourinary   Musculoskeletal negative musculoskeletal ROS (+) Arthritis ,    Abdominal   Peds negative pediatric ROS (+)  Hematology negative hematology ROS (+) Blood dyscrasia, anemia   Anesthesia Other Findings Hypertension  Neuropathy Back pain  Diabetes mellitus Hyperlipidemia  Primary cancer of right upper lobe of lung  History of urinary retention  Arthritis Wears dentures  Claudication (HCC) CAD (coronary artery disease) Diastolic dysfunction CKD (chronic  kidney disease), stage III Symptomatic anemia Previous cataract surgery 12-15-23    Reproductive/Obstetrics negative OB ROS                              Anesthesia Physical Anesthesia Plan  ASA: 3  Anesthesia Plan: MAC   Post-op Pain Management:    Induction: Intravenous  PONV Risk Score and Plan:   Airway Management Planned: Natural Airway and Nasal Cannula  Additional Equipment:   Intra-op Plan:   Post-operative Plan:   Informed Consent: I have reviewed the patients History and Physical, chart, labs and discussed the procedure including the risks, benefits and alternatives for the proposed anesthesia with the patient or authorized representative who has indicated his/her understanding and acceptance.     Dental Advisory Given  Plan Discussed with: Anesthesiologist, CRNA and Surgeon  Anesthesia Plan Comments: (Patient consented for risks of anesthesia including but not limited to:  - adverse reactions to medications - damage to eyes, teeth, lips or other oral mucosa - nerve damage due to positioning  - sore throat or hoarseness - Damage to heart, brain, nerves, lungs, other parts of body or loss of life  Patient voiced understanding and assent.)        Anesthesia Quick Evaluation

## 2024-01-09 NOTE — H&P (Signed)
 [facility]   Primary Care Physician:  Ronnald Ramp, MD Ophthalmologist: Dr. Deberah Pelton  Pre-Procedure History & Physical: HPI:  Nicholas Nolan is a 73 y.o. male here for cataract surgery.   Past Medical History:  Diagnosis Date   Arthritis    hands   Back pain    leg pain   CAD (coronary artery disease)    a. 10/2019 Cor CTA: LM nl, LAD nl, LCX distal dzs w/ FFR 0.65-0.75. RCA nl-->Med Rx.   CKD (chronic kidney disease), stage III (HCC)    Claudication (HCC)    Diabetes mellitus    type 2   Diastolic dysfunction    a. 08/2019 Echo: EF 60-65%, mild LVH, impaired relaxation. Nl RV size/fxn. Nl LA szie.   History of urinary retention    Hyperlipidemia    Hypertension    a. 05/2019 renal u/s concerning for R RAS; b. 06/2019 Renal artery angio: no more than15% bilat RAS.   Neuropathy    feet   Primary cancer of right upper lobe of lung (HCC) 2014   RUL Lobectomy   Symptomatic anemia    Wears dentures    full upper    Past Surgical History:  Procedure Laterality Date   BASAL CELL CARCINOMA EXCISION     CATARACT EXTRACTION W/PHACO Left 12/15/2023   Procedure: CATARACT EXTRACTION PHACO AND INTRAOCULAR LENS PLACEMENT (IOC) LEFT DIABETIC;  Surgeon: Estanislado Pandy, MD;  Location: [facility];  Service: Ophthalmology;  Laterality: Left;  5.56 0:38.4   COLONOSCOPY WITH PROPOFOL N/A 12/25/2019   Procedure: COLONOSCOPY WITH PROPOFOL;  Surgeon: Toney Reil, MD;  Location: [facility];  Service: Endoscopy;  Laterality: N/A;  Diabetic - injectable and oral meds   COLONOSCOPY WITH PROPOFOL N/A 10/08/2021   Procedure: COLONOSCOPY WITH PROPOFOL;  Surgeon: Wyline Mood, MD;  Location: [facility] ENDOSCOPY;  Service: Gastroenterology;  Laterality: N/A;   LUMBAR LAMINECTOMY/DECOMPRESSION MICRODISCECTOMY  01/10/2012   Procedure: LUMBAR LAMINECTOMY/DECOMPRESSION MICRODISCECTOMY 2 LEVELS;  Surgeon: Hewitt Shorts, MD;  Location: [facility] NEURO ORS;   Service: Neurosurgery;  Laterality: Bilateral;  Lumbar four-Sacral One laminectomies   LUNG SURGERY Right 11/22/12   right upper lobe   POLYPECTOMY N/A 12/25/2019   Procedure: POLYPECTOMY;  Surgeon: Toney Reil, MD;  Location: [facility];  Service: Endoscopy;  Laterality: N/A;   PROSTATE SURGERY     biopsy-due to elelvated PSA   RENAL ANGIOGRAPHY Right 07/02/2019   Procedure: RENAL ANGIOGRAPHY;  Surgeon: Annice Needy, MD;  Location: [facility] INVASIVE CV LAB;  Service: Cardiovascular;  Laterality: Right;   SHOULDER SURGERY     VASECTOMY      Prior to Admission medications   Medication Sig Start Date End Date Taking? Authorizing Provider  amLODipine (NORVASC) 5 MG tablet Take 1 tablet (5 mg total) by mouth daily. 12/23/23 03/22/24 Yes Agbor-Etang, Arlys John, MD  carvedilol (COREG) 6.25 MG tablet Take 1 tablet (6.25 mg total) by mouth 2 (two) times daily. 12/26/23  Yes Agbor-Etang, Arlys John, MD  Dulaglutide (TRULICITY) 1.5 MG/0.5ML SOPN Inject into the skin. 03/16/22  Yes [provider]  ezetimibe (ZETIA) 10 MG tablet Take 1 tablet by mouth daily. 08/01/23 07/31/24 Yes [provider]  FARXIGA 10 MG TABS tablet Take 10 mg by mouth every morning. 08/31/19  Yes [provider]  Ferrous Sulfate (IRON PO) Take by mouth daily.   Yes [provider]  hydrALAZINE (APRESOLINE) 100 MG tablet Take 1 tablet (100 mg total) by mouth 3 (three)  times daily. 11/03/23  Yes Agbor-Etang, Arlys John, MD  hydrALAZINE (APRESOLINE) 50 MG tablet TAKE 50 MG IN THE MORNING AND AT MIDDAY, 100 MG AT NIGHT 12/26/23  Yes Simmons-Robinson, Makiera, MD  rOPINIRole (REQUIP) 0.5 MG tablet TAKE 1 TABLET BY MOUTH AT BEDTIME. 11/18/23  Yes Simmons-Robinson, Makiera, MD  tamsulosin (FLOMAX) 0.4 MG CAPS capsule Take 1 capsule (0.4 mg total) by mouth daily. 05/20/23  Yes Stoioff, Verna Czech, MD  traZODone (DESYREL) 50 MG tablet TAKE 1/2 TO 1 TABLET BY MOUTH AT BEDTIME AS NEEDED FOR SLEEP 12/26/23  Yes  Simmons-Robinson, Makiera, MD  finasteride (PROSCAR) 5 MG tablet Take 1 tablet (5 mg total) by mouth daily. Patient not taking: Reported on 12/05/2023 08/24/23   Carman Ching, PA-C  glucose blood (ONETOUCH VERIO) test strip  06/29/18   [provider]  sertraline (ZOLOFT) 50 MG tablet Take 1 tablet (50 mg total) by mouth daily. Patient not taking: Reported on 12/05/2023 08/15/23   Ronnald Ramp, MD  TRUEplus Lancets 28G MISC  06/08/23   [provider]    Allergies as of 11/17/2023 - Review Complete 09/09/2023  Allergen Reaction Noted   Codeine Itching 01/10/2012   Hydromorphone Itching 05/07/2016   Morphine and codeine Itching 05/07/2016    Family History  Problem Relation Age of Onset   Cancer Father        prostate   Heart disease Father        MI   Stroke Father    Cancer Brother        liver    Social History   Socioeconomic History   Marital status: Married    Spouse name: Not on file   Number of children: 4   Years of education: Not on file   Highest education level: Associate degree: occupational, Scientist, product/process development, or vocational program  Occupational History   Occupation: retired  Tobacco Use   Smoking status: Former    Current packs/day: 0.00    Average packs/day: 2.0 packs/day for 45.0 years (90.0 ttl pk-yrs)    Types: Cigarettes    Start date: 11/29/1967    Quit date: 11/28/2012    Years since quitting: 11.1   Smokeless tobacco: Current    Types: Snuff  Vaping Use   Vaping status: Never Used  Substance and Sexual Activity   Alcohol use: No   Drug use: No   Sexual activity: Yes    Birth control/protection: None  Other Topics Concern   Not on file  Social History Narrative   Lives in with wife; daughter; mother [alzheimer]; quit smoking- 2015 [lung cancer dx]; no alcohol; retd- air Psychiatric nurse.    Social Drivers of Corporate investment banker Strain: Low Risk  (01/12/2023)   Overall Financial Resource Strain  (CARDIA)    Difficulty of Paying Living Expenses: Not hard at all  Food Insecurity: No Food Insecurity (02/25/2022)   Hunger Vital Sign    Worried About Running Out of Food in the Last Year: Never true    Ran Out of Food in the Last Year: Never true  Transportation Needs: No Transportation Needs (01/12/2023)   PRAPARE - Administrator, Civil Service (Medical): No    Lack of Transportation (Non-Medical): No  Physical Activity: Inactive (01/12/2023)   Exercise Vital Sign    Days of Exercise per Week: 0 days    Minutes of Exercise per Session: 0 min  Stress: Stress Concern Present (01/12/2023)   [employer] of Occupational Health - Occupational  Stress Questionnaire    Feeling of Stress : To some extent  Social Connections: Moderately Isolated (01/12/2023)   Social Connection and Isolation Panel [NHANES]    Frequency of Communication with Friends and Family: More than three times a week    Frequency of Social Gatherings with Friends and Family: More than three times a week    Attends Religious Services: Never    Database administrator or Organizations: No    Attends Banker Meetings: Never    Marital Status: Married  Catering manager Violence: Not At Risk (01/12/2023)   Humiliation, Afraid, Rape, and Kick questionnaire    Fear of Current or Ex-Partner: No    Emotionally Abused: No    Physically Abused: No    Sexually Abused: No    Review of Systems: See HPI, otherwise negative ROS  Physical Exam: Ht 5\' 10"  (1.778 m)   Wt 73.5 kg   BMI 23.24 kg/m  General:   Alert, cooperative in NAD Head:  Normocephalic and atraumatic. Respiratory:  Normal work of breathing. Cardiovascular:  RRR  Impression/Plan: Nicholas Nolan is here for cataract surgery.  Risks, benefits, limitations, and alternatives regarding cataract surgery have been reviewed with the patient.  Questions have been answered.  All parties agreeable.   Estanislado Pandy, MD  01/09/2024, 11:37  AM

## 2024-01-10 ENCOUNTER — Encounter: Payer: Self-pay | Admitting: Ophthalmology

## 2024-01-13 ENCOUNTER — Telehealth: Payer: Self-pay | Admitting: Cardiology

## 2024-01-13 MED ORDER — AMLODIPINE BESYLATE 2.5 MG PO TABS: 2.5000 mg | ORAL_TABLET | Freq: Every day | ORAL | 1 refills | Status: DC

## 2024-01-13 NOTE — Telephone Encounter (Signed)
 Please advise. Patient is wanting his Amlodipine decreased to 2.5. Below is from the nephrologist's office note on yesterday.  "#4: Hypertension: Patient is advised to stay on 2 g salt restricted diet. I advised him to continue the carvedilol of 6.25 mg twice a day, amlodipine 5 mg daily and hydralazine 50 mg in the a.m. and afternoon and 100 mg at nighttime. He is advised to stay on 2 g salt restricted diet. I advised him to decrease the amlodipine to 2.5 mg daily."

## 2024-01-13 NOTE — Telephone Encounter (Signed)
 Called and left a detailed message per DPR. Notified patient of the following from Dr. Azucena Cecil.  That is ok to reduced norvasc to 2.5. Patient should check bp at home and keep log.   Asked patient to call back with any questions. Prescription sent to preferred pharmacy.

## 2024-01-13 NOTE — Telephone Encounter (Signed)
*  STAT* If patient is at the pharmacy, call can be transferred to refill team.   1. Which medications need to be refilled? (please list name of each medication and dose if known) new prescription for Amlodipine 2.5 mg - it was changed by his Nephrologist([facility]) from 5 mg to 2.5 mg- 5 mg is too small to cut in half   2. Would you like to learn more about the convenience, safety, & potential cost savings by using the [facility]?    3. Are you open to using the [facility] (Type [facility]. .   4. Which pharmacy/location (including street and city if local pharmacy) is medication to be sent [facility] [place]  5. Do they need a 30 day or 90 day supply? 90 days and refills

## 2024-01-22 ENCOUNTER — Other Ambulatory Visit: Payer: Self-pay | Admitting: Family Medicine

## 2024-01-22 DIAGNOSIS — I152 Hypertension secondary to endocrine disorders: Secondary | ICD-10-CM

## 2024-01-22 NOTE — Progress Notes (Unsigned)
 Cardiology Clinic Note   Date: 01/24/2024 ID: Nicholas Nolan, DOB [birth year], MRN [id number]  Primary Cardiologist:  Debbe Odea, MD  Chief Complaint   Nicholas Nolan is a 73 y.o. male who presents to the clinic today for routine follow up.   Patient Profile   Nicholas Nolan is followed by Dr. Azucena Cecil for the history outlined below.      Past medical history significant for: CAD Coronary CTA with FFR 10/11/2019: Coronary calcium score 1689.  Severe calcific plaques in the mid LAD and RCA.  FFR analysis showed significant stenosis of the distal segment of the LCx which will is a small vessel <2 mm in diameter.  Recommend GDMT. Chronic DOE. Echo 08/24/2019: EF 60 to 65%.  No RWMA.  Mild LVH.  Grade I DD.  Normal RV size/function. Hypertension. Renal artery duplex 06/01/2019: Elevated peak systolic velocity in the mid segment of the right renal artery concerning for hemodynamically significant renal artery stenosis.  No evidence of significant left renal artery stenosis. Renal angiography 07/02/2019: Relatively normal aorta and iliac arteries without significant stenosis.  Neither renal artery more than about 15% stenosis on selective imaging. Hyperlipidemia. PAD. LEA [place] 08/11/2020: Right: Atherosclerosis of the common femoral, femoral, and popliteal and tibial arteries.  Elevated velocities throughout the right lower extremity with 75 to 99% stenosis in the mid SFA.  Left: Atherosclerosis of the common femoral, femoral, and popliteal and tibial arteries.  Elevated velocities throughout the left lower extremity with a short segment occlusion in the mid SFA which reconstitutes mid/distal SFA. ABI 08/10/2021: Moderate bilateral lower extremity arterial disease. Carotid artery stenosis.  Carotid duplex 08/10/2021: Bilateral ICA 40 to 59%, bilateral ECA > 50%. OSA. Lung cancer. Hypothyroidism. T2DM. CKD stage IV.  In summary, patient was first evaluated by Dr. Azucena Cecil on  07/24/2019 for resistant hypertension and DOE.  Echo demonstrated normal LV/RV function as detailed above.  Renal artery duplex was concerning for right renal artery stenosis.  Patient was referred to vascular surgery and underwent renal angiography in August 2021 which demonstrated no significant renal artery artery stenosis as detailed above.  Coronary CTA with FFR in December 2020 demonstrated significant stenosis of the distal LCx which was a small vessel as detailed above, GDMT was recommended.  Patient was last seen in the office by Dr. Azucena Cecil on 08/05/2023 for routine follow-up.  Patient reported recent elevation of BP with SBP in the 180s.  He increased hydralazine to 100 mg 3 times daily with improved SBP to the 130s.  BP at the time of his visit was 138/48 patient was cautioned to not titrate medications further due to low DBP.     History of Present Illness    Today, patient reports continued elevated BP. Home BP readings consistently 160-180s/60s. He even changed cuffs and was still getting high readings. He denies headaches, lightheadedness, dizziness or vision changes. Patient denies shortness of breath, dyspnea on exertion, lower extremity edema, orthopnea or PND. No chest pain, pressure, or tightness. No palpitations.      ROS: All other systems reviewed and are otherwise negative except as noted in History of Present Illness.  EKGs/Labs Reviewed       09/09/2023: ALT 8; AST 14 11/23/2023: BUN 46; Creatinine 3.43; Potassium 3.7; Sodium 137   11/23/2023: WBC 3.6 01/04/2024: Hemoglobin 8.0   Risk Assessment/Calculations      HYPERTENSION CONTROL Vitals:   01/24/24 1509 01/24/24 1707  BP: (!) 172/58 (!) 160/60    The patient's  blood pressure is elevated above target today.  In order to address the patient's elevated BP: A new medication was prescribed today.           Physical Exam    VS:  BP (!) 160/60 (BP Location: Left Arm, Patient Position: Sitting, Cuff Size:  Normal)   Pulse 61   Ht 5\' 10"  (1.778 m)   Wt 156 lb (70.8 kg)   SpO2 98%   BMI 22.38 kg/m  , BMI Body mass index is 22.38 kg/m.  GEN: Well nourished, well developed, in no acute distress. Neck: No JVD or carotid bruits. Cardiac:  RRR. No murmurs. No rubs or gallops.   Respiratory:  Respirations regular and unlabored. Clear to auscultation without rales, wheezing or rhonchi. GI: Soft, nontender, nondistended. Extremities: Radials/DP/PT 2+ and equal bilaterally. No clubbing or cyanosis. No edema.  Skin: Warm and dry, no rash. Neuro: Strength intact.  Assessment & Plan   CAD Coronary CTA with FFR December 2020 showed calcium score of 1689, FFR analysis showed significant stenosis of the distal segment of the LCx which was a small vessel, GDMT was recommended.  Patient denies chest pain, pressure or tightness.  -Continue amlodipine, carvedilol.  Hypertension BP today 172/58 on intake and 160/60 on my recheck. Home BP consistently in the 160-180s/60s. Medications are limited secondary to CKD stage IV and low DBP.  He denies headaches, dizziness, or changes in vision.  -Add isosorbide 30 mg daily.   -Continue amlodipine, carvedilol, hydralazine. -Continue to monitor BP at home.   Carotid artery stenosis Carotid duplex October 2022 showed bilateral ICA 40 to 59%, bilateral ECA > 50%. Patient denies lightheadedness, dizziness, vision changes, presyncope or syncope.  -Continue Zetia.  Hyperlipidemia Patient has an appointment to get labs drawn for PCP tomorrow. He believes his lipids will be checked at that time.  -Have PCP send lab results.  -Continue Zetia.  Disposition: Add isosorbide. Return in 4 weeks or sooner as needed.          Signed, Etta Grandchild. Kayshaun Polanco, DNP, NP-C

## 2024-01-24 ENCOUNTER — Encounter: Payer: Self-pay | Admitting: Student

## 2024-01-24 ENCOUNTER — Other Ambulatory Visit: Payer: Self-pay

## 2024-01-24 ENCOUNTER — Ambulatory Visit: Payer: Medicare PPO | Attending: Student | Admitting: Student

## 2024-01-24 VITALS — BP 160/60 | HR 61 | Ht 70.0 in | Wt 156.0 lb

## 2024-01-24 DIAGNOSIS — I2510 Atherosclerotic heart disease of native coronary artery without angina pectoris: Secondary | ICD-10-CM

## 2024-01-24 DIAGNOSIS — I10 Essential (primary) hypertension: Secondary | ICD-10-CM

## 2024-01-24 DIAGNOSIS — I6523 Occlusion and stenosis of bilateral carotid arteries: Secondary | ICD-10-CM | POA: Diagnosis not present

## 2024-01-24 DIAGNOSIS — E785 Hyperlipidemia, unspecified: Secondary | ICD-10-CM

## 2024-01-24 DIAGNOSIS — D649 Anemia, unspecified: Secondary | ICD-10-CM

## 2024-01-24 MED ORDER — ISOSORBIDE MONONITRATE ER 30 MG PO TB24: 30.0000 mg | ORAL_TABLET | Freq: Every day | ORAL | 3 refills | Status: DC

## 2024-01-24 NOTE — Patient Instructions (Addendum)
 Medication Instructions:  Your physician recommends the following medication changes.  START TAKING: Isosorbide mononitrate 30 mg daily  *If you need a refill on your cardiac medications before your next appointment, please call your pharmacy*   Lab Work: None ordered at this time    Follow-Up: At [facility], you and your health needs are our priority.  As part of our continuing mission to provide you with exceptional heart care, we have created designated Provider Care Teams.  These Care Teams include your primary Cardiologist (physician) and Advanced Practice Providers (APPs -  Physician Assistants and Nurse Practitioners) who all work together to provide you with the care you need, when you need it.   Your next appointment:   4 week(s)  Provider:   You may see Debbe Odea, MD or Carlos Levering, NP   Other Instructions Please record daily blood pressure in your log

## 2024-01-24 NOTE — Telephone Encounter (Signed)
 Requested medication (s) are due for refill today: yes  Requested medication (s) are on the active medication list: yes  Last refill:  12/26/23  Future visit scheduled: no  Notes to clinic:  Unable to refill per protocol, courtesy refill already given, routing for provider approval.      Requested Prescriptions  Pending Prescriptions Disp Refills   hydrALAZINE (APRESOLINE) 50 MG tablet [Pharmacy Med Name: HYDRALAZINE 50 MG TABLET] 360 tablet 1    Sig: TAKE 50 MG IN THE MORNING AND AT MIDDAY, 100 MG AT NIGHT     Cardiovascular:  Vasodilators Failed - 01/24/2024 10:37 AM      Failed - HCT in normal range and within 360 days    HCT  Date Value Ref Range Status  01/04/2024 24.2 (L) 39.0 - 52.0 % Final    Comment:    Performed at [facility], [address] [place], [place] [address]   Hematocrit  Date Value Ref Range Status  04/26/2023 24.3 (L) 37.5 - 51.0 % Final         Failed - HGB in normal range and within 360 days    Hemoglobin  Date Value Ref Range Status  01/04/2024 8.0 (L) 13.0 - 17.0 g/dL Final  29/52/8413 8.2 (L) 13.0 - 17.7 g/dL Final         Failed - RBC in normal range and within 360 days    RBC  Date Value Ref Range Status  11/23/2023 3.48 (L) 4.22 - 5.81 MIL/uL Final         Failed - WBC in normal range and within 360 days    WBC  Date Value Ref Range Status  11/23/2023 3.6 (L) 4.0 - 10.5 K/uL Final         Failed - PLT in normal range and within 360 days    Platelets  Date Value Ref Range Status  11/23/2023 141 (L) 150 - 400 K/uL Final  04/26/2023 217 150 - 450 x10E3/uL Final   Platelet Count  Date Value Ref Range Status  09/09/2023 213 150 - 400 K/uL Final         Failed - ANA Screen, Ifa, Serum in normal range and within 360 days    No results found for: "ANA", "ANATITER", "LABANTI"       Failed - Last BP in normal range    BP Readings from Last 1 Encounters:  01/09/24 (!) 179/57         Passed - Valid encounter within last 12  months    Recent Outpatient Visits           5 months ago Fever in other diseases   [place] [facility] Leith-Hatfield, [place], MD   9 months ago Acute non-recurrent frontal sinusitis   [place] [facility] Alfredia Ferguson, PA-C   1 year ago Encounter for annual wellness exam in Medicare patient   [facility] Alfredia Ferguson, PA-C   1 year ago Restless leg   [place] [facility] Alfredia Ferguson, PA-C   1 year ago Depression, major, single episode, severe [facility])   [place] [facility] Alfredia Ferguson, [place]       Future Appointments             Today Carlos Levering, NP  [facility] [place]   In 3 months Stoioff, Verna Czech, MD [facility] [place]

## 2024-01-25 ENCOUNTER — Inpatient Hospital Stay: Payer: Medicare PPO

## 2024-01-25 ENCOUNTER — Inpatient Hospital Stay: Payer: Medicare PPO | Attending: Internal Medicine

## 2024-01-25 ENCOUNTER — Other Ambulatory Visit: Payer: Self-pay

## 2024-01-25 VITALS — BP 138/49

## 2024-01-25 DIAGNOSIS — N184 Chronic kidney disease, stage 4 (severe): Secondary | ICD-10-CM | POA: Diagnosis present

## 2024-01-25 DIAGNOSIS — E1122 Type 2 diabetes mellitus with diabetic chronic kidney disease: Secondary | ICD-10-CM | POA: Insufficient documentation

## 2024-01-25 DIAGNOSIS — I129 Hypertensive chronic kidney disease with stage 1 through stage 4 chronic kidney disease, or unspecified chronic kidney disease: Secondary | ICD-10-CM | POA: Insufficient documentation

## 2024-01-25 DIAGNOSIS — D649 Anemia, unspecified: Secondary | ICD-10-CM

## 2024-01-25 DIAGNOSIS — D631 Anemia in chronic kidney disease: Secondary | ICD-10-CM | POA: Insufficient documentation

## 2024-01-25 LAB — HEMOGLOBIN AND HEMATOCRIT (CANCER CENTER ONLY)
HCT: 26.9 % — ABNORMAL LOW (ref 39.0–52.0)
Hemoglobin: 8.6 g/dL — ABNORMAL LOW (ref 13.0–17.0)

## 2024-01-25 MED ORDER — EPOETIN ALFA-EPBX 40000 UNIT/ML IJ SOLN
40000.0000 [IU] | Freq: Once | INTRAMUSCULAR | Status: AC
  Administered 2024-01-25: 40000 [IU] via SUBCUTANEOUS
  Filled 2024-01-25: qty 1

## 2024-01-31 ENCOUNTER — Other Ambulatory Visit: Payer: Self-pay | Admitting: *Deleted

## 2024-01-31 DIAGNOSIS — R972 Elevated prostate specific antigen [PSA]: Secondary | ICD-10-CM

## 2024-02-01 ENCOUNTER — Other Ambulatory Visit

## 2024-02-01 DIAGNOSIS — R972 Elevated prostate specific antigen [PSA]: Secondary | ICD-10-CM

## 2024-02-02 ENCOUNTER — Encounter: Payer: Self-pay | Admitting: *Deleted

## 2024-02-02 LAB — PSA: Prostate Specific Ag, Serum: 11.8 ng/mL — ABNORMAL HIGH (ref 0.0–4.0)

## 2024-02-15 ENCOUNTER — Inpatient Hospital Stay: Payer: Medicare PPO

## 2024-02-15 ENCOUNTER — Ambulatory Visit
Admission: RE | Admit: 2024-02-15 | Discharge: 2024-02-15 | Disposition: A | Discharge status: Home / Self Care | Payer: Medicare PPO | Source: Ambulatory Visit | Attending: Internal Medicine | Admitting: Internal Medicine

## 2024-02-15 ENCOUNTER — Inpatient Hospital Stay: Payer: Medicare PPO | Attending: Internal Medicine

## 2024-02-15 VITALS — BP 136/49 | HR 56 | Temp 96.6°F | Resp 20

## 2024-02-15 DIAGNOSIS — D649 Anemia, unspecified: Secondary | ICD-10-CM

## 2024-02-15 DIAGNOSIS — I129 Hypertensive chronic kidney disease with stage 1 through stage 4 chronic kidney disease, or unspecified chronic kidney disease: Secondary | ICD-10-CM | POA: Insufficient documentation

## 2024-02-15 DIAGNOSIS — N184 Chronic kidney disease, stage 4 (severe): Secondary | ICD-10-CM | POA: Insufficient documentation

## 2024-02-15 DIAGNOSIS — E1122 Type 2 diabetes mellitus with diabetic chronic kidney disease: Secondary | ICD-10-CM | POA: Insufficient documentation

## 2024-02-15 DIAGNOSIS — R911 Solitary pulmonary nodule: Secondary | ICD-10-CM | POA: Diagnosis present

## 2024-02-15 DIAGNOSIS — D631 Anemia in chronic kidney disease: Secondary | ICD-10-CM | POA: Insufficient documentation

## 2024-02-15 LAB — HEMOGLOBIN AND HEMATOCRIT (CANCER CENTER ONLY)
HCT: 31.6 % — ABNORMAL LOW (ref 39.0–52.0)
Hemoglobin: 10 g/dL — ABNORMAL LOW (ref 13.0–17.0)

## 2024-02-15 MED ORDER — EPOETIN ALFA-EPBX 40000 UNIT/ML IJ SOLN
40000.0000 [IU] | Freq: Once | INTRAMUSCULAR | Status: AC
  Administered 2024-02-15: 40000 [IU] via SUBCUTANEOUS
  Filled 2024-02-15: qty 1

## 2024-02-21 NOTE — Progress Notes (Unsigned)
 Cardiology Clinic Note   Date: 02/23/2024 ID: Nicholas Nolan, DOB [birth year], MRN [id number]  Primary Cardiologist:  Debbe Odea, MD  Chief Complaint   Nicholas Nolan is a 73 y.o. male who presents to the clinic today for follow up after medication changes.   Patient Profile   Nicholas Nolan is followed by Dr. Azucena Cecil for the history outlined below.       Past medical history significant for: CAD Coronary CTA with FFR 10/11/2019: Coronary calcium score 1689.  Severe calcific plaques in the mid LAD and RCA.  FFR analysis showed significant stenosis of the distal segment of the LCx which will is a small vessel <2 mm in diameter.  Recommend GDMT. Chronic DOE. Echo 08/24/2019: EF 60 to 65%.  No RWMA.  Mild LVH.  Grade I DD.  Normal RV size/function. Hypertension. Renal artery duplex 06/01/2019: Elevated peak systolic velocity in the mid segment of the right renal artery concerning for hemodynamically significant renal artery stenosis.  No evidence of significant left renal artery stenosis. Renal angiography 07/02/2019: Relatively normal aorta and iliac arteries without significant stenosis.  Neither renal artery more than about 15% stenosis on selective imaging. Hyperlipidemia. Lipid panel 01/25/2024: LDL 100, HDL 49, TG 100, total 169. PAD. LEA [place] 08/11/2020: Right: Atherosclerosis of the common femoral, femoral, and popliteal and tibial arteries.  Elevated velocities throughout the right lower extremity with 75 to 99% stenosis in the mid SFA.  Left: Atherosclerosis of the common femoral, femoral, and popliteal and tibial arteries.  Elevated velocities throughout the left lower extremity with a short segment occlusion in the mid SFA which reconstitutes mid/distal SFA. ABI 08/10/2021: Moderate bilateral lower extremity arterial disease. Carotid artery stenosis.  Carotid duplex 08/10/2021: Bilateral ICA 40 to 59%, bilateral ECA > 50%. OSA. Lung  cancer. Hypothyroidism. T2DM. CKD stage IV.  In summary, patient was first evaluated by Dr. Azucena Cecil on 07/24/2019 for resistant hypertension and DOE.  Echo demonstrated normal LV/RV function as detailed above.  Renal artery duplex was concerning for right renal artery stenosis.  Patient was referred to vascular surgery and underwent renal angiography in August 2021 which demonstrated no significant renal artery artery stenosis as detailed above.  Coronary CTA with FFR in December 2020 demonstrated significant stenosis of the distal LCx which was a small vessel as detailed above, GDMT was recommended.  Patient was last seen in the office by Dr. Azucena Cecil on 08/05/2023 for routine follow-up.  Patient reported recent elevation of BP with SBP in the 180s.  He increased hydralazine to 100 mg 3 times daily with improved SBP to the 130s.  BP at the time of his visit was 138/48 patient was cautioned to not titrate medications further due to low DBP.   Patient was last seen in the office by me on 01/24/2024 for evaluation of elevated BP. Home BP readings were consistently 160-180/60. BP at the time of his visit was 172/58 on intake and 160/60 on my recheck. Isosorbide was added.      History of Present Illness    Today, patient is doing well. He reports BP in the morning prior to medications is elevated. In the afternoon is when his BP is best controlled. In the evening BP is "sky high" with readings 160-190/70s. He denies headaches or dizziness with elevated BP. He is otherwise doing well. Patient denies lower extremity edema, orthopnea or PND. Chronic DOE is at baseline. No chest pain, pressure, or tightness. No palpitations.  He is active gardening  and performing other household tasks.     ROS: All other systems reviewed and are otherwise negative except as noted in History of Present Illness.  EKGs/Labs Reviewed       EKG is not ordered today.   09/09/2023: ALT 8; AST 14 11/23/2023: BUN 46;  Creatinine 3.43; Potassium 3.7; Sodium 137   11/23/2023: WBC 3.6 02/15/2024: Hemoglobin 10.0    Physical Exam    VS:  BP 118/60 (BP Location: Left Arm)   Pulse 65   Ht 5\' 10"  (1.778 m)   Wt 155 lb (70.3 kg)   SpO2 96%   BMI 22.24 kg/m  , BMI Body mass index is 22.24 kg/m.  GEN: Well nourished, well developed, in no acute distress. Neck: No JVD or carotid bruits. Cardiac:  RRR. No murmurs. No rubs or gallops.   Respiratory:  Respirations regular and unlabored. Clear to auscultation without rales, wheezing or rhonchi. GI: Soft, nontender, nondistended. Extremities: Radials/DP/PT 2+ and equal bilaterally. No clubbing or cyanosis. No edema.  Skin: Warm and dry, no rash. Neuro: Strength intact.  Assessment & Plan   CAD Coronary CTA with FFR December 2020 showed calcium score of 1689, FFR analysis showed significant stenosis of the distal segment of the LCx which was a small vessel, GDMT was recommended.  Patient denies chest pain, pressure or tightness.  -Continue amlodipine, carvedilol, isosorbide.    Hypertension BP today 118/60. Home BP is elevated in the morning prior to medications, well controlled in the afternoon and high (160-190/70s) in the evenings. Medications are limited secondary to CKD stage IV and low DBP.   -Continue amlodipine, carvedilol, hydralazine. -Increase isosorbide to 30 mg bid.    Carotid artery stenosis Carotid duplex October 2022 showed bilateral ICA 40 to 59%, bilateral ECA > 50%. Patient denies lightheadedness, dizziness, vision changes, presyncope or syncope.  -Continue Zetia.   Hyperlipidemia LDL 100 March 2025, not at goal. -Continue Zetia.  Disposition: Return in 3 months or sooner as needed.          Signed, Lonell Rives. Aaryav Hopfensperger, DNP, NP-C

## 2024-02-22 ENCOUNTER — Ambulatory Visit
Admission: RE | Admit: 2024-02-22 | Discharge: 2024-02-22 | Disposition: A | Discharge status: Home / Self Care | Payer: Medicare PPO | Source: Ambulatory Visit | Attending: Family Medicine | Admitting: Family Medicine

## 2024-02-22 DIAGNOSIS — E042 Nontoxic multinodular goiter: Secondary | ICD-10-CM | POA: Diagnosis present

## 2024-02-23 ENCOUNTER — Encounter: Payer: Self-pay | Admitting: Student

## 2024-02-23 ENCOUNTER — Ambulatory Visit: Attending: Student | Admitting: Student

## 2024-02-23 VITALS — BP 118/60 | HR 65 | Ht 70.0 in | Wt 155.0 lb

## 2024-02-23 DIAGNOSIS — I6523 Occlusion and stenosis of bilateral carotid arteries: Secondary | ICD-10-CM | POA: Diagnosis not present

## 2024-02-23 DIAGNOSIS — I2510 Atherosclerotic heart disease of native coronary artery without angina pectoris: Secondary | ICD-10-CM | POA: Diagnosis not present

## 2024-02-23 DIAGNOSIS — I10 Essential (primary) hypertension: Secondary | ICD-10-CM | POA: Diagnosis not present

## 2024-02-23 DIAGNOSIS — E785 Hyperlipidemia, unspecified: Secondary | ICD-10-CM | POA: Diagnosis not present

## 2024-02-23 MED ORDER — ISOSORBIDE MONONITRATE ER 30 MG PO TB24: 30.0000 mg | ORAL_TABLET | Freq: Two times a day (BID) | ORAL | 3 refills | Status: AC

## 2024-02-23 NOTE — Patient Instructions (Signed)
 Medication Instructions:  Your physician has recommended you make the following change in your medication:   -Increase isosorbide mononitrate 30mg  twice daily (take second dose with dinner).  *If you need a refill on your cardiac medications before your next appointment, please call your pharmacy*   Follow-Up: At [facility], you and your health needs are our priority.  As part of our continuing mission to provide you with exceptional heart care, our providers are all part of one team.  This team includes your primary Cardiologist (physician) and Advanced Practice Providers or APPs (Physician Assistants and Nurse Practitioners) who all work together to provide you with the care you need, when you need it.  Your next appointment:   3 month(s)  Provider:   Constancia Delton, MD or Morey Ar, NP    We recommend signing up for the patient portal called "MyChart".  Sign up information is provided on this After Visit Summary.  MyChart is used to connect with patients for Virtual Visits (Telemedicine).  Patients are able to view lab/test results, encounter notes, upcoming appointments, etc.  Non-urgent messages can be sent to your provider as well.   To learn more about what you can do with MyChart, go to [email or web address].

## 2024-02-27 ENCOUNTER — Encounter: Payer: Self-pay | Admitting: Family Medicine

## 2024-03-07 ENCOUNTER — Inpatient Hospital Stay: Payer: Medicare PPO

## 2024-03-07 ENCOUNTER — Inpatient Hospital Stay: Payer: Medicare PPO | Admitting: Internal Medicine

## 2024-03-07 NOTE — Assessment & Plan Note (Deleted)
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   Proceed with venofer   today.   # Mild leukopenia white count 3.2 ANC-1.8; thrombocytopenia- platlets 144- Asymptomatic. stable  #Hypertension-poorly controlled-160 s systolic- better/stable; recommend checking BP  regularly.   # CKD stage IV- [DM/HTN; Dr.Korrapati]-follow-up- GFR 18- stable  # DM [Hb A1C-5.4]- trulicity/ SGLT2 inhbitors- stable  # CHF- chronic [Dr.Etang]- stable  # 6 mm Left lung nodule; Prior Hx of Lung cancer Right lung [s/p Lobectomy- 2014? [facility]]-   Oct 2024. NOV 2024- CT chest Slight increase in atelectasis and reticulonodular changes along the left lung base. Slight increase in lung base ground-glass as well, nonspecific. Stable juxtapleural 6 mm left-sided lung nodule. Recommend continued follow up in 6 months.  Ordered a CT scan end of April/May 2025.   Jan 2025-Q 2 w to 3 w  # DISPOSITION:  #  venofer  today; HOLD Retacrit  # Follow up in 3 weeks- labs- H&H-possible retacrit - # Follow up in 6 weeks- labs- H&H-possible retacrit -  # Follow up in 9 weeks- labs- H&H-possible retacrit -  # Follow up in 12 weeks- labs- H&H-possible retacrit -  # Follow up in 15 weeks- MD: labs- cbc;bmp; LDH; iron  studies; ferritin; possible venofer  OR retacrit ; CT chest prior Dr.B

## 2024-03-24 ENCOUNTER — Other Ambulatory Visit: Payer: Self-pay | Admitting: Family Medicine

## 2024-03-24 DIAGNOSIS — G4700 Insomnia, unspecified: Secondary | ICD-10-CM

## 2024-03-27 ENCOUNTER — Encounter (INDEPENDENT_AMBULATORY_CARE_PROVIDER_SITE_OTHER): Payer: Self-pay

## 2024-03-28 ENCOUNTER — Other Ambulatory Visit (INDEPENDENT_AMBULATORY_CARE_PROVIDER_SITE_OTHER): Payer: Self-pay | Admitting: Nurse Practitioner

## 2024-03-28 DIAGNOSIS — N186 End stage renal disease: Secondary | ICD-10-CM

## 2024-03-30 ENCOUNTER — Ambulatory Visit (INDEPENDENT_AMBULATORY_CARE_PROVIDER_SITE_OTHER)

## 2024-03-30 ENCOUNTER — Ambulatory Visit (INDEPENDENT_AMBULATORY_CARE_PROVIDER_SITE_OTHER): Admitting: Nurse Practitioner

## 2024-03-30 ENCOUNTER — Encounter (INDEPENDENT_AMBULATORY_CARE_PROVIDER_SITE_OTHER): Payer: Self-pay | Admitting: Nurse Practitioner

## 2024-03-30 VITALS — BP 133/58 | HR 55 | Resp 16 | Wt 156.0 lb

## 2024-03-30 DIAGNOSIS — N184 Chronic kidney disease, stage 4 (severe): Secondary | ICD-10-CM

## 2024-03-30 DIAGNOSIS — E1122 Type 2 diabetes mellitus with diabetic chronic kidney disease: Secondary | ICD-10-CM | POA: Diagnosis not present

## 2024-03-30 DIAGNOSIS — E1169 Type 2 diabetes mellitus with other specified complication: Secondary | ICD-10-CM

## 2024-03-30 DIAGNOSIS — N186 End stage renal disease: Secondary | ICD-10-CM

## 2024-03-30 DIAGNOSIS — E785 Hyperlipidemia, unspecified: Secondary | ICD-10-CM | POA: Diagnosis not present

## 2024-04-02 ENCOUNTER — Encounter (INDEPENDENT_AMBULATORY_CARE_PROVIDER_SITE_OTHER): Payer: Self-pay | Admitting: Nurse Practitioner

## 2024-04-02 NOTE — Progress Notes (Signed)
 Subjective:    Patient ID: Nicholas Nolan, male    DOB: 23-Oct-1951, 73 y.o.   MRN: [id number] Chief Complaint  Patient presents with   Follow-up    Ref Zelda Hickman consult AV fistula placement    The patient is seen for evaluation for dialysis access. The patient has chronic renal insufficiency stage V secondary to hypertension. The patient's most recent creatinine clearance is less than 20. The patient volume status has not yet become an issue. Patient's blood pressures been relatively well controlled. There are mild uremic symptoms which appear to be relatively well tolerated at this time.  The patient notes the kidney problem has been present for a long time and has been progressively getting worse.  The patient is followed by nephrology.    The patient is right-handed.  The patient has been considering the various methods of dialysis and wishes to proceed with hemodialysis and therefore creation of AV access is indicated.  No recent shortening of the patient's walking distance or new symptoms consistent with claudication.  No history of rest pain symptoms. No new ulcers or wounds of the lower extremities have occurred.  The patient denies amaurosis fugax or recent TIA symptoms. There are no recent neurological changes noted. There is no history of DVT, PE or superficial thrombophlebitis. No recent episodes of angina or shortness of breath documented.     Review of Systems  All other systems reviewed and are negative.      Objective:    Physical Exam Vitals reviewed.  HENT:     Head: Normocephalic.  Cardiovascular:     Rate and Rhythm: Normal rate.     Pulses: Normal pulses.  Pulmonary:     Effort: Pulmonary effort is normal.  Skin:    General: Skin is warm and dry.  Neurological:     Mental Status: He is alert and oriented to person, place, and time.  Psychiatric:        Mood and Affect: Mood normal.        Behavior: Behavior normal.        Thought Content: Thought  content normal.        Judgment: Judgment normal.     BP (!) 133/58   Pulse (!) 55   Resp 16   Wt 156 lb (70.8 kg)   BMI 22.38 kg/m   Past Medical History:  Diagnosis Date   Arthritis    hands   Back pain    leg pain   CAD (coronary artery disease)    a. 10/2019 Cor CTA: LM nl, LAD nl, LCX distal dzs w/ FFR 0.65-0.75. RCA nl-->Med Rx.   CKD (chronic kidney disease), stage III (HCC)    Claudication (HCC)    Diabetes mellitus    type 2   Diastolic dysfunction    a. 08/2019 Echo: EF 60-65%, mild LVH, impaired relaxation. Nl RV size/fxn. Nl LA szie.   History of urinary retention    Hyperlipidemia    Hypertension    a. 05/2019 renal u/s concerning for R RAS; b. 06/2019 Renal artery angio: no more than15% bilat RAS.   Neuropathy    feet   Primary cancer of right upper lobe of lung (HCC) 2014   [facility] Lobectomy   Symptomatic anemia    Wears dentures    full upper    Social History   Socioeconomic History   Marital status: Married    Spouse name: Not on file   Number of children: 4  Years of education: Not on file   Highest education level: Associate degree: occupational, Scientist, product/process development, or vocational program  Occupational History   Occupation: retired  Tobacco Use   Smoking status: Former    Current packs/day: 0.00    Average packs/day: 2.0 packs/day for 45.0 years (90.0 ttl pk-yrs)    Types: Cigarettes    Start date: 11/29/1967    Quit date: 11/28/2012    Years since quitting: 11.3   Smokeless tobacco: Current    Types: Snuff  Vaping Use   Vaping status: Never Used  Substance and Sexual Activity   Alcohol use: No   Drug use: No   Sexual activity: Yes    Birth control/protection: None  Other Topics Concern   Not on file  Social History Narrative   Lives in with wife; daughter; mother [alzheimer]; quit smoking- 2015 [lung cancer dx]; no alcohol; retd- air Psychiatric nurse.    Social Drivers of Corporate investment banker Strain: Low Risk  (01/12/2023)   Overall  Financial Resource Strain (CARDIA)    Difficulty of Paying Living Expenses: Not hard at all  Food Insecurity: No Food Insecurity (02/25/2022)   Hunger Vital Sign    Worried About Running Out of Food in the Last Year: Never true    Ran Out of Food in the Last Year: Never true  Transportation Needs: No Transportation Needs (01/12/2023)   PRAPARE - Administrator, Civil Service (Medical): No    Lack of Transportation (Non-Medical): No  Physical Activity: Inactive (01/12/2023)   Exercise Vital Sign    Days of Exercise per Week: 0 days    Minutes of Exercise per Session: 0 min  Stress: Stress Concern Present (01/12/2023)   [employer] of Occupational Health - Occupational Stress Questionnaire    Feeling of Stress : To some extent  Social Connections: Moderately Isolated (01/12/2023)   Social Connection and Isolation Panel [NHANES]    Frequency of Communication with Friends and Family: More than three times a week    Frequency of Social Gatherings with Friends and Family: More than three times a week    Attends Religious Services: Never    Database administrator or Organizations: No    Attends Banker Meetings: Never    Marital Status: Married  Catering manager Violence: Not At Risk (01/12/2023)   Humiliation, Afraid, Rape, and Kick questionnaire    Fear of Current or Ex-Partner: No    Emotionally Abused: No    Physically Abused: No    Sexually Abused: No    Past Surgical History:  Procedure Laterality Date   BASAL CELL CARCINOMA EXCISION     CATARACT EXTRACTION W/PHACO Left 12/15/2023   Procedure: CATARACT EXTRACTION PHACO AND INTRAOCULAR LENS PLACEMENT (IOC) LEFT DIABETIC;  Surgeon: Trudi Fus, MD;  Location: [facility];  Service: Ophthalmology;  Laterality: Left;  5.56 0:38.4   CATARACT EXTRACTION W/PHACO Right 01/09/2024   Procedure: CATARACT EXTRACTION PHACO AND INTRAOCULAR LENS PLACEMENT (IOC) RIGHT DIABETIC 6.18 00:40.1;  Surgeon: Trudi Fus, MD;  Location: [facility];  Service: Ophthalmology;  Laterality: Right;   COLONOSCOPY WITH PROPOFOL  N/A 12/25/2019   Procedure: COLONOSCOPY WITH PROPOFOL ;  Surgeon: Selena Daily, MD;  Location: [facility];  Service: Endoscopy;  Laterality: N/A;  Diabetic - injectable and oral meds   COLONOSCOPY WITH PROPOFOL  N/A 10/08/2021   Procedure: COLONOSCOPY WITH PROPOFOL ;  Surgeon: Luke Salaam, MD;  Location: [facility] ENDOSCOPY;  Service: Gastroenterology;  Laterality:  N/A;   LUMBAR LAMINECTOMY/DECOMPRESSION MICRODISCECTOMY  01/10/2012   Procedure: LUMBAR LAMINECTOMY/DECOMPRESSION MICRODISCECTOMY 2 LEVELS;  Surgeon: Corrina Dimitri, MD;  Location: [facility] NEURO ORS;  Service: Neurosurgery;  Laterality: Bilateral;  Lumbar four-Sacral One laminectomies   LUNG SURGERY Right 11/22/12   right upper lobe   POLYPECTOMY N/A 12/25/2019   Procedure: POLYPECTOMY;  Surgeon: Selena Daily, MD;  Location: [facility];  Service: Endoscopy;  Laterality: N/A;   PROSTATE SURGERY     biopsy-due to elelvated PSA   RENAL ANGIOGRAPHY Right 07/02/2019   Procedure: RENAL ANGIOGRAPHY;  Surgeon: Celso College, MD;  Location: [facility] INVASIVE CV LAB;  Service: Cardiovascular;  Laterality: Right;   SHOULDER SURGERY     VASECTOMY      Family History  Problem Relation Age of Onset   Cancer Father        prostate   Heart disease Father        MI   Stroke Father    Cancer Brother        liver    Allergies  Allergen Reactions   Codeine Itching    And hyper also   Hydromorphone  Itching   Morphine  And Codeine Itching       Latest Ref Rng & Units 02/15/2024    2:39 PM 01/25/2024    2:14 PM 01/04/2024    2:45 PM  CBC  Hemoglobin 13.0 - 17.0 g/dL 40.9  8.6  8.0   Hematocrit 39.0 - 52.0 % 31.6  26.9  24.2        CMP     Component Value Date/Time   NA 137 11/23/2023 0831   NA 140 04/26/2023 1407   K 3.7 11/23/2023 0831   CL 103 11/23/2023 0831   CO2 25 11/23/2023 0831    GLUCOSE 165 (H) 11/23/2023 0831   BUN 46 (H) 11/23/2023 0831   BUN 56 (H) 04/26/2023 1407   CREATININE 3.43 (H) 11/23/2023 0831   CALCIUM  8.4 (L) 11/23/2023 0831   PROT 6.0 (L) 09/09/2023 1236   PROT 5.6 (L) 04/26/2023 1407   ALBUMIN 3.3 (L) 09/09/2023 1236   ALBUMIN 3.5 (L) 04/26/2023 1407   AST 14 (L) 09/09/2023 1236   ALT 8 09/09/2023 1236   ALKPHOS 66 09/09/2023 1236   BILITOT 0.3 09/09/2023 1236   EGFR 18 (L) 04/26/2023 1407   GFRNONAA 18 (L) 11/23/2023 0831     No results found.     Assessment & Plan:   1. Chronic kidney disease, stage 4 (severe) (HCC) (Primary) Recommend:  At this time the patient does not have appropriate extremity access for dialysis  Patient should have a left radiocephalic created.  The risks, benefits and alternative therapies were reviewed in detail with the patient.  All questions were answered.  The patient agrees to proceed with surgery.   The patient will follow up with me in the office after the surgery.  2. Type 2 diabetes mellitus with stage 4 chronic kidney disease, without long-term current use of insulin  (HCC) Continue hypoglycemic medications as already ordered, these medications have been reviewed and there are no changes at this time.  Hgb A1C to be monitored as already arranged by primary service  3. Hyperlipidemia associated with type 2 diabetes mellitus (HCC) Continue statin as ordered and reviewed, no changes at this time   Current Outpatient Medications on File Prior to Visit  Medication Sig Dispense Refill   amLODipine  (NORVASC ) 2.5 MG tablet Take 1 tablet (2.5 mg total) by mouth  daily. 90 tablet 1   carvedilol  (COREG ) 6.25 MG tablet Take 1 tablet (6.25 mg total) by mouth 2 (two) times daily. 180 tablet 1   Dulaglutide (TRULICITY) 1.5 MG/0.5ML SOPN Inject into the skin.     ezetimibe (ZETIA) 10 MG tablet Take 1 tablet by mouth daily.     FARXIGA 10 MG TABS tablet Take 10 mg by mouth every morning.     Ferrous Sulfate  (IRON  PO) Take by mouth daily.     finasteride  (PROSCAR ) 5 MG tablet Take 1 tablet (5 mg total) by mouth daily. 90 tablet 3   glucose blood (ONETOUCH VERIO) test strip      hydrALAZINE  (APRESOLINE ) 100 MG tablet Take 1 tablet (100 mg total) by mouth 3 (three) times daily. 270 tablet 2   isosorbide  mononitrate (IMDUR ) 30 MG 24 hr tablet Take 1 tablet (30 mg total) by mouth 2 (two) times daily with a meal. 180 tablet 3   rOPINIRole  (REQUIP ) 0.5 MG tablet TAKE 1 TABLET BY MOUTH AT BEDTIME. 90 tablet 1   tamsulosin  (FLOMAX ) 0.4 MG CAPS capsule Take 1 capsule (0.4 mg total) by mouth daily. 90 capsule 3   traZODone  (DESYREL ) 50 MG tablet TAKE 1/2 TO 1 TABLET BY MOUTH AT BEDTIME AS NEEDED FOR SLEEP 60 tablet 0   TRUEplus Lancets 28G MISC      No current facility-administered medications on file prior to visit.    There are no Patient Instructions on file for this visit. No follow-ups on file.   Justina Bertini E Rumaldo Difatta, NP

## 2024-04-13 ENCOUNTER — Inpatient Hospital Stay: Admitting: Internal Medicine

## 2024-04-13 ENCOUNTER — Inpatient Hospital Stay

## 2024-04-13 ENCOUNTER — Encounter: Payer: Self-pay | Admitting: Internal Medicine

## 2024-04-13 ENCOUNTER — Inpatient Hospital Stay: Attending: Internal Medicine

## 2024-04-13 VITALS — BP 131/51 | HR 57 | Resp 17

## 2024-04-13 VITALS — BP 128/57 | HR 67 | Temp 98.5°F | Resp 16 | Ht 70.0 in | Wt 159.8 lb

## 2024-04-13 DIAGNOSIS — D649 Anemia, unspecified: Secondary | ICD-10-CM

## 2024-04-13 DIAGNOSIS — D509 Iron deficiency anemia, unspecified: Secondary | ICD-10-CM | POA: Insufficient documentation

## 2024-04-13 DIAGNOSIS — E1122 Type 2 diabetes mellitus with diabetic chronic kidney disease: Secondary | ICD-10-CM | POA: Diagnosis not present

## 2024-04-13 DIAGNOSIS — Z7984 Long term (current) use of oral hypoglycemic drugs: Secondary | ICD-10-CM | POA: Insufficient documentation

## 2024-04-13 DIAGNOSIS — N184 Chronic kidney disease, stage 4 (severe): Secondary | ICD-10-CM | POA: Insufficient documentation

## 2024-04-13 DIAGNOSIS — Z87891 Personal history of nicotine dependence: Secondary | ICD-10-CM | POA: Diagnosis not present

## 2024-04-13 DIAGNOSIS — D631 Anemia in chronic kidney disease: Secondary | ICD-10-CM | POA: Insufficient documentation

## 2024-04-13 DIAGNOSIS — I130 Hypertensive heart and chronic kidney disease with heart failure and stage 1 through stage 4 chronic kidney disease, or unspecified chronic kidney disease: Secondary | ICD-10-CM | POA: Diagnosis present

## 2024-04-13 LAB — CBC WITH DIFFERENTIAL (CANCER CENTER ONLY)
Abs Immature Granulocytes: 0.02 10*3/uL (ref 0.00–0.07)
Basophils Absolute: 0 10*3/uL (ref 0.0–0.1)
Basophils Relative: 1 %
Eosinophils Absolute: 0.1 10*3/uL (ref 0.0–0.5)
Eosinophils Relative: 4 %
HCT: 26.7 % — ABNORMAL LOW (ref 39.0–52.0)
Hemoglobin: 8.5 g/dL — ABNORMAL LOW (ref 13.0–17.0)
Immature Granulocytes: 1 %
Lymphocytes Relative: 35 %
Lymphs Abs: 1.3 10*3/uL (ref 0.7–4.0)
MCH: 29.8 pg (ref 26.0–34.0)
MCHC: 31.8 g/dL (ref 30.0–36.0)
MCV: 93.7 fL (ref 80.0–100.0)
Monocytes Absolute: 0.2 10*3/uL (ref 0.1–1.0)
Monocytes Relative: 6 %
Neutro Abs: 2.1 10*3/uL (ref 1.7–7.7)
Neutrophils Relative %: 53 %
Platelet Count: 142 10*3/uL — ABNORMAL LOW (ref 150–400)
RBC: 2.85 MIL/uL — ABNORMAL LOW (ref 4.22–5.81)
RDW: 12.9 % (ref 11.5–15.5)
WBC Count: 3.8 10*3/uL — ABNORMAL LOW (ref 4.0–10.5)
nRBC: 0 % (ref 0.0–0.2)

## 2024-04-13 LAB — IRON AND TIBC
Iron: 93 ug/dL (ref 45–182)
Saturation Ratios: 36 % (ref 17.9–39.5)
TIBC: 262 ug/dL (ref 250–450)
UIBC: 169 ug/dL

## 2024-04-13 LAB — BASIC METABOLIC PANEL WITH GFR
Anion gap: 8 (ref 5–15)
BUN: 61 mg/dL — ABNORMAL HIGH (ref 8–23)
CO2: 26 mmol/L (ref 22–32)
Calcium: 8.2 mg/dL — ABNORMAL LOW (ref 8.9–10.3)
Chloride: 104 mmol/L (ref 98–111)
Creatinine, Ser: 4.68 mg/dL — ABNORMAL HIGH (ref 0.61–1.24)
GFR, Estimated: 12 mL/min — ABNORMAL LOW (ref >60)
Glucose, Bld: 171 mg/dL — ABNORMAL HIGH (ref 70–99)
Potassium: 4 mmol/L (ref 3.5–5.1)
Sodium: 138 mmol/L (ref 135–145)

## 2024-04-13 LAB — LACTATE DEHYDROGENASE: LDH: 137 U/L (ref 98–192)

## 2024-04-13 LAB — FERRITIN: Ferritin: 123 ng/mL (ref 24–336)

## 2024-04-13 MED ORDER — IRON SUCROSE 20 MG/ML IV SOLN
200.0000 mg | Freq: Once | INTRAVENOUS | Status: AC
  Administered 2024-04-13: 200 mg via INTRAVENOUS

## 2024-04-13 MED ORDER — SODIUM CHLORIDE 0.9% FLUSH
10.0000 mL | Freq: Once | INTRAVENOUS | Status: AC | PRN
  Administered 2024-04-13: 10 mL
  Filled 2024-04-13: qty 10

## 2024-04-13 NOTE — Progress Notes (Signed)
 Fatigue/weakness: YES-BP DROPPING, TAKEN OFF 2 BP MEDS Dyspena: NO Light headedness: AT TIMES DIZZINESS Blood in stool: NO  B/L cataract 12/15/23 left, 01/09/24 right, Dr. Ronette [facility].  U/S 1.5 weeks ago [facility] for port placement for dialysis.  Went to [place]  fishing since last visit. He has pictures.

## 2024-04-13 NOTE — Assessment & Plan Note (Signed)
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 5 HOLD retacrit  today.   Proceed with venofer   today.   # Mild leukopenia white count 3.2 ANC-1.8; thrombocytopenia- platlets 144- Asymptomatic. Stable  # CKD stage IV- [DM/HTN; Dr.Korrapati]-follow-up- GFR 12- progressive worse- awaiting fistula- stable  # DM [Hb A1C-5.4]- trulicity/ SGLT2 inhbitors- stable  # CHF- chronic [Dr.Etang]- stable  # 6 mm Left lung nodule; Prior Hx of Lung cancer Right lung [s/p Lobectomy- 2014? [facility]]-  APRIL 2025- CT chest   6 mm left-sided lung nodule- stable.  Recommend continued follow up in 12 months- in summer 2026-   Jan 2025-Q 2 w to 3 w  # DISPOSITION:  #  venofer  today; HOLD Retacrit  # Follow up in 3 weeks- labs- H&H-possible retacrit - # Follow up in 6 weeks- labs- H&H-possible retacrit -  # Follow up in 9 weeks- labs- H&H-possible retacrit -  # Follow up in 12 weeks- labs- H&H-possible retacrit -  # Follow up in 15 weeks- MD: labs- cbc;bmp; LDH; iron  studies; ferritin; possible venofer  OR retacrit ;  Dr.B

## 2024-04-13 NOTE — Addendum Note (Signed)
 Addended by: Vivi Grit on: 04/13/2024 03:22 PM   Modules accepted: Orders

## 2024-04-13 NOTE — Progress Notes (Signed)
  [facility]cer Center CONSULT NOTE  Patient Care Team: Mimi Alt, MD as PCP - General (Family Medicine) Constancia Delton, MD as PCP - Cardiology (Cardiology) Julia Oats, OD (Optometry) Solum, Nicolas Barren, MD as Physician Assistant (Endocrinology) Geraline Knapp, MD (Urology) Worthy Heads, MD as Consulting Physician (Nephrology) Selena Daily, MD as Consulting Physician (Gastroenterology) [address], [place] as Social Worker Gwyn Leos, MD as Consulting Physician (Internal Medicine)  CHIEF COMPLAINTS/PURPOSE OF CONSULTATION: ANEMIA  HEMATOLOGY HISTORY  # Hx of right Upper lobe lung cancer [[facility]]- no chemo/RT.   # ANEMIA[Hb; MCV-platelets- WBC; Iron  sat; ferritin;  GFR- IV CT/US ; EGD/colonoscopy-[2022- [facility]]  # CKD- IV[Dr.Korrapati]   Latest Reference Range & Units 08/13/22 14:04  Iron  38 - 169 ug/dL 67  UIBC 161 - 096 ug/dL 045  TIBC 409 - 811 ug/dL 914  Ferritin 30 - 782 ng/mL 28 (L)  Iron  Saturation 15 - 55 % 23  Vitamin B12 232 - 1,245 pg/mL 629  Globulin, Total 1.5 - 4.5 g/dL 2.2  WBC 3.4 - 95.6 O13Y8/MV 4.3  RBC 4.14 - 5.80 x10E6/uL 3.19 (L)  Hemoglobin 13.0 - 17.7 g/dL 9.7 (L)  HCT 78.4 - 69.6 % 28.8 (L)  MCV 79 - 97 fL 90  MCH 26.6 - 33.0 pg 30.4  MCHC 31.5 - 35.7 g/dL 29.5  RDW 28.4 - 13.2 % 12.5  Platelets 150 - 450 x10E3/uL 200  (L): Data is abnormally low  HISTORY OF PRESENTING ILLNESS: Alone alone.  Ambulating independently.  Nicholas Nolan 73 y.o.  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   In the interim- U/S 1.5 weeks ago [facility] for port placement for dialysis.  Chronic fatigue-  Denies any worsening shortness of breath. Patient denies any blood in stools  or black-colored stools.  Denies any nausea vomiting.   Continues to notice improvement of cold intolerance and restless legs.    Review of Systems  Constitutional:  Positive for malaise/fatigue. Negative for chills, diaphoresis, fever and weight loss.  HENT:  Negative for nosebleeds and sore throat.   Eyes:  Negative for double vision.  Respiratory:  Negative for cough, hemoptysis, sputum production and wheezing.   Cardiovascular:  Negative for chest pain, palpitations, orthopnea and leg swelling.  Gastrointestinal:  Negative for abdominal pain, blood in stool, constipation, diarrhea, heartburn, melena, nausea and vomiting.  Genitourinary:  Negative for dysuria, frequency and urgency.  Musculoskeletal:  Negative for back pain and joint pain.  Skin: Negative.  Negative for itching and rash.  Neurological:  Positive for tingling. Negative for dizziness, focal weakness, weakness and headaches.  Endo/Heme/Allergies:  Does not bruise/bleed easily.  Psychiatric/Behavioral:  Negative for depression. The patient is not nervous/anxious and does not have insomnia.      MEDICAL HISTORY:  Past Medical History:  Diagnosis Date   Arthritis    hands   Back pain    leg pain   CAD (coronary artery disease)    a. 10/2019 Cor CTA: LM nl, LAD nl, LCX distal dzs w/ FFR 0.65-0.75. RCA nl-->Med Rx.   CKD (chronic kidney disease), stage III (HCC)    Claudication (HCC)    Diabetes mellitus    type 2   Diastolic dysfunction    a. 08/2019 Echo: EF 60-65%, mild LVH, impaired relaxation. Nl  RV size/fxn. Nl LA szie.   History of urinary retention    Hyperlipidemia    Hypertension    a. 05/2019 renal u/s concerning for R RAS; b. 06/2019 Renal artery angio: no more than15% bilat RAS.   Neuropathy    feet   Primary cancer of right upper lobe of lung (HCC) 2014   RUL Lobectomy   Symptomatic anemia    Wears dentures    full upper    SURGICAL HISTORY: Past Surgical History:  Procedure Laterality Date    BASAL CELL CARCINOMA EXCISION     CATARACT EXTRACTION W/PHACO Left 12/15/2023   Procedure: CATARACT EXTRACTION PHACO AND INTRAOCULAR LENS PLACEMENT (IOC) LEFT DIABETIC;  Surgeon: Trudi Fus, MD;  Location: [facility];  Service: Ophthalmology;  Laterality: Left;  5.56 0:38.4   CATARACT EXTRACTION W/PHACO Right 01/09/2024   Procedure: CATARACT EXTRACTION PHACO AND INTRAOCULAR LENS PLACEMENT (IOC) RIGHT DIABETIC 6.18 00:40.1;  Surgeon: Trudi Fus, MD;  Location: [facility];  Service: Ophthalmology;  Laterality: Right;   COLONOSCOPY WITH PROPOFOL  N/A 12/25/2019   Procedure: COLONOSCOPY WITH PROPOFOL ;  Surgeon: Selena Daily, MD;  Location: [facility];  Service: Endoscopy;  Laterality: N/A;  Diabetic - injectable and oral meds   COLONOSCOPY WITH PROPOFOL  N/A 10/08/2021   Procedure: COLONOSCOPY WITH PROPOFOL ;  Surgeon: Luke Salaam, MD;  Location: [facility];  Service: Gastroenterology;  Laterality: N/A;   LUMBAR LAMINECTOMY/DECOMPRESSION MICRODISCECTOMY  01/10/2012   Procedure: LUMBAR LAMINECTOMY/DECOMPRESSION MICRODISCECTOMY 2 LEVELS;  Surgeon: Corrina Dimitri, MD;  Location: [facility];  Service: Neurosurgery;  Laterality: Bilateral;  Lumbar four-Sacral One laminectomies   LUNG SURGERY Right 11/22/12   right upper lobe   POLYPECTOMY N/A 12/25/2019   Procedure: POLYPECTOMY;  Surgeon: Selena Daily, MD;  Location: [facility];  Service: Endoscopy;  Laterality: N/A;   PROSTATE SURGERY     biopsy-due to elelvated PSA   RENAL ANGIOGRAPHY Right 07/02/2019   Procedure: RENAL ANGIOGRAPHY;  Surgeon: Celso College, MD;  Location: [facility] INVAS[facility] CV [facility];  Service: Cardiovascular;  Laterality: Right;   SHOULDER SURGERY     VASECTOMY      SOCIAL HISTORY: Social History   Socioeconomic History   Marital status: Married    Spouse name: Not on file   Number of children: 4   Years of education: Not on file   Highest education level:  Associate degree: occupational, Scientist, product/process development, or vocational program  Occupational History   Occupation: retired  Tobacco Use   Smoking status: Former    Current packs/day: 0.00    Average packs/day: 2.0 packs/day for 45.0 years (90.0 ttl pk-yrs)    Types: Cigarettes    Start date: 11/29/1967    Quit date: 11/28/2012    Years since quitting: 11.3   Smokeless tobacco: Current    Types: Snuff  Vaping Use   Vaping status: Never Used  Substance and Sexual Activity   Alcohol use: No   Drug use: No   Sexual activity: Yes    Birth control/protection: None  Other Topics Concern   Not on file  Social History Narrative   Lives in with wife; daughter; mother [alzheimer]; quit smoking- 2015 [lung cancer dx]; no alcohol; retd- air Psychiatric nurse.    Social Drivers of Corporate investment banker Strain: Low Risk  (01/12/2023)   Overall Financial Resource Strain (CARDIA)    Difficulty of Paying Living Expenses: Not hard at all  Food Insecurity: No Food Insecurity (02/25/2022)  Hunger Vital Sign    Worried About Running Out of Food in the Last Year: Never true    Ran Out of Food in the Last Year: Never true  Transportation Needs: No Transportation Needs (01/12/2023)   PRAPARE - Administrator, Civil Service (Medical): No    Lack of Transportation (Non-Medical): No  Physical Activity: Inactive (01/12/2023)   Exercise Vital Sign    Days of Exercise per Week: 0 days    Minutes of Exercise per Session: 0 min  Stress: Stress Concern Present (01/12/2023)   [employer] of Occupational Health - Occupational Stress Questionnaire    Feeling of Stress : To some extent  Social Connections: Moderately Isolated (01/12/2023)   Social Connection and Isolation Panel [NHANES]    Frequency of Communication with Friends and Family: More than three times a week    Frequency of Social Gatherings with Friends and Family: More than three times a week    Attends Religious Services: Never    Automotive engineer or Organizations: No    Attends Banker Meetings: Never    Marital Status: Married  Catering manager Violence: Not At Risk (01/12/2023)   Humiliation, Afraid, Rape, and Kick questionnaire    Fear of Current or Ex-Partner: No    Emotionally Abused: No    Physically Abused: No    Sexually Abused: No    FAMILY HISTORY: Family History  Problem Relation Age of Onset   Cancer Father        prostate   Heart disease Father        MI   Stroke Father    Cancer Brother        liver    ALLERGIES:  is allergic to codeine, hydromorphone , and morphine  and codeine.  MEDICATIONS:  Current Outpatient Medications  Medication Sig Dispense Refill   carvedilol  (COREG ) 6.25 MG tablet Take 1 tablet (6.25 mg total) by mouth 2 (two) times daily. 180 tablet 1   Dulaglutide (TRULICITY) 1.5 MG/0.5ML SOPN Inject into the skin once a week.     ezetimibe (ZETIA) 10 MG tablet Take 1 tablet by mouth daily.     FARXIGA 10 MG TABS tablet Take 10 mg by mouth every morning.     Ferrous Sulfate (IRON  PO) Take by mouth daily.     glucose blood (ONETOUCH VERIO) test strip      hydrALAZINE  (APRESOLINE ) 100 MG tablet Take 1 tablet (100 mg total) by mouth 3 (three) times daily. 270 tablet 2   isosorbide  mononitrate (IMDUR ) 30 MG 24 hr tablet Take 1 tablet (30 mg total) by mouth 2 (two) times daily with a meal. 180 tablet 3   rOPINIRole  (REQUIP ) 0.5 MG tablet TAKE 1 TABLET BY MOUTH AT BEDTIME. 90 tablet 1   tamsulosin  (FLOMAX ) 0.4 MG CAPS capsule Take 1 capsule (0.4 mg total) by mouth daily. 90 capsule 3   traZODone  (DESYREL ) 50 MG tablet TAKE 1/2 TO 1 TABLET BY MOUTH AT BEDTIME AS NEEDED FOR SLEEP 60 tablet 0   TRUEplus Lancets 28G MISC      No current facility-administered medications for this visit.     Aaron Aas  PHYSICAL EXAMINATION:   Vitals:   04/13/24 1422  BP: (!) 128/57  Pulse: 67  Resp: 16  Temp: 98.5 F (36.9 C)  SpO2: 99%    Filed Weights   04/13/24 1422  Weight: 159  lb 12.8 oz (72.5 kg)     Physical Exam Vitals and nursing  note reviewed.  HENT:     Head: Normocephalic and atraumatic.     Mouth/Throat:     Pharynx: Oropharynx is clear.  Eyes:     Extraocular Movements: Extraocular movements intact.     Pupils: Pupils are equal, round, and reactive to light.  Cardiovascular:     Rate and Rhythm: Normal rate and regular rhythm.  Pulmonary:     Comments: Decreased breath sounds bilaterally.  Abdominal:     Palpations: Abdomen is soft.  Musculoskeletal:        General: Normal range of motion.     Cervical back: Normal range of motion.  Skin:    General: Skin is warm.  Neurological:     General: No focal deficit present.     Mental Status: He is alert and oriented to person, place, and time.  Psychiatric:        Behavior: Behavior normal.        Judgment: Judgment normal.      LABORATORY DATA:  I have reviewed the data as listed Lab Results  Component Value Date   WBC 3.8 (L) 04/13/2024   HGB 8.5 (L) 04/13/2024   HCT 26.7 (L) 04/13/2024   MCV 93.7 04/13/2024   PLT 142 (L) 04/13/2024   Recent Labs    04/26/23 1407 04/29/23 1242 07/08/23 1312 09/09/23 1236 11/23/23 0831 04/13/24 1412  NA 140   < > 139 138 137 138  K 4.6   < > 4.5 4.2 3.7 4.0  CL 105   < > 105 106 103 104  CO2 22   < > 28 24 25 26   GLUCOSE 144*   < > 131* 131* 165* 171*  BUN 56*   < > 41* 45* 46* 61*  CREATININE 3.39*   < > 3.15* 3.20* 3.43* 4.68*  CALCIUM  8.8   < > 8.3* 8.3* 8.4* 8.2*  GFRNONAA  --    < > 20* 20* 18* 12*  PROT 5.6*  --  6.2* 6.0*  --   --   ALBUMIN 3.5*  --  3.4* 3.3*  --   --   AST 10  --  14* 14*  --   --   ALT 4  --  9 8  --   --   ALKPHOS 67  --  57 66  --   --   BILITOT <0.2  --  0.5 0.3  --   --    < > = values in this interval not displayed.     VAS US  UPPER EXT VEIN MAPPING (PRE-OP  AVF) Result Date: 04/03/2024 UPPER EXTREMITY VEIN MAPPING Patient Name:  Nicholas Nolan  Date of Exam:   03/30/2024 Medical Rec #: [id number]          Accession #:    [id number] Date of Birth: [birth year]         Patient Gender: M Patient Age:   1 years Exam Location:  [facility] Procedure:      VAS US  UPPER EXT VEIN MAPPING (PRE-OP  AVF) Referring Phys: Sharla Davis --------------------------------------------------------------------------------  Indications: Pre-access. Performing Technologist: Oneta Bilberry RVT  Examination Guidelines: A complete evaluation includes B-mode imaging, spectral Doppler, color Doppler, and power Doppler as needed of all accessible portions of each vessel. Bilateral testing is considered an integral part of a complete examination. Limited examinations for reoccurring indications may be performed as noted. +-----------------+-------------+----------+--------+ Right Cephalic   Diameter (cm)Depth (cm)Findings +-----------------+-------------+----------+--------+ Shoulder  0.28                        +-----------------+-------------+----------+--------+ Prox upper arm       0.30                        +-----------------+-------------+----------+--------+ Mid upper arm        0.31                        +-----------------+-------------+----------+--------+ Dist upper arm       0.35                        +-----------------+-------------+----------+--------+ Antecubital fossa    0.37                        +-----------------+-------------+----------+--------+ Prox forearm         0.53                        +-----------------+-------------+----------+--------+ Mid forearm          0.45                        +-----------------+-------------+----------+--------+ Dist forearm         0.40                        +-----------------+-------------+----------+--------+ +-----------------+-------------+----------+--------+ Right Basilic    Diameter (cm)Depth (cm)Findings +-----------------+-------------+----------+--------+ Prox upper arm       0.88                         +-----------------+-------------+----------+--------+ Mid upper arm        0.68                        +-----------------+-------------+----------+--------+ Dist upper arm       0.60                        +-----------------+-------------+----------+--------+ Antecubital fossa    0.58                        +-----------------+-------------+----------+--------+ Prox forearm         0.33                        +-----------------+-------------+----------+--------+ +-----------------+-------------+----------+--------+ Left Cephalic    Diameter (cm)Depth (cm)Findings +-----------------+-------------+----------+--------+ Shoulder             0.39                        +-----------------+-------------+----------+--------+ Prox upper arm       0.45                        +-----------------+-------------+----------+--------+ Mid upper arm        0.35                        +-----------------+-------------+----------+--------+ Dist upper arm       0.41                        +-----------------+-------------+----------+--------+ Antecubital fossa    0.44                        +-----------------+-------------+----------+--------+  Prox forearm         0.51                        +-----------------+-------------+----------+--------+ Mid forearm          0.39                        +-----------------+-------------+----------+--------+ Dist forearm         0.34                        +-----------------+-------------+----------+--------+ +-----------------+-------------+----------+--------+ Left Basilic     Diameter (cm)Depth (cm)Findings +-----------------+-------------+----------+--------+ Prox upper arm       0.37                        +-----------------+-------------+----------+--------+ Mid upper arm        0.43                        +-----------------+-------------+----------+--------+ Dist upper arm       0.44                         +-----------------+-------------+----------+--------+ Antecubital fossa    0.42                        +-----------------+-------------+----------+--------+ Prox forearm         0.37                        +-----------------+-------------+----------+--------+ Summary: Right: Patent cephalic and basilic veins with diameters as described        above. Left: Patent cephalic and basilic veins with diameters as described       above. *See table(s) above for measurements and observations.  Diagnosing physician: Mikki Alexander MD Electronically signed by Mikki Alexander MD on 04/03/2024 at 8:44:11 AM.    Final    VAS US  UPPER EXTREMITY ARTERIAL DUPLEX Result Date: 04/03/2024  UPPER EXTREMITY DUPLEX STUDY Patient Name:  Nicholas Nolan  Date of Exam:   03/30/2024 Medical Rec #: [id number]         Accession #:    [id number] Date of Birth: Dec 31, 1950         Patient Gender: M Patient Age:   54 years Exam Location:  [facility] Procedure:      VAS US  UPPER EXTREMITY ARTERIAL DUPLEX Referring Phys: Sharla Davis --------------------------------------------------------------------------------  Indications: Evaluation prior to dialysis placement.  Risk Factors: Hypertension, Diabetes, past history of smoking. Performing Technologist: Oneta Bilberry RVT  Examination Guidelines: A complete evaluation includes B-mode imaging, spectral Doppler, color Doppler, and power Doppler as needed of all accessible portions of each vessel. Bilateral testing is considered an integral part of a complete examination. Limited examinations for reoccurring indications may be performed as noted.  Right Doppler Findings: +-------------+----------+--------+--------+--------+ Site         PSV (cm/s)WaveformStenosisComments +-------------+----------+--------+--------+--------+ Brachial Dist110                                +-------------+----------+--------+--------+--------+ Radial Dist  101                                 +-------------+----------+--------+--------+--------+ Ulnar Dist  96                                 +-------------+----------+--------+--------+--------+   Right Pre-Dialysis Findings: +-----------------------+----------+--------------------+---------+--------+ Location               PSV (cm/s)Intralum. Diam. (cm)Waveform Comments +-----------------------+----------+--------------------+---------+--------+ Brachial Antecub. fossa110       0.65                triphasic         +-----------------------+----------+--------------------+---------+--------+ Radial Art at Wrist    101       0.35                triphasic         +-----------------------+----------+--------------------+---------+--------+ Ulnar Art at Wrist     96        0.27                triphasic         +-----------------------+----------+--------------------+---------+--------+  Left Doppler Findings: +-------------+----------+--------+--------+--------+ Site         PSV (cm/s)WaveformStenosisComments +-------------+----------+--------+--------+--------+ Brachial Dist101                                +-------------+----------+--------+--------+--------+ Radial Dist  94                                 +-------------+----------+--------+--------+--------+ Ulnar Dist   74                                 +-------------+----------+--------+--------+--------+   Left Pre-Dialysis Findings: +-----------------------+----------+--------------------+---------+--------+ Location               PSV (cm/s)Intralum. Diam. (cm)Waveform Comments +-----------------------+----------+--------------------+---------+--------+ Brachial Antecub. fossa101       0.66                triphasic         +-----------------------+----------+--------------------+---------+--------+ Radial Art at Wrist    94        0.33                triphasic          +-----------------------+----------+--------------------+---------+--------+ Ulnar Art at Wrist     74        0.38                triphasic         +-----------------------+----------+--------------------+---------+--------+  Summary:  Right: No obstruction visualized in the right upper extremity. Left: No obstruction visualized in the left upper extremity. *See table(s) above for measurements and observations. Electronically signed by Mikki Alexander MD on 04/03/2024 at 8:44:02 AM.    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 5 HOLD retacrit  today.   Proceed with venofer   today.   # Mild leukopenia white count 3.2 ANC-1.8; thrombocytopenia- platlets 144- Asymptomatic. Stable  # CKD stage IV- [DM/HTN; Dr.Korrapati]-follow-up- GFR 12- progressive worse- awaiting fistula- stable  # DM [Hb A1C-5.4]- trulicity/ SGLT2 inhbitors- stable  # CHF- chronic [Dr.Etang]-  stable  # 6 mm Left lung nodule; Prior Hx of Lung cancer Right lung [s/p Lobectomy- 2014? [facility]]-  APRIL 2025- CT chest   6 mm left-sided lung nodule- stable.  Recommend continued follow up in 12 months- in summer 2026-   Jan 2025-Q 2 w to 3 w  # DISPOSITION:  #  venofer  today; HOLD Retacrit  # Follow up in 3 weeks- labs- H&H-possible retacrit - # Follow up in 6 weeks- labs- H&H-possible retacrit -  # Follow up in 9 weeks- labs- H&H-possible retacrit -  # Follow up in 12 weeks- labs- H&H-possible retacrit -  # Follow up in 15 weeks- MD: labs- cbc;bmp; LDH; iron  studies; ferritin; possible venofer  OR retacrit ;  Dr.B     All questions were answered. The patient knows to call the clinic with any problems, questions or concerns.    Gwyn Leos, MD 04/13/2024 3:04  PM

## 2024-04-20 ENCOUNTER — Inpatient Hospital Stay

## 2024-04-20 DIAGNOSIS — D649 Anemia, unspecified: Secondary | ICD-10-CM

## 2024-04-20 DIAGNOSIS — I130 Hypertensive heart and chronic kidney disease with heart failure and stage 1 through stage 4 chronic kidney disease, or unspecified chronic kidney disease: Secondary | ICD-10-CM | POA: Diagnosis not present

## 2024-04-20 LAB — HEMOGLOBIN AND HEMATOCRIT (CANCER CENTER ONLY)
HCT: 25.6 % — ABNORMAL LOW (ref 39.0–52.0)
Hemoglobin: 8.1 g/dL — ABNORMAL LOW (ref 13.0–17.0)

## 2024-04-20 MED ORDER — EPOETIN ALFA-EPBX 40000 UNIT/ML IJ SOLN
40000.0000 [IU] | Freq: Once | INTRAMUSCULAR | Status: AC
  Administered 2024-04-20: 40000 [IU] via SUBCUTANEOUS
  Filled 2024-04-20: qty 1

## 2024-05-04 ENCOUNTER — Telehealth: Payer: Self-pay

## 2024-05-04 ENCOUNTER — Other Ambulatory Visit: Payer: Self-pay | Admitting: Family Medicine

## 2024-05-04 NOTE — Telephone Encounter (Signed)
 LOV 08/24/23 NOV 05/22/24 LRF 11/18/23 LABS 04/13/24

## 2024-05-04 NOTE — Telephone Encounter (Signed)
 Pt LM on triage line stating he was having urinary symptoms and thought he may be going back into retention.   Called pt back to access symptoms. No answer. Left detailed message advising pt to call back.

## 2024-05-10 ENCOUNTER — Other Ambulatory Visit: Payer: Self-pay | Admitting: Urology

## 2024-05-10 DIAGNOSIS — R972 Elevated prostate specific antigen [PSA]: Secondary | ICD-10-CM

## 2024-05-14 ENCOUNTER — Other Ambulatory Visit: Payer: Self-pay

## 2024-05-14 DIAGNOSIS — R972 Elevated prostate specific antigen [PSA]: Secondary | ICD-10-CM

## 2024-05-15 LAB — PSA: Prostate Specific Ag, Serum: 12.5 ng/mL — ABNORMAL HIGH (ref 0.0–4.0)

## 2024-05-18 ENCOUNTER — Other Ambulatory Visit: Payer: Self-pay | Admitting: Family Medicine

## 2024-05-18 ENCOUNTER — Ambulatory Visit: Payer: Self-pay | Admitting: Urology

## 2024-05-18 VITALS — BP 144/53 | HR 61 | Ht 70.0 in | Wt 155.0 lb

## 2024-05-18 DIAGNOSIS — R972 Elevated prostate specific antigen [PSA]: Secondary | ICD-10-CM | POA: Diagnosis not present

## 2024-05-18 DIAGNOSIS — R338 Other retention of urine: Secondary | ICD-10-CM

## 2024-05-18 DIAGNOSIS — N401 Enlarged prostate with lower urinary tract symptoms: Secondary | ICD-10-CM | POA: Diagnosis not present

## 2024-05-18 DIAGNOSIS — G4700 Insomnia, unspecified: Secondary | ICD-10-CM

## 2024-05-18 NOTE — Progress Notes (Unsigned)
 05/18/2024 10:08 AM   Nicholas Nolan 20-Feb-1951 [id number]  Referring provider: Cyndi Shaver, PA-C [address] [place],  [place] [address]  Chief Complaint  Patient presents with   Follow-up   Urologic history: 1.  Elevated PSA Prostate biopsy 2010 PSA 5.4; volume 64 cc; benign PSA 8.3 05/2016; MRI 103 cc volume, PI-RADS 3  MR fusion biopsy 08/2016, 82 cc volume, ROI and 12 core template benign PSA rise 16.8 10/2019; repeat PSA 12/2019 remains elevated 16.3 Repeat MRI 128 cc gland; contrast not given secondary to GFR 28 intermediate suspicious lesions seen in left PZ with mild capsular bulging and 2 other lesions NTZ MR fusion biopsy 02/2020; 139 cc gland; 4 biopsies each were taken of 3 ROI lesions in addition to a standard 12 core biopsy; all cores with benign prostate tissue and foci of acute/chronic inflammation   2.  BPH with history urinary retention Successful voiding trial On tamsulosin /finasteride   HPI: Nicholas Nolan is a 73 y.o. male presents for annual follow up.   PSA 05/14/2024 stable at 12.5 Remains on tamsulosin  with stable LUTS  No dysuria or gross hematuria. No flank, abdominal, or pelvic pain.  PSA trend  Prostate Specific Ag, Serum  Latest Ref Rng 0.0 - 4.0 ng/mL  11/06/2019 16.8 (H)   12/24/2019 16.3 (H)   10/14/2020 20.0 (H)   05/18/2021 9.9 (H)   11/23/2021 14.2 (H)   05/28/2022 8.4 (H)   12/06/2022 11.5 (H)   04/26/2023 13.1 (H)      PMH: Past Medical History:  Diagnosis Date   Arthritis    hands   Back pain    leg pain   CAD (coronary artery disease)    a. 10/2019 Cor CTA: LM nl, LAD nl, LCX distal dzs w/ FFR 0.65-0.75. RCA nl-->Med Rx.   CKD (chronic kidney disease), stage III (HCC)    Claudication (HCC)    Diabetes mellitus    type 2   Diastolic dysfunction    a. 08/2019 Echo: EF 60-65%, mild LVH, impaired relaxation. Nl RV size/fxn. Nl LA szie.   History of urinary retention    Hyperlipidemia    Hypertension     a. 05/2019 renal u/s concerning for R RAS; b. 06/2019 Renal artery angio: no more than15% bilat RAS.   Neuropathy    feet   Primary cancer of right upper lobe of lung (HCC) 2014   RUL Lobectomy   Symptomatic anemia    Wears dentures    full upper    Surgical History: Past Surgical History:  Procedure Laterality Date   BASAL CELL CARCINOMA EXCISION     CATARACT EXTRACTION W/PHACO Left 12/15/2023   Procedure: CATARACT EXTRACTION PHACO AND INTRAOCULAR LENS PLACEMENT (IOC) LEFT DIABETIC;  Surgeon: Enola Feliciano Hugger, MD;  Location: [facility];  Service: Ophthalmology;  Laterality: Left;  5.56 0:38.4   CATARACT EXTRACTION W/PHACO Right 01/09/2024   Procedure: CATARACT EXTRACTION PHACO AND INTRAOCULAR LENS PLACEMENT (IOC) RIGHT DIABETIC 6.18 00:40.1;  Surgeon: Enola Feliciano Hugger, MD;  Location: [facility];  Service: Ophthalmology;  Laterality: Right;   COLONOSCOPY WITH PROPOFOL  N/A 12/25/2019   Procedure: COLONOSCOPY WITH PROPOFOL ;  Surgeon: Unk Corinn Skiff, MD;  Location: [facility];  Service: Endoscopy;  Laterality: N/A;  Diabetic - injectable and oral meds   COLONOSCOPY WITH PROPOFOL  N/A 10/08/2021   Procedure: COLONOSCOPY WITH PROPOFOL ;  Surgeon: Therisa Bi, MD;  Location[facility];  Service: Gastroenterology;  Laterality: N/A;   LUMBAR  LAMINECTOMY/DECOMPRESSION MICRODISCECTOMY  01/10/2012   Procedure: LUMBAR LAMINECTOMY/DECOMPRESSION MICRODISCECTOMY 2 LEVELS;  Surgeon: Lamar LELON Peaches, MD;  Location: [facility] NEURO ORS;  Service: Neurosurgery;  Laterality: Bilateral;  Lumbar four-Sacral One laminectomies   LUNG SURGERY Right 11/22/12   right upper lobe   POLYPECTOMY N/A 12/25/2019   Procedure: POLYPECTOMY;  Surgeon: Unk Corinn Skiff, MD;  Location: [facility];  Service: Endoscopy;  Laterality: N/A;   PROSTATE SURGERY     biopsy-due to elelvated PSA   RENAL ANGIOGRAPHY Right 07/02/2019   Procedure: RENAL ANGIOGRAPHY;  Surgeon: Marea Selinda RAMAN, MD;   Location: [facility] INVASIVE CV LAB;  Service: Cardiovascular;  Laterality: Right;   SHOULDER SURGERY     VASECTOMY      Home Medications:  Allergies as of 05/18/2024       Reactions   Codeine Itching   And hyper also   Hydromorphone  Itching   Morphine  And Codeine Itching        Medication List        Accurate as of May 18, 2024 10:08 AM. If you have any questions, ask your nurse or doctor.          carvedilol  6.25 MG tablet Commonly known as: COREG  Take 1 tablet (6.25 mg total) by mouth 2 (two) times daily.   ezetimibe 10 MG tablet Commonly known as: ZETIA Take 1 tablet by mouth daily.   Farxiga 10 MG Tabs tablet Generic drug: dapagliflozin propanediol Take 10 mg by mouth every morning.   hydrALAZINE  100 MG tablet Commonly known as: APRESOLINE  Take 1 tablet (100 mg total) by mouth 3 (three) times daily.   IRON  PO Take by mouth daily.   isosorbide  mononitrate 30 MG 24 hr tablet Commonly known as: IMDUR  Take 1 tablet (30 mg total) by mouth 2 (two) times daily with a meal.   OneTouch Verio test strip Generic drug: glucose blood   rOPINIRole  0.5 MG tablet Commonly known as: REQUIP  TAKE 1 TABLET BY MOUTH EVERYDAY AT BEDTIME   tamsulosin  0.4 MG Caps capsule Commonly known as: FLOMAX  TAKE 1 CAPSULE BY MOUTH EVERY DAY   traZODone  50 MG tablet Commonly known as: DESYREL  TAKE 1/2 TO 1 TABLET BY MOUTH AT BEDTIME AS NEEDED FOR SLEEP   TRUEplus Lancets 28G Misc   Trulicity 1.5 MG/0.5ML Soaj Generic drug: Dulaglutide Inject into the skin once a week.        Allergies:  Allergies  Allergen Reactions   Codeine Itching    And hyper also   Hydromorphone  Itching   Morphine  And Codeine Itching    Family History: Family History  Problem Relation Age of Onset   Cancer Father        prostate   Heart disease Father        MI   Stroke Father    Cancer Brother        liver    Social History:  reports that he quit smoking about 11 years ago. His  smoking use included cigarettes. He started smoking about 56 years ago. He has a 90 pack-year smoking history. His smokeless tobacco use includes snuff. He reports that he does not drink alcohol and does not use drugs.   Physical Exam: BP (!) 144/53   Pulse 61   Ht 5' 10 (1.778 m)   Wt 155 lb (70.3 kg)   BMI 22.24 kg/m   Constitutional:  Alert and oriented, No acute distress. HEENT: [place] AT Respiratory: Normal respiratory effort, no increased work of breathing.  GU: Prostate 80+ cc, smooth without nodules or induration Psychiatric: Normal mood and affect.   Assessment & Plan:    1. BPH with LUTS  Stable LUTS on tamsulosin  I again discussed outlet procedures/HoLEP and he does not desire to pursue 1 year follow-up with PVR  2. Elevated PSA  We discussed his PSA will most likely increase since he has stopped finasteride   Lab visit 6 months with PSA 1 year office follow up with PSA   Glendia JAYSON Barba, MD  [facility] [address] [place], [place] [address] [phone]

## 2024-05-19 ENCOUNTER — Encounter: Payer: Self-pay | Admitting: Urology

## 2024-05-22 ENCOUNTER — Ambulatory Visit: Payer: Medicare PPO

## 2024-05-25 ENCOUNTER — Inpatient Hospital Stay: Attending: Internal Medicine

## 2024-05-25 ENCOUNTER — Inpatient Hospital Stay

## 2024-05-25 ENCOUNTER — Ambulatory Visit: Attending: Cardiology | Admitting: Cardiology

## 2024-05-25 VITALS — BP 126/48

## 2024-05-25 DIAGNOSIS — I130 Hypertensive heart and chronic kidney disease with heart failure and stage 1 through stage 4 chronic kidney disease, or unspecified chronic kidney disease: Secondary | ICD-10-CM | POA: Diagnosis present

## 2024-05-25 DIAGNOSIS — D649 Anemia, unspecified: Secondary | ICD-10-CM

## 2024-05-25 DIAGNOSIS — E1122 Type 2 diabetes mellitus with diabetic chronic kidney disease: Secondary | ICD-10-CM | POA: Insufficient documentation

## 2024-05-25 DIAGNOSIS — N184 Chronic kidney disease, stage 4 (severe): Secondary | ICD-10-CM | POA: Insufficient documentation

## 2024-05-25 DIAGNOSIS — D631 Anemia in chronic kidney disease: Secondary | ICD-10-CM | POA: Insufficient documentation

## 2024-05-25 LAB — HEMOGLOBIN AND HEMATOCRIT (CANCER CENTER ONLY)
HCT: 25.2 % — ABNORMAL LOW (ref 39.0–52.0)
Hemoglobin: 8.2 g/dL — ABNORMAL LOW (ref 13.0–17.0)

## 2024-05-25 MED ORDER — EPOETIN ALFA-EPBX 40000 UNIT/ML IJ SOLN
40000.0000 [IU] | Freq: Once | INTRAMUSCULAR | Status: AC
  Administered 2024-05-25: 40000 [IU] via SUBCUTANEOUS
  Filled 2024-05-25: qty 1

## 2024-06-11 ENCOUNTER — Telehealth (INDEPENDENT_AMBULATORY_CARE_PROVIDER_SITE_OTHER): Payer: Self-pay

## 2024-06-11 NOTE — Telephone Encounter (Signed)
 I attempted to contact the patient to schedule him for a left radialcaphalic AV fistula with Dr. Jama. A message was left for a return call.

## 2024-06-15 ENCOUNTER — Other Ambulatory Visit: Payer: Self-pay | Admitting: Cardiology

## 2024-06-15 ENCOUNTER — Inpatient Hospital Stay: Attending: Internal Medicine

## 2024-06-15 ENCOUNTER — Ambulatory Visit

## 2024-06-15 VITALS — BP 152/50

## 2024-06-15 DIAGNOSIS — D631 Anemia in chronic kidney disease: Secondary | ICD-10-CM | POA: Insufficient documentation

## 2024-06-15 DIAGNOSIS — I130 Hypertensive heart and chronic kidney disease with heart failure and stage 1 through stage 4 chronic kidney disease, or unspecified chronic kidney disease: Secondary | ICD-10-CM | POA: Diagnosis present

## 2024-06-15 DIAGNOSIS — D649 Anemia, unspecified: Secondary | ICD-10-CM

## 2024-06-15 DIAGNOSIS — E1122 Type 2 diabetes mellitus with diabetic chronic kidney disease: Secondary | ICD-10-CM | POA: Diagnosis present

## 2024-06-15 DIAGNOSIS — N184 Chronic kidney disease, stage 4 (severe): Secondary | ICD-10-CM | POA: Insufficient documentation

## 2024-06-15 LAB — HEMOGLOBIN AND HEMATOCRIT (CANCER CENTER ONLY)
HCT: 28.1 % — ABNORMAL LOW (ref 39.0–52.0)
Hemoglobin: 9 g/dL — ABNORMAL LOW (ref 13.0–17.0)

## 2024-06-15 MED ORDER — EPOETIN ALFA-EPBX 40000 UNIT/ML IJ SOLN
40000.0000 [IU] | Freq: Once | INTRAMUSCULAR | Status: AC
  Administered 2024-06-15: 40000 [IU] via SUBCUTANEOUS
  Filled 2024-06-15: qty 1

## 2024-06-15 NOTE — Telephone Encounter (Signed)
 Please contact pt for future appointment pt due for 3 month f/u.

## 2024-06-21 ENCOUNTER — Telehealth (INDEPENDENT_AMBULATORY_CARE_PROVIDER_SITE_OTHER): Payer: Self-pay

## 2024-06-21 NOTE — Telephone Encounter (Signed)
 Patient called back stating that he was not going to have the left radialcephalic fistula placement and that he was going to have PD dialysis instead.

## 2024-06-21 NOTE — Telephone Encounter (Signed)
 I attempted to return the patient's call. A message was left for a return call.

## 2024-07-06 ENCOUNTER — Inpatient Hospital Stay

## 2024-07-11 NOTE — H&P (Signed)
 Assessment & Plan 1. End Stage Renal Disease (Stage V). His GFR has decreased to 12, indicating a need for dialysis. He is not currently on dialysis but is being considered for peritoneal dialysis. The benefits of peritoneal dialysis over hemodialysis were discussed, including its gentler nature and better outcomes for patients with heart failure. The procedure for peritoneal dialysis catheter placement was explained in detail. He was advised to maintain a daily bowel regimen to prevent constipation, which could interfere with the catheter's function. This regimen includes taking stool softeners in the morning and at night, consuming prune juice midday, and using Metamucil fiber powder. He was also instructed to ensure the catheter is flushed at least twice a week until it is used daily.  Thus we discussed laparoscopic peritoneal dialysis catheter placement, with omentopexy, risk and benefits, all questions answered.  Patient understands agrees, wishes to proceed.  Will schedule today.  2. Type 2 Diabetes Mellitus. His diabetes is well-controlled with a hemoglobin A1c of 4.7 as of 01/2024. He should continue his current diabetes management plan and follow up with his endocrinologist as scheduled.  3. Hypertension. His hypertension is managed with amlodipine , carvedilol , hydralazine , and isosorbide . Blood pressure control is crucial to prevent further kidney damage. He should continue his current antihypertensive regimen and monitor his blood pressure regularly.  4. Anemia of Chronic Kidney Disease. His hemoglobin level was 10.1 in 04/2024, which is stable. He is currently taking iron  tablets daily and receives occasional iron  infusions. This condition may improve with the initiation of peritoneal dialysis.  5. Congestive Heart Failure. He has mild congestive heart failure, which is being monitored by his cardiologist. He should continue his current heart failure management plan and attend all  scheduled cardiology appointments.  6. Constipation. He was advised to maintain a daily bowel regimen to prevent constipation, which could interfere with the catheter's function. This regimen includes taking stool softeners in the morning and at night, consuming prune juice midday, and using Metamucil fiber powder.    I have personally spent 45 minutes involved in face-to-face and non-face-to-face activities for this patient on the day of the visit. Professional time spent includes the following activities, in addition to those noted in the documentation: Reviewing notes prior to visit, reviewing all available labs & imaging, then discussing the case with the patient, performing a physical exam, and finally discussing surgical options, postoperative care/diet/management, etc., followed by documentation, completing notes on time and placing any relevant orders.  Computer technology was used to create this visit note.  Consent from patient and/or caregiver was obtained prior to its use   History of Present Illness The patient is here for a potential peritoneal dialysis catheter. His nephrologist is Dr. Dennise. He has multiple medical problems including end-stage renal disease stage V, not currently on dialysis. This is secondary to his type 2 diabetes and hypertension. He also has a history of significant coronary artery disease (CAD), congestive heart failure (CHF), and peripheral vascular disease. His appetite is reportedly okay, and his weight is fairly unchanged. There is no swelling or leg edema. However, his labs show his GFR continues to fall, and in 03/2024, his GFR was down to 12, leading to this referral for peritoneal dialysis.  His last hemoglobin A1c in 01/2024 was 4.7, indicating well-controlled diabetes. He sees an endocrinologist at the clinic. For his anemia of chronic kidney disease, his last hemoglobin was 10.1 in 04/2024. He uses snuff and chews tobacco and has discussed quitting. He  is a former  cigarette smoker, having quit 11 years ago. His hypertension is controlled with amlodipine , carvedilol , hydralazine , and isosorbide . He has some atherosclerotic disease monitored by cardiology, along with his CAD and CHF. A renal artery ultrasound in 2021 was negative for renal artery stenosis. He is being worked up for a transplant at [facility] and has an echo and stress test pending. A CT abdomen and pelvis during his transplant workup showed some calcified aortoiliac atherosclerosis and chronic incidental findings but no acute issues or abnormalities.  The patient's GFR has decreased to 12. He reports no shortness of breath. He is considering home dialysis as an alternative to visiting a dialysis center 3 to 4 times a week. He has no history of heart attacks or cardiac stents. He has never undergone abdominal surgery and does not take any blood thinners.  He has mild congestive heart failure, which is under close observation. His blood pressure is well-managed with medication. He occasionally experiences constipation. He has anemia and takes iron  tablets daily, occasionally requiring iron  infusions.  He had to reschedule his cardiac stress test yesterday because his blood pressure was high at 225, which came down to 190 after taking his medications. He was advised not to take any medication beforehand, so he went all night and that day without medication, resulting in high blood pressure.  SOCIAL HISTORY Marital Status: Recently married. Alcohol: Does not drink alcohol. Tobacco: Dips snuff and chews tobacco. Former cigarette smoker, quit 11 years ago after smoking 1.5 to 2 packs a day for approximately 40 years.    Past Medical History Past Medical History:  Diagnosis Date  . Carotid stenosis   . CHF (congestive heart failure) (*)   . Chronic kidney disease   . Coronary artery disease   . Diabetes mellitus (*)   . ESRD (end stage renal disease) (*)   . Hyperlipidemia   . Hypertension    . Lung cancer (*) 2014   primary rul  . Neuropathy     Past Surgical History Past Surgical History:  Procedure Laterality Date  . Back surgery  01/10/2012   LAMINECTOMY/DECOMPRESSION MICRODISCECTOMY  . Basil cell carcinoma    . Colonoscopy    . Eye surgery    . Lung lobectomy  2014   RUL  . Prostate surgery    . Shoulder surgery    . Vasectomy      Allergies Codeine, Hydromorphone , and Morphine  and related  Medications Current Medications[1]  Family History No family history on file.  Social History: Social History[2]  Review of Systems: A complete review of systems was performed; pertinent positives are mentioned in the HPI, otherwise negative.  There were no vitals filed for this visit.   Results Labs  - GFR: 03/2024, 12  - Hemoglobin A1c: 01/2024, 4.7  - Hemoglobin: 04/2024, 10.1  Imaging  - CT abdomen and pelvis: Calcified aortoiliac atherosclerosis and some chronic incidental findings, but no acute issues or abnormalities noted.    Physical Examination: General appearance - alert, well appearing, and in no distress Mental status - alert, oriented to person, place, and time Chest - clear to auscultation bilaterally Heart - normal rate, regular rhythm Abdomen -soft nontender nondistended no guarding or rebound Physical Exam Gastrointestinal: Abdomen is non-tender to palpation. Extremities: No leg edema.   Neurological - alert, oriented, normal speech, no focal findings or movement disorder noted Musculoskeletal - no joint tenderness, deformity or swelling Extremities - peripheral pulses normal, no pedal edema, no clubbing or cyanosis Skin - normal  coloration and turgor, no rashes, no suspicious skin lesions noted       [1] No current facility-administered medications for this encounter.   Current Outpatient Medications  Medication Sig Dispense Refill  . acetaminophen  (TYLENOL ) 500 mg tablet Take one tablet (500 mg dose) by mouth daily as  needed.    . carvedilol  (COREG ) 6.25 mg tablet Take one tablet (6.25 mg dose) by mouth 2 (two) times daily.    SABRA ezetimibe (ZETIA) 10 MG tablet Take one tablet (10 mg dose) by mouth daily.    SABRA FARXIGA 10 MG tablet Take one tablet (10 mg dose) by mouth every morning.    . hydrALAzine  HCl (APRESOLINE ) 100 mg tablet Take one tablet (100 mg dose) by mouth 3 (three) times a day.    . isosorbide  mononitrate (IMDUR ) 30 mg 24 hr tablet Take one tablet (30 mg dose) by mouth 2 (two) times a day with meals.    . ropinirole  (REQUIP ) 0.5 MG tablet Take one tablet (0.5 mg dose) by mouth at bedtime.    . traZODone  (DESYREL ) 50 mg tablet Take one half tablet to one tablet (25-50 mg dose) by mouth.    SABRA BREEN METRIX BLOOD GLUCOSE TEST test strip SMARTSIG:Via Meter    [2] Social History Socioeconomic History  . Marital status: Married  Tobacco Use  . Smoking status: Former    Types: Cigarettes    Passive exposure: Never  . Smokeless tobacco: Current    Types: Snuff  Vaping Use  . Vaping status: Never Used

## 2024-07-13 HISTORY — PX: OTHER SURGICAL HISTORY: SHX169

## 2024-07-13 NOTE — Interval H&P Note (Signed)
 H&P reviewed, patient examined and there is NO CHANGE to the patient status.

## 2024-07-13 NOTE — Anesthesia Postprocedure Evaluation (Signed)
  Patient: Nicholas Nolan Procedure(s): LAPAROSCOPIC PERITONEAL DIALYSIS CATHETER INSERTION WITH OMENTOPEXY  Anesthesia type: general  Anesthesia Post Evaluation  Patient location during evaluation: PACU Patient participation: complete - patient participated Level of consciousness: awake and alert Pain management: adequate Airway patency: patent Cardiovascular status: acceptable Respiratory status: acceptable Hydration status: acceptable Vitals: stable Temperature: temperature adequate >96.34F PONV: nausea and vomiting controlled Regional Anesthesia: no block performed    BP: (!) 179/77 (07/13/24 1401) Heart Rate: 69 (07/13/24 1401) Resp: 13 (07/13/24 1401) Temp: 98 F (36.7 C) (07/13/24 1358) SpO2: 99 % (07/13/24 1401) Weight: 70.7 kg (155 lb 14.4 oz) (07/13/24 1140) BMI (Calculated): 22.4 (07/13/24 1140)     Notable Events:    No Anesthesia notable events documented.

## 2024-07-15 NOTE — Progress Notes (Unsigned)
 Cardiology Clinic Note   Date: 07/17/2024 ID: Nicholas Nolan, DOB December 15, 1950, MRN [id number]  Primary Cardiologist:  Redell Cave, MD  Chief Complaint   Nicholas Nolan is a 73 y.o. male who presents to the clinic today for routine follow up.   Patient Profile   Nicholas Nolan is followed by Dr Cave for the history outlined below.      Past medical history significant for: CAD Coronary CTA with FFR 10/11/2019: Coronary calcium  score 1689.  Severe calcific plaques in the mid LAD and RCA.  FFR analysis showed significant stenosis of the distal segment of the LCx which will is a small vessel <2 mm in diameter.  Recommend GDMT. Chronic DOE. Echo 08/24/2019: EF 60 to 65%.  No RWMA.  Mild LVH.  Grade I DD.  Normal RV size/function. Hypertension. Renal artery duplex 06/01/2019: Elevated peak systolic velocity in the mid segment of the right renal artery concerning for hemodynamically significant renal artery stenosis.  No evidence of significant left renal artery stenosis. Renal angiography 07/02/2019: Relatively normal aorta and iliac arteries without significant stenosis.  Neither renal artery more than about 15% stenosis on selective imaging. Hyperlipidemia. Lipid panel 05/16/2024: LDL 85, HDL 55, TG 134, total 160. PAD. LEA US  08/11/2020: Right: Atherosclerosis of the common femoral, femoral, and popliteal and tibial arteries.  Elevated velocities throughout the right lower extremity with 75 to 99% stenosis in the mid SFA.  Left: Atherosclerosis of the common femoral, femoral, and popliteal and tibial arteries.  Elevated velocities throughout the left lower extremity with a short segment occlusion in the mid SFA which reconstitutes mid/distal SFA. ABI 08/10/2021: Moderate bilateral lower extremity arterial disease. Carotid artery stenosis.  Carotid duplex 08/10/2021: Bilateral ICA 40 to 59%, bilateral ECA > 50%. OSA. Lung cancer. Hypothyroidism. T2DM. ESRD. S/p PD catheter  placement 07/13/2024.  In summary, patient was first evaluated by Dr. Cave on 07/24/2019 for resistant hypertension and DOE.  Echo demonstrated normal LV/RV function as detailed above.  Renal artery duplex was concerning for right renal artery stenosis.  Patient was referred to vascular surgery and underwent renal angiography in August 2021 which demonstrated no significant renal artery artery stenosis as detailed above.  Coronary CTA with FFR in December 2020 demonstrated significant stenosis of the distal LCx which was a small vessel as detailed above, GDMT was recommended.  Patient was last seen in the office by Dr. Cave on 08/05/2023 for routine follow-up.  Patient reported recent elevation of BP with SBP in the 180s.  He increased hydralazine  to 100 mg 3 times daily with improved SBP to the 130s.  BP at the time of his visit was 138/48 patient was cautioned to not titrate medications further due to low DBP.   Patient was seen in the office on 01/24/2024 for evaluation of elevated BP. Home BP readings were consistently 160-180/60. BP at the time of his visit was 172/58 on intake and 160/60 on my recheck. Isosorbide  was added.  Upon follow-up on 02/23/2024, patient reported BP in the afternoon was well-controlled.  He noted sky high readings of 160-190/70s in the evenings.  BP at the time of his visit was well-controlled at 118/60.  Isosorbide  was increased to 30 mg.     History of Present Illness    Today, patient reports he is doing well. Patient denies shortness of breath, dyspnea on exertion, lower extremity edema, orthopnea or PND. No chest pain, pressure, or tightness. No palpitations.  He had his PD catheter placed  last week. He has not started PD dialysis yet but believes he and his wife have a class coming up to learn how to do it. He has some abdominal soreness from the procedure. He reports BP was trending up so nephrology added amlodipine  to his regimen with improvement. He is no  longer having elevated BP in the evenings. He continues to undergo workup at [facility] for kidney transplant.     ROS: All other systems reviewed and are otherwise negative except as noted in History of Present Illness.  EKGs/Labs Reviewed    EKG Interpretation Date/Time:  Tuesday July 17 2024 14:01:57 EDT Ventricular Rate:  56 PR Interval:  114 QRS Duration:  96 QT Interval:  476 QTC Calculation: 459 R Axis:   40  Text Interpretation: Sinus bradycardia When compared with ECG of 05-Aug-2023 13:38, No significant change was found Confirmed by Loistine Sober [phone]) on 07/17/2024 2:15:03 PM   09/09/2023: ALT 8; AST 14 04/13/2024: BUN 61; Creatinine, Ser 4.68; Potassium 4.0; Sodium 138   04/13/2024: WBC Count 3.8 06/15/2024: Hemoglobin 9.0    Physical Exam    VS:  BP (!) 138/56 (BP Location: Left Arm, Patient Position: Sitting, Cuff Size: Normal)   Pulse (!) 56   Ht 5' 9.5 (1.765 m)   Wt 162 lb 9.6 oz (73.8 kg)   SpO2 98%   BMI 23.67 kg/m  , BMI Body mass index is 23.67 kg/m.  GEN: Well nourished, well developed, in no acute distress. Neck: No JVD or carotid bruits. Cardiac:  RRR.  No murmur. No rubs or gallops.   Respiratory:  Respirations regular and unlabored. Clear to auscultation without rales, wheezing or rhonchi. GI: Soft, nontender, nondistended. Extremities: Radials/DP/PT 2+ and equal bilaterally. No clubbing or cyanosis. No edema.  Skin: Warm and dry, no rash. Neuro: Strength intact.  Assessment & Plan   CAD Coronary CTA with FFR December 2020 showed calcium  score of 1689, FFR analysis showed significant stenosis of the distal segment of the LCx which was a small vessel, GDMT was recommended.  Patient denies chest pain, pressure or tightness. - Continue amlodipine , carvedilol , isosorbide .    Hypertension BP today 144/55 on intake and 138/56 on my recheck. Home BP has been well controlled. Medications are limited secondary to CKD stage IV and low DBP.    -Continue amlodipine , carvedilol , hydralazine , isosorbide .   Carotid artery stenosis Carotid duplex October 2022 showed bilateral ICA 40 to 59%, bilateral ECA > 50%. Patient denies lightheadedness, dizziness, vision changes, presyncope or syncope. -Continue Zetia.   Hyperlipidemia LDL 85 July 2025, improved but not at goal. -Continue Zetia.  ESRD Patient is undergoing workup for kidney transplant at [facility].  Patient underwent PD catheter placement on 07/13/2024.  He reports he and his wife have a class coming up to learn how to do PD dialysis. His abdomen is slightly sore from the procedure. He does still make urine.  - Continue to follow with nephrology.  Disposition: Return in 6 months or sooner as needed.          Signed, Sober HERO. Nicholis Stepanek, DNP, NP-C

## 2024-07-17 ENCOUNTER — Ambulatory Visit: Attending: Student | Admitting: Student

## 2024-07-17 ENCOUNTER — Encounter: Payer: Self-pay | Admitting: Student

## 2024-07-17 VITALS — BP 138/56 | HR 56 | Ht 69.5 in | Wt 162.6 lb

## 2024-07-17 DIAGNOSIS — I6523 Occlusion and stenosis of bilateral carotid arteries: Secondary | ICD-10-CM | POA: Diagnosis not present

## 2024-07-17 DIAGNOSIS — E785 Hyperlipidemia, unspecified: Secondary | ICD-10-CM | POA: Diagnosis not present

## 2024-07-17 DIAGNOSIS — I2510 Atherosclerotic heart disease of native coronary artery without angina pectoris: Secondary | ICD-10-CM

## 2024-07-17 DIAGNOSIS — N186 End stage renal disease: Secondary | ICD-10-CM

## 2024-07-17 DIAGNOSIS — I10 Essential (primary) hypertension: Secondary | ICD-10-CM | POA: Diagnosis not present

## 2024-07-17 NOTE — Patient Instructions (Signed)
 Medication Instructions:  Your physician recommends that you continue on your current medications as directed. Please refer to the Current Medication list given to you today.   *If you need a refill on your cardiac medications before your next appointment, please call your pharmacy*  Lab Work: None ordered at this time  If you have labs (blood work) drawn today and your tests are completely normal, you will receive your results only by: MyChart Message (if you have MyChart) OR A paper copy in the mail If you have any lab test that is abnormal or we need to change your treatment, we will call you to review the results.  Testing/Procedures: None ordered at this time   Follow-Up: At [facility], you and your health needs are our priority.  As part of our continuing mission to provide you with exceptional heart care, our providers are all part of one team.  This team includes your primary Cardiologist (physician) and Advanced Practice Providers or APPs (Physician Assistants and Nurse Practitioners) who all work together to provide you with the care you need, when you need it.  Your next appointment:   6 month(s)  Provider:   Redell Cave, MD or Barnie Hila, NP    We recommend signing up for the patient portal called MyChart.  Sign up information is provided on this After Visit Summary.  MyChart is used to connect with patients for Virtual Visits (Telemedicine).  Patients are able to view lab/test results, encounter notes, upcoming appointments, etc.  Non-urgent messages can be sent to your provider as well.   To learn more about what you can do with MyChart, go to [email or web address].

## 2024-07-27 ENCOUNTER — Telehealth: Payer: Self-pay | Admitting: Internal Medicine

## 2024-07-27 NOTE — Telephone Encounter (Signed)
 He called to reschedule his appointment from 9/15 to after 9/30 due to dialysis training until 1 pm everyday. His appointment is scheduled for 10/13. He needed ; possible venofer  OR retacrit . Since his appointment is rescheduled so far out, does he need to come for lab prior?

## 2024-07-30 ENCOUNTER — Inpatient Hospital Stay

## 2024-07-30 ENCOUNTER — Other Ambulatory Visit: Attending: Internal Medicine

## 2024-07-30 ENCOUNTER — Inpatient Hospital Stay: Admitting: Internal Medicine

## 2024-08-08 ENCOUNTER — Telehealth: Payer: Self-pay | Admitting: Internal Medicine

## 2024-08-08 NOTE — Telephone Encounter (Signed)
 Pt called and stated he is getting iron  with his dialysis and doesnt need to come here anymore for iron . Wants all appts here to be canceled.  Appts are canceled.

## 2024-08-20 ENCOUNTER — Ambulatory Visit: Admitting: Nurse Practitioner

## 2024-08-20 ENCOUNTER — Other Ambulatory Visit

## 2024-08-20 ENCOUNTER — Ambulatory Visit

## 2024-09-12 ENCOUNTER — Other Ambulatory Visit: Payer: Self-pay | Admitting: Cardiology

## 2024-10-09 ENCOUNTER — Other Ambulatory Visit: Payer: Self-pay | Admitting: Family Medicine

## 2024-10-09 DIAGNOSIS — G4700 Insomnia, unspecified: Secondary | ICD-10-CM

## 2024-11-02 ENCOUNTER — Other Ambulatory Visit: Payer: Self-pay | Admitting: Family Medicine

## 2024-11-03 ENCOUNTER — Other Ambulatory Visit: Payer: Self-pay | Admitting: Family Medicine

## 2024-11-03 DIAGNOSIS — I152 Hypertension secondary to endocrine disorders: Secondary | ICD-10-CM

## 2024-11-13 ENCOUNTER — Other Ambulatory Visit: Payer: Self-pay | Admitting: Family Medicine

## 2024-11-19 ENCOUNTER — Other Ambulatory Visit

## 2024-11-19 DIAGNOSIS — N401 Enlarged prostate with lower urinary tract symptoms: Secondary | ICD-10-CM

## 2024-11-20 ENCOUNTER — Ambulatory Visit: Payer: Self-pay | Admitting: Urology

## 2024-11-20 LAB — PSA: Prostate Specific Ag, Serum: 13.2 ng/mL — ABNORMAL HIGH (ref 0.0–4.0)

## 2024-12-12 ENCOUNTER — Telehealth: Payer: Self-pay

## 2024-12-12 ENCOUNTER — Other Ambulatory Visit: Payer: Self-pay | Admitting: Cardiology

## 2024-12-12 DIAGNOSIS — N401 Enlarged prostate with lower urinary tract symptoms: Secondary | ICD-10-CM

## 2024-12-12 MED ORDER — FINASTERIDE 5 MG PO TABS: 5.0000 mg | ORAL_TABLET | Freq: Every day | ORAL | 11 refills | Status: AC

## 2024-12-12 NOTE — Telephone Encounter (Signed)
 Finasteride  sent to [facility] in [place].  Please offer him an office visit with me in 1 to 2 weeks for UA and bladder scan.

## 2024-12-12 NOTE — Telephone Encounter (Signed)
 PT lm on triage line-   Returning Lyns call.

## 2024-12-12 NOTE — Telephone Encounter (Signed)
 LVM for pt to give us  a call back about his symptoms after having a catheter placed in the ER on 1/31

## 2024-12-12 NOTE — Addendum Note (Signed)
 Addended by: MAURINE SHEPPARD SQUIBB on: 12/12/2024 05:07 PM   Modules accepted: Orders

## 2024-12-12 NOTE — Telephone Encounter (Signed)
 Called pt to return after hours call to triage line. Pt stated that they had removed catheter because he was in so much pain with it, bladder spasms and leaking. Pt states being able to void fine. Pt did ask if he can restart his finasteride  because when he was on that he had great results. He is requesting a new Rx for that. I let him know I would see if we could do that for him.

## 2024-12-13 NOTE — Telephone Encounter (Signed)
 Called patient and left message for patient to call back

## 2024-12-14 NOTE — Telephone Encounter (Signed)
 Called patient and let patient know that sam had sent in Finasteride  to [facility] in [place]. I also got him scheduled for appt on 12/27/24 at 3:20pm

## 2024-12-27 ENCOUNTER — Ambulatory Visit: Admitting: Physician Assistant

## 2025-05-21 ENCOUNTER — Ambulatory Visit: Admitting: Urology
